# Patient Record
Sex: Male | Born: 1945 | Race: White | Hispanic: No | Marital: Married | Smoking: Former smoker
Health system: Southern US, Community
[De-identification: ages and names within clinical notes are randomized; demographics above are authoritative.]

## PROBLEM LIST (undated history)

## (undated) DIAGNOSIS — I10 Essential (primary) hypertension: Secondary | ICD-10-CM

## (undated) DIAGNOSIS — C801 Malignant (primary) neoplasm, unspecified: Secondary | ICD-10-CM

## (undated) DIAGNOSIS — Z720 Tobacco use: Secondary | ICD-10-CM

## (undated) DIAGNOSIS — E669 Obesity, unspecified: Secondary | ICD-10-CM

## (undated) DIAGNOSIS — N200 Calculus of kidney: Secondary | ICD-10-CM

## (undated) DIAGNOSIS — G459 Transient cerebral ischemic attack, unspecified: Secondary | ICD-10-CM

## (undated) DIAGNOSIS — E119 Type 2 diabetes mellitus without complications: Secondary | ICD-10-CM

## (undated) DIAGNOSIS — I472 Ventricular tachycardia: Secondary | ICD-10-CM

## (undated) DIAGNOSIS — M545 Low back pain: Secondary | ICD-10-CM

## (undated) DIAGNOSIS — D494 Neoplasm of unspecified behavior of bladder: Secondary | ICD-10-CM

## (undated) DIAGNOSIS — R001 Bradycardia, unspecified: Secondary | ICD-10-CM

## (undated) DIAGNOSIS — K219 Gastro-esophageal reflux disease without esophagitis: Secondary | ICD-10-CM

## (undated) DIAGNOSIS — G8929 Other chronic pain: Secondary | ICD-10-CM

## (undated) DIAGNOSIS — E785 Hyperlipidemia, unspecified: Secondary | ICD-10-CM

## (undated) DIAGNOSIS — I779 Disorder of arteries and arterioles, unspecified: Secondary | ICD-10-CM

## (undated) DIAGNOSIS — G9340 Encephalopathy, unspecified: Secondary | ICD-10-CM

## (undated) DIAGNOSIS — I69398 Other sequelae of cerebral infarction: Secondary | ICD-10-CM

## (undated) DIAGNOSIS — I2510 Atherosclerotic heart disease of native coronary artery without angina pectoris: Secondary | ICD-10-CM

## (undated) DIAGNOSIS — Z8719 Personal history of other diseases of the digestive system: Secondary | ICD-10-CM

## (undated) DIAGNOSIS — Z9289 Personal history of other medical treatment: Secondary | ICD-10-CM

## (undated) DIAGNOSIS — G40909 Epilepsy, unspecified, not intractable, without status epilepticus: Secondary | ICD-10-CM

## (undated) DIAGNOSIS — I639 Cerebral infarction, unspecified: Secondary | ICD-10-CM

## (undated) DIAGNOSIS — I739 Peripheral vascular disease, unspecified: Secondary | ICD-10-CM

## (undated) DIAGNOSIS — I2119 ST elevation (STEMI) myocardial infarction involving other coronary artery of inferior wall: Secondary | ICD-10-CM

## (undated) DIAGNOSIS — R000 Tachycardia, unspecified: Secondary | ICD-10-CM

## (undated) HISTORY — DX: Personal history of other medical treatment: Z92.89

## (undated) HISTORY — DX: Ventricular tachycardia: I47.2

## (undated) HISTORY — DX: Essential (primary) hypertension: I10

## (undated) HISTORY — DX: Tachycardia, unspecified: R00.0

## (undated) HISTORY — PX: CORONARY ANGIOPLASTY WITH STENT PLACEMENT: SHX49

## (undated) HISTORY — DX: Disorder of arteries and arterioles, unspecified: I77.9

## (undated) HISTORY — PX: TRANSURETHRAL RESECTION OF BLADDER TUMOR WITH GYRUS (TURBT-GYRUS): SHX6458

## (undated) HISTORY — DX: Tobacco use: Z72.0

## (undated) HISTORY — PX: CARDIAC CATHETERIZATION: SHX172

## (undated) HISTORY — DX: Neoplasm of unspecified behavior of bladder: D49.4

## (undated) HISTORY — DX: Epilepsy, unspecified, not intractable, without status epilepticus: G40.909

## (undated) HISTORY — DX: Obesity, unspecified: E66.9

## (undated) HISTORY — DX: Peripheral vascular disease, unspecified: I73.9

## (undated) HISTORY — DX: Other sequelae of cerebral infarction: I69.398

## (undated) HISTORY — DX: Type 2 diabetes mellitus without complications: E11.9

## (undated) HISTORY — DX: Hyperlipidemia, unspecified: E78.5

## (undated) HISTORY — DX: Gastro-esophageal reflux disease without esophagitis: K21.9

---

## 1992-01-13 DIAGNOSIS — I2119 ST elevation (STEMI) myocardial infarction involving other coronary artery of inferior wall: Secondary | ICD-10-CM

## 1992-01-13 HISTORY — DX: ST elevation (STEMI) myocardial infarction involving other coronary artery of inferior wall: I21.19

## 2000-02-02 ENCOUNTER — Encounter: Payer: Self-pay | Admitting: Cardiology

## 2000-02-02 ENCOUNTER — Inpatient Hospital Stay (HOSPITAL_COMMUNITY): Admission: AD | Admit: 2000-02-02 | Discharge: 2000-02-08 | Payer: Self-pay | Admitting: Cardiology

## 2000-11-11 ENCOUNTER — Ambulatory Visit (HOSPITAL_COMMUNITY): Admission: RE | Admit: 2000-11-11 | Discharge: 2000-11-11 | Payer: Self-pay | Admitting: *Deleted

## 2000-12-07 ENCOUNTER — Ambulatory Visit (HOSPITAL_COMMUNITY): Admission: RE | Admit: 2000-12-07 | Discharge: 2000-12-08 | Payer: Self-pay | Admitting: Cardiology

## 2003-01-18 ENCOUNTER — Inpatient Hospital Stay (HOSPITAL_COMMUNITY): Admission: EM | Admit: 2003-01-18 | Discharge: 2003-01-20 | Payer: Self-pay | Admitting: Emergency Medicine

## 2004-06-10 ENCOUNTER — Ambulatory Visit: Payer: Self-pay | Admitting: Cardiology

## 2005-03-11 ENCOUNTER — Inpatient Hospital Stay (HOSPITAL_COMMUNITY): Admission: EM | Admit: 2005-03-11 | Discharge: 2005-03-14 | Payer: Self-pay | Admitting: Emergency Medicine

## 2005-03-11 ENCOUNTER — Ambulatory Visit: Payer: Self-pay | Admitting: Cardiovascular Disease

## 2005-03-19 ENCOUNTER — Ambulatory Visit: Payer: Self-pay | Admitting: Cardiology

## 2005-05-28 ENCOUNTER — Ambulatory Visit: Payer: Self-pay | Admitting: Cardiology

## 2005-12-24 ENCOUNTER — Ambulatory Visit: Payer: Self-pay | Admitting: Cardiology

## 2006-04-13 ENCOUNTER — Encounter: Payer: Self-pay | Admitting: Internal Medicine

## 2006-04-13 ENCOUNTER — Ambulatory Visit: Payer: Self-pay

## 2006-04-26 ENCOUNTER — Ambulatory Visit: Payer: Self-pay | Admitting: Cardiology

## 2006-12-02 ENCOUNTER — Ambulatory Visit: Payer: Self-pay | Admitting: Cardiology

## 2007-07-07 ENCOUNTER — Ambulatory Visit: Payer: Self-pay | Admitting: Cardiology

## 2008-03-16 ENCOUNTER — Ambulatory Visit: Payer: Self-pay | Admitting: Cardiology

## 2008-03-16 ENCOUNTER — Encounter: Payer: Self-pay | Admitting: Cardiology

## 2008-03-16 DIAGNOSIS — Z794 Long term (current) use of insulin: Secondary | ICD-10-CM

## 2008-03-16 DIAGNOSIS — I2510 Atherosclerotic heart disease of native coronary artery without angina pectoris: Secondary | ICD-10-CM

## 2008-03-16 DIAGNOSIS — I219 Acute myocardial infarction, unspecified: Secondary | ICD-10-CM | POA: Insufficient documentation

## 2008-03-16 DIAGNOSIS — E785 Hyperlipidemia, unspecified: Secondary | ICD-10-CM

## 2008-03-16 DIAGNOSIS — E669 Obesity, unspecified: Secondary | ICD-10-CM

## 2008-03-16 DIAGNOSIS — D303 Benign neoplasm of bladder: Secondary | ICD-10-CM | POA: Insufficient documentation

## 2008-03-16 DIAGNOSIS — K219 Gastro-esophageal reflux disease without esophagitis: Secondary | ICD-10-CM

## 2008-03-16 DIAGNOSIS — I479 Paroxysmal tachycardia, unspecified: Secondary | ICD-10-CM

## 2008-03-16 DIAGNOSIS — E119 Type 2 diabetes mellitus without complications: Secondary | ICD-10-CM

## 2008-03-16 DIAGNOSIS — I10 Essential (primary) hypertension: Secondary | ICD-10-CM

## 2008-11-01 ENCOUNTER — Ambulatory Visit: Payer: Self-pay | Admitting: Cardiology

## 2009-02-06 ENCOUNTER — Telehealth: Payer: Self-pay | Admitting: Cardiology

## 2009-05-16 ENCOUNTER — Telehealth: Payer: Self-pay | Admitting: Cardiology

## 2009-09-12 ENCOUNTER — Telehealth: Payer: Self-pay | Admitting: Cardiology

## 2009-10-09 ENCOUNTER — Telehealth (INDEPENDENT_AMBULATORY_CARE_PROVIDER_SITE_OTHER): Payer: Self-pay | Admitting: *Deleted

## 2009-10-24 ENCOUNTER — Ambulatory Visit: Payer: Self-pay | Admitting: Cardiology

## 2009-10-24 DIAGNOSIS — G939 Disorder of brain, unspecified: Secondary | ICD-10-CM

## 2010-02-11 NOTE — Progress Notes (Signed)
Summary: Pt evaluated by Dr Adella Hare  Phone Note Call from Patient   Caller: Patient Reason for Call: Talk to Nurse Summary of Call: pt wants to talk to nurse re an order that he needs-pls call [phone] Initial call taken by: Glynda Jaeger,  September 12, 2009 3:52 PM  Follow-up for Phone Call        I spoke with the pt's wife and the pt did finally get evaluated for the symptoms he has been having for months.  The pt saw Dr Adella Hare this morning and an MRI was obtained that showed a small stroke.  The pt's wife called because Dr Adella Hare gave them an order to have labs drawn and she wanted to have them drawn in our office.  I made her aware that our office can only draw labs that are ordered by [facility] physicians.  She will call the PCP about having labs drawn.  I also scheduled the pt to see Dr Riley Kill 10/24/09.    Follow-up by: Julieta Gutting, RN, BSN,  September 12, 2009 4:43 PM

## 2010-02-11 NOTE — Progress Notes (Signed)
  Walk in Patient Form Recieved " Pt dropped off Questionaire from [facility]' sent to [facility] [place]  October 09, 2009 10:56 AM     Appended Document:  Questionaire completed,Rhonda Mailed copy to Pt I faxed copy to [place] in [facility].

## 2010-02-11 NOTE — Progress Notes (Signed)
Summary: Episode  Phone Note Call from Patient Call back at Home Phone [phone] Call back at [phone]   Caller: Spouse Reason for Call: Talk to Nurse Summary of Call: request call back from Lauren, aware she is not in office till 1/27 Initial call taken by: Migdalia Dk,  February 06, 2009 11:47 AM  Follow-up for Phone Call        I spoke with the pt's wife and the pt had an episode on Monday night that concerned her.  They went out to eat and the pt complained that his right hand was numb.  She said the pt got confused and started using words incorrectly.  She asked the pt questions but he could not answer the questions.  The pt was driving home during part of this episode and she made the pt pull over.  She did recommend that the pt go to the ER for evaluation but he refused.  The episode lasted 15 minutes.  Since that episode the pt has been doing fine.  I asked Mrs Savidge to contact the pt's Primary MD Dr Lorin Picket for an appt.  She agreed with plan.  I also told her to take the pt to the ER if he had any further symptoms.   Follow-up by: Julieta Gutting, RN, BSN,  February 07, 2009 10:43 AM

## 2010-02-11 NOTE — Progress Notes (Signed)
Summary: Episode  Phone Note Call from Patient Call back at Home Phone [phone]   Caller: Spouse Reason for Call: Talk to Nurse Summary of Call: pt had another episode where he couldn't speak Initial call taken by: Migdalia Dk,  May 16, 2009 4:58 PM  Follow-up for Phone Call        I spoke with the pt's wife and she said the pt had another episode last Friday at work when he could not speak.  She said the pt is becoming more forgetful and having frequent headaches.  I asked her if the pt followed up with his PCP after our conversation in January and she said no.  I once again advised her that the pt needs to be evaluated ASAP for these symptoms.  I told her to call Dr Roby Lofts office tomorrow about the pt's symptoms.  I also told her to go to an Urgent Care or ER if the pt did not want to see his PCP. She will attempt to have the pt go to the Urgent Care for evaluation.  She said the pt keeps avoiding having an evaluation because his symptoms go away.   I once again advised her of the importance of evaluation of these symptoms.  Follow-up by: Julieta Gutting, RN, BSN,  May 16, 2009 5:37 PM

## 2010-02-11 NOTE — Assessment & Plan Note (Signed)
Summary: ROV   Visit Type:  Follow-up Primary Provider:  Lorin Picket  CC:  Patient had Stroke on 02-04-09.  History of Present Illness: Came out of a store, and could not get words out correctly.  Then he had two short episodes at work.  He would not go to the hospital, and then he saw Dr. Lorin Picket.  He went to see neurologist.  Carotid dopplers were ok.  Echo on heart, apparently did not show clots.  He was taken of of baby asa and was placed on PLavix.  He has not had any more episodes since then.  Has had one episode of tightness, and a fair amount of stress. We discussed medications today, because the [place] can give him Aggrenox.  Now on insulin.  A1c is 8.4%.    Current Medications (verified): 1)  Isosorbide Mononitrate Cr 30 Mg Xr24h-Tab (Isosorbide Mononitrate) .... Take 1 Tablet By Mouth Once A Day 2)  Vytorin 10-40 Mg Tabs (Ezetimibe-Simvastatin) .... Take 1 Tablet By Mouth Once A Day At Bedtime 3)  Metformin Hcl 850 Mg Tabs (Metformin Hcl) .... Take 1 Tablet By Mouth Three Times A Day 4)  Protonix 40 Mg Tbec (Pantoprazole Sodium) .... Take 1 Tablet By Mouth Once A Day 5)  Glimepiride 4 Mg Tabs (Glimepiride) .... Take 1 1/2 Tab P.o. Once Daily 6)  Carvedilol 12.5 Mg Tabs (Carvedilol) .... Take One Tablet By Mouth Twice A Day 7)  Triamcinolone Acetonide 0.1 % Crea (Triamcinolone Acetonide) .... Apply Up Twice Daily As Needed 8)  Plavix 75 Mg Tabs (Clopidogrel Bisulfate) .... Take One Tablet By Mouth Daily 9)  Levemir Flexpen 100 Unit/ml Soln (Insulin Detemir) .... 26 Units Daily  Allergies: 1)  ! * Microbid  Vital Signs:  Patient profile:   65 year old male Height:      66 inches Weight:      193.25 pounds BMI:     31.30 Pulse rate:   57 / minute Pulse rhythm:   regular Resp:     18 per minute BP sitting:   120 / 70  (left arm) Cuff size:   large  Vitals Entered By: Vikki Ports (October 24, 2009 11:20 AM)  Physical Exam  General:  Well developed, well nourished, in no acute  distress. Head:  normocephalic and atraumatic Eyes:  PERRLA/EOM intact; conjunctiva and lids normal. Neck:  no carotid bruits. Lungs:  Clear bilaterally to auscultation and percussion. Heart:  PMI non displaced.  Normal S1 and S2.  Pos S4.   Abdomen:  Bowel sounds positive; abdomen soft and non-tender without masses, organomegaly, or hernias noted. No hepatosplenomegaly. Extremities:  No clubbing or cyanosis. Neurologic:  Alert and oriented x 3.   EKG  Procedure date:  10/24/2009  Findings:      NSR.  Inferior MI, old.  T inversion in AVL, I but not changed from last tracing.   Impression & Recommendations:  Problem # 1:  CAD, NATIVE VESSEL (ICD-414.01)  Seems stable. Has non DES, due to bladder cancer.  Symptoms controlled.  The following medications were removed from the medication list:    Aspirin 81 Mg Tbec (Aspirin) .[name]... Take one tablet by mouth daily His updated medication list for this problem includes:    Isosorbide Mononitrate Cr 30 Mg Xr24h-tab (Isosorbide mononitrate) .[name]... Take 1 tablet by mouth once a day    Carvedilol 12.5 Mg Tabs (Carvedilol) .[name]... Take one tablet by mouth twice a day    Plavix 75 Mg Tabs (Clopidogrel bisulfate) .Marland KitchenMarland KitchenMarland KitchenMarland Kitchen  Take one tablet by mouth daily    Nitrostat 0.4 Mg Subl (Nitroglycerin) .[name]... 1 tablet under tongue at onset of chest pain; you may repeat every 5 minutes for up to 3 doses.  Orders: EKG w/ Interpretation (93000)  Problem # 2:  TIA (ICD-435.9) Discussed switch to Aggrenox, and Dr. Adella Hare told him that would be ok.  Problem # 3:  HYPERLIPIDEMIA-MIXED (ICD-272.4) remains on combo treatment.  Follow up with Dr. Lorin Picket. His updated medication list for this problem includes:    Vytorin 10-40 Mg Tabs (Ezetimibe-simvastatin) .[name]... Take 1 tablet by mouth once a day at bedtime  Patient Instructions: 1)  Your physician recommends that you continue on your current medications as directed. Please refer to the Current Medication list  given to you today. 2)  Your physician wants you to follow-up in:  6 MONTHS.  You will receive a reminder letter in the mail two months in advance. If you don't receive a letter, please call our office to schedule the follow-up appointment. Prescriptions: NITROSTAT 0.4 MG SUBL (NITROGLYCERIN) 1 tablet under tongue at onset of chest pain; you may repeat every 5 minutes for up to 3 doses.  #25 x 2   Entered by:   Julieta Gutting, RN, BSN   Authorized by:   Ronaldo Miyamoto, MD, [facility]   Signed by:   Julieta Gutting, RN, BSN on 10/24/2009   Method used:   Electronically to        [facility]  [address] [id number] (retail)       [address]       [place], [place]  [address]       Ph: [phone] or [phone]       Fax: [phone]   RxID:   [id number]

## 2010-05-27 NOTE — Letter (Signed)
December 02, 2006    Vickii Penna, MD  [address]  [place], [place]  [address]   RE:  Carl Stephenson, Carl Stephenson  MRN:  [id number]  /  DOB:  December 01, 1945   Dear Nadine Counts:   I had the pleasure of seeing [name] in the office today in a  follow-up visit.  Cardiac-wise he seems to be getting along extremely  well.  He has not been having any chest pain or progressive shortness of  breath.  I do know that he has recently seen you, and his wife showed me  his laboratory studies today and are scheduled to follow up with you in  the near future.  This demonstrated a borderline low hemoglobin with an  MCV of 78, and I did discuss with him the fact that he has never had a  colonoscopy in the past.   Today on physical the blood pressure is 130/66 and the pulse is 64.  The lung fields are clear.  The cardiac rhythm is regular.  I do not appreciate a significant  murmur.   EKG reveals sinus rhythm with evidence of an inferior infarct of  indeterminate age.  There is delay in R-wave progression.   This gentleman continues to do well from a cardiac standpoint.  As you  know, he has noninsulin-dependent diabetes mellitus.  He is at risk for  future cardiac events.  Optimization of his lipid status would be ideal.  We will see him back in follow-up in approximately 6 months, but I would  be happy to see him sooner if further problems arise.  Thanks for  allowing me to share in his care.    Sincerely,      Arturo Morton. Riley Kill, MD, [facility]  Electronically Signed    TDS/MedQ  DD: 12/02/2006  DT: 12/03/2006  Job #: [id number]

## 2010-05-27 NOTE — Letter (Signed)
July 07, 2007    Lucila Maine, MD  [address]  [place], [place] [address]   RE:  SLOANE, JUNKIN  MRN:  [id number]  /  DOB:  1945/03/27   Dear Nadine Counts,   I had the pleasure of seeing Yoshimi Sarr in the office today in  followup.  Clinically, he is doing well.  He has not been having any  chest pain or shortness of breath.  He feels quite good.  He is  scheduled to see his urologist for a possible cystoscopy in the next few  weeks.  Overall, he has done well from a medication standpoint, although  I do understand that his hemoglobin A1c has been somewhat higher.   Today on examination, the blood pressure is 126/78, the pulse is 67.  The lung fields are clear.  Cardiac, rhythm is regular.   Overall, I am quite pleased with how he has done from a clinical  standpoint.  I would make no new recommendations.  His electrocardiogram  today demonstrates a age-undetermined inferior infarction with delayed R-  wave progression compatible with age-undetermined anterior infarction,  but his EKG is unchanged from previous tracings.   I doubt that he has had a prior anterior infarction and clinically this  is certainly not the case.   Thanks for allowing me the share in his care and we will see him back in  6 months time.    Sincerely,      Arturo Morton. Riley Kill, MD, [facility]  Electronically Signed    TDS/MedQ  DD: 07/07/2007  DT: 07/08/2007  Job #: [id number]

## 2010-05-30 NOTE — Letter (Signed)
May 06, 2006     RE:  ARSAL, TAPPAN  MRN:  [id number]  /  DOB:  Feb 09, 1945   To Whom It May Concern:   Mr. Gabay is a patient under my care.  I have followed him for a  number of years since he had a myocardial infarction.  He subsequently  has developed a clinical diagnosis of diabetes mellitus.  It is  important to recognize that even at the time of his original  presentation, he had hypertriglyceridemia.  From what we currently know,  it is likely that his underlying cardiac condition is clearly associated  with a developing diabetic condition.  As you know, manifestations of  hyperglycemia are often a late finding with insulin resistance being a  very early finding as manifest in the underlying lipid profile.  Therefore, it is likely, as with many of our coronary artery patients  who subsequently develop the diagnosis of diabetes mellitus, that  underlying prediabetes and diabetes were present and longstanding if  they are reclassified as a case of insulin resistance.   The two are therefore are, in my opinion, intimately related, and it is  not unusual for many young males who present with acute cardiac disease  and have extensive coronary artery disease to have a prediabetic  condition that truly represents longstanding insulin resistance.  I  therefore believe the two are related as it relates to his veteran's  application.    Sincerely,      Arturo Morton. Riley Kill, MD, [facility]  Electronically Signed    TDS/MedQ  DD: 05/06/2006  DT: 05/06/2006  Job #: [id number]

## 2010-05-30 NOTE — Cardiovascular Report (Signed)
. [facility]  Patient:    Carl Stephenson, Carl Stephenson Visit Number: [id number] MRN: [id number]          Service Type: CAT Location: [id number] Attending Physician:  Ronaldo Miyamoto Dictated by:   Arturo Morton Riley Kill, M.D. [facility] Proc. Date: 12/07/00 Admit Date:  12/07/2000   CC:         Lucila Maine, M.D.             CV Laboratory                        Cardiac Catheterization  INDICATIONS: The patient is well known to me. He has had a prior inferior infarction. He has had previous stenting of the proximal and distal right coronary artery and had irregularity in the mid right. On the most recent catheterization, this looked slightly worse, but was in a complicated bend and we therefore elected to recommend medical therapy. We did elect to do a repeat Cardiolite which demonstrated more prominent ischemia from the previous study. The patient has had an additional episode of chest discomfort recently for which he was relieved with nitroglycerin. After discussing all the possible risks and benefits we elected to proceed on with percutaneous intervention. Diagnostic catheterization had been done three weeks earlier.  PROCEDURE PERFORMED: Percutaneous stenting of the mid right coronary.  DESCRIPTION OF PROCEDURE: The patient was brought to the catheterization lab and prepped and draped in the usual fashion. Through an anterior puncture the right femoral artery was easily entered and a 7 French sheath placed. Heparin was given according to protocol and the ACT was in excess of 200 seconds. We used a JR4 guiding catheter with side holes and crossed with a 0.014 luge wire. The vessel was then directly stented using a 28 mm length by 3.0 mm width Express II stent. This was taken up to about 11-12 atmospheres and had very nice apposition throughout the mid vessel. There was still moderate irregularity in the bend where the worse portion of the lesion was.  This entire area was post-dilated using a 3.5 Quantum Maverick balloon which was taken up to about 15 atmospheres. Despite continued very mild luminal irregularity there was good apposition of the stent and coverage throughout. All catheters were subsequently removed, the femoral sheath sewn into place and the patient taken to the holding area in satisfactory clinical condition.  HEMODYNAMIC DATA: Central aortic pressure 130/74 with a mean of 98.  ANGIOGRAPHIC DATA: The right coronary artery demonstrates very mild luminal irregularity proximally. Just beyond this, there is a previously stented area which remains widely patent. In the mid vessel was about 70-80% eccentric narrowing that has progressed from the previous study and has an invagination and fold in the inferior portion of the lesion. This is in the first steep bend in the mid right coronary. Just beyond this is an area of moderate plaquing. Distally, there is mild luminal irregularity at the previous stent site and a previously noted lesion in the posterolateral segment. Following stenting of the mid vessel this is reduced to about 10% residual luminal narrowing with a widely patent stent.  CONCLUSIONS: Successful percutaneous stenting of the mid right coronary artery.  DISPOSITION: The patient will continue to be treated medically. Primary care followup will be with Dr. Lorin Picket and cardiac followup with Dr. Riley Kill. Dictated by:   Arturo Morton Riley Kill, M.D. [facility] Attending Physician:  Ronaldo Miyamoto DD:  12/07/00 TD:  12/07/00 Job:  [id number]

## 2010-05-30 NOTE — Letter (Signed)
December 24, 2005    Buford Dresser  [address]  [place],  [place]  [address]   RE:  CHRISTOFER, SHEN  MRN:  [id number]  /  DOB:  1945/08/26   To Whom it May Concern:   Mr. Wilms has been under my care since 1994.  At that time he  presented with an inferior wall myocardial infarction, and was treated  with primary percutaneous coronary angioplasty.  He underwent repeat  intervention on 2002, and had electrophysiologic study at that time as  well for ventricular arrhythmias.  In 2005, he underwent percutaneous  stenting of a ramus intermedius vessel.  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  He  remains under my care, and on lipid lowering agents, and he is also on a  diabetic regimen administered by his primary care physician.   Mr. Kreuzer has underlying diabetes mellitus which has contributed  significantly to his progressive coronary artery disease.   Thank you for your attention into this matter.    Sincerely,      Arturo Morton. Riley Kill, MD, [facility]  Electronically Signed    TDS/MedQ  DD: 12/24/2005  DT: 12/24/2005  Job #: [id number]

## 2010-05-30 NOTE — Discharge Summary (Signed)
Carl Stephenson, Carl Stephenson             ACCOUNT NO.:  [id number]   MEDICAL RECORD NO.:  [id number]          PATIENT TYPE:  INP   LOCATION:  6525                         FACILITY:  [facility]   PHYSICIAN:  Rollene Rotunda, M.D.   DATE OF BIRTH:  [birth year]   DATE OF ADMISSION:  03/11/2005  DATE OF DISCHARGE:  03/14/2005                                 DISCHARGE SUMMARY   PRINCIPAL DIAGNOSIS:  Unstable angina/coronary artery disease.   SECONDARY DIAGNOSIS:  1.  Ischemic cardiomyopathy with an ejection fraction of 41%.  2.  History of wide complex tachycardia.   OTHER DIAGNOSES:  1.  Pre-syncope.  2.  Hypertension.  3.  Hyperlipidemia.  4.  Type 2 diabetes mellitus.  5.  Gastroesophageal reflux disease.  6.  Obesity.  7.  History of recurrent bladder tumors, status post surgery approximately 1      month ago.   ALLERGIES:  MACROBID.   PROCEDURES:  1.  Left heart cardiac catheterization.  2.  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   HISTORY OF PRESENT ILLNESS:  A 65 year old white male with prior history of  coronary artery disease, status post inferior myocardial infarction in 1994  with stenting of the RCA on 2 occasions, most recently stenting of the ramus  in 2005. Carl Stephenson has been experiencing intermittent chest pain over the  past couple of months and on March 11, 2005, had a more severe episode  relieved with nitroglycerin and subsequently associated with pre-syncope,  prompting him to present to the emergency room. In the emergency department,  he received 2 additional nitroglycerin with complete relief of discomfort.  His per-syncope resolved. His ECG showed sinus rhythm with inferior Q's,  poor R-wave progression, and lateral T wave inversion. This was unchanged  from previous. Decision was made to admit him for evaluation.   HOSPITAL COURSE:  Carl Stephenson ruled out for myocardial infarction by cardiac  markers x3. 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 He, otherwise, had non-obstructive disease in the LAD and RCA.  His EF was 41% with infra-basal hypokinesis and akinesis. He then underwent  successful PCI and stenting of the left circumflex with a 2.75 x 28 mm multi-  link Vision Baer metal stent. He tolerated this procedure well and post  procedure, his cardiac markers remained negative and ECG stable. He has been  ambulating in the hallways without recurrent discomfort or limitations and  is being discharged home this afternoon in satisfactory condition.   LABORATORY DATA:  On discharge, hemoglobin 13.3 and hematocrit 38.5. WBC  6.7. Platelets 176,000. MCV 77.4. Sodium 137, potassium 3.9, chloride 106,  CO2 25, BUN 6, creatinine 0.9, glucose 197. Total bilirubin 0.8, alkaline  phosphatase 74, AST 19, ALT 13, albumin 4.0. Cardiac markers negative x3  with post-procedure CK of 27, MB of 1.0, troponin I of 0.03, total  cholesterol 115, triglycerides 513, HDL 29, LDL not calculated. Calcium 8.2.  Hemoglobin A1C 8.8.  TSH 1.228.   DISPOSITION:  The patient is being discharged home today in good condition.   FOLLOW UP:  1.  He has a followup appointment with Dr. Rosalyn Charters nurse practitioner or      physician assistant on March 19, 2005 at 3:00 p.m.  2.  He is asked to followup with Dr. Lorin Picket in the next 3 to 4 weeks.   DISCHARGE MEDICATIONS:  1.  Aspirin 325 mg daily.  2.  Plavix 75 mg daily.  3.  Coreg 12.5 mg b.i.d.  4.  Starlix 120 mg t.i.d.  5.  Protonix 40 mg daily.  6.  Vytorin 10/40 mg q.h.s.  7.  Isosorbide mononitrate 30 mg daily.  8.  Glucophage 1,000 mg t.i.d., to be resumed March 15, 2005.  9.  Nitroglycerin 0.4 mg sublingual p.r.n. chest pain.   OUTSTANDING LABORATORY STUDIES:  None.  .   DURATION OF DISCHARGE ENCOUNTER:  40 minutes including physician time.      Ok Anis, NP    ______________________________  Rollene Rotunda, M.D.    CRB/MEDQ  D:  03/14/2005  T:  03/15/2005  Job:  [id number]   cc:   Arturo Morton. Riley Kill, M.D. [facility]  [address]  [address]  [place]  [place] [address]   Lucila Maine, M.D.

## 2010-05-30 NOTE — Cardiovascular Report (Signed)
[place]. [facility]  Patient:    Carl Stephenson, Carl Stephenson                    MRN: [id number] Proc. Date: 02/03/00 Adm. Date:  [id number] Attending:  Ronaldo Miyamoto CC:         Lucila Maine, M.D., [place]             Cath Lab                        Cardiac Catheterization  INDICATIONS:  [name] is a 65 year old well-known to me.  He has had prior percutaneous intervention of the right coronary artery after an acute inferior infarction in November of 1994.  At that point, the 90% stenosis was reduced to less than 20% residual luminal narrowing.  There was a good angiographic response and this was in 1994.  In the interim, he has done reasonably well, but yesterday had slightly more ischemia on a Cardiolite study, and also had exercise-induced ventricular arrhythmia.  As a result, he was referred back for cardiac catheterization.  PROCEDURE: 1. Left heart catheterization. 2. Selective coronary arteriography. 3. Selective left ventriculography.  DESCRIPTION OF PROCEDURE:  The procedure is performed from the right femoral artery using 6 French catheters.  He tolerated the procedure without complications.  We elected not to proceed on with percutaneous intervention largely due to the contrast load.  HEMODYNAMIC DATA:  Central aortic pressure was 115/68, LV pressure 117/12. There was 2 mm gradient on pullback across the aortic valve.  ANGIOGRAPHIC DATA:  The left main coronary artery demonstrates very minimal irregularity.  The LAD courses to the apex.  There is one diagonal and one septal perforator. The LAD just beyond the diagona in the mid to distal vessel has about a 50% stenosis just beyond the bifurcation.  The circumflex provides a small ramus vessel that is free of critical disease. There is a large marginal branch that has about 20% narrowing prior to its bifurcation distally.  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  There was minimal irregularity of this vessel, but no significant focal narrowing.  The right coronary artery demonstrates progression of disease with an 80% proximal stenosis near the junction of the proximal and midvessel.  The midvessel with a 60 to 70% area of narrowing is unchanged.  There is an 80 to 90% area of narrowing distally prior to the origin of the posterior descending branch.  This is in the previous site of angioplasty in 1994 and this now demonstrates 80 to 90% narrowing.  Ventriculography in the RAO projection reveals inferobasal hypokinesis, ejection fraction is 46%.  There is minimal mitral regurgitation.  IMPRESSION: 1. Preserved overall LV function with an inferobasal wall motion abnormality    as on previous studies. 2. Progression of disease in the right coronary artery as described in the    above text.  DISPOSITION:  We will recommend percutaneous intervention of the right coronary artery with two and possibly three stents.  I will review the films with my colleagues.  Because of his diabetic status, this will be delayed for volume loading and recovery from the contrast load.  In addition, he will be pretreated with Plavix.  EP evaluation will also be obtained. DD:  02/03/00 TD:  02/03/00 Job: [id number]

## 2010-05-30 NOTE — Discharge Summary (Signed)
[place]. [facility]  Patient:    Carl Stephenson, Carl Stephenson Visit Number: [id number] MRN: [id number]          Service Type: CAT Location: 3700 [id number] Attending Physician:  Ronaldo Miyamoto Dictated by:   Pennelope Bracken, N.P. Admit Date:  12/07/2000 Discharge Date: 12/08/2000   CC:         Dr. Lucila Maine   Discharge Summary  DATE OF BIRTH:  July 24, 1945  REASON FOR ADMISSION:  Left chest tightness and abnormal Cardiolite study.  DISCHARGE DIAGNOSES: 1. Coronary artery disease, progressive, in patients right coronary artery. 2. History of diaphragmatic myocardial infarction in 1994, treated with    angioplasty of the right coronary artery.  Subsequent stent placement to    proximal and distal right coronary artery performed February 2002. 3. Cardiac catheterization performed on October 31, showing ejection fraction    50%, 60% stenosis of the left anterior descending, 70% septal perforator    with abnormal Cardiolite study on November 11, showing inferolateral scar    and peri-infarct ischemia. 4. Diabetes mellitus type 2. 5. Hyperlipidemia. 6. History of tobacco abuse. 7. 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  HISTORY OF PRESENT ILLNESS:  This delightful, 65 year old gentleman is well-known to Dr. Riley Kill.  He presented recently with history of progressive left-sided chest pain with exertion.  He denied any associated symptoms, but the chest pain was severe enough to finally require nitroglycerin which provided relief.  Given his history as noted above, he was noted for reevaluation.  HOSPITAL COURSE:  The patient was brought to the catheterization lab where angiography revealed a 70-80% stenotic lesion in the mid RCA with moderate luminal irregularities.  Stent was placed in this area reducing the stenosis to less than 10% and poststent dilation was performed with a 3.5 Guardian-Maveric balloon.  This reduced the stenosis in the  tortuous area also to less than 10%.  The patient was returned to the floor in good condition and recovered without incident.  He was followed with serial labs and telemetry. He continued to do well.  DISCHARGE PHYSICAL EXAMINATION:  VITAL SIGNS:  Blood pressure 115/70, pulse 60, O2 saturations 98% on room air.  GENERAL:  The patient was without chest pain tightness, dyspnea or palpitations.  He was alert and oriented.  LUNGS:  Clear to auscultation bilaterally.  HEART:  Regular rate and rhythm with 1/6 systolic ejection murmur heard.  S1, S2 clear.  ABDOMEN:  Benign.  EXTREMITIES:  Without clubbing, cyanosis, or edema.  The patients right femoral arterial stick site had minimal ecchymosis and no bruit.  DISCHARGE LABORATORY DATA AND X-RAY FINDINGS:  Hemoglobin 12.9, hematocrit 36.1, white blood cells 5.7, platelets 172.  Sodium 139, potassium 3.9, chloride 105, CO2 25, BUN 11, creatinine 0.8, glucose 164.  The patients PT is 12.2 and INR 0.9, PTT 32.  CK is 26.  CK-MB 1.2.  A 12-lead EKG at discharge revealed normal sinus rhythm to sinus bradycardia with rate 60.  DISPOSITION:  The patient is discharged to home on continued medical treatment.  DISCHARGE MEDICATIONS: 1. Aspirin 325 mg one q.d. 2. Plavix 75 mg one q.d. 3. Gemfibrozil 600 mg one 30 minutes before breakfast and 30 minutes before    dinner. 4. Imdur 30 mg one q.d. 5. Pepcid 20 mg b.i.d. 6. Vioxx 25 mg one q.d. 7. Glucophage 500 mg t.i.d. with food.  The patient will restart this on    Friday. 8. Nitroglycerin sublingual spray 0.4 mg  one every five minutes as directed    for chest pain not to exceed three doses.  ACTIVITY:  The patient will avoid strenuous activity and tub baths for two days.  DIET:  As advised by diabetic coordinator.  The patient has a history of compliance and acknowledges his need to pay attention to his diet.  WOUND CARE:  The patient is to call the office if groin wound becomes hard  or painful.  FOLLOWUP:  The patient will follow up with Dr. Riley Kill on December 5, as scheduled. Dictated by:   Pennelope Bracken, N.P. Attending Physician:  Ronaldo Miyamoto DD:  12/08/00 TD:  12/08/00 Job: [id number]

## 2010-05-30 NOTE — Cardiovascular Report (Signed)
[place]. [facility]  Patient:    Carl Stephenson, Carl Stephenson                    MRN: [id number] Proc. Date: 02/06/00 Adm. Date:  [id number] Attending:  Ronaldo Stephenson CC:         CV Laboratory  Carl Stephenson, M.D.   Cardiac Catheterization  INDICATIONS:  Carl Stephenson is a 65 year old white male well known to me.  He had a previous myocardial infarction in 1994 and underwent percutaneous intervention.  He subsequently has done relatively well but has diabetes.  He recently underwent an exercise tolerance test which demonstrated inferior scar with peri-infarction ischemia and exercised-induced wide complex tachycardia.  Dr. Graciela Stephenson was unsure whether this represented aberration or indeed a wide complex rhythm.  He underwent repeat cardiac catheterization which demonstrated progression of disease distally at the previous site of angioplasty.  There was also a posterolateral lesion, a mid vessel lesion, and a fairly high-grade lesion at the proximal junction of the proximal and mid vessel.  After a thorough discussion with Dr. Graciela Stephenson, it was our feeling that the patient should have percutaneous intervention followed by exercise testing on beta blockade.  The current study was done after explanation to the patient and his family and the risks and benefits.  PROCEDURE: 1. Percutaneous stenting of the proximal right coronary artery. 2. Percutaneous stenting of the distal right coronary artery.  CARDIOLOGIST:  Carl Stephenson. Carl Stephenson, M.D. [facility]  DESCRIPTION OF THE PROCEDURE:  The patient was brought to the catheterization lab and prepped and draped in the usual fashion.  Through an anterior puncture, the left femoral artery was entered.  A 7-French sheath was placed. Heparin was given according to protocol, and an ACT was checked.  Integrilin was then administered in a double bolus fashion.  A JR4 guiding catheter, 7-French with side holes, was utilized to intubate the  right coronary artery. A high-torque floppy 0.014 wire was then manipulated down the right coronary artery and into the distal vessel.  We then directly stented the distal lesion using a 2.5 mm x 15 mm NIR stent.  This was taken up to about 10 atmospheres with good deployment, and then the balloon was pulled back very slightly and taken up to 14 atmospheres.  There was marked improvement in the appearance of the artery with nice transition into the disease area and good expansion of the stent.  Attention was then turned to the more proximal lesion.  Near the junction of the proximal and mid vessel, the lesion was directly stented using a 3 mm x 15 mm length of stent.  This was taken up to about 15 to 16 atmospheres.  There was good deployment of the stent, and then it was post dilated using a 3.75 mm x 9 Quantum Range balloon.  With this, there was some suggestion of hangup of contrast just proximal to the stent.  We were concerned about this area.  I reviewed the entire film with Dr. Juanda Chance.  We also discussed dilatation of the mid lesion.  I elected not to dilate the mid lesion, but I did place a second stent just slightly proximal to the previously placed proximal stent.  A 3.6 mm AVE stent was placed using a buddy wire system to get this stent telescoped into the previously placed stent.  This was taken up to about 16 to 17 atmospheres with good deployment, and then the balloon was advanced in the transition area  between the two stents, dilated with high pressure as well. At the completion of the procedure, there was very slight contrast hangup in the vessel. The patient was without chest pain or significant ST depression, and runoff was adequate; and, therefore, he was taken to the holding area in satisfactory condition.  HEMODYNAMIC DATA: The central aortic pressure was 114/70.  ANGIOGRAPHIC DATA:  Near the junction of the proximal and mid vessel, there is an area of stenosis of  about 80%.  In the mid vessel, there is a stenosis of about 50% just beyond the right ventricular branch.  However, the residual lumen appears to be in excess of 2.2 mm.  Distally, there is another 80% stomatosis just prior to the origin of the posterior descending branch. Following stenting, the distal lesion is reduced to 0 to 10% luminal reduction with a good angiographic appearance.  The more proximal lesion was reduced from 80% also to about 0 to 10%.  There was a posterolateral branch which had about 50 to 70% narrowing in a bend and a second 70% stenosis in a second bend of that artery.  We elected not to dilate this particular area.  CONCLUSIONS: 1. Successful stenting of the proximal right coronary artery as described    above. 2. Successful stenting of the distal right coronary artery as described    above.  DISPOSITION:  The patient will be kept for 48 hours.  He will be given Integrilin.  He is on aspirin and Plavix.  We will do repeat exercise testing to reassess his wide complex rhythm associated with exercise, and this will be done on beta blockade. DD:  02/06/00 TD:  02/06/00 Job: [id number]

## 2010-05-30 NOTE — Discharge Summary (Signed)
[place]. [facility]  Patient:    EWALD, BEG                    MRN: [id number] Adm. Date:  [id number] Disc. Date: 02/08/00 Attending:  Ronaldo Miyamoto Dictator:   Dian Queen, P.A.C. [facility] CC:         Robert B. Lorin Picket, M.D., Rosalita Levan, [place]   Referring Physician Discharge [place]  HISTORY OF PRESENT ILLNESS:  Mr. Vallee is a 65 year old, white, married male with prior percutaneous intervention of his RCA after an acute DMI in November of 1994.  Subsequent to that, he did quite well, but earlier this month he had slightly more ischemia on a Cardiolite study, as well as exercise-induced ventricular arrhythmia and it was felt that cardiac catheterization was indicated.  This was done on February 03, 2000, by Arturo Morton. Riley Kill, M.D.  He was noted to have an 80% proximal, a 70% mid, and an 80-90% distal RCA, as well as a 50% LAD after the DX2 and a 20% circumflex.  His LV function was preserved at 46% with some inferior hypokinesis.  Based on this anatomy, it was felt that intervention was indicated.  Because of his ventricular arrhythmias, Nathen May, M.D., F.A.C.C., was consulted by Arturo Morton. Riley Kill, M.D.  Dr. Riley Kill felt that the "VT" was actually aberration and recommendation beta blockers and PCI of the RCA to treat the ischemia.  HOSPITAL COURSE:  The patient was begun on Plavix and brought back from a leave of absence of February 06, 2000.  Dr. Riley Kill then proceed to sent the distal lesion from 80% to 0% and the proximal lesion from 80% to 0%.  A second stent was placed at proximal lesion for a possible edge tear.  The mid 50% lesion and the 70-75% posterolateral lesion were not dilated.  He had no chest pain or changes.  He was kept one day and discharged home the following day.  He tolerated the procedure well.  LABORATORY DATA:  Electrolytes and renal function were normal. Hemoglobin 12.2, hematocrit 34.6, white count 5900.  CKs were  low with negative MBs.  The troponin was 0.05.  FINAL DIAGNOSES: 1. Coronary artery disease with remote diaphragmatic myocardial infarction    treated with percutaneous coronary intervention in 1994.  Recent ischemia    noted on Cardiolite study after which he was catheterized and found to have    multiple lesions as outlined above.  His proximal and distal right coronary    artery lesions were stented this admission without complications. 2. Diabetes. 3. Wide complex tachycardia.  Reviewed with Nathen May, M.D.,    F.A.C.C., who felt the arrhythmia to be an aberration and to treat with beta    blockade and percutaneous coronary intervention of the right coronary    artery as above.  Will then restress him with beta blockers on board.  DISPOSITION:  The patient was discharged home.  DISCHARGE MEDICATIONS: 1. Nitroglycerin p.r.n. 2. Aspirin one daily. 3. Plavix 75 mg q.d. for one month. 4. Lopid 600 mg b.i.d. 5. Metoprolol 50 mg b.i.d. 6. Imdur 30 mg q.d. 7. Zantac as prior to admission. 8. Detrol LA q.d. 9. Glucophage 1000 mg t.i.d.  FOLLOW-UP:  He will follow up in the office in a couple of weeks.  Will confer Arturo Morton. Riley Kill, M.D., regarding time of the treadmill.  May do it in two weeks at the first office visit. DD:  02/08/00 TD:  02/08/00  Job: [id number]

## 2010-05-30 NOTE — Cardiovascular Report (Signed)
NAMEGARDNER, SERVANTES             ACCOUNT NO.:  [id number]   MEDICAL RECORD NO.:  [id number]          PATIENT TYPE:  INP   LOCATION:  6525                         FACILITY:  [facility]   PHYSICIAN:  Arturo Morton. Riley Kill, M.D. [facility] OF BIRTH:  07-24-1945   DATE OF PROCEDURE:  03/13/2005  DATE OF DISCHARGE:                              CARDIAC CATHETERIZATION   INDICATIONS:  Mr. Henriques is a delightful 65 year old gentleman well known  to me.  He has had recurrent bladder cancer.  He also has diabetes.  He has  had recurrent coronary artery disease and has had stenting of both the  intermediate coronary artery as well as the right coronary artery.  Today,  he had an episode of near presyncope and prolonged chest pain.  He was  admitted, his enzymes were unremarkable.  The current study was done to  assess coronary anatomy.   PROCEDURE:  1.  Left heart catheterization.  2.  Selective coronary territory.  3.  Selective left ventriculography.  4.  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   DESCRIPTION OF PROCEDURE:  The patient was brought to the catheterization  laboratory, prepped and draped in usual fashion.  He had an intertriginous  rash, we selected to enter above this area in the right femoral area but it  was still under the inguinal ligament.  Through an anterior puncture, the  right femoral artery was easily entered. A 6 French sheath was then placed.  Following this, views of left and right coronary arteries were obtained.  We  then used a right bypass catheter to engage the anomalous circumflex.  Ventriculography was performed in the RAO projection.  Ejection fraction was  41%.  Importantly, the patient had evidence of what appeared to be a  progressed and possibly ruptured plaque in the proximal portion of the  anomalous circumflex.  The previously placed stents in the right and  intermediate vessels were widely patent.  With this, we elected to  recommend  percutaneous intervention.  We specifically chose a non drug-eluting stent.  Despite his diabetes, he has not had in-stent restenosis in the previously  placed stents, and with this and with a history of bladder cancer with  recurrent bladder tumors, it was felt that an attempt at percutaneous  intervention using a non DES was appropriate.  This was discussed with the  patient.  We used Bivalirudin.  Oral clopidogrel was given but then a 300 mg  dose because of the recurrent bladder bleeding.  With this, we used a right  bypass catheter with side holes.  This engaged the vessel.  A Luge wire was  used and crossed the lesion. We initially could not get a stent across the  lesion for direct stenting and we had to predilate using a 2.5 x 20 Maverick  balloon.  We then chose a 2.75 x 28 Vision stent and this was taken up to 13  atmospheres.  Post dilatation was then done using a 3 mm Quantum Maverick  balloon.  The Quantum Maverick was taken up to about 8-10 atmospheres  throughout the course of  the stent making sure to stay within the edges.  Final angiographic result was excellent.  There were no complications.  The  femoral sheath was sewn into place and he was taken to the holding area in  satisfactory clinical condition.   HEMODYNAMIC DATA:  1.  Central aortic pressure 154/84.  2.  Left ventricular pressure 151/19.  3.  No gradient pullback across aortic valve.   ANGIOGRAPHIC DATA:  1.  Ventriculography was done in the RAO projection.  There is severe      inferobasal hypo to near akinesis with an ejection fraction of 41%.  2.  The left main is free of critical disease.  3.  The left anterior descending artery has a fairly typical diabetic      appearance and is small in caliber.  There is probably 40% narrowing      overlapping the origin of septal.  The septal itself has slow distal      flow.  There is a tiny proximal diagonal which is without critical      disease.   4.  The circumflex intermediate vessel that is a previously stented, the      stent is widely patent without evidence of significant recurrence.      There is about 30-40% narrowing just distal to the stent before the      bifurcation of the intermediate.  5.  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  It provides a pair of      circumflex marginal branches. In the proximal area, there are tandem 70%      lesions.  Following stenting, this is reduced to 0% with an excellent      angiographic appearance.  6.  The right coronary artery has been previously stented.  There is diffuse      luminal irregularity throughout the vessel.  Proximally, where the stent      is placed, there is less than 20% narrowing.  The mid vessel also has      less than 20%.  Near the acute margin, there is an area of segmental      plaque of about 40-50%.  There is also 40% narrowing across the origin      of the PDA.  There is tandem areas of 40%, 40%, and 50% narrowing also      throughout the proximal portion of the posterolateral system.   CONCLUSION:  1.  Moderate reduction in left ventricular function with an inferobasal wall      motion abnormality and ejection fraction of 41%.  2.  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  3.  No evidence of restenosis in the previously placed stents in the      intermediate vessel and in the proximal and mid right coronaries.  4.  No evidence of significant progression of disease in what appears to be      a diabetic appearing left anterior descending vessel.   DISPOSITION:  The patient will need at least a month of Plavix.  Following  this, it can be discontinued and he can be maintained on low dose aspirin  which is worked previously in the past.      Maisie Fus D. Riley Kill, M.D. [facility]  Electronically Signed     TDS/MEDQ  D:  03/13/2005  T:  03/13/2005  Job:  [id number]  cc:   Lucila Maine, M.D.

## 2010-05-30 NOTE — Assessment & Plan Note (Signed)
[facility]                            CARDIOLOGY OFFICE NOTE   NAME:Olenick, GIFFORD BALLON                    MRN:          [id number]  DATE:12/24/2005                            DOB:          11-Feb-1945    Mr. Staiger is in for a follow-up visit.  He really is doing quite well.  He denies any chest pain or significant shortness of breath.  He has  done well from a urinary standpoint as well.  He has not had syncope or  presyncope.   PHYSICAL EXAMINATION:  VITAL SIGNS:  Today the blood pressure is 122/70,  pulse 65.  LUNGS:  Lung fields clear.  HEART:  Rhythm is regular.  I do not appreciate a significant murmur.   His electrocardiogram demonstrates normal sinus rhythm.  There is an age-  indeterminate inferior wall infarction.   IMPRESSION:  1. Coronary artery disease, status post multiple percutaneous coronary      interventions.  2. Non-insulin dependent diabetes mellitus.  3. Hypercholesterolemia.  4. History of bladder cancer currently under treatment.  5. Moderate left ventricular dysfunction.   PLAN:  1. Return to clinic in 3-4 months.  2. Recheck echocardiogram at that time.  Last ejection fraction 41% in      the catheterization lab nearly 1 year ago.  3. Continue followup with Dr. Lorin Picket.     Arturo Morton. Riley Kill, MD, [facility]  Electronically Signed    TDS/MedQ  DD: 12/24/2005  DT: 12/24/2005  Job #: [id number]   cc:   Lucila Maine, M.D.

## 2010-05-30 NOTE — Cardiovascular Report (Signed)
NAMEADIN, LAKER                       ACCOUNT NO.:  [id number]   MEDICAL RECORD NO.:  [id number]                   PATIENT TYPE:  INP   LOCATION:  6526                                 FACILITY:  [facility]   PHYSICIAN:  Arturo Morton. Riley Kill, M.D.             DATE OF BIRTH:  Sep 11, 1945   DATE OF PROCEDURE:  01/19/2003  DATE OF DISCHARGE:                              CARDIAC CATHETERIZATION   INDICATIONS:  Mr. Decoste is well known to me.  He has had prior  percutaneous intervention of the right coronary artery in several locations.  He has done well from a clinical standpoint.  His clinical course has been  complicated by bladder cancer with subsequent resection and then a 14-unit  bleed.  He recently has noticed a tiny amount of bleeding and he is  scheduled to undergo repeat evaluation in another month or so.  The patient  has remained on both aspirin and Plavix successfully.  He has recently  developed some recurrent chest discomfort that is worrisome for angina.  The  patient has also had an episode of presyncope and has a history of exercise-  induced ventricular arrhythmia, but has been evaluated by Dr. Berton Mount in  the past and recommendations have been for beta blockade.  Current study was  done to assess coronary anatomy.   PROCEDURES:  1. Left heart catheterization.  2. Selective coronary arteriography.  3. Selective left ventriculography.  4. Percutaneous transluminal coronary angioplasty and stenting of the ramus     intermedius.   DESCRIPTION OF PROCEDURE:  The patient was brought to the catheterization  lab and prepped and draped in the usual fashion.  Through an anterior  puncture, the femoral artery was entered.  A 6 French sheath was placed.  Views of the right, circumflex and left coronary arteries were then obtained  in multiple angiographic projections.  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  Ventriculography was performed in the RAO projection.  He tolerated  this well.  The findings demonstrated continued patency of the previously  placed stents in the right coronary artery, but a new lesion of 90% in the  mid portion of the circumflex or ramus vessel.  Because of this,  percutaneous coronary intervention was recommended.  I discussed this in  detail with Mr. Warren and he and I agree that the best option at this  point would be to place a non drug-eluting stent largely related to the fact  that he is scheduled for a repeat cystoscopy for his bladder cancer and he  has had substantial bleeding in the past.  Moreover, he has not had  significant restenosis despite his underlying diabetes mellitus.  We were in  agreement that a non DES stent would be placed.  With this in mind, the  patient was given bivalirudin with the ACT prolonged in excess  of 300  seconds.  A JL-4 guiding catheter was utilized.  We placed a Hi-Torque  Floppy wire across the lesion.  Predilation was done with a 9 mm length 2.25  mm balloon.  The Maverick balloon was then removed and replaced by an 18-mm  length 2.5 Pixel stent.  This was taken to 14 atmospheres.  This was  subsequently post dilated with a 2.75 PowerSail balloon.  Final angiographic  views were excellent.  Multiple doses of intracoronary nitroglycerin were  administered.  All catheters were subsequently removed and the femoral  sheath sewn into place.  He was taken to the holding area in satisfactory  clinical condition.   HEMODYNAMIC DATA:  1. Central aortic pressure was 140/85 with a mean of 108.  2. Left ventricular pressure was 160/19.  3. There was no gradient on pullback across the aortic valve.   ANGIOGRAPHIC DATA:  1. Ventriculography was performed in the RAO projection.  There was     ventricular ectopy during the ventricular injection.  Therefore, ejection     fraction could not be calculated.  It did appear  to be in excess of 40%.     There was hypokinesis of the inferobasal segment consistent with his     known previous situation.  2. The left main coronary was free of critical disease.  3. The LAD coursed to the apex in the proximal vessel crossing the septal     perforator is a previously noted lesion of about 40-50%.  This does not     appear to be materially changed from the previous study.  Distal to the     take off of the diagonal vessel is about a 50% area of focal stenosis     which also is not dramatically changed.  Importantly, the vessel has a     typical diabetic appearance.  4. There are two basic ramus intermedius vessels.  The superior branch is     without critical narrowing although has mild luminal irregularity.  The     second branch has a new segmental 90% stenosis prior to its distal     bifurcation.  Following stenting, this was reduced to 0% with an     excellent angiographic appearance.  There is very mild plaquing proximal     to the stent site.  5. The circumflex that supplies the AV circumflex and also a distal marginal     arises from the right coronary cusp.  There is some mild irregularity     proximally of about 20% and again the distal vessel has a somewhat     diabetic appearance being smaller in caliber.  6. The right coronary artery is a large dominant vessel.  In the proximal     vessel is a previously stent with no more than 20% renarrowing.  In the     mid vessel at the bend point, this also has a stent with less than 30-40%     narrowing with just very mild scar reaction.  Distally, there is a 30-40%     eccentric area of plaquing in the distal bend followed by about 30%     narrowing prior to a distally placed stent proximal the PDA.  The PDA     itself is uninvolved.  There is marked tortuosity in the continuation     branch of the right coronary with about 60 and 50% narrowing in the    branch leading into the moderately large  posterior lateral  branch.   CONCLUSION:  1. Preserved overall left ventricular function with an inferobasal wall     motion abnormality as on previous studies.  2. New progressive high grade lesion of the circumflex ramus branch with     successful percutaneous stenting with a non-drug-eluting stent as     described above in the above text.  3. 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  4. No evidence of high grade restenosis in the previously placed right     coronary artery stents with multiple areas of plaquing as noted in the     above text.   DISPOSITION:  Continued medical therapy will be warranted.  Because of the  non-DES stent, his risk of restenosis is higher which he, his wife, and his  daughter all understand.  However, he is safe from the standpoint the Plavix  could be discontinued in approximately 4 weeks if necessary for further  evaluation of treatment of his bladder cancer or if recurrent bleeding  occurred.                                               Arturo Morton. Riley Kill, M.D.    TDS/MEDQ  D:  01/19/2003  T:  01/19/2003  Job:  [id number]   cc:   Lucila Maine in [place] L[place], M.D.

## 2010-05-30 NOTE — Discharge Summary (Signed)
Carl Stephenson, Carl Stephenson                       ACCOUNT NO.:  [id number]   MEDICAL RECORD NO.:  [id number]                   PATIENT TYPE:  INP   LOCATION:  6532                                 FACILITY:  [facility]   PHYSICIAN:  Carole Binning, M.D. [facility]         DATE OF BIRTH:  08/23/1945   DATE OF ADMISSION:  01/18/2003  DATE OF DISCHARGE:  01/20/2003                                 DISCHARGE SUMMARY   PROCEDURE:  1. Cardiac catheterization.  2. Coronary arteriogram.  3. Left ventriculogram.  4. PTCA and stent to one vessel.   HOSPITAL COURSE:  Carl Stephenson is a 65 year old male with known coronary  artery disease.  He had chest pain described as tightness and pressure that  was not associated with exertion as he also had some resting symptoms.  He  has had five to six episodes over the last week and it was associated with  shortness of breath but no nausea, vomiting, and diaphoresis.  Each episode  lasted less than five minutes.  There were no clear aggravating or  alleviating factors.  He is not sure if this is his angina.  He also reports  an episode of presyncope that started when his vision became blurred and he  felt like he was going to pass out but did not feel like he completely loss  consciousness.  He had no palpitations such as he has had in the past when  he has been on the treadmill and had exercise induced ventricular  tachycardia.   Carl Stephenson was admitted for further evaluation and treatment.   His enzymes were negative for myocardial infarction but it was felt with his  history of coronary artery disease that catheterization was indicated.   The cardiac catheterization showed a 90% stenosis in the ramus intermedius  that was treated with a pixel stent reducing the stenosis to 0 with TIMI III  flow.  There was no other critical disease, although he had multiple 20-60%  lesions in his LAD, circumflex, and RCA.  His left ventriculogram showed  inferior hypokinesis  and he had ectopy so an absolute number could not be  generate but it seemed greater than 40%.  He tolerated the procedure well.   Because of his near syncope, Carl Stephenson is not to drive until he is  reevaluated by the MD as an outpatient.  On January 20, 2003, his laboratory  values were within normal limits except for a glucose of 220.  His Amaryl  and Glucophage have been held by the staff.  He is to restart the Amaryl  today and restart the Glucophage on January 22, 2003.  His catheterization  site was without ecchymosis, hematoma, or bruit.  He had sinus rhythm with  rare PVCs on the monitor and occasional bradycardia while he was asleep but  no medication changes were made.   Carl Stephenson was considered stable for discharge on January 20, 2003 and is  to  follow up as an outpatient next week in the office.   CONDITION ON DISCHARGE:  Improved.   DISCHARGE DIAGNOSES:  1. Unstable angina pain status post pixel stent to the ramus intermedius     this admission.  2. Episode of near syncope that started at rest.  No driving until cleared     by doctor.  3. Noninsulin-dependent diabetes mellitus.  4. Hyperlipidemia.  5. Exercised induced ventricular tachycardia.  6. History of bladder cancer with resection x 2.  7. Hiatal hernia.  8. Status post myocardial infarction in 1994 with streptokinase and then     percutaneous transluminal coronary angioplasty to his right coronary     artery.  9. Status post catheterization in 2002 with percutaneous transluminal     coronary angioplasty and stent x 2 to the right coronary artery.  10.      Repeat catheterization in November 2002 with percutaneous     transluminal coronary angioplasty to the right coronary artery.  11.      Allergy to Macrobid.   DISCHARGE INSTRUCTIONS:  1. His activity level is to include no driving until cleared by doctor.  2. He can return to work on Monday as long as he is not driving.  3. He is to stick to a low fat,  diabetic diet.  4. He is to call the office with problems with the catheterization site.  5. He is to follow up with Dr. Arturo Morton. Stuckey next week and the office     will call.   DISCHARGE MEDICATIONS:  1. Aspirin 325 mg q.d.  2. Zantac 150 mg b.i.d.  3. Plavix 75 mg q.d.  4. Tri-Chlor 160 mg q.d.  5. Amaryl 4 mg 1/2 tablet q.d.  6. Lopressor 50 mg b.i.d.  7. Glucophage 500 mg 2 tablets t.i.d., restart Monday January 22, 2003 and     it is okay to hold Imdur.  8. Nitroglycerin p.r.n.      Theodore Demark, P.A. [facility]                  Carole Binning, M.D. [facility]    RB/MEDQ  D:  01/20/2003  T:  01/20/2003  Job:  [id number]   cc:   Arturo Morton. Riley Kill, M.D.   Lucila Maine, M.D.

## 2010-05-30 NOTE — H&P (Signed)
[place]  Patient:    Carl Stephenson, Carl Stephenson Visit Number: [id number] MRN: [id number]          Service Type: CAT Location: [facility] 2899 10 Attending Physician:  Ronaldo Miyamoto Dictated by:   Lavella Hammock, P.A. Admit Date:  12/07/2000                           History and Physical  DATE OF BIRTH:  [birth year]  CHIEF COMPLAINT:  Left chest tightness and abnormal Cardiolite.  HISTORY OF PRESENT ILLNESS:  Mr. Bledsoe is a 65 year old male patient of Dr. Louretta Shorten who has a cardiac history that includes an MI in 1994 with streptokinase and angioplasty of the RCA.  In February 2002 he had a cardiac catheterization and received PTCA and stents x 2 to the proximal and distal RCA.  His most recent cardiac catheterization was on October 31 and it showed a 60% LAD with a 70% septal perforator and 50% and 60% RCA lesions.  Medical therapy was felt the best option at that time, but he had a Cardiolite on November 22, 2000 that showed an inferolateral scar with peri-infarct ischemia which looked to be increased from the January 2002 Cardiolite study.  He is therefore here today for cardiac catheterization with possible percutaneous intervention.  The patient occasionally gets left-sided chest pain with exertion.  This is not as severe and does not have the associated symptoms of his MI pain, but it is his usual anginal pain.  It has not been severe enough in the recent past for him to take any nitroglycerin.  He has otherwise been doing well.  PAST MEDICAL HISTORY: 1. Diaphragmatic MI in November 1994, treated with angioplasty to the RCA and    streptokinase. 2. PTCA and stents to the proximal and distal RCA in February 2002. 3. Cardiac catheterization November 11, 2000 showing 60% LAD, 70% septal    perforator, 50% and 60% RCA lesions.  EF was 50% with inferobasal    hypokinesis, as seen on prior studies. 4. Non-insulin-dependent diabetes  mellitus. 5. Hyperlipidemia. 6. History of tobacco use (quit 1994). 7. History of hiatal hernia. 8. Bladder cancer, status post resection x 2. 9. 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  SURGICAL HISTORY:  Cardiac catheterization x 3, bladder cancer resection x 2.  FAMILY HISTORY:  Positive for coronary artery disease.  SOCIAL HISTORY:  He is married.  He has two grown children.  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  He smoked heavily but not since 1994.  ALLERGIES:  MACROBID.  MEDICATIONS: 1. Lopid 600 mg p.o. b.i.d. 2. Lopressor 50 mg p.o. b.i.d. 3. Imdur 60 mg one-half tablet q.d. 4. Coated aspirin 325 mg q.d. 5. Zantac 150 mg b.i.d. 6. Mycelex 20 mg p.o. q.d. 7. Celebrex 200 mg b.i.d. 8. Plavix 75 mg q.d. 9. Glucophage 500 mg p.o. t.i.d.  REVIEW OF SYSTEMS:  The patient denies any recent fever or chills.  He denies hematemesis or hemoptysis.  He has no history of thyroid disease.  He denies melena or hematuria.  He has occasional reflux symptoms, on medication.  PHYSICAL EXAMINATION:  VITAL SIGNS:  Temperature 97.0, blood pressure 112/61, pulse 67 and regular, respirations 18 and not labored.  GENERAL:  Patient is well-developed, slightly obese white male in no acute distress.  His skin looks flushed in the face.  HEENT:  His head  is normocephalic and atraumatic.  His extraocular movements are intact.  His pupils are equal, round and reactive to light.  NECK:  There is no JVD.  No carotid bruits are appreciated.  There is no thyromegaly.  CHEST:  Clear to auscultation bilaterally.  CARDIOVASCULAR:  Heart is regular in rate and rhythm.  There is a possible S4 with a 1/6 systolic ejection murmur in the left upper sternal border.  ABDOMEN:  Soft and nontender.  He has active bowel sounds.  There is no hepatosplenomegaly appreciated.  EXTREMITIES:  There is no lower extremity edema.  Distal pulses are 2-3+  all four extremities.  There are no femoral bruits appreciated.  NEUROLOGIC:  The patient is alert and oriented with no focal deficits noted.  LABORATORY DATA:  ECG:  Done November 17, 2000 showing sinus rhythm with Q waves in II, III, and aVF, and minor T wave changes in V5 and V6.  Laboratory values:  Pending.  ASSESSMENT AND PLAN: 1. Coronary artery disease, status post percutaneous transluminal coronary    angioplasty and stent to the right coronary artery in January 2002 after    percutaneous transluminal coronary angioplasty for inferior infarction    in 1994.  With the current abnormal Cardiolite, the patient will be    admitted for cardiac catheterization and possible percutaneous    intervention.  He will be continued on his current medications. 2. Non-insulin-dependent diabetes mellitus.  The patients Glucophage will be    held.  We will order a nutrition consult for better compliance with the    diabetic diet (the patient admits he is not very compliant). 3. The patient is otherwise stable; see past medical history. Dictated by:   Lavella Hammock, P.A. Attending Physician:  Ronaldo Miyamoto DD:  12/07/00 TD:  12/07/00 Job: [id number]

## 2010-05-30 NOTE — Assessment & Plan Note (Signed)
[facility]                            CARDIOLOGY OFFICE NOTE   Cyree, Chuong SHLOME BALDREE                    MRN:          [id number]  DATE:04/26/2006                            DOB:          1945-11-28    Mr. Landini is in for a followup visit.  In general, he is doing quite  well.  He has had several followups with Dr. __________ now on his  bladder tumor, and it has been apparently stable.  He has had some neck  discomfort.  The neck discomfort has been perhaps slightly worse with  movement.  He had an echocardiogram done here in early April which  demonstrated normal left ventricular size and function.  The aortic root  was normal in size.  There was no obvious evidence of dissection. The  ejection fraction was 55% which is better than it has been by contrast.  He has not been having exertional chest pain. His blood pressures have  been elevated but they are not sure whether his machine is working  appropriately.  He has not had significant hypertension in the past.  He  also feels perhaps a little bit more fatigued on Coreg.   His medications include:  1. Coreg 12.5 mg p.o. b.i.d.  2. Isosorbide.  3. Mononitrate 30 mg daily.  4. Vytorin 10/40 daily.  5. Aspirin 325 mg daily.  6. Metformin 500 mg two tablets t.i.d.  7. Protonix 40 mg daily.  8. Citalopram 10 mg daily.  9. Ketoconazole 2%.  10.Fexofenadine 180 mg daily.  11.Glimepiride 4 mg daily.  12.Detrol LA 4 mg daily.   PHYSICAL EXAMINATION:  The blood pressure is 140/78, the pulse is 61.  The lung fields are clear.  The cardiac rhythm is regular.  There is not a significant murmur noted.  There is no extremity edema.   The electrocardiogram demonstrates upper limits of normal size with  ejection fraction of 55%.   The EKG demonstrates evidence of an age indeterminate inferior  infarction.  Of note the echocardiogram does demonstrate inferior basil  hypokinesis.   Most recent  chest x-ray done in early March is a portable film, revealed  cardiomegaly with mild peribronchial thickening in a few scattered  nodular passages that were normal.   The patient is stable at the present time.  He may be a bit fatigued on  Coreg and I have asked them to speak with Dr. Lorin Picket, his primary care  physician, going forward about options if he thinks the Coreg is causing  specific problems.  We also discussed recent data regarding differences  between the __________  and glimepiride with regard to coronary  progression.  At some point, it may be reasonable to consider switching  him to pioglitazone as opposed to glimepiride.  Although I told the  patient that they would need to be watched with regard volume issues  with this particular therapy.   The patient will see me back in followup in about 6 months.     Arturo Morton. Riley Kill, MD, [facility]  Electronically Signed    TDS/MedQ  DD: 04/26/2006  DT: 04/26/2006  Job #: [id number]

## 2010-05-30 NOTE — Cardiovascular Report (Signed)
[place]  Patient:    Carl Stephenson, Carl Stephenson Visit Number: [id number] MRN: [id number]          Service Type: CAT Location: [facility] 2859 [id number] Attending Physician:  Ronaldo Miyamoto Dictated by:   Arturo Morton Riley Kill, M.D. [facility] Proc. Date: 11/11/00 Admit Date:  11/11/2000 Discharge Date: 11/11/2000   CC:         Cardiac Catheterization Lab  Lucila Maine, M.D., [place], [place]   Cardiac Catheterization  INDICATIONS:  [name] is a 65 year old well known to me.  In 1994, he had an inferior infarction treated with primary percutaneous intervention.  He then developed some recurrent angina and had high-grade lesions in the proximal and distal right coronary which were successfully stented in January of this year.  He has developed a couple of additional episodes of chest pain. At that time, he was known to have about a 50% mid lesion which we elected not to dilate and a 50-60% mid distal LAD lesion which we also elected not to dilate.  Because he is diabetic and at high risk for restenosis, we elected to recommend repeat catheterization after two or three episodes with some intermediate-type chest discomfort.  The current study was done to assess coronary anatomy.  PROCEDURES: 1. Left heart catheterization. 2. Selective coronary arteriography. 3. Selective left ventriculography.  DESCRIPTION OF PROCEDURE:  The procedure was performed from the right femoral artery using 6-French catheters.  He tolerated the procedure without complication.  HEMODYNAMIC DATA: 1. Central aorta 129/68, mean 96. 2. Left ventricle 129/8, EDP 13.  ANGIOGRAPHIC DATA: 1. The left main coronary artery is free of significant disease.  There are    two intermedius branches which occupy the distribution of the circumflex.    In addition, there is an anomalous circumflex takeoff from the right    coronary ostium.  These three vessels all appear to be free of critical  disease supplying the circumflex distribution. 2. The left anterior descending artery courses to the apex.  After a septal    and diagonal branch, there is about 60% narrowing.  We gave intracoronary    nitroglycerin to better highlight this area.  It is not substantially    changed from the previous study.  There is a septal perforating vessel    that has about a 70% mid lesion and none of this appears to be    dramatically changed from the previous study. 3. The right coronary artery demonstrates a stent in the proximal vessel    without any evidence of restenosis.  In the mid vessel, there is 50% and    60% eccentric narrowing.  The eccentric narrowing demonstrates a slight    change from the previous study, but nonetheless, the lumen still appears    to be in excess of 2 mm.  Distally, there is also no restenosis prior to    the PDA.  In the large posterolateral branch, there is about 60% narrowing    in the bend but this does not appear to be changed from the previous study.  Ventriculography in the RAO projection reveals hypokinesis of the inferobasal segment.  Overall ejection fraction is preserved at 50% or so.  The inferobasal segment is severely hypokinetic as on previous studies.  CONCLUSIONS: 1. Mild reduction in left ventricular function with an inferior wall motion    abnormality. 2. Continued patency at the stent sites from January of 2002 with mild    progression of the mid  lesion from the previous study. 3. 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 4. Moderate stenosis of uncertain hemodynamic significance in the mid left    anterior descending artery.  DISPOSITION:  My inclination at the present time is to treat him medically. The mid right coronary stenosis is on a steep bend.  I plan to add Plavix back to his regimen.  We will continue to treat him with this.  We plan to do an outpatient Cardiolite to try to distinguish between the RCA and  the LAD although the RCA would be difficult because of the presence of a prior inferior scar.  Importantly, if we were to treat this, it would be optimal given his diabetes and the long bend in the mid vessel to treat with a drug-coated stent which hopefully will be available in the next six months.  I will explain all of this to the patient.  I have reviewed the films with the patients wife and daughter in the catheterization suite.     diagonal branch. Dictated by:   Arturo Morton Riley Kill, M.D. [facility] Attending Physician:  Ronaldo Miyamoto DD:  11/11/00 TD:  11/12/00 Job: [id number]

## 2010-08-08 ENCOUNTER — Encounter: Payer: Self-pay | Admitting: Cardiology

## 2010-08-14 ENCOUNTER — Encounter: Payer: Self-pay | Admitting: Cardiology

## 2010-08-14 ENCOUNTER — Telehealth: Payer: Self-pay | Admitting: Cardiology

## 2010-08-14 ENCOUNTER — Ambulatory Visit (INDEPENDENT_AMBULATORY_CARE_PROVIDER_SITE_OTHER): Payer: Federal, State, Local not specified - PPO | Admitting: Cardiology

## 2010-08-14 VITALS — BP 114/70 | HR 54 | Resp 18 | Ht 66.0 in | Wt 192.8 lb

## 2010-08-14 DIAGNOSIS — E785 Hyperlipidemia, unspecified: Secondary | ICD-10-CM

## 2010-08-14 DIAGNOSIS — I639 Cerebral infarction, unspecified: Secondary | ICD-10-CM

## 2010-08-14 DIAGNOSIS — I6350 Cerebral infarction due to unspecified occlusion or stenosis of unspecified cerebral artery: Secondary | ICD-10-CM

## 2010-08-14 DIAGNOSIS — I2510 Atherosclerotic heart disease of native coronary artery without angina pectoris: Secondary | ICD-10-CM

## 2010-08-14 DIAGNOSIS — I6330 Cerebral infarction due to thrombosis of unspecified cerebral artery: Secondary | ICD-10-CM | POA: Insufficient documentation

## 2010-08-14 NOTE — Assessment & Plan Note (Signed)
Followed by Dr. Lorin Picket.  Will defer to him.

## 2010-08-14 NOTE — Progress Notes (Signed)
HPI:  He is in for follow up.  He was admitted to [facility] for left sided weakness.  Dopplers, echo, complete work up apparently done, and he was discharged on ASA and Plavix.  He was scheduled to see the Urologist, and have follow up, with one antitplatelet agent.  No exertional symptoms.  Weakness has improved, but the left side is still somewhat weak.  He is getting home therapy.  His main weakness has been left sided, leg greater than arm.  He has gotten in home rehab.   He is some better.    Current Outpatient Prescriptions  Medication Sig Dispense Refill  . aspirin 325 MG tablet Take 325 mg by mouth daily.        [name] atorvastatin (LIPITOR) 20 MG tablet Take 20 mg by mouth daily.        . carvedilol (COREG) 25 MG tablet Take 12.5 mg by mouth 2 (two) times daily with a meal.        . clonazePAM (KLONOPIN) 1 MG tablet Take 1 mg by mouth 2 (two) times daily as needed.        . clopidogrel (PLAVIX) 75 MG tablet Take 75 mg by mouth daily.        [name] ezetimibe (ZETIA) 10 MG tablet Take 10 mg by mouth daily.        [name] gemfibrozil (LOPID) 600 MG tablet Take 600 mg by mouth.        [name] glimepiride (AMARYL) 4 MG tablet Take 1 1/2 tablets daily      . insulin detemir (LEVEMIR FLEXPEN) 100 UNIT/ML injection Inject 26 Units into the skin at bedtime.        . isosorbide mononitrate (IMDUR) 30 MG 24 hr tablet Take 30 mg by mouth daily.        . metFORMIN (GLUCOPHAGE) 850 MG tablet Take 850 mg by mouth 3 (three) times daily.        . nitroGLYCERIN (NITROSTAT) 0.4 MG SL tablet Place 0.4 mg under the tongue every 5 (five) minutes as needed. May repeat for up to 3 doses.       . pantoprazole (PROTONIX) 40 MG tablet Take 40 mg by mouth daily.        . paroxetine mesylate (PEXEVA) 40 MG tablet Take 20 mg by mouth every morning.        . triamcinolone (KENALOG) 0.1 % cream Apply topically 2 (two) times daily as needed.          Allergies  Allergen Reactions  . Macrobid     Past Medical History  Diagnosis  Date  . Hypertension   . Hyperlipidemia   . Diabetes mellitus type II   . GERD (gastroesophageal reflux disease)   . Obesity   . Bladder tumor     Recurrent  . MI (myocardial infarction) 1994    Inferior, PCI and Streptokinase  . Tobacco user     Quit 1994  . Wide-complex tachycardia     Past Surgical History  Procedure Date  . Cystectomy     For bladder cancer x2    Family History  Problem Relation Age of Onset  . Coronary artery disease Other     History   Social History  . Marital Status: Married    Spouse Name: N/A    Number of Children: N/A  . Years of Education: N/A   Occupational History  . [employer] in [place] [employer]   Social History Main  Topics  . Smoking status: Former Smoker    Quit date: 01/13/1992  . Smokeless tobacco: Not on file  . Alcohol Use: Not on file  . Drug Use: Not on file  . Sexually Active: Not on file   Other Topics Concern  . Not on file   Social History Narrative   Married 2 grown children    ROS: Please see the HPI.  All other systems reviewed and negative.  PHYSICAL EXAM:  BP 114/70  Pulse 54  Resp 18  Ht 5\' 6"  (1.676 m)  Wt 192 lb 12.8 oz (87.454 kg)  BMI 31.12 kg/m2  General: Well developed, well nourished, in no acute distress. Head:  Normocephalic and atraumatic. Neck: no JVD Lungs: Clear to auscultation and percussion. Heart: Normal S1 and S2.  No murmur, rubs or gallops.  Abdomen:  Normal bowel sounds; soft; non tender; no organomegaly Pulses: Pulses normal in all 4 extremities. Extremities: No clubbing or cyanosis. No edema. Neurologic: Alert and oriented x 3.  Weakness LUE slight, more prominent in LLE.    EKG:  SB.   Inferior MI, old.   ASMI MI, old.  Lateral T inversion.  No change from 201.  ECHO:  April 13, 2006   SUMMARY   -  Left ventricular size was at the upper limits of normal. Overall         left ventricular systolic function was normal. Left         ventricular  ejection fraction was estimated to be 55 %. There         was hypokinesis and thinning of the basal-mid inferior wall.   -  There was mild mitral annular calcification.    ---------------------------------------------------------------   Prepared and Electronically Authenticated by   Nicholes Mango MD   Confirmed 13-Apr-2006 17:15:39  ASSESSMENT AND PLAN:

## 2010-08-14 NOTE — Patient Instructions (Signed)
Your physician recommends that you schedule a follow-up appointment in: 4 months with Dr. Riley Kill

## 2010-08-14 NOTE — Telephone Encounter (Addendum)
Release faxed to [facility]  @ [phone]  08/14/10/KM  Records received from [facility] gave to [facility] 08/18/10/km

## 2010-08-14 NOTE — Assessment & Plan Note (Signed)
Will try to get records.  Sounds like he had the full work up, and nothing was really changed.  Will get details from [place].

## 2010-08-14 NOTE — Assessment & Plan Note (Signed)
No specific cardiac complaints.  Continue medical therapy.

## 2010-09-10 ENCOUNTER — Encounter: Payer: Federal, State, Local not specified - PPO | Admitting: Cardiology

## 2010-10-02 ENCOUNTER — Encounter: Payer: Federal, State, Local not specified - PPO | Admitting: Cardiology

## 2010-12-16 ENCOUNTER — Encounter: Payer: Self-pay | Admitting: Cardiology

## 2010-12-16 ENCOUNTER — Ambulatory Visit (INDEPENDENT_AMBULATORY_CARE_PROVIDER_SITE_OTHER): Payer: Federal, State, Local not specified - PPO | Admitting: Cardiology

## 2010-12-16 VITALS — BP 118/68 | HR 51 | Ht 67.0 in | Wt 194.4 lb

## 2010-12-16 DIAGNOSIS — I6350 Cerebral infarction due to unspecified occlusion or stenosis of unspecified cerebral artery: Secondary | ICD-10-CM

## 2010-12-16 DIAGNOSIS — I2510 Atherosclerotic heart disease of native coronary artery without angina pectoris: Secondary | ICD-10-CM

## 2010-12-16 DIAGNOSIS — I639 Cerebral infarction, unspecified: Secondary | ICD-10-CM

## 2010-12-16 DIAGNOSIS — E785 Hyperlipidemia, unspecified: Secondary | ICD-10-CM

## 2010-12-16 NOTE — Progress Notes (Signed)
HPI:  [name] is back in follow up.  He is holding his own.  He denies any chest pain. There have been no new stroke symptoms, nor change in status whatsoever.    Current Outpatient Prescriptions  Medication Sig Dispense Refill  . aspirin 325 MG tablet Take 325 mg by mouth daily.       [name] atorvastatin (LIPITOR) 20 MG tablet Take 20 mg by mouth daily.        . carvedilol (COREG) 25 MG tablet Take 12.5 mg by mouth 2 (two) times daily with a meal.        . clonazePAM (KLONOPIN) 1 MG tablet Take 1 mg by mouth 2 (two) times daily as needed.        . clopidogrel (PLAVIX) 75 MG tablet Take 75 mg by mouth daily.        [name] ezetimibe (ZETIA) 10 MG tablet Take 10 mg by mouth daily.        [name] gemfibrozil (LOPID) 600 MG tablet Take 600 mg by mouth.        [name] glimepiride (AMARYL) 4 MG tablet Take 1 1/2 tablets daily      . insulin lispro (HUMALOG) 100 UNIT/ML injection Inject into the skin 3 (three) times daily before meals.        . isosorbide mononitrate (IMDUR) 30 MG 24 hr tablet Take 30 mg by mouth daily.        . metFORMIN (GLUCOPHAGE) 850 MG tablet Take 850 mg by mouth 3 (three) times daily.        . nitroGLYCERIN (NITROSTAT) 0.4 MG SL tablet Place 0.4 mg under the tongue every 5 (five) minutes as needed. May repeat for up to 3 doses.       . pantoprazole (PROTONIX) 40 MG tablet Take 40 mg by mouth daily.        . paroxetine mesylate (PEXEVA) 40 MG tablet Take 20 mg by mouth every morning.          Allergies  Allergen Reactions  . Macrobid     Past Medical History  Diagnosis Date  . Hypertension   . Hyperlipidemia   . Diabetes mellitus type II   . GERD (gastroesophageal reflux disease)   . Obesity   . Bladder tumor     Recurrent  . MI (myocardial infarction) 1994    Inferior, PCI and Streptokinase  . Tobacco user     Quit 1994  . Wide-complex tachycardia     Past Surgical History  Procedure Date  . Cystectomy     For bladder cancer x2    Family History  Problem Relation  Age of Onset  . Coronary artery disease Other     History   Social History  . Marital Status: Married    Spouse Name: N/A    Number of Children: N/A  . Years of Education: N/A   Occupational History  . [employer] Par[employer]ic in [place] [employer]   Social History Main Topics  . Smoking status: Former Smoker    Quit date: 01/13/1992  . Smokeless tobacco: Not on file  . Alcohol Use: Not on file  . Drug Use: Not on file  . Sexually Active: Not on file   Other Topics Concern  . Not on file   Social History Narrative   Married 2 grown children    ROS: Please see the HPI.  All other systems reviewed and negative.  PHYSICAL EXAM:  BP 118/68  Pulse  51  Ht 5\' 7"  (1.702 m)  Wt 88.179 kg (194 lb 6.4 oz)  BMI 30.45 kg/m2  General: Well developed, well nourished, in no acute distress. Head:  Normocephalic and atraumatic. Neck: no JVD Lungs: Clear to auscultation and percussion. Heart: Normal S1 and S2.  No murmur, rubs or gallops.  Abdomen:  Normal bowel sounds; soft; non tender; no organomegaly Pulses: Pulses normal in all 4 extremities. Extremities: No clubbing or cyanosis. No edema. Neurologic: Alert and oriented x 3.  EKG:  SB.  PVCs.  Inferior MI, age indeterminate.  T wave inversion, lateral leads.  ASSESSMENT AND PLAN:

## 2010-12-16 NOTE — Assessment & Plan Note (Signed)
No change in status.  Records reviewed.  TS

## 2010-12-16 NOTE — Patient Instructions (Signed)
Your physician wants you to follow-up in: 4 MONTHS.  You will receive a reminder letter in the mail two months in advance. If you don't receive a letter, please call our office to schedule the follow-up appointment.  Your physician recommends that you continue on your current medications as directed. Please refer to the Current Medication list given to you today.  

## 2010-12-16 NOTE — Assessment & Plan Note (Signed)
Managed by Dr. Lorin Picket.

## 2010-12-16 NOTE — Progress Notes (Signed)
Patient ID: Carl Stephenson, male   DOB: [birth year], 65 y.o.   MRN: [id number]

## 2010-12-16 NOTE — Assessment & Plan Note (Signed)
No new cardiac symptoms.

## 2011-01-20 DIAGNOSIS — J069 Acute upper respiratory infection, unspecified: Secondary | ICD-10-CM | POA: Diagnosis not present

## 2011-01-20 DIAGNOSIS — J209 Acute bronchitis, unspecified: Secondary | ICD-10-CM | POA: Diagnosis not present

## 2011-01-20 DIAGNOSIS — R0609 Other forms of dyspnea: Secondary | ICD-10-CM | POA: Diagnosis not present

## 2011-01-20 DIAGNOSIS — R0989 Other specified symptoms and signs involving the circulatory and respiratory systems: Secondary | ICD-10-CM | POA: Diagnosis not present

## 2011-01-23 DIAGNOSIS — J180 Bronchopneumonia, unspecified organism: Secondary | ICD-10-CM | POA: Diagnosis not present

## 2011-01-28 DIAGNOSIS — I739 Peripheral vascular disease, unspecified: Secondary | ICD-10-CM | POA: Diagnosis not present

## 2011-06-26 DIAGNOSIS — R4701 Aphasia: Secondary | ICD-10-CM | POA: Insufficient documentation

## 2011-06-26 DIAGNOSIS — G603 Idiopathic progressive neuropathy: Secondary | ICD-10-CM | POA: Insufficient documentation

## 2011-06-29 DIAGNOSIS — N390 Urinary tract infection, site not specified: Secondary | ICD-10-CM | POA: Diagnosis not present

## 2011-06-29 DIAGNOSIS — R351 Nocturia: Secondary | ICD-10-CM | POA: Diagnosis not present

## 2011-06-29 DIAGNOSIS — N400 Enlarged prostate without lower urinary tract symptoms: Secondary | ICD-10-CM | POA: Diagnosis not present

## 2011-06-29 DIAGNOSIS — C679 Malignant neoplasm of bladder, unspecified: Secondary | ICD-10-CM | POA: Diagnosis not present

## 2011-07-10 DIAGNOSIS — D303 Benign neoplasm of bladder: Secondary | ICD-10-CM | POA: Diagnosis not present

## 2011-07-10 DIAGNOSIS — C679 Malignant neoplasm of bladder, unspecified: Secondary | ICD-10-CM | POA: Diagnosis not present

## 2011-07-10 DIAGNOSIS — Z87898 Personal history of other specified conditions: Secondary | ICD-10-CM | POA: Diagnosis not present

## 2011-08-07 DIAGNOSIS — M79609 Pain in unspecified limb: Secondary | ICD-10-CM | POA: Diagnosis not present

## 2011-09-25 ENCOUNTER — Encounter: Payer: Self-pay | Admitting: Cardiology

## 2011-09-25 ENCOUNTER — Ambulatory Visit (INDEPENDENT_AMBULATORY_CARE_PROVIDER_SITE_OTHER): Payer: Medicare Other | Admitting: Cardiology

## 2011-09-25 VITALS — BP 143/73 | HR 57 | Ht 67.0 in | Wt 190.0 lb

## 2011-09-25 DIAGNOSIS — Z8639 Personal history of other endocrine, nutritional and metabolic disease: Secondary | ICD-10-CM

## 2011-09-25 DIAGNOSIS — I2510 Atherosclerotic heart disease of native coronary artery without angina pectoris: Secondary | ICD-10-CM | POA: Diagnosis not present

## 2011-09-25 DIAGNOSIS — E785 Hyperlipidemia, unspecified: Secondary | ICD-10-CM | POA: Diagnosis not present

## 2011-09-25 DIAGNOSIS — Z862 Personal history of diseases of the blood and blood-forming organs and certain disorders involving the immune mechanism: Secondary | ICD-10-CM | POA: Diagnosis not present

## 2011-09-25 LAB — PROTIME-INR: Prothrombin Time: 13.3 seconds (ref 11.6–15.2)

## 2011-09-25 LAB — CBC WITH DIFFERENTIAL/PLATELET
Basophils Relative: 1 % (ref 0–1)
Eosinophils Absolute: 0.3 10*3/uL (ref 0.0–0.7)
MCH: 27.2 pg (ref 26.0–34.0)
MCHC: 35.9 g/dL (ref 30.0–36.0)
Monocytes Relative: 8 % (ref 3–12)
Neutrophils Relative %: 63 % (ref 43–77)
Platelets: 180 10*3/uL (ref 150–400)

## 2011-09-25 NOTE — Patient Instructions (Addendum)

## 2011-09-26 LAB — BASIC METABOLIC PANEL
BUN: 11 mg/dL (ref 6–23)
CO2: 19 mEq/L (ref 19–32)
Chloride: 101 mEq/L (ref 96–112)
Creat: 1 mg/dL (ref 0.50–1.35)

## 2011-09-27 NOTE — Assessment & Plan Note (Signed)
Patient is in for follow up.  His symptoms sound similar to what he has had in the past, and he has multiple risk factors for progression of disease.  I have recommended repeat cardiac catheterization for further evaluation. The patient has had this, as well as multiple prior interventions, and is agreeable to proceed. He understands the risks, benefits, and alternatives all too well. We will obtain laboratory studies make arrangements for next week.

## 2011-09-27 NOTE — Assessment & Plan Note (Signed)
Has remained on a multidrug strategy for a long time.

## 2011-09-27 NOTE — Assessment & Plan Note (Signed)
Managed by his primary provider.

## 2011-09-27 NOTE — Progress Notes (Signed)
HPI:   Discharge is in for followup. In general, he is tired more quickly. He has noticed some pain in the upper chest, and this is not dissimilar from what he has had in the past. It is similar in quality, and he is noticed it more over the past month or two.  Both he and his wife expressed some concern based on his prior history.  Current Outpatient Prescriptions  Medication Sig Dispense Refill  . aspirin 81 MG tablet Take 81 mg by mouth daily.      [name] atorvastatin (LIPITOR) 20 MG tablet Take 20 mg by mouth daily.        . carvedilol (COREG) 25 MG tablet Take 12.5 mg by mouth 2 (two) times daily with a meal.        . clonazePAM (KLONOPIN) 1 MG tablet Take 1 mg by mouth 2 (two) times daily as needed.        . clopidogrel (PLAVIX) 75 MG tablet Take 75 mg by mouth daily.        [name] ezetimibe (ZETIA) 10 MG tablet Take 10 mg by mouth daily.        . fexofenadine (ALLEGRA) 180 MG tablet Take 180 mg by mouth daily.      [name] gemfibrozil (LOPID) 600 MG tablet Take 600 mg by mouth 2 (two) times daily.       [name] glimepiride (AMARYL) 4 MG tablet Take 1 1/2 tablets daily      . insulin lispro (HUMALOG) 100 UNIT/ML injection Inject into the skin 3 (three) times daily before meals.        . isosorbide mononitrate (IMDUR) 30 MG 24 hr tablet Take 30 mg by mouth daily.        . metFORMIN (GLUCOPHAGE) 850 MG tablet Take 850 mg by mouth 3 (three) times daily.        . nitroGLYCERIN (NITROSTAT) 0.4 MG SL tablet Place 0.4 mg under the tongue every 5 (five) minutes as needed. May repeat for up to 3 doses.       [name] oxazepam (SERAX) 10 MG capsule Take 10 mg by mouth at bedtime as needed.      . pantoprazole (PROTONIX) 40 MG tablet Take 40 mg by mouth daily.        . paroxetine mesylate (PEXEVA) 40 MG tablet Take 20 mg by mouth every morning.        . zolpidem (AMBIEN) 10 MG tablet Take 10 mg by mouth at bedtime as needed.        Allergies  Allergen Reactions  . Nitrofurantoin Monohyd Macro     Past Medical History    Diagnosis Date  . Hypertension   . Hyperlipidemia   . Diabetes mellitus type II   . GERD (gastroesophageal reflux disease)   . Obesity   . Bladder tumor     Recurrent  . MI (myocardial infarction) 1994    Inferior, PCI and Streptokinase  . Tobacco user     Quit 1994  . Wide-complex tachycardia     Past Surgical History  Procedure Date  . Cystectomy     For bladder cancer x2    Family History  Problem Relation Age of Onset  . Coronary artery disease Other     History   Social History  . Marital Status: Married    Spouse Name: N/A    Number of Children: N/A  . Years of Education: N/A   Occupational History  . [employer] Par[employer]ic  in [place] [employer]   Social History Main Topics  . Smoking status: Former Smoker    Quit date: 01/13/1992  . Smokeless tobacco: Not on file  . Alcohol Use: Not on file  . Drug Use: Not on file  . Sexually Active: Not on file   Other Topics Concern  . Not on file   Social History Narrative   Married 2 grown children    ROS: Please see the HPI.  All other systems reviewed and negative.  PHYSICAL EXAM:  BP 143/73  Pulse 57  Ht 5\' 7"  (1.702 m)  Wt 190 lb (86.183 kg)  BMI 29.76 kg/m2  General: Well developed, well nourished, in no acute distress. Head:  Normocephalic and atraumatic. Neck: no JVD Lungs: Clear to auscultation and percussion. Heart: Normal S1 and S2.  No murmur, rubs or gallops.  Abdomen:  Normal bowel sounds; soft; non tender; no organomegaly Pulses: Pulses normal in all 4 extremities. Extremities: No clubbing or cyanosis. No edema. Neurologic: Alert and oriented x 3.  EKG:  SB.  Inferior MI, old.  T wave inversion in I, AVL, and V5-6.  This is unchanged from prior tracings.   Cath 2007   ANGIOGRAPHIC DATA:  1. Ventriculography was done in the RAO projection. There is severe  inferobasal hypo to near akinesis with an ejection fraction of 41%.  2. The left main is free of critical disease.   3. The left anterior descending artery has a fairly typical diabetic  appearance and is small in caliber. There is probably 40% narrowing  overlapping the origin of septal. The septal itself has slow distal  flow. There is a tiny proximal diagonal which is without critical  disease.  4. The circumflex intermediate vessel that is a previously stented, the  stent is widely patent without evidence of significant recurrence.  There is about 30-40% narrowing just distal to the stent before the  bifurcation of the intermediate.  5. 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 It provides a pair of  circumflex marginal branches. In the proximal area, there are tandem 70%  lesions. Following stenting, this is reduced to 0% with an excellent  angiographic appearance.  6. The right coronary artery has been previously stented. There is diffuse  luminal irregularity throughout the vessel. Proximally, where the stent  is placed, there is less than 20% narrowing. The mid vessel also has  less than 20%. Near the acute margin, there is an area of segmental  plaque of about 40-50%. There is also 40% narrowing across the origin  of the PDA. There is tandem areas of 40%, 40%, and 50% narrowing also  throughout the proximal portion of the posterolateral system.  CONCLUSION:  1. Moderate reduction in left ventricular function with an inferobasal wall  motion abnormality and ejection fraction of 41%.  2. 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  3. No evidence of restenosis in the previously placed stents in the  intermediate vessel and in the proximal and mid right coronaries.  4. No evidence of significant progression of disease in what appears to be  a diabetic appearing left anterior descending vessel.  DISPOSITION: The patient will need at least a month of Plavix. Following  this, it can be discontinued and he can be maintained on low  dose aspirin  which is worked previously in the past.  Maisie Fus D. Riley Kill, M.D. [facility]  Electronically Signed  TDS/MEDQ D:  03/13/2005 T: 03/13/2005 Job: [id number]    ASSESSMENT AND PLAN:

## 2011-09-28 ENCOUNTER — Encounter (HOSPITAL_COMMUNITY): Payer: Self-pay | Admitting: Pharmacy Technician

## 2011-10-02 ENCOUNTER — Encounter (HOSPITAL_COMMUNITY): Payer: Self-pay | Admitting: Cardiology

## 2011-10-02 ENCOUNTER — Ambulatory Visit (HOSPITAL_COMMUNITY)
Admission: RE | Admit: 2011-10-02 | Discharge: 2011-10-02 | Disposition: A | Discharge status: Home / Self Care | Payer: Medicare Other | Source: Ambulatory Visit | Attending: Cardiology | Admitting: Cardiology

## 2011-10-02 ENCOUNTER — Encounter (HOSPITAL_COMMUNITY)
Admission: RE | Disposition: A | Discharge status: Home / Self Care | Payer: Self-pay | Source: Ambulatory Visit | Attending: Cardiology

## 2011-10-02 DIAGNOSIS — E669 Obesity, unspecified: Secondary | ICD-10-CM | POA: Diagnosis not present

## 2011-10-02 DIAGNOSIS — Z794 Long term (current) use of insulin: Secondary | ICD-10-CM | POA: Diagnosis not present

## 2011-10-02 DIAGNOSIS — I2510 Atherosclerotic heart disease of native coronary artery without angina pectoris: Secondary | ICD-10-CM

## 2011-10-02 DIAGNOSIS — K219 Gastro-esophageal reflux disease without esophagitis: Secondary | ICD-10-CM | POA: Diagnosis not present

## 2011-10-02 DIAGNOSIS — E119 Type 2 diabetes mellitus without complications: Secondary | ICD-10-CM | POA: Diagnosis not present

## 2011-10-02 DIAGNOSIS — Z79899 Other long term (current) drug therapy: Secondary | ICD-10-CM | POA: Diagnosis not present

## 2011-10-02 DIAGNOSIS — E785 Hyperlipidemia, unspecified: Secondary | ICD-10-CM | POA: Insufficient documentation

## 2011-10-02 DIAGNOSIS — I209 Angina pectoris, unspecified: Secondary | ICD-10-CM | POA: Diagnosis not present

## 2011-10-02 DIAGNOSIS — I10 Essential (primary) hypertension: Secondary | ICD-10-CM | POA: Insufficient documentation

## 2011-10-02 HISTORY — PX: LEFT HEART CATHETERIZATION WITH CORONARY ANGIOGRAM: SHX5451

## 2011-10-02 LAB — GLUCOSE, CAPILLARY: Glucose-Capillary: 141 mg/dL — ABNORMAL HIGH (ref 70–99)

## 2011-10-02 SURGERY — LEFT HEART CATHETERIZATION WITH CORONARY ANGIOGRAM
Anesthesia: LOCAL

## 2011-10-02 MED ORDER — ASPIRIN 81 MG PO CHEW
CHEWABLE_TABLET | ORAL | Status: AC
  Filled 2011-10-02: qty 4

## 2011-10-02 MED ORDER — SODIUM CHLORIDE 0.9 % IJ SOLN: 3.0000 mL | Freq: Two times a day (BID) | INTRAMUSCULAR | Status: DC

## 2011-10-02 MED ORDER — LIDOCAINE HCL (PF) 1 % IJ SOLN
INTRAMUSCULAR | Status: AC
  Filled 2011-10-02: qty 30

## 2011-10-02 MED ORDER — ASPIRIN 81 MG PO CHEW
324.0000 mg | CHEWABLE_TABLET | ORAL | Status: AC
  Administered 2011-10-02: 324 mg via ORAL

## 2011-10-02 MED ORDER — SODIUM CHLORIDE 0.9 % IJ SOLN: 3.0000 mL | INTRAMUSCULAR | Status: DC | PRN

## 2011-10-02 MED ORDER — NITROGLYCERIN 0.2 MG/ML ON CALL CATH LAB
INTRAVENOUS | Status: AC
  Filled 2011-10-02: qty 1

## 2011-10-02 MED ORDER — SODIUM CHLORIDE 0.9 % IV SOLN: 250.0000 mL | INTRAVENOUS | Status: DC | PRN

## 2011-10-02 MED ORDER — DIAZEPAM 5 MG PO TABS
ORAL_TABLET | ORAL | Status: AC
  Filled 2011-10-02: qty 1

## 2011-10-02 MED ORDER — SODIUM CHLORIDE 0.9 % IV SOLN
INTRAVENOUS | Status: DC
  Administered 2011-10-02: 1000 mL via INTRAVENOUS

## 2011-10-02 MED ORDER — FENTANYL CITRATE 0.05 MG/ML IJ SOLN
INTRAMUSCULAR | Status: AC
  Filled 2011-10-02: qty 2

## 2011-10-02 MED ORDER — HEPARIN (PORCINE) IN NACL 2-0.9 UNIT/ML-% IJ SOLN
INTRAMUSCULAR | Status: AC
  Filled 2011-10-02: qty 1000

## 2011-10-02 MED ORDER — ACETAMINOPHEN 325 MG PO TABS: 650.0000 mg | ORAL_TABLET | ORAL | Status: DC | PRN

## 2011-10-02 MED ORDER — ONDANSETRON HCL 4 MG/2ML IJ SOLN: 4.0000 mg | Freq: Four times a day (QID) | INTRAMUSCULAR | Status: DC | PRN

## 2011-10-02 MED ORDER — MIDAZOLAM HCL 2 MG/2ML IJ SOLN
INTRAMUSCULAR | Status: AC
  Filled 2011-10-02: qty 2

## 2011-10-02 MED ORDER — SODIUM CHLORIDE 0.9 % IV SOLN: INTRAVENOUS | Status: DC

## 2011-10-02 MED ORDER — DIAZEPAM 5 MG PO TABS
5.0000 mg | ORAL_TABLET | ORAL | Status: AC
  Administered 2011-10-02: 5 mg via ORAL

## 2011-10-02 NOTE — Interval H&P Note (Signed)
History and Physical Interval Note:  10/02/2011 7:44 AM  Carl Stephenson  has presented today for surgery, with the diagnosis of Chest pain  The various methods of treatment have been discussed with the patient and family. After consideration of risks, benefits and other options for treatment, the patient has consented to  Procedure(s) (LRB) with comments: LEFT HEART CATHETERIZATION WITH CORONARY ANGIOGRAM (N/A) as a surgical intervention .  The patient's history has been reviewed, patient examined, no change in status, stable for surgery.  I have reviewed the patient's chart and labs.  Questions were answered to the patient's satisfaction.     [name]  Of note, patient has a low MCV and has not been taking clopidogrel for over a year.  Will put into context of treatment plans.

## 2011-10-02 NOTE — Progress Notes (Signed)
Groin stable.  Instructions given.  Continue medical therapy and follow up with me.

## 2011-10-02 NOTE — H&P (Signed)
Carl Pons, MD  09/27/2011  2:34 PM  Signed    HPI:   Discharge is in for followup. In general, he is tired more quickly. He has noticed some pain in the upper chest, and this is not dissimilar from what he has had in the past. It is similar in quality, and he is noticed it more over the past month or two.  Both he and his wife expressed some concern based on his prior history.    Current Outpatient Prescriptions   Medication  Sig  Dispense  Refill   .  aspirin 81 MG tablet  Take 81 mg by mouth daily.         [name]  atorvastatin (LIPITOR) 20 MG tablet  Take 20 mg by mouth daily.           .  carvedilol (COREG) 25 MG tablet  Take 12.5 mg by mouth 2 (two) times daily with a meal.           .  clonazePAM (KLONOPIN) 1 MG tablet  Take 1 mg by mouth 2 (two) times daily as needed.           .  clopidogrel (PLAVIX) 75 MG tablet  Take 75 mg by mouth daily.           [name]  ezetimibe (ZETIA) 10 MG tablet  Take 10 mg by mouth daily.           .  fexofenadine (ALLEGRA) 180 MG tablet  Take 180 mg by mouth daily.         [name]  gemfibrozil (LOPID) 600 MG tablet  Take 600 mg by mouth 2 (two) times daily.          [name]  glimepiride (AMARYL) 4 MG tablet  Take 1 1/2 tablets daily         .  insulin lispro (HUMALOG) 100 UNIT/ML injection  Inject into the skin 3 (three) times daily before meals.           .  isosorbide mononitrate (IMDUR) 30 MG 24 hr tablet  Take 30 mg by mouth daily.           .  metFORMIN (GLUCOPHAGE) 850 MG tablet  Take 850 mg by mouth 3 (three) times daily.           .  nitroGLYCERIN (NITROSTAT) 0.4 MG SL tablet  Place 0.4 mg under the tongue every 5 (five) minutes as needed. May repeat for up to 3 doses.          [name]  oxazepam (SERAX) 10 MG capsule  Take 10 mg by mouth at bedtime as needed.         .  pantoprazole (PROTONIX) 40 MG tablet  Take 40 mg by mouth daily.           .  paroxetine mesylate (PEXEVA) 40 MG tablet  Take 20 mg by mouth every morning.           .  zolpidem (AMBIEN) 10 MG tablet   Take 10 mg by mouth at bedtime as needed.             Allergies   Allergen  Reactions   .  Nitrofurantoin Monohyd Macro         Past Medical History   Diagnosis  Date   .  Hypertension     .  Hyperlipidemia     .  Diabetes mellitus type II     .  GERD (gastroesophageal reflux disease)     .  Obesity     .  Bladder tumor         Recurrent   .  MI (myocardial infarction)  1994       Inferior, PCI and Streptokinase   .  Tobacco user         Quit 1994   .  Wide-complex tachycardia         Past Surgical History   Procedure  Date   .  Cystectomy         For bladder cancer x2       Family History   Problem  Relation  Age of Onset   .  Coronary artery disease  Other         History       Social History   .  Marital Status:  Married       Spouse Name:  N/A       Number of Children:  N/A   .  Years of Education:  N/A       Occupational History   .  [employer] in   [place] [employer]       Social History Main Topics   .  Smoking status:  Former Smoker       Quit date:  01/13/1992   .  Smokeless tobacco:  Not on file   .  Alcohol Use:  Not on file   .  Drug Use:  Not on file   .  Sexually Active:  Not on file       Other Topics  Concern   .  Not on file       Social History Narrative     Married 2 grown children      ROS: Please see the HPI.  All other systems reviewed and negative.   PHYSICAL EXAM:   BP 143/73  Pulse 57  Ht 5\' 7"  (1.702 m)  Wt 190 lb (86.183 kg)  BMI 29.76 kg/m2   General: Well developed, well nourished, in no acute distress. Head:  Normocephalic and atraumatic. Neck: no JVD Lungs: Clear to auscultation and percussion. Heart: Normal S1 and S2.  No murmur, rubs or gallops.   Abdomen:  Normal bowel sounds; soft; non tender; no organomegaly Pulses: Pulses normal in all 4 extremities. Extremities: No clubbing or cyanosis. No edema. Neurologic: Alert and oriented x 3.   EKG:  SB.  Inferior MI, old.  T wave  inversion in I, AVL, and V5-6.  This is unchanged from prior tracings.    Cath 2007    ANGIOGRAPHIC DATA:   1. Ventriculography was done in the RAO projection. There is severe   inferobasal hypo to near akinesis with an ejection fraction of 41%.   2. The left main is free of critical disease.   3. The left anterior descending artery has a fairly typical diabetic   appearance and is small in caliber. There is probably 40% narrowing   overlapping the origin of septal. The septal itself has slow distal   flow. There is a tiny proximal diagonal which is without critical   disease.   4. The circumflex intermediate vessel that is a previously stented, the   stent is widely patent without evidence of significant recurrence.   There is about 30-40% narrowing just distal to the stent before the   bifurcation of the intermediate.   5. 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 It provides a pair of   circumflex marginal branches. In the proximal area, there are tandem 70%   lesions. Following stenting, this is reduced to 0% with an excellent   angiographic appearance.   6. The right coronary artery has been previously stented. There is diffuse   luminal irregularity throughout the vessel. Proximally, where the stent   is placed, there is less than 20% narrowing. The mid vessel also has   less than 20%. Near the acute margin, there is an area of segmental   plaque of about 40-50%. There is also 40% narrowing across the origin   of the PDA. There is tandem areas of 40%, 40%, and 50% narrowing also   throughout the proximal portion of the posterolateral system.   CONCLUSION:   1. Moderate reduction in left ventricular function with an inferobasal wall   motion abnormality and ejection fraction of 41%.   2. 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   3. No evidence of restenosis in the previously placed stents in the     intermediate vessel and in the proximal and mid right coronaries.   4. No evidence of significant progression of disease in what appears to be   a diabetic appearing left anterior descending vessel.   DISPOSITION: The patient will need at least a month of Plavix. Following   this, it can be discontinued and he can be maintained on low dose aspirin   which is worked previously in the past.   Maisie Fus D. Riley Kill, M.D. [facility]   Electronically Signed   TDS/MEDQ D: 03/13/2005 T: 03/13/2005 Job: [id number]      ASSESSMENT AND PLAN:     CAD, NATIVE VESSEL - Carl Pons, MD  09/27/2011  2:25 PM  Signed Patient is in for follow up.  His symptoms sound similar to what he has had in the past, and he has multiple risk factors for progression of disease.  I have recommended repeat cardiac catheterization for further evaluation. The patient has had this, as well as multiple prior interventions, and is agreeable to proceed. He understands the risks, benefits, and alternatives all too well. We will obtain laboratory studies make arrangements for next week.  DIABETES MELLITUS, TYPE II, HX OF - Carl Pons, MD  09/27/2011  2:27 PM  Signed Managed by his primary provider.     HYPERLIPIDEMIA-MIXED Carl Pons, MD  09/27/2011  2:28 PM  Signed Has remained on a multidrug strategy for a long time.        Electronic signature on 09/25/2011

## 2011-10-02 NOTE — Interval H&P Note (Signed)
History and Physical Interval Note:  10/02/2011 7:43 AM  Carl Stephenson  has presented today for surgery, with the diagnosis of Chest pain  The various methods of treatment have been discussed with the patient and family. After consideration of risks, benefits and other options for treatment, the patient has consented to  Procedure(s) (LRB) with comments: LEFT HEART CATHETERIZATION WITH CORONARY ANGIOGRAM (N/A) as a surgical intervention .  The patient's history has been reviewed, patient examined, no change in status, stable for surgery.  I have reviewed the patient's chart and labs.  Questions were answered to the patient's satisfaction.     [name]

## 2011-10-02 NOTE — Progress Notes (Signed)
Pt walked in hallway without difficulty, right groin site remains stable.

## 2011-10-02 NOTE — CV Procedure (Signed)
   Cardiac Catheterization Procedure Note  Name: Carl Stephenson MRN: [id number] DOB: 17-Jan-1945  Procedure: Left Heart Cath, Selective Coronary Angiography, LV angiography  Indication: prior stenting with angina, class II-III   Procedural details: The right groin was prepped, draped, and anesthetized with 1% lidocaine. Using modified Seldinger technique, a 5 French sheath was introduced into the right femoral artery. Standard Judkins catheters were used for coronary angiography and left ventriculography. Catheter exchanges were performed over a guidewire. There were no immediate procedural complications. The patient was transferred to the post catheterization recovery area for further monitoring.  Procedural Findings: Hemodynamics:  AO 155/77 (105) LV 159/19 No gradient   Coronary angiography: Coronary dominance: right  Left mainstem: Calcified without significant focal narrowing.   Left anterior descending (LAD): Courses to the apex.  It is smaller in caliber and has moderate calcification.  After the septal there is 40-50% narrowing, and then 50-60% at the takeoff of the diagonal branch which is small.  The distal LAD does not wrap the apex.    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  Both have been stented, the ramus in 2005 and the anomalous branch in 2007.  The ramus bifurcates to a small branch and large branch.  The small branch is without narrowing.  The large branch has a stent with about 40% narrowing in the distal aspect of the stent.  It does not appear significantly compromised.  It divides distally to two smaller branches without significant narrowing.  The anomalous stent is also patent with 30-40% distal narrowing in the stent.  It provides two marginal branches that have minor irregularity  Right coronary artery (RCA): It has been extensively stented with three  prior stents.  The proximal stent is widely patent.  The mid stent is also widely patent.  Prior to the third stent, there is 30-40% smooth narrowing in the native vessel and distally in the second stent at the acute margin.  After this, there is another stent that has about 50% in stent narrowing.  The vessel then provides a PDA with mild luminal irregularity.  The AV branch has about 60% narrowing just after the PDA, and the AV branch has a 70-75 % area of narrowing at a bend leading in to the larger PLA artery.  This is similar to slightly progressed from the prior study.    Left ventriculography: Left ventricular systolic function is mildly reduced, LVEF is estimated at 50%, there is no significant mitral regurgitation.  There is inferior hypokinesis..  Final Conclusions:   1.  Continue patency of the ramus and anomalous CFX stents 2.  Continue patency of the proximal and mid RCA stents. 3.  Mild in stent renarrowing of the distal RCA stent with moderate disease involving the AV branch after the PDA as noted. 4.  Preserved overall LV function.    Recommendations:  1.  Continued medical therapy 2.  Iron studies with low MCV\ 3.  Remain off of clopidogrel as he has not taken for a year.  4.  Early office followup with poss PCI for progressive symptoms.    [name] 10/02/2011, 8:55 AM

## 2011-10-08 DIAGNOSIS — N400 Enlarged prostate without lower urinary tract symptoms: Secondary | ICD-10-CM | POA: Diagnosis not present

## 2011-10-08 DIAGNOSIS — N390 Urinary tract infection, site not specified: Secondary | ICD-10-CM | POA: Diagnosis not present

## 2011-10-08 DIAGNOSIS — R3129 Other microscopic hematuria: Secondary | ICD-10-CM | POA: Diagnosis not present

## 2011-10-08 DIAGNOSIS — C679 Malignant neoplasm of bladder, unspecified: Secondary | ICD-10-CM | POA: Diagnosis not present

## 2011-10-12 DIAGNOSIS — Z23 Encounter for immunization: Secondary | ICD-10-CM | POA: Diagnosis not present

## 2011-10-13 ENCOUNTER — Ambulatory Visit: Payer: Medicare Other | Admitting: Cardiology

## 2011-10-21 ENCOUNTER — Encounter: Payer: Self-pay | Admitting: Cardiology

## 2011-10-21 ENCOUNTER — Ambulatory Visit (INDEPENDENT_AMBULATORY_CARE_PROVIDER_SITE_OTHER): Payer: Medicare Other | Admitting: Cardiology

## 2011-10-21 VITALS — BP 133/64 | HR 60 | Resp 18 | Ht 66.0 in | Wt 187.0 lb

## 2011-10-21 DIAGNOSIS — I2510 Atherosclerotic heart disease of native coronary artery without angina pectoris: Secondary | ICD-10-CM

## 2011-10-21 DIAGNOSIS — R718 Other abnormality of red blood cells: Secondary | ICD-10-CM

## 2011-10-21 DIAGNOSIS — I10 Essential (primary) hypertension: Secondary | ICD-10-CM | POA: Diagnosis not present

## 2011-10-21 DIAGNOSIS — E785 Hyperlipidemia, unspecified: Secondary | ICD-10-CM | POA: Diagnosis not present

## 2011-10-31 NOTE — Progress Notes (Signed)
HPI:  The patient returns to review the results of his cardiac catheterization. We reviewed these in great detail. Overall, he is stable. We reviewed the potential causes of symptoms, I am quite quite pleased by the anatomic result.   Current Outpatient Prescriptions  Medication Sig Dispense Refill  . aspirin 81 MG tablet Take 81 mg by mouth daily.      [name] atorvastatin (LIPITOR) 20 MG tablet Take 20 mg by mouth daily.        . carvedilol (COREG) 25 MG tablet Take 12.5 mg by mouth 2 (two) times daily with a meal.        . ezetimibe (ZETIA) 10 MG tablet Take 10 mg by mouth daily.        . fexofenadine (ALLEGRA) 180 MG tablet Take 180 mg by mouth daily.      [name] gemfibrozil (LOPID) 600 MG tablet Take 600 mg by mouth 2 (two) times daily.       [name] glimepiride (AMARYL) 4 MG tablet Take 6 mg by mouth daily before breakfast.      . insulin lispro (HUMALOG) 100 UNIT/ML injection Inject 45 Units into the skin at bedtime.       . isosorbide mononitrate (IMDUR) 30 MG 24 hr tablet Take 30 mg by mouth daily.        . metFORMIN (GLUCOPHAGE) 850 MG tablet Take 850 mg by mouth 3 (three) times daily.        . nitroGLYCERIN (NITROSTAT) 0.4 MG SL tablet Place 0.4 mg under the tongue every 5 (five) minutes as needed. May repeat for up to 3 doses.       . pantoprazole (PROTONIX) 40 MG tablet Take 40 mg by mouth daily.        . paroxetine mesylate (PEXEVA) 40 MG tablet Take 20 mg by mouth every morning.        . VESICARE 5 MG tablet       . zolpidem (AMBIEN) 10 MG tablet Take 10 mg by mouth at bedtime as needed. For sleep        Allergies  Allergen Reactions  . Nitrofurantoin Monohyd Macro Hives and Rash    Past Medical History  Diagnosis Date  . Hypertension   . Hyperlipidemia   . Diabetes mellitus type II   . GERD (gastroesophageal reflux disease)   . Obesity   . Bladder tumor     Recurrent  . MI (myocardial infarction) 1994    Inferior, PCI and Streptokinase  . Tobacco user     Quit 1994  .  Wide-complex tachycardia     Past Surgical History  Procedure Date  . Cystectomy     For bladder cancer x2    Family History  Problem Relation Age of Onset  . Coronary artery disease Other     History   Social History  . Marital Status: Married    Spouse Name: N/A    Number of Children: N/A  . Years of Education: N/A   Occupational History  . [employer] Par[employer]ic in [place] [employer]   Social History Main Topics  . Smoking status: Former Smoker    Quit date: 01/13/1992  . Smokeless tobacco: Not on file  . Alcohol Use: Not on file  . Drug Use: Not on file  . Sexually Active: Not on file   Other Topics Concern  . Not on file   Social History Narrative   Married 2 grown children    ROS: Please  see the HPI.  All other systems reviewed and negative.  PHYSICAL EXAM:  BP 133/64  Pulse 60  Resp 18  Ht 5\' 6"  (1.676 m)  Wt 187 lb (84.823 kg)  BMI 30.18 kg/m2  SpO2 96%  General: Well developed, well nourished, in no acute distress. Head:  Normocephalic and atraumatic. Neck: no JVD Lungs: Clear to auscultation and percussion. Heart: Normal S1 and S2. Minimal SEM.  No diastolic murmur.  Groin site looks good.  Pulses: Pulses normal in all 4 extremities. Extremities: No clubbing or cyanosis. No edema. Neurologic: Alert and oriented x 3.  EKG:  NSR.  Anterior and inferior MI, age indeterminate.  No acute changes.  CATH STUDY (10/02/11)  Left mainstem: Calcified without significant focal narrowing.  Left anterior descending (LAD): Courses to the apex. It is smaller in caliber and has moderate calcification. After the septal there is 40-50% narrowing, and then 50-60% at the takeoff of the diagonal branch which is small. The distal LAD does not wrap the apex.  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 Both have been stented, the ramus in 2005 and the  anomalous branch in 2007. The ramus bifurcates to a small branch and large branch. The small branch is without narrowing. The large branch has a stent with about 40% narrowing in the distal aspect of the stent. It does not appear significantly compromised. It divides distally to two smaller branches without significant narrowing. The anomalous stent is also patent with 30-40% distal narrowing in the stent. It provides two marginal branches that have minor irregularity  Right coronary artery (RCA): It has been extensively stented with three prior stents. The proximal stent is widely patent. The mid stent is also widely patent. Prior to the third stent, there is 30-40% smooth narrowing in the native vessel and distally in the second stent at the acute margin. After this, there is another stent that has about 50% in stent narrowing. The vessel then provides a PDA with mild luminal irregularity. The AV branch has about 60% narrowing just after the PDA, and the AV branch has a 70-75 % area of narrowing at a bend leading in to the larger PLA artery. This is similar to slightly progressed from the prior study.  Left ventriculography: Left ventricular systolic function is mildly reduced, LVEF is estimated at 50%, there is no significant mitral regurgitation. There is inferior hypokinesis..  Final Conclusions:  1. Continue patency of the ramus and anomalous CFX stents  2. Continue patency of the proximal and mid RCA stents.  3. Mild in stent renarrowing of the distal RCA stent with moderate disease involving the AV branch after the PDA as noted.  4. Preserved overall LV function.  Recommendations:  1. Continued medical therapy  2. Iron studies with low MCV\  3. Remain off of clopidogrel as he has not taken for a year.  4. Early office followup with poss PCI for progressive symptoms.       ASSESSMENT AND PLAN:

## 2011-11-03 ENCOUNTER — Encounter: Payer: Self-pay | Admitting: Cardiology

## 2011-11-03 DIAGNOSIS — E119 Type 2 diabetes mellitus without complications: Secondary | ICD-10-CM | POA: Diagnosis not present

## 2011-11-03 DIAGNOSIS — E782 Mixed hyperlipidemia: Secondary | ICD-10-CM | POA: Diagnosis not present

## 2011-11-07 DIAGNOSIS — R718 Other abnormality of red blood cells: Secondary | ICD-10-CM | POA: Insufficient documentation

## 2011-11-07 NOTE — Assessment & Plan Note (Signed)
Controlled.  

## 2011-11-07 NOTE — Assessment & Plan Note (Signed)
Managed by primary provider.

## 2011-11-07 NOTE — Assessment & Plan Note (Signed)
Hgb normal.  MCV is low.  Will need to check with primary provider about past studies.  It was borderline low one year ago. Will forward to his primary

## 2011-11-07 NOTE — Assessment & Plan Note (Signed)
He is stable at the present time.

## 2011-11-09 DIAGNOSIS — E119 Type 2 diabetes mellitus without complications: Secondary | ICD-10-CM | POA: Diagnosis not present

## 2011-11-30 NOTE — Progress Notes (Signed)
I spoke with Toniann Fail in Dr Roby Lofts office and made her aware of Dr Rosalyn Charters comments.  She looked at the pt's chart and said that Dr Lorin Picket did get the information that was sent to him by Dr Riley Kill.  I will still fax this pt's office visit and labs.

## 2011-11-30 NOTE — Progress Notes (Signed)
Carl Stephenson Can you call his primary MD nurse, and have them get iron studies and stool cards on him if appropriate, and let the patient know that we are checking mainly because of the small cell size. Thanks. TS  I have left 2 messages on the nurse line at Dr Roby Lofts office about this patient. I spoke with the pt and made him aware of Dr Rosalyn Charters recommendations.  The pt said he is scheduled to see Dr Lorin Picket on 12/07/11.  I will fax this office note and the 09/25/11 lab results to Dr Roby Lofts office for review.

## 2011-12-07 ENCOUNTER — Encounter: Payer: Self-pay | Admitting: Cardiology

## 2011-12-07 DIAGNOSIS — D539 Nutritional anemia, unspecified: Secondary | ICD-10-CM | POA: Diagnosis not present

## 2011-12-07 DIAGNOSIS — E119 Type 2 diabetes mellitus without complications: Secondary | ICD-10-CM | POA: Diagnosis not present

## 2011-12-07 DIAGNOSIS — R718 Other abnormality of red blood cells: Secondary | ICD-10-CM | POA: Diagnosis not present

## 2012-01-21 DIAGNOSIS — R3129 Other microscopic hematuria: Secondary | ICD-10-CM | POA: Diagnosis not present

## 2012-01-21 DIAGNOSIS — N390 Urinary tract infection, site not specified: Secondary | ICD-10-CM | POA: Diagnosis not present

## 2012-01-21 DIAGNOSIS — N400 Enlarged prostate without lower urinary tract symptoms: Secondary | ICD-10-CM | POA: Diagnosis not present

## 2012-01-21 DIAGNOSIS — C679 Malignant neoplasm of bladder, unspecified: Secondary | ICD-10-CM | POA: Diagnosis not present

## 2012-02-04 DIAGNOSIS — E119 Type 2 diabetes mellitus without complications: Secondary | ICD-10-CM | POA: Diagnosis not present

## 2012-03-15 ENCOUNTER — Encounter: Payer: Self-pay | Admitting: Cardiology

## 2012-03-15 ENCOUNTER — Ambulatory Visit (INDEPENDENT_AMBULATORY_CARE_PROVIDER_SITE_OTHER): Payer: Medicare Other | Admitting: Cardiology

## 2012-03-15 VITALS — BP 130/64 | HR 52 | Ht 66.0 in | Wt 189.0 lb

## 2012-03-15 DIAGNOSIS — E785 Hyperlipidemia, unspecified: Secondary | ICD-10-CM | POA: Diagnosis not present

## 2012-03-15 DIAGNOSIS — R718 Other abnormality of red blood cells: Secondary | ICD-10-CM | POA: Diagnosis not present

## 2012-03-15 DIAGNOSIS — I10 Essential (primary) hypertension: Secondary | ICD-10-CM

## 2012-03-15 DIAGNOSIS — I2510 Atherosclerotic heart disease of native coronary artery without angina pectoris: Secondary | ICD-10-CM | POA: Diagnosis not present

## 2012-03-15 LAB — CBC WITH DIFFERENTIAL/PLATELET
Eosinophils Relative: 4 % (ref 0.0–5.0)
Lymphocytes Relative: 25.6 % (ref 12.0–46.0)
Monocytes Relative: 7.3 % (ref 3.0–12.0)
Neutrophils Relative %: 62.5 % (ref 43.0–77.0)
Platelets: 200 10*3/uL (ref 150.0–400.0)
WBC: 10.4 10*3/uL (ref 4.5–10.5)

## 2012-03-15 NOTE — Assessment & Plan Note (Signed)
He has remained stable so a continued medical therapy would be recommended.

## 2012-03-15 NOTE — Progress Notes (Signed)
HPI:  Patient is in for followup. Than being tired, he has got along reasonably well. He denies any chest pain. We had him back after his last catheterization discussed the findings. He is noted to have microcytic indices, and a followup iron and Dr. Roby Lofts office revealed a slightly low iron level.  He has not been taking any oral iron replacement.  He also has not had stools.      Current Outpatient Prescriptions  Medication Sig Dispense Refill  . aspirin 81 MG tablet Take 81 mg by mouth daily.      [name] atorvastatin (LIPITOR) 20 MG tablet Take 20 mg by mouth daily.        . carvedilol (COREG) 25 MG tablet Take 12.5 mg by mouth 2 (two) times daily with a meal.        . ezetimibe (ZETIA) 10 MG tablet Take 10 mg by mouth daily.        . fexofenadine (ALLEGRA) 180 MG tablet Take 180 mg by mouth daily.      [name] gemfibrozil (LOPID) 600 MG tablet Take 600 mg by mouth 2 (two) times daily.       [name] glimepiride (AMARYL) 4 MG tablet Take 6 mg by mouth daily before breakfast.      . insulin lispro (HUMALOG) 100 UNIT/ML injection Inject 45 Units into the skin at bedtime.       . isosorbide mononitrate (IMDUR) 30 MG 24 hr tablet Take 30 mg by mouth daily.        . metFORMIN (GLUCOPHAGE) 850 MG tablet Take 850 mg by mouth 3 (three) times daily.        . nitroGLYCERIN (NITROSTAT) 0.4 MG SL tablet Place 0.4 mg under the tongue every 5 (five) minutes as needed. May repeat for up to 3 doses.       . pantoprazole (PROTONIX) 40 MG tablet Take 40 mg by mouth daily.        . paroxetine mesylate (PEXEVA) 40 MG tablet Take 20 mg by mouth every morning.        . VESICARE 5 MG tablet       . zolpidem (AMBIEN) 10 MG tablet Take 10 mg by mouth at bedtime as needed. For sleep       No current facility-administered medications for this visit.    Allergies  Allergen Reactions  . Nitrofurantoin Monohyd Macro Hives and Rash    Past Medical History  Diagnosis Date  . Hypertension   . Hyperlipidemia   . Diabetes  mellitus type II   . GERD (gastroesophageal reflux disease)   . Obesity   . Bladder tumor     Recurrent  . MI (myocardial infarction) 1994    Inferior, PCI and Streptokinase  . Tobacco user     Quit 1994  . Wide-complex tachycardia     Past Surgical History  Procedure Laterality Date  . Cystectomy      For bladder cancer x2    Family History  Problem Relation Age of Onset  . Coronary artery disease Other     History   Social History  . Marital Status: Married    Spouse Name: N/A    Number of Children: N/A  . Years of Education: N/A   Occupational History  . [employer] Par[employer]ic in [place] [employer]   Social History Main Topics  . Smoking status: Former Smoker    Quit date: 01/13/1992  . Smokeless tobacco: Not on file  .  Alcohol Use: Not on file  . Drug Use: Not on file  . Sexually Active: Not on file   Other Topics Concern  . Not on file   Social History Narrative   Married    2 grown children    ROS: Please see the HPI.  All other systems reviewed and negative.  PHYSICAL EXAM:  BP 130/64  Pulse 52  Ht 5\' 6"  (1.676 m)  Wt 189 lb (85.73 kg)  BMI 30.52 kg/m2  SpO2 96%  General: Well developed, well nourished, in no acute distress. Head:  Normocephalic and atraumatic. Neck: no JVD Lungs: Clear to auscultation and percussion. Heart: Normal S1 and S2.  No murmur, rubs or gallops.  Abdomen:  Normal bowel sounds; soft; non tender; no organomegaly Pulses: Pulses normal in all 4 extremities. Extremities: No clubbing or cyanosis. No edema. Neurologic: Alert and oriented x 3.  EKG:  SB.  Lateral T inversion.  No change from prior tracing.    ASSESSMENT AND PLAN:

## 2012-03-15 NOTE — Patient Instructions (Addendum)
Your physician wants you to follow-up in: 6 months with Dr Excell Seltzer.   You will receive a reminder letter in the mail two months in advance. If you don't receive a letter, please call our office to schedule the follow-up appointment.  Your physician recommends that you return for lab work in: today (cbc)

## 2012-03-15 NOTE — Assessment & Plan Note (Signed)
This is well-controlled.

## 2012-03-15 NOTE — Assessment & Plan Note (Signed)
Iron was low, but he has not been on replacement treatment.  Will recheck level today and forward results to Dr. Lorin Picket.

## 2012-03-15 NOTE — Assessment & Plan Note (Signed)
Followed in the office of his primary physician.

## 2012-04-20 DIAGNOSIS — N400 Enlarged prostate without lower urinary tract symptoms: Secondary | ICD-10-CM | POA: Diagnosis not present

## 2012-04-20 DIAGNOSIS — R351 Nocturia: Secondary | ICD-10-CM | POA: Diagnosis not present

## 2012-04-20 DIAGNOSIS — N390 Urinary tract infection, site not specified: Secondary | ICD-10-CM | POA: Diagnosis not present

## 2012-04-20 DIAGNOSIS — C679 Malignant neoplasm of bladder, unspecified: Secondary | ICD-10-CM | POA: Diagnosis not present

## 2012-07-26 DIAGNOSIS — C679 Malignant neoplasm of bladder, unspecified: Secondary | ICD-10-CM | POA: Diagnosis not present

## 2012-07-26 DIAGNOSIS — D303 Benign neoplasm of bladder: Secondary | ICD-10-CM | POA: Diagnosis not present

## 2012-07-26 DIAGNOSIS — R3129 Other microscopic hematuria: Secondary | ICD-10-CM | POA: Diagnosis not present

## 2012-07-26 DIAGNOSIS — R351 Nocturia: Secondary | ICD-10-CM | POA: Diagnosis not present

## 2012-07-26 DIAGNOSIS — N390 Urinary tract infection, site not specified: Secondary | ICD-10-CM | POA: Diagnosis not present

## 2012-07-27 DIAGNOSIS — R319 Hematuria, unspecified: Secondary | ICD-10-CM | POA: Diagnosis not present

## 2012-10-04 DIAGNOSIS — L719 Rosacea, unspecified: Secondary | ICD-10-CM | POA: Diagnosis not present

## 2012-10-04 DIAGNOSIS — L219 Seborrheic dermatitis, unspecified: Secondary | ICD-10-CM | POA: Diagnosis not present

## 2012-10-10 DIAGNOSIS — Z23 Encounter for immunization: Secondary | ICD-10-CM | POA: Diagnosis not present

## 2012-10-27 DIAGNOSIS — R3129 Other microscopic hematuria: Secondary | ICD-10-CM | POA: Diagnosis not present

## 2012-10-27 DIAGNOSIS — C679 Malignant neoplasm of bladder, unspecified: Secondary | ICD-10-CM | POA: Diagnosis not present

## 2012-10-27 DIAGNOSIS — N390 Urinary tract infection, site not specified: Secondary | ICD-10-CM | POA: Diagnosis not present

## 2012-10-27 DIAGNOSIS — R319 Hematuria, unspecified: Secondary | ICD-10-CM | POA: Diagnosis not present

## 2012-10-27 DIAGNOSIS — N400 Enlarged prostate without lower urinary tract symptoms: Secondary | ICD-10-CM | POA: Diagnosis not present

## 2012-10-31 DIAGNOSIS — Z87891 Personal history of nicotine dependence: Secondary | ICD-10-CM | POA: Diagnosis not present

## 2012-10-31 DIAGNOSIS — Z79899 Other long term (current) drug therapy: Secondary | ICD-10-CM | POA: Diagnosis not present

## 2012-10-31 DIAGNOSIS — F411 Generalized anxiety disorder: Secondary | ICD-10-CM | POA: Diagnosis present

## 2012-10-31 DIAGNOSIS — K7689 Other specified diseases of liver: Secondary | ICD-10-CM | POA: Diagnosis not present

## 2012-10-31 DIAGNOSIS — I2510 Atherosclerotic heart disease of native coronary artery without angina pectoris: Secondary | ICD-10-CM | POA: Diagnosis present

## 2012-10-31 DIAGNOSIS — E119 Type 2 diabetes mellitus without complications: Secondary | ICD-10-CM | POA: Diagnosis not present

## 2012-10-31 DIAGNOSIS — E785 Hyperlipidemia, unspecified: Secondary | ICD-10-CM | POA: Diagnosis present

## 2012-10-31 DIAGNOSIS — I672 Cerebral atherosclerosis: Secondary | ICD-10-CM | POA: Diagnosis not present

## 2012-10-31 DIAGNOSIS — J9819 Other pulmonary collapse: Secondary | ICD-10-CM | POA: Diagnosis not present

## 2012-10-31 DIAGNOSIS — Z8551 Personal history of malignant neoplasm of bladder: Secondary | ICD-10-CM | POA: Diagnosis not present

## 2012-10-31 DIAGNOSIS — R911 Solitary pulmonary nodule: Secondary | ICD-10-CM | POA: Diagnosis not present

## 2012-10-31 DIAGNOSIS — R52 Pain, unspecified: Secondary | ICD-10-CM | POA: Diagnosis not present

## 2012-10-31 DIAGNOSIS — I252 Old myocardial infarction: Secondary | ICD-10-CM | POA: Diagnosis not present

## 2012-10-31 DIAGNOSIS — F329 Major depressive disorder, single episode, unspecified: Secondary | ICD-10-CM | POA: Diagnosis present

## 2012-10-31 DIAGNOSIS — I259 Chronic ischemic heart disease, unspecified: Secondary | ICD-10-CM | POA: Diagnosis not present

## 2012-10-31 DIAGNOSIS — Z794 Long term (current) use of insulin: Secondary | ICD-10-CM | POA: Diagnosis not present

## 2012-10-31 DIAGNOSIS — I10 Essential (primary) hypertension: Secondary | ICD-10-CM | POA: Diagnosis not present

## 2012-10-31 DIAGNOSIS — Z85828 Personal history of other malignant neoplasm of skin: Secondary | ICD-10-CM | POA: Diagnosis not present

## 2012-10-31 DIAGNOSIS — A419 Sepsis, unspecified organism: Secondary | ICD-10-CM | POA: Diagnosis not present

## 2012-10-31 DIAGNOSIS — R5381 Other malaise: Secondary | ICD-10-CM | POA: Diagnosis not present

## 2012-10-31 DIAGNOSIS — Z8673 Personal history of transient ischemic attack (TIA), and cerebral infarction without residual deficits: Secondary | ICD-10-CM | POA: Diagnosis not present

## 2012-10-31 DIAGNOSIS — R404 Transient alteration of awareness: Secondary | ICD-10-CM | POA: Diagnosis not present

## 2012-10-31 DIAGNOSIS — Z7982 Long term (current) use of aspirin: Secondary | ICD-10-CM | POA: Diagnosis not present

## 2012-10-31 DIAGNOSIS — N390 Urinary tract infection, site not specified: Secondary | ICD-10-CM | POA: Diagnosis not present

## 2012-10-31 DIAGNOSIS — E860 Dehydration: Secondary | ICD-10-CM | POA: Diagnosis not present

## 2012-11-09 DIAGNOSIS — Z09 Encounter for follow-up examination after completed treatment for conditions other than malignant neoplasm: Secondary | ICD-10-CM | POA: Diagnosis not present

## 2012-11-09 DIAGNOSIS — Z0000 Encounter for general adult medical examination without abnormal findings: Secondary | ICD-10-CM | POA: Diagnosis not present

## 2012-11-09 DIAGNOSIS — N3000 Acute cystitis without hematuria: Secondary | ICD-10-CM | POA: Diagnosis not present

## 2012-11-10 DIAGNOSIS — E782 Mixed hyperlipidemia: Secondary | ICD-10-CM | POA: Diagnosis not present

## 2012-11-10 DIAGNOSIS — Z006 Encounter for examination for normal comparison and control in clinical research program: Secondary | ICD-10-CM | POA: Diagnosis not present

## 2012-11-10 DIAGNOSIS — E119 Type 2 diabetes mellitus without complications: Secondary | ICD-10-CM | POA: Diagnosis not present

## 2012-11-10 DIAGNOSIS — I739 Peripheral vascular disease, unspecified: Secondary | ICD-10-CM | POA: Diagnosis not present

## 2013-01-27 DIAGNOSIS — E119 Type 2 diabetes mellitus without complications: Secondary | ICD-10-CM | POA: Diagnosis not present

## 2013-02-02 DIAGNOSIS — E119 Type 2 diabetes mellitus without complications: Secondary | ICD-10-CM | POA: Diagnosis not present

## 2013-02-02 DIAGNOSIS — N529 Male erectile dysfunction, unspecified: Secondary | ICD-10-CM | POA: Diagnosis not present

## 2013-02-23 DIAGNOSIS — C679 Malignant neoplasm of bladder, unspecified: Secondary | ICD-10-CM | POA: Diagnosis not present

## 2013-02-23 DIAGNOSIS — N390 Urinary tract infection, site not specified: Secondary | ICD-10-CM | POA: Diagnosis not present

## 2013-02-23 DIAGNOSIS — Z8551 Personal history of malignant neoplasm of bladder: Secondary | ICD-10-CM | POA: Diagnosis not present

## 2013-02-23 DIAGNOSIS — N3020 Other chronic cystitis without hematuria: Secondary | ICD-10-CM | POA: Diagnosis not present

## 2013-02-23 DIAGNOSIS — R3129 Other microscopic hematuria: Secondary | ICD-10-CM | POA: Diagnosis not present

## 2013-02-23 DIAGNOSIS — N400 Enlarged prostate without lower urinary tract symptoms: Secondary | ICD-10-CM | POA: Diagnosis not present

## 2013-04-06 DIAGNOSIS — R51 Headache: Secondary | ICD-10-CM | POA: Diagnosis not present

## 2013-04-06 DIAGNOSIS — R5383 Other fatigue: Secondary | ICD-10-CM | POA: Diagnosis not present

## 2013-04-06 DIAGNOSIS — R6889 Other general symptoms and signs: Secondary | ICD-10-CM | POA: Diagnosis not present

## 2013-04-06 DIAGNOSIS — E119 Type 2 diabetes mellitus without complications: Secondary | ICD-10-CM | POA: Diagnosis not present

## 2013-04-06 DIAGNOSIS — Z79899 Other long term (current) drug therapy: Secondary | ICD-10-CM | POA: Diagnosis not present

## 2013-04-06 DIAGNOSIS — Z794 Long term (current) use of insulin: Secondary | ICD-10-CM | POA: Diagnosis not present

## 2013-04-06 DIAGNOSIS — E7800 Pure hypercholesterolemia, unspecified: Secondary | ICD-10-CM | POA: Diagnosis not present

## 2013-04-06 DIAGNOSIS — R4182 Altered mental status, unspecified: Secondary | ICD-10-CM | POA: Diagnosis not present

## 2013-04-06 DIAGNOSIS — I2510 Atherosclerotic heart disease of native coronary artery without angina pectoris: Secondary | ICD-10-CM | POA: Diagnosis not present

## 2013-04-06 DIAGNOSIS — I10 Essential (primary) hypertension: Secondary | ICD-10-CM | POA: Diagnosis not present

## 2013-04-06 DIAGNOSIS — R5381 Other malaise: Secondary | ICD-10-CM | POA: Diagnosis not present

## 2013-04-12 DIAGNOSIS — G589 Mononeuropathy, unspecified: Secondary | ICD-10-CM | POA: Diagnosis not present

## 2013-04-17 DIAGNOSIS — M25559 Pain in unspecified hip: Secondary | ICD-10-CM | POA: Diagnosis not present

## 2013-04-24 DIAGNOSIS — M25559 Pain in unspecified hip: Secondary | ICD-10-CM | POA: Diagnosis not present

## 2013-04-27 DIAGNOSIS — E119 Type 2 diabetes mellitus without complications: Secondary | ICD-10-CM | POA: Diagnosis not present

## 2013-05-08 DIAGNOSIS — IMO0002 Reserved for concepts with insufficient information to code with codable children: Secondary | ICD-10-CM | POA: Diagnosis not present

## 2013-05-10 DIAGNOSIS — M545 Low back pain, unspecified: Secondary | ICD-10-CM | POA: Diagnosis not present

## 2013-05-10 DIAGNOSIS — M5126 Other intervertebral disc displacement, lumbar region: Secondary | ICD-10-CM | POA: Diagnosis not present

## 2013-05-12 DIAGNOSIS — R599 Enlarged lymph nodes, unspecified: Secondary | ICD-10-CM | POA: Diagnosis not present

## 2013-05-12 DIAGNOSIS — M5126 Other intervertebral disc displacement, lumbar region: Secondary | ICD-10-CM | POA: Diagnosis not present

## 2013-05-17 DIAGNOSIS — K461 Unspecified abdominal hernia with gangrene: Secondary | ICD-10-CM | POA: Diagnosis not present

## 2013-05-17 DIAGNOSIS — R599 Enlarged lymph nodes, unspecified: Secondary | ICD-10-CM | POA: Diagnosis not present

## 2013-05-24 DIAGNOSIS — IMO0001 Reserved for inherently not codable concepts without codable children: Secondary | ICD-10-CM | POA: Diagnosis not present

## 2013-05-25 DIAGNOSIS — I519 Heart disease, unspecified: Secondary | ICD-10-CM | POA: Diagnosis not present

## 2013-05-25 DIAGNOSIS — C679 Malignant neoplasm of bladder, unspecified: Secondary | ICD-10-CM | POA: Diagnosis not present

## 2013-05-25 DIAGNOSIS — N390 Urinary tract infection, site not specified: Secondary | ICD-10-CM | POA: Diagnosis not present

## 2013-05-25 DIAGNOSIS — E119 Type 2 diabetes mellitus without complications: Secondary | ICD-10-CM | POA: Diagnosis not present

## 2013-05-25 DIAGNOSIS — N400 Enlarged prostate without lower urinary tract symptoms: Secondary | ICD-10-CM | POA: Diagnosis not present

## 2013-05-25 DIAGNOSIS — R351 Nocturia: Secondary | ICD-10-CM | POA: Diagnosis not present

## 2013-07-13 DIAGNOSIS — G603 Idiopathic progressive neuropathy: Secondary | ICD-10-CM | POA: Diagnosis not present

## 2013-07-13 DIAGNOSIS — R4701 Aphasia: Secondary | ICD-10-CM | POA: Diagnosis not present

## 2013-07-13 DIAGNOSIS — IMO0002 Reserved for concepts with insufficient information to code with codable children: Secondary | ICD-10-CM | POA: Diagnosis not present

## 2013-07-13 DIAGNOSIS — I6330 Cerebral infarction due to thrombosis of unspecified cerebral artery: Secondary | ICD-10-CM | POA: Diagnosis not present

## 2013-07-18 DIAGNOSIS — M545 Low back pain, unspecified: Secondary | ICD-10-CM | POA: Diagnosis not present

## 2013-07-25 DIAGNOSIS — F29 Unspecified psychosis not due to a substance or known physiological condition: Secondary | ICD-10-CM | POA: Diagnosis not present

## 2013-07-25 DIAGNOSIS — G459 Transient cerebral ischemic attack, unspecified: Secondary | ICD-10-CM | POA: Diagnosis not present

## 2013-07-25 DIAGNOSIS — M545 Low back pain, unspecified: Secondary | ICD-10-CM | POA: Diagnosis not present

## 2013-07-25 DIAGNOSIS — E119 Type 2 diabetes mellitus without complications: Secondary | ICD-10-CM | POA: Diagnosis not present

## 2013-07-25 DIAGNOSIS — J42 Unspecified chronic bronchitis: Secondary | ICD-10-CM | POA: Diagnosis not present

## 2013-07-25 DIAGNOSIS — R4789 Other speech disturbances: Secondary | ICD-10-CM | POA: Diagnosis not present

## 2013-07-25 DIAGNOSIS — I6350 Cerebral infarction due to unspecified occlusion or stenosis of unspecified cerebral artery: Secondary | ICD-10-CM | POA: Diagnosis not present

## 2013-07-27 DIAGNOSIS — M545 Low back pain, unspecified: Secondary | ICD-10-CM | POA: Diagnosis not present

## 2013-08-01 DIAGNOSIS — M545 Low back pain, unspecified: Secondary | ICD-10-CM | POA: Diagnosis not present

## 2013-08-04 DIAGNOSIS — M545 Low back pain, unspecified: Secondary | ICD-10-CM | POA: Diagnosis not present

## 2013-08-09 DIAGNOSIS — M545 Low back pain, unspecified: Secondary | ICD-10-CM | POA: Diagnosis not present

## 2013-08-11 DIAGNOSIS — M545 Low back pain, unspecified: Secondary | ICD-10-CM | POA: Diagnosis not present

## 2013-08-15 DIAGNOSIS — M545 Low back pain, unspecified: Secondary | ICD-10-CM | POA: Diagnosis not present

## 2013-08-22 DIAGNOSIS — G603 Idiopathic progressive neuropathy: Secondary | ICD-10-CM | POA: Diagnosis not present

## 2013-08-22 DIAGNOSIS — M545 Low back pain, unspecified: Secondary | ICD-10-CM | POA: Diagnosis not present

## 2013-08-22 DIAGNOSIS — I6330 Cerebral infarction due to thrombosis of unspecified cerebral artery: Secondary | ICD-10-CM | POA: Diagnosis not present

## 2013-08-22 DIAGNOSIS — R4701 Aphasia: Secondary | ICD-10-CM | POA: Diagnosis not present

## 2013-08-24 DIAGNOSIS — IMO0001 Reserved for inherently not codable concepts without codable children: Secondary | ICD-10-CM | POA: Diagnosis not present

## 2013-08-25 DIAGNOSIS — M545 Low back pain, unspecified: Secondary | ICD-10-CM | POA: Diagnosis not present

## 2013-08-30 DIAGNOSIS — M545 Low back pain, unspecified: Secondary | ICD-10-CM | POA: Diagnosis not present

## 2013-08-31 DIAGNOSIS — IMO0001 Reserved for inherently not codable concepts without codable children: Secondary | ICD-10-CM | POA: Diagnosis not present

## 2013-08-31 DIAGNOSIS — M545 Low back pain, unspecified: Secondary | ICD-10-CM | POA: Diagnosis not present

## 2013-10-19 DIAGNOSIS — Z23 Encounter for immunization: Secondary | ICD-10-CM | POA: Diagnosis not present

## 2013-11-21 DIAGNOSIS — I6350 Cerebral infarction due to unspecified occlusion or stenosis of unspecified cerebral artery: Secondary | ICD-10-CM | POA: Diagnosis not present

## 2013-11-21 DIAGNOSIS — G603 Idiopathic progressive neuropathy: Secondary | ICD-10-CM | POA: Diagnosis not present

## 2013-11-21 DIAGNOSIS — R4701 Aphasia: Secondary | ICD-10-CM | POA: Diagnosis not present

## 2013-11-21 DIAGNOSIS — G939 Disorder of brain, unspecified: Secondary | ICD-10-CM | POA: Diagnosis not present

## 2013-12-12 DIAGNOSIS — C679 Malignant neoplasm of bladder, unspecified: Secondary | ICD-10-CM | POA: Diagnosis not present

## 2013-12-12 DIAGNOSIS — N3091 Cystitis, unspecified with hematuria: Secondary | ICD-10-CM | POA: Diagnosis not present

## 2013-12-12 DIAGNOSIS — R351 Nocturia: Secondary | ICD-10-CM | POA: Diagnosis not present

## 2013-12-12 DIAGNOSIS — R312 Other microscopic hematuria: Secondary | ICD-10-CM | POA: Diagnosis not present

## 2013-12-12 DIAGNOSIS — N400 Enlarged prostate without lower urinary tract symptoms: Secondary | ICD-10-CM | POA: Diagnosis not present

## 2013-12-13 NOTE — Progress Notes (Signed)
Background: The patient is followed for CAD. He has undergone multiple PCI procedures, with stenting of the RCA, anomalous circumflex, and ramus intermedius. PCI procedures were performed in 2005-2007. The patient's most recent cardiac cath in 2013 showed mild-moderate CAD and medical therapy was recommended. His LVEF is mildly depressed at 50% with inferior hypokinesis.   HPI:  68 year-old male presenting for follow-up evaluation. He has been previously followed by Dr Lia Foyer, last seen in 2014, and this is his first appointment with me.   The patient notes progressive symptoms of chest and left arm tightness with physical exertion. 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 He was restarted on plavix by his neurologist because of recurrent TIA's. The patient states his chest pressure resolves with rest. He has not taken nitroglycerin. He also complains of worsening generalized fatigue. He has mild DOE but no significant change. Chest pressure is worse over the past few months. He is now having symptoms with walking to his mailbox which is less than 1 city block walk.  Studies:  Cardiac Cath 10/02/2011: Procedural Findings: Hemodynamics:  AO 155/77 (105) LV 159/19 No gradient  Coronary angiography: Coronary dominance: right  Left mainstem: Calcified without significant focal narrowing.   Left anterior descending (LAD): Courses to the apex. It is smaller in caliber and has moderate calcification. After the septal there is 40-50% narrowing, and then 50-60% at the takeoff of the diagonal branch which is small. The distal LAD does not wrap the apex.   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 Both have been stented, the ramus in 2005 and the anomalous branch in 2007. The ramus bifurcates to a small branch and large  branch. The small branch is without narrowing. The large branch has a stent with about 40% narrowing in the distal aspect of the stent. It does not appear significantly compromised. It divides distally to two smaller branches without significant narrowing. The anomalous stent is also patent with 30-40% distal narrowing in the stent. It provides two marginal branches that have minor irregularity  Right coronary artery (RCA): It has been extensively stented with three prior stents. The proximal stent is widely patent. The mid stent is also widely patent. Prior to the third stent, there is 30-40% smooth narrowing in the native vessel and distally in the second stent at the acute margin. After this, there is another stent that has about 50% in stent narrowing. The vessel then provides a PDA with mild luminal irregularity. The AV branch has about 60% narrowing just after the PDA, and the AV branch has a 70-75 % area of narrowing at a bend leading in to the larger PLA artery. This is similar to slightly progressed from the prior study.   Left ventriculography: Left ventricular systolic function is mildly reduced, LVEF is estimated at 50%, there is no significant mitral regurgitation. There is inferior hypokinesis..  Final Conclusions:  1. Continue patency of the ramus and anomalous CFX stents 2. Continue patency of the proximal and mid RCA stents. 3. Mild in stent renarrowing of the distal RCA stent with moderate disease involving the AV branch after the PDA as noted. 4. Preserved overall LV function.   Recommendations:  1. Continued medical therapy 2. Iron studies with low MCV\ 3. Remain off of clopidogrel as he has not taken for a year.  4. Early  office followup with poss PCI for progressive symptoms.    Outpatient Encounter Prescriptions as of 12/14/2013  Medication Sig  . aspirin 81 MG tablet Take 81 mg by mouth daily.  [name] atorvastatin (LIPITOR) 20 MG tablet Take 20 mg  by mouth daily.    . carvedilol (COREG) 25 MG tablet Take 12.5 mg by mouth 2 (two) times daily with a meal.    . ciprofloxacin (CIPRO) 500 MG tablet   . clopidogrel (PLAVIX) 75 MG tablet   . fexofenadine (ALLEGRA) 180 MG tablet Take 180 mg by mouth daily.  [name] gemfibrozil (LOPID) 600 MG tablet Take 600 mg by mouth 2 (two) times daily.   . insulin lispro (HUMALOG) 100 UNIT/ML injection Inject 45 Units into the skin at bedtime.   . isosorbide mononitrate (IMDUR) 30 MG 24 hr tablet Take 30 mg by mouth daily.    . metFORMIN (GLUCOPHAGE) 850 MG tablet Take 850 mg by mouth 3 (three) times daily.    . nitroGLYCERIN (NITROSTAT) 0.4 MG SL tablet Place 0.4 mg under the tongue every 5 (five) minutes as needed. May repeat for up to 3 doses.   [name] NOVOLOG FLEXPEN 100 UNIT/ML FlexPen   . oxazepam (SERAX) 10 MG capsule   . pantoprazole (PROTONIX) 40 MG tablet Take 40 mg by mouth daily.    . paroxetine mesylate (PEXEVA) 40 MG tablet Take 20 mg by mouth every morning.    . VESICARE 5 MG tablet   . [DISCONTINUED] ezetimibe (ZETIA) 10 MG tablet Take 10 mg by mouth daily.    . [DISCONTINUED] glimepiride (AMARYL) 4 MG tablet Take 6 mg by mouth daily before breakfast.  . [DISCONTINUED] PARoxetine (PAXIL) 30 MG tablet   . [DISCONTINUED] PARoxetine (PAXIL) 40 MG tablet   . [DISCONTINUED] zolpidem (AMBIEN) 10 MG tablet Take 10 mg by mouth at bedtime as needed. For sleep    Allergies  Allergen Reactions  . Nitrofurantoin Monohyd Macro Hives and Rash    Past Medical History  Diagnosis Date  . Hypertension   . Hyperlipidemia   . Diabetes mellitus type II   . GERD (gastroesophageal reflux disease)   . Obesity   . Bladder tumor     Recurrent  . MI (myocardial infarction) 1994    Inferior, PCI and Streptokinase  . Tobacco user     Quit 1994  . Wide-complex tachycardia     family history includes Coronary artery disease in his other.   ROS: Positive for epistaxis, fatigue, left-sides weakness, ambulates  with a cane, otherwise negative except as per HPI  BP 142/68 mmHg  Pulse 51  Ht 5\' 6"  (1.676 m)  Wt 186 lb 1.9 oz (84.423 kg)  BMI 30.05 kg/m2  PHYSICAL EXAM: Pt is alert and oriented, NAD HEENT: normal Neck: JVP - normal, carotids 2+= without bruits Lungs: CTA bilaterally CV: RRR without murmur or gallop Abd: soft, NT, Positive BS, no hepatomegaly Ext: no C/C/E, distal pulses intact and equal Skin: warm/dry no rash  EKG:  Sinus bradycardia 51 bpm, possible inferior infarct age undetermined, ST and T-wave abnormality consider anterolateral ischemia.  ASSESSMENT AND PLAN: 1. CAD, native vessel: now with progressive symptoms of angina. Pt has progressive, CCS Class 3 angina on maximal medical therapy. He has known extensive CAD. I have recommended cardiac cath and possible PCI for further evaluation and treatment.   I have reviewed the risks, indications, and alternatives to cardiac catheterization and possible PCI were reviewed with the patient. Risks include but are not limited  to bleeding, infection, vascular injury, stroke, myocardial infection, arrhythmia, kidney injury, radiation-related injury in the case of prolonged fluoroscopy use, emergency cardiac surgery, and death. The patient understands the risks of serious complication is low (<0%).   2. Essential HTN: continue current medical therapy  3. Hyperlipidemia: continues on atorvastatin - followed by PCP Lipid Panel  No results found for: CHOL, TRIG, HDL, CHOLHDL, VLDL, LDLCALC, LDLDIRECT   Sherren Mocha, MD 12/14/2013 10:02 AM

## 2013-12-14 ENCOUNTER — Encounter: Payer: Self-pay | Admitting: Cardiovascular Disease

## 2013-12-14 ENCOUNTER — Ambulatory Visit (INDEPENDENT_AMBULATORY_CARE_PROVIDER_SITE_OTHER): Payer: Medicare Other | Admitting: Cardiovascular Disease

## 2013-12-14 VITALS — BP 142/68 | HR 51 | Ht 66.0 in | Wt 186.1 lb

## 2013-12-14 DIAGNOSIS — I10 Essential (primary) hypertension: Secondary | ICD-10-CM

## 2013-12-14 DIAGNOSIS — E785 Hyperlipidemia, unspecified: Secondary | ICD-10-CM | POA: Diagnosis not present

## 2013-12-14 DIAGNOSIS — I25118 Atherosclerotic heart disease of native coronary artery with other forms of angina pectoris: Secondary | ICD-10-CM | POA: Diagnosis not present

## 2013-12-14 LAB — BASIC METABOLIC PANEL
BUN: 19 mg/dL (ref 6–23)
CALCIUM: 8.5 mg/dL (ref 8.4–10.5)
CO2: 22 mEq/L (ref 19–32)
CREATININE: 1.1 mg/dL (ref 0.4–1.5)
Chloride: 103 mEq/L (ref 96–112)
GFR: 72.73 mL/min (ref >60.00)
Glucose, Bld: 187 mg/dL — ABNORMAL HIGH (ref 70–99)
Potassium: 3.5 mEq/L (ref 3.5–5.1)
Sodium: 135 mEq/L (ref 135–145)

## 2013-12-14 LAB — CBC
HEMATOCRIT: 39.1 % (ref 39.0–52.0)
Hemoglobin: 13.3 g/dL (ref 13.0–17.0)
MCHC: 33.9 g/dL (ref 30.0–36.0)
MCV: 80.3 fl (ref 78.0–100.0)
Platelets: 171 10*3/uL (ref 150.0–400.0)
RBC: 4.87 Mil/uL (ref 4.22–5.81)
RDW: 14.2 % (ref 11.5–15.5)
WBC: 8.8 10*3/uL (ref 4.0–10.5)

## 2013-12-14 LAB — PROTIME-INR
INR: 1 ratio (ref 0.8–1.0)
PROTHROMBIN TIME: 10.8 s (ref 9.6–13.1)

## 2013-12-14 MED ORDER — NITROGLYCERIN 0.4 MG SL SUBL: 0.4000 mg | SUBLINGUAL_TABLET | SUBLINGUAL | Status: DC | PRN

## 2013-12-14 NOTE — Patient Instructions (Signed)
Your physician has requested that you have a cardiac catheterization. Cardiac catheterization is used to diagnose and/or treat various heart conditions. Doctors may recommend this procedure for a number of different reasons. The most common reason is to evaluate chest pain. Chest pain can be a symptom of coronary artery disease (CAD), and cardiac catheterization can show whether plaque is narrowing or blocking your heart's arteries. This procedure is also used to evaluate the valves, as well as measure the blood flow and oxygen levels in different parts of your heart. For further information please visit [email or web address]. Please follow instruction sheet, as given.  Your physician recommends that you have lab work today: BMP, CBC and PT/INR  Your physician recommends that you continue on your current medications as directed. Please refer to the Current Medication list given to you today.   

## 2013-12-21 ENCOUNTER — Encounter (HOSPITAL_COMMUNITY): Payer: Self-pay | Admitting: Cardiology

## 2013-12-26 DIAGNOSIS — E1165 Type 2 diabetes mellitus with hyperglycemia: Secondary | ICD-10-CM | POA: Diagnosis not present

## 2013-12-27 ENCOUNTER — Encounter (HOSPITAL_COMMUNITY)
Admission: RE | Disposition: A | Discharge status: Home / Self Care | Payer: Self-pay | Source: Ambulatory Visit | Attending: Cardiovascular Disease

## 2013-12-27 ENCOUNTER — Ambulatory Visit (HOSPITAL_BASED_OUTPATIENT_CLINIC_OR_DEPARTMENT_OTHER)
Admission: RE | Admit: 2013-12-27 | Discharge: 2013-12-28 | Disposition: A | Discharge status: Home / Self Care | Payer: Medicare Other | Source: Ambulatory Visit | Attending: Cardiovascular Disease | Admitting: Cardiovascular Disease

## 2013-12-27 ENCOUNTER — Encounter (HOSPITAL_COMMUNITY): Payer: Self-pay | Admitting: Cardiovascular Disease

## 2013-12-27 DIAGNOSIS — R7989 Other specified abnormal findings of blood chemistry: Secondary | ICD-10-CM | POA: Diagnosis not present

## 2013-12-27 DIAGNOSIS — J42 Unspecified chronic bronchitis: Secondary | ICD-10-CM | POA: Diagnosis not present

## 2013-12-27 DIAGNOSIS — E119 Type 2 diabetes mellitus without complications: Secondary | ICD-10-CM | POA: Insufficient documentation

## 2013-12-27 DIAGNOSIS — Z7982 Long term (current) use of aspirin: Secondary | ICD-10-CM | POA: Insufficient documentation

## 2013-12-27 DIAGNOSIS — E669 Obesity, unspecified: Secondary | ICD-10-CM | POA: Insufficient documentation

## 2013-12-27 DIAGNOSIS — Z8249 Family history of ischemic heart disease and other diseases of the circulatory system: Secondary | ICD-10-CM

## 2013-12-27 DIAGNOSIS — I69354 Hemiplegia and hemiparesis following cerebral infarction affecting left non-dominant side: Secondary | ICD-10-CM

## 2013-12-27 DIAGNOSIS — R748 Abnormal levels of other serum enzymes: Secondary | ICD-10-CM | POA: Diagnosis present

## 2013-12-27 DIAGNOSIS — Y838 Other surgical procedures as the cause of abnormal reaction of the patient, or of later complication, without mention of misadventure at the time of the procedure: Secondary | ICD-10-CM | POA: Insufficient documentation

## 2013-12-27 DIAGNOSIS — Z955 Presence of coronary angioplasty implant and graft: Secondary | ICD-10-CM

## 2013-12-27 DIAGNOSIS — I472 Ventricular tachycardia: Secondary | ICD-10-CM

## 2013-12-27 DIAGNOSIS — T82858A Stenosis of vascular prosthetic devices, implants and grafts, initial encounter: Secondary | ICD-10-CM | POA: Diagnosis present

## 2013-12-27 DIAGNOSIS — I10 Essential (primary) hypertension: Secondary | ICD-10-CM | POA: Diagnosis present

## 2013-12-27 DIAGNOSIS — Z7902 Long term (current) use of antithrombotics/antiplatelets: Secondary | ICD-10-CM | POA: Insufficient documentation

## 2013-12-27 DIAGNOSIS — G9340 Encephalopathy, unspecified: Secondary | ICD-10-CM | POA: Diagnosis present

## 2013-12-27 DIAGNOSIS — M545 Low back pain: Secondary | ICD-10-CM | POA: Diagnosis present

## 2013-12-27 DIAGNOSIS — K219 Gastro-esophageal reflux disease without esophagitis: Secondary | ICD-10-CM | POA: Diagnosis present

## 2013-12-27 DIAGNOSIS — I517 Cardiomegaly: Secondary | ICD-10-CM | POA: Diagnosis not present

## 2013-12-27 DIAGNOSIS — G939 Disorder of brain, unspecified: Secondary | ICD-10-CM | POA: Diagnosis present

## 2013-12-27 DIAGNOSIS — I25119 Atherosclerotic heart disease of native coronary artery with unspecified angina pectoris: Secondary | ICD-10-CM

## 2013-12-27 DIAGNOSIS — I252 Old myocardial infarction: Secondary | ICD-10-CM | POA: Insufficient documentation

## 2013-12-27 DIAGNOSIS — Z7901 Long term (current) use of anticoagulants: Secondary | ICD-10-CM | POA: Diagnosis not present

## 2013-12-27 DIAGNOSIS — Z87891 Personal history of nicotine dependence: Secondary | ICD-10-CM

## 2013-12-27 DIAGNOSIS — R4182 Altered mental status, unspecified: Secondary | ICD-10-CM | POA: Diagnosis not present

## 2013-12-27 DIAGNOSIS — D494 Neoplasm of unspecified behavior of bladder: Secondary | ICD-10-CM | POA: Diagnosis present

## 2013-12-27 DIAGNOSIS — I6789 Other cerebrovascular disease: Secondary | ICD-10-CM | POA: Diagnosis not present

## 2013-12-27 DIAGNOSIS — Z8673 Personal history of transient ischemic attack (TIA), and cerebral infarction without residual deficits: Secondary | ICD-10-CM

## 2013-12-27 DIAGNOSIS — I5040 Unspecified combined systolic (congestive) and diastolic (congestive) heart failure: Secondary | ICD-10-CM | POA: Diagnosis present

## 2013-12-27 DIAGNOSIS — Z794 Long term (current) use of insulin: Secondary | ICD-10-CM | POA: Insufficient documentation

## 2013-12-27 DIAGNOSIS — I25110 Atherosclerotic heart disease of native coronary artery with unstable angina pectoris: Secondary | ICD-10-CM | POA: Diagnosis not present

## 2013-12-27 DIAGNOSIS — E780 Pure hypercholesterolemia: Secondary | ICD-10-CM | POA: Diagnosis not present

## 2013-12-27 DIAGNOSIS — Z79899 Other long term (current) drug therapy: Secondary | ICD-10-CM | POA: Insufficient documentation

## 2013-12-27 DIAGNOSIS — E785 Hyperlipidemia, unspecified: Secondary | ICD-10-CM | POA: Diagnosis present

## 2013-12-27 DIAGNOSIS — E1149 Type 2 diabetes mellitus with other diabetic neurological complication: Secondary | ICD-10-CM | POA: Diagnosis not present

## 2013-12-27 DIAGNOSIS — R51 Headache: Secondary | ICD-10-CM | POA: Diagnosis not present

## 2013-12-27 DIAGNOSIS — R410 Disorientation, unspecified: Secondary | ICD-10-CM | POA: Diagnosis not present

## 2013-12-27 DIAGNOSIS — E118 Type 2 diabetes mellitus with unspecified complications: Secondary | ICD-10-CM | POA: Diagnosis not present

## 2013-12-27 DIAGNOSIS — G8929 Other chronic pain: Secondary | ICD-10-CM | POA: Diagnosis present

## 2013-12-27 DIAGNOSIS — I2510 Atherosclerotic heart disease of native coronary artery without angina pectoris: Secondary | ICD-10-CM | POA: Diagnosis not present

## 2013-12-27 DIAGNOSIS — Z6830 Body mass index (BMI) 30.0-30.9, adult: Secondary | ICD-10-CM | POA: Insufficient documentation

## 2013-12-27 DIAGNOSIS — I6330 Cerebral infarction due to thrombosis of unspecified cerebral artery: Secondary | ICD-10-CM | POA: Diagnosis present

## 2013-12-27 DIAGNOSIS — I6782 Cerebral ischemia: Secondary | ICD-10-CM | POA: Diagnosis present

## 2013-12-27 DIAGNOSIS — I639 Cerebral infarction, unspecified: Secondary | ICD-10-CM | POA: Diagnosis present

## 2013-12-27 DIAGNOSIS — R4781 Slurred speech: Secondary | ICD-10-CM | POA: Diagnosis not present

## 2013-12-27 DIAGNOSIS — F4489 Other dissociative and conversion disorders: Secondary | ICD-10-CM | POA: Diagnosis not present

## 2013-12-27 DIAGNOSIS — I209 Angina pectoris, unspecified: Secondary | ICD-10-CM | POA: Diagnosis present

## 2013-12-27 DIAGNOSIS — I6523 Occlusion and stenosis of bilateral carotid arteries: Secondary | ICD-10-CM | POA: Diagnosis not present

## 2013-12-27 HISTORY — DX: Calculus of kidney: N20.0

## 2013-12-27 HISTORY — DX: Atherosclerotic heart disease of native coronary artery without angina pectoris: I25.10

## 2013-12-27 HISTORY — DX: Malignant (primary) neoplasm, unspecified: C80.1

## 2013-12-27 HISTORY — DX: Cerebral infarction, unspecified: I63.9

## 2013-12-27 HISTORY — PX: LEFT HEART CATHETERIZATION WITH CORONARY ANGIOGRAM: SHX5451

## 2013-12-27 HISTORY — DX: Low back pain: M54.5

## 2013-12-27 HISTORY — DX: Transient cerebral ischemic attack, unspecified: G45.9

## 2013-12-27 HISTORY — DX: Other chronic pain: G89.29

## 2013-12-27 HISTORY — DX: ST elevation (STEMI) myocardial infarction involving other coronary artery of inferior wall: I21.19

## 2013-12-27 HISTORY — DX: Personal history of other diseases of the digestive system: Z87.19

## 2013-12-27 HISTORY — PX: CORONARY ANGIOPLASTY WITH STENT PLACEMENT: SHX49

## 2013-12-27 LAB — POCT ACTIVATED CLOTTING TIME
ACTIVATED CLOTTING TIME: 214 s
Activated Clotting Time: 269 seconds

## 2013-12-27 LAB — GLUCOSE, CAPILLARY
Glucose-Capillary: 171 mg/dL — ABNORMAL HIGH (ref 70–99)
Glucose-Capillary: 167 mg/dL — ABNORMAL HIGH (ref 70–99)

## 2013-12-27 SURGERY — LEFT HEART CATHETERIZATION WITH CORONARY ANGIOGRAM
Anesthesia: LOCAL

## 2013-12-27 MED ORDER — SODIUM CHLORIDE 0.9 % IJ SOLN: 3.0000 mL | Freq: Two times a day (BID) | INTRAMUSCULAR | Status: DC

## 2013-12-27 MED ORDER — ASPIRIN 81 MG PO CHEW
81.0000 mg | CHEWABLE_TABLET | Freq: Every day | ORAL | Status: DC
  Filled 2013-12-27: qty 1

## 2013-12-27 MED ORDER — SODIUM CHLORIDE 0.9 % IV SOLN: 250.0000 mL | INTRAVENOUS | Status: DC | PRN

## 2013-12-27 MED ORDER — INSULIN ASPART 100 UNIT/ML ~~LOC~~ SOLN: 0.0000 [IU] | Freq: Three times a day (TID) | SUBCUTANEOUS | Status: DC

## 2013-12-27 MED ORDER — PAROXETINE MESYLATE 40 MG PO TABS
20.0000 mg | ORAL_TABLET | ORAL | Status: DC
  Filled 2013-12-27 (×3): qty 1

## 2013-12-27 MED ORDER — LORATADINE 10 MG PO TABS
10.0000 mg | ORAL_TABLET | Freq: Every day | ORAL | Status: DC
Start: 2013-12-27 — End: 2013-12-28
  Administered 2013-12-28: 11:00:00 10 mg via ORAL
  Filled 2013-12-27 (×2): qty 1

## 2013-12-27 MED ORDER — HEPARIN (PORCINE) IN NACL 2-0.9 UNIT/ML-% IJ SOLN
INTRAMUSCULAR | Status: AC
  Filled 2013-12-27: qty 1500

## 2013-12-27 MED ORDER — LIDOCAINE HCL (PF) 1 % IJ SOLN
INTRAMUSCULAR | Status: AC
  Filled 2013-12-27: qty 30

## 2013-12-27 MED ORDER — HYDRALAZINE HCL 20 MG/ML IJ SOLN
10.0000 mg | Freq: Once | INTRAMUSCULAR | Status: DC | PRN
  Filled 2013-12-27: qty 1

## 2013-12-27 MED ORDER — NITROGLYCERIN 1 MG/10 ML FOR IR/CATH LAB
INTRA_ARTERIAL | Status: AC
  Filled 2013-12-27: qty 10

## 2013-12-27 MED ORDER — ACETAMINOPHEN 325 MG PO TABS
650.0000 mg | ORAL_TABLET | ORAL | Status: DC | PRN
  Administered 2013-12-27 – 2013-12-28 (×2): 650 mg via ORAL
  Filled 2013-12-27 (×2): qty 2

## 2013-12-27 MED ORDER — PANTOPRAZOLE SODIUM 40 MG PO TBEC
40.0000 mg | DELAYED_RELEASE_TABLET | Freq: Every day | ORAL | Status: DC
  Administered 2013-12-28: 11:00:00 40 mg via ORAL
  Filled 2013-12-27: qty 1

## 2013-12-27 MED ORDER — CARVEDILOL 12.5 MG PO TABS
12.5000 mg | ORAL_TABLET | Freq: Two times a day (BID) | ORAL | Status: DC
  Administered 2013-12-27 – 2013-12-28 (×2): 12.5 mg via ORAL
  Filled 2013-12-27 (×4): qty 1

## 2013-12-27 MED ORDER — LORAZEPAM 1 MG PO TABS
1.0000 mg | ORAL_TABLET | Freq: Four times a day (QID) | ORAL | Status: DC | PRN
  Administered 2013-12-28: 1 mg via ORAL
  Filled 2013-12-27: qty 1

## 2013-12-27 MED ORDER — SODIUM CHLORIDE 0.9 % IV SOLN: 1.0000 mL/kg/h | INTRAVENOUS | Status: AC

## 2013-12-27 MED ORDER — SODIUM CHLORIDE 0.9 % IV SOLN
INTRAVENOUS | Status: DC
Start: 2013-12-27 — End: 2013-12-27
  Administered 2013-12-27: 08:00:00 via INTRAVENOUS

## 2013-12-27 MED ORDER — ASPIRIN 81 MG PO CHEW
81.0000 mg | CHEWABLE_TABLET | ORAL | Status: AC
  Administered 2013-12-27: 81 mg via ORAL

## 2013-12-27 MED ORDER — ATORVASTATIN CALCIUM 20 MG PO TABS
20.0000 mg | ORAL_TABLET | Freq: Every day | ORAL | Status: DC
  Administered 2013-12-27: 20 mg via ORAL
  Filled 2013-12-27 (×2): qty 1

## 2013-12-27 MED ORDER — ISOSORBIDE MONONITRATE ER 30 MG PO TB24
30.0000 mg | ORAL_TABLET | Freq: Every day | ORAL | Status: DC
  Administered 2013-12-28: 30 mg via ORAL
  Filled 2013-12-27 (×2): qty 1

## 2013-12-27 MED ORDER — HEPARIN SODIUM (PORCINE) 1000 UNIT/ML IJ SOLN
INTRAMUSCULAR | Status: AC
  Filled 2013-12-27: qty 1

## 2013-12-27 MED ORDER — HYDRALAZINE HCL 20 MG/ML IJ SOLN
10.0000 mg | Freq: Four times a day (QID) | INTRAMUSCULAR | Status: DC | PRN
  Administered 2013-12-27: 17:00:00 10 mg via INTRAVENOUS

## 2013-12-27 MED ORDER — ASPIRIN 81 MG PO CHEW
CHEWABLE_TABLET | ORAL | Status: AC
  Filled 2013-12-27: qty 1

## 2013-12-27 MED ORDER — ASPIRIN 81 MG PO TABS: 81.0000 mg | ORAL_TABLET | Freq: Every day | ORAL | Status: DC

## 2013-12-27 MED ORDER — SODIUM CHLORIDE 0.9 % IJ SOLN: 3.0000 mL | INTRAMUSCULAR | Status: DC | PRN

## 2013-12-27 MED ORDER — ONDANSETRON HCL 4 MG/2ML IJ SOLN: 4.0000 mg | Freq: Four times a day (QID) | INTRAMUSCULAR | Status: DC | PRN

## 2013-12-27 MED ORDER — CLOPIDOGREL BISULFATE 75 MG PO TABS
75.0000 mg | ORAL_TABLET | Freq: Every day | ORAL | Status: DC
  Administered 2013-12-28: 75 mg via ORAL
  Filled 2013-12-27: qty 1

## 2013-12-27 MED ORDER — LABETALOL HCL 5 MG/ML IV SOLN: 10.0000 mg | INTRAVENOUS | Status: DC | PRN

## 2013-12-27 MED ORDER — DARIFENACIN HYDROBROMIDE ER 7.5 MG PO TB24
7.5000 mg | ORAL_TABLET | Freq: Every day | ORAL | Status: DC
  Administered 2013-12-28: 7.5 mg via ORAL
  Filled 2013-12-27 (×2): qty 1

## 2013-12-27 MED ORDER — MIDAZOLAM HCL 2 MG/2ML IJ SOLN
INTRAMUSCULAR | Status: AC
  Filled 2013-12-27: qty 2

## 2013-12-27 MED ORDER — VERAPAMIL HCL 2.5 MG/ML IV SOLN
INTRAVENOUS | Status: AC
Start: 2013-12-27 — End: 2013-12-27
  Filled 2013-12-27: qty 2

## 2013-12-27 MED ORDER — GEMFIBROZIL 600 MG PO TABS
600.0000 mg | ORAL_TABLET | Freq: Two times a day (BID) | ORAL | Status: DC
  Administered 2013-12-27 – 2013-12-28 (×2): 600 mg via ORAL
  Filled 2013-12-27 (×4): qty 1

## 2013-12-27 MED ORDER — INSULIN DETEMIR 100 UNIT/ML ~~LOC~~ SOLN
65.0000 [IU] | Freq: Every day | SUBCUTANEOUS | Status: DC
  Administered 2013-12-27: 22:00:00 65 [IU] via SUBCUTANEOUS
  Filled 2013-12-27 (×2): qty 0.65

## 2013-12-27 MED ORDER — OXYCODONE-ACETAMINOPHEN 5-325 MG PO TABS: 1.0000 | ORAL_TABLET | ORAL | Status: DC | PRN

## 2013-12-27 MED ORDER — FENTANYL CITRATE 0.05 MG/ML IJ SOLN
INTRAMUSCULAR | Status: AC
  Filled 2013-12-27: qty 2

## 2013-12-27 MED ORDER — SODIUM CHLORIDE 0.9 % IJ SOLN
3.0000 mL | INTRAMUSCULAR | Status: DC | PRN
  Administered 2013-12-27: 3 mL via INTRAVENOUS
  Filled 2013-12-27: qty 3

## 2013-12-27 NOTE — CV Procedure (Signed)
Cardiac Catheterization Procedure Note  Name: Carl Stephenson MRN: [id number] DOB: 10/24/1945  Procedure: Left Heart Cath, Selective Coronary Angiography, LV angiography, PTCA and stenting of the proximal LAD  Indication: 68 year old gentleman with extensive CAD. He's had long-standing diabetes. He has undergone previous stenting of the right coronary artery, anomalous left circumflex, and diagonal branches. The patient presented recently with crescendo anginal symptoms and he was scheduled for outpatient cardiac catheterization. He has been maintained on long-term clopidogrel.  Procedural Details:  The right wrist was prepped, draped, and anesthetized with 1% lidocaine. Using the modified Seldinger technique, a 5/6 French Slender sheath was introduced into the right radial artery. 3 mg of verapamil was administered through the sheath, weight-based unfractionated heparin was administered intravenously. Standard Judkins catheters were used for selective coronary angiography and left ventriculography. Catheter exchanges were performed over an exchange length guidewire.  PROCEDURAL FINDINGS Hemodynamics: AO 136/61 LV 135/20   Coronary angiography: Coronary dominance: right  Left mainstem: Patent vessel with minor irregularity. Divides into the LAD and intermediate branch.  Left anterior descending (LAD): The LAD is severely diseased. There is 90% proximal stenosis at the site of a very large first septal perforating branch. There is segmental 80% stenosis just beyond this lesion. Further down in the LAD there is diffuse 50% stenosis extending through the mid vessel. The LAD reaches the left ventricular apex.  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 The vessel is stented in the stented segment is patent with mild in-stent restenosis. There is diffuse stenosis of the obtuse marginal branch with 50-60% narrowing throughout a small portion of this vessel. The  branches arising from the left coronary artery including intermediate branch which has a widely patent stent with no significant in-stent restenosis. The intermediate branch divides and 20 vessel.  Right coronary artery (RCA): The RCA is dominant. The vessel is stented with only mild in-stent restenosis. There is diffuse distal vessel disease present. The PDA and PLA branches are patent with 50% stenosis in the posterior AV segment branch and 50% stenosis in the PDA branch.  Left ventriculography: Left ventricular systolic function is moderately depressed. The LVEF is estimated between 35 and 40%. There is hypokinesis of the anterolateral wall. The inferior wall and the basal and mid portions is severely hypokinetic.  PCI Note:  Following the diagnostic procedure, the decision was made to proceed with PCI of the proximal LAD.  Weight-based heparin was given for anticoagulation. Once a therapeutic ACT was achieved, a 6 [place] XB LAD 3.5 cm guide catheter was inserted.  A cougar coronary guidewire was used to cross the lesion.  The lesion was predilated with a 2.5 x 20 mm balloon.  The lesion was then stented with a 3.0 x 28 mm Xience DES.  The stent was postdilated with a 3.25 mm noncompliant balloon to 14 atm distally and 18 atm proximally.  Following PCI, there was 0% residual stenosis and TIMI-3 flow. The large septal perforating branch remained patent with TIMI-3 flow. Following PCI, there was diffuse residual stenosis in the mid LAD that I felt will be best managed with medical therapy as it extends throughout the distal vessel to the apex. The patient tolerated the procedure well. There were no immediate procedural complications. A TR band was used for radial hemostasis. The patient was transferred to the post catheterization recovery area for further monitoring.  PCI Data: Vessel - LAD/Segment - prox Percent Stenosis (pre)  90 TIMI-flow 3 Stent 3.0x28 mm Xience  DES Percent Stenosis (post)  0 TIMI-flow (post) 3  Contrast: 125 cc Omnipaque  Radiation dose/Fluoro time: 8.4 minutes  Estimated Blood Loss: minimal  Final Conclusions:   1. Three-vessel coronary artery disease with severe stenosis of the proximal LAD, continued stent patency in the right coronary artery, anomalous circumflex, and ramus intermedius. 2. Moderate segmental LV systolic dysfunction   Recommendations:  Continued aggressive medical therapy. Lifelong dual antiplatelet therapy with aspirin and Plavix as tolerated. Would anticipate discharge home tomorrow.  Sherren Mocha MD, [facility] 12/27/2013, 10:11 AM

## 2013-12-27 NOTE — Progress Notes (Addendum)
Patient has used urinal x 1 for 200 urine output, attempted to use urinal a second time and was unable spilled in bed x1  and urinated into toilet x1. Unable to measure urine output. Dr. Burt Knack aware and orders given. 1850 Bladder scan results were over 250 ml and patient straight cathed at [facility] for 300 mls of clear yellow urine. Tolerated procedure well and verbalized relief after cathed. Blood pressure slightly better. Will give coreg and monitor blood pressure for prn meds as needed. Report to Maxwell Marion RN. Patient resting comfortably at this time.

## 2013-12-27 NOTE — Interval H&P Note (Signed)
History and Physical Interval Note:  12/27/2013 8:58 AM  Carl Stephenson  has presented today for surgery, with the diagnosis of cad, cp  The various methods of treatment have been discussed with the patient and family. After consideration of risks, benefits and other options for treatment, the patient has consented to  Procedure(s): LEFT HEART CATHETERIZATION WITH CORONARY ANGIOGRAM (N/A) as a surgical intervention .  The patient's history has been reviewed, patient examined, no change in status, stable for surgery.  I have reviewed the patient's chart and labs.  Questions were answered to the patient's satisfaction.    Cath Lab Visit (complete for each Cath Lab visit)  Clinical Evaluation Leading to the Procedure:   ACS: No.  Non-ACS:    Anginal Classification: CCS III  Anti-ischemic medical therapy: Maximal Therapy (2 or more classes of medications)  Non-Invasive Test Results: No non-invasive testing performed  Prior CABG: No previous CABG       Sherren Mocha

## 2013-12-27 NOTE — Progress Notes (Addendum)
Pt's BP consistently running 170s-180s ever since this morning post-cath. He took all home antihypertensives to include Coreg and Imdur. Will rx hydralazine now. Pt reports BPs run 120s at home. Will need to observe trend in hospital and consider addition of another oral antihypertensive prior to dc if running higher. Sinus brady precludes BB titration. Gayle Martinez PA-C

## 2013-12-27 NOTE — Progress Notes (Signed)
TR BAND REMOVAL  LOCATION:    right radial  DEFLATED PER PROTOCOL:    Yes.    TIME BAND OFF / DRESSING APPLIED:    1930   SITE UPON ARRIVAL:    Level 0  SITE AFTER BAND REMOVAL:    Level 0   CIRCULATION SENSATION AND MOVEMENT:    Within Normal Limits   Yes.    COMMENTS:   Rechecked site at 2000 with no change noted. Dressing dry and intact, no bleeding, hematoma  And CSMs wnls with +2 pulses radial and ulnar.

## 2013-12-27 NOTE — H&P (View-Only) (Signed)
Background: The patient is followed for CAD. He has undergone multiple PCI procedures, with stenting of the RCA, anomalous circumflex, and ramus intermedius. PCI procedures were performed in 2005-2007. The patient's most recent cardiac cath in 2013 showed mild-moderate CAD and medical therapy was recommended. His LVEF is mildly depressed at 50% with inferior hypokinesis.   HPI:  68 year-old male presenting for follow-up evaluation. He has been previously followed by Dr Lia Foyer, last seen in 2014, and this is his first appointment with me.   The patient notes progressive symptoms of chest and left arm tightness with physical exertion. 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 He was restarted on plavix by his neurologist because of recurrent TIA's. The patient states his chest pressure resolves with rest. He has not taken nitroglycerin. He also complains of worsening generalized fatigue. He has mild DOE but no significant change. Chest pressure is worse over the past few months. He is now having symptoms with walking to his mailbox which is less than 1 city block walk.  Studies:  Cardiac Cath 10/02/2011: Procedural Findings: Hemodynamics:  AO 155/77 (105) LV 159/19 No gradient  Coronary angiography: Coronary dominance: right  Left mainstem: Calcified without significant focal narrowing.   Left anterior descending (LAD): Courses to the apex. It is smaller in caliber and has moderate calcification. After the septal there is 40-50% narrowing, and then 50-60% at the takeoff of the diagonal branch which is small. The distal LAD does not wrap the apex.   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 Both have been stented, the ramus in 2005 and the anomalous branch in 2007. The ramus bifurcates to a small branch and large  branch. The small branch is without narrowing. The large branch has a stent with about 40% narrowing in the distal aspect of the stent. It does not appear significantly compromised. It divides distally to two smaller branches without significant narrowing. The anomalous stent is also patent with 30-40% distal narrowing in the stent. It provides two marginal branches that have minor irregularity  Right coronary artery (RCA): It has been extensively stented with three prior stents. The proximal stent is widely patent. The mid stent is also widely patent. Prior to the third stent, there is 30-40% smooth narrowing in the native vessel and distally in the second stent at the acute margin. After this, there is another stent that has about 50% in stent narrowing. The vessel then provides a PDA with mild luminal irregularity. The AV branch has about 60% narrowing just after the PDA, and the AV branch has a 70-75 % area of narrowing at a bend leading in to the larger PLA artery. This is similar to slightly progressed from the prior study.   Left ventriculography: Left ventricular systolic function is mildly reduced, LVEF is estimated at 50%, there is no significant mitral regurgitation. There is inferior hypokinesis..  Final Conclusions:  1. Continue patency of the ramus and anomalous CFX stents 2. Continue patency of the proximal and mid RCA stents. 3. Mild in stent renarrowing of the distal RCA stent with moderate disease involving the AV branch after the PDA as noted. 4. Preserved overall LV function.   Recommendations:  1. Continued medical therapy 2. Iron studies with low MCV\ 3. Remain off of clopidogrel as he has not taken for a year.  4. Early  office followup with poss PCI for progressive symptoms.    Outpatient Encounter Prescriptions as of 12/14/2013  Medication Sig  . aspirin 81 MG tablet Take 81 mg by mouth daily.  [name] atorvastatin (LIPITOR) 20 MG tablet Take 20 mg  by mouth daily.    . carvedilol (COREG) 25 MG tablet Take 12.5 mg by mouth 2 (two) times daily with a meal.    . ciprofloxacin (CIPRO) 500 MG tablet   . clopidogrel (PLAVIX) 75 MG tablet   . fexofenadine (ALLEGRA) 180 MG tablet Take 180 mg by mouth daily.  [name] gemfibrozil (LOPID) 600 MG tablet Take 600 mg by mouth 2 (two) times daily.   . insulin lispro (HUMALOG) 100 UNIT/ML injection Inject 45 Units into the skin at bedtime.   . isosorbide mononitrate (IMDUR) 30 MG 24 hr tablet Take 30 mg by mouth daily.    . metFORMIN (GLUCOPHAGE) 850 MG tablet Take 850 mg by mouth 3 (three) times daily.    . nitroGLYCERIN (NITROSTAT) 0.4 MG SL tablet Place 0.4 mg under the tongue every 5 (five) minutes as needed. May repeat for up to 3 doses.   [name] NOVOLOG FLEXPEN 100 UNIT/ML FlexPen   . oxazepam (SERAX) 10 MG capsule   . pantoprazole (PROTONIX) 40 MG tablet Take 40 mg by mouth daily.    . paroxetine mesylate (PEXEVA) 40 MG tablet Take 20 mg by mouth every morning.    . VESICARE 5 MG tablet   . [DISCONTINUED] ezetimibe (ZETIA) 10 MG tablet Take 10 mg by mouth daily.    . [DISCONTINUED] glimepiride (AMARYL) 4 MG tablet Take 6 mg by mouth daily before breakfast.  . [DISCONTINUED] PARoxetine (PAXIL) 30 MG tablet   . [DISCONTINUED] PARoxetine (PAXIL) 40 MG tablet   . [DISCONTINUED] zolpidem (AMBIEN) 10 MG tablet Take 10 mg by mouth at bedtime as needed. For sleep    Allergies  Allergen Reactions  . Nitrofurantoin Monohyd Macro Hives and Rash    Past Medical History  Diagnosis Date  . Hypertension   . Hyperlipidemia   . Diabetes mellitus type II   . GERD (gastroesophageal reflux disease)   . Obesity   . Bladder tumor     Recurrent  . MI (myocardial infarction) 1994    Inferior, PCI and Streptokinase  . Tobacco user     Quit 1994  . Wide-complex tachycardia     family history includes Coronary artery disease in his other.   ROS: Positive for epistaxis, fatigue, left-sides weakness, ambulates  with a cane, otherwise negative except as per HPI  BP 142/68 mmHg  Pulse 51  Ht 5\' 6"  (1.676 m)  Wt 186 lb 1.9 oz (84.423 kg)  BMI 30.05 kg/m2  PHYSICAL EXAM: Pt is alert and oriented, NAD HEENT: normal Neck: JVP - normal, carotids 2+= without bruits Lungs: CTA bilaterally CV: RRR without murmur or gallop Abd: soft, NT, Positive BS, no hepatomegaly Ext: no C/C/E, distal pulses intact and equal Skin: warm/dry no rash  EKG:  Sinus bradycardia 51 bpm, possible inferior infarct age undetermined, ST and T-wave abnormality consider anterolateral ischemia.  ASSESSMENT AND PLAN: 1. CAD, native vessel: now with progressive symptoms of angina. Pt has progressive, CCS Class 3 angina on maximal medical therapy. He has known extensive CAD. I have recommended cardiac cath and possible PCI for further evaluation and treatment.   I have reviewed the risks, indications, and alternatives to cardiac catheterization and possible PCI were reviewed with the patient. Risks include but are not limited  to bleeding, infection, vascular injury, stroke, myocardial infection, arrhythmia, kidney injury, radiation-related injury in the case of prolonged fluoroscopy use, emergency cardiac surgery, and death. The patient understands the risks of serious complication is low (<1%).   2. Essential HTN: continue current medical therapy  3. Hyperlipidemia: continues on atorvastatin - followed by PCP Lipid Panel  No results found for: CHOL, TRIG, HDL, CHOLHDL, VLDL, LDLCALC, LDLDIRECT   Sherren Mocha, MD 12/14/2013 10:02 AM

## 2013-12-28 ENCOUNTER — Inpatient Hospital Stay (HOSPITAL_COMMUNITY)
Admission: AD | Admit: 2013-12-28 | Discharge: 2013-12-31 | DRG: 981 | Disposition: A | Discharge status: Home Health | Payer: Medicare Other | Source: Other Acute Inpatient Hospital | Attending: Internal Medicine | Admitting: Internal Medicine

## 2013-12-28 ENCOUNTER — Encounter (HOSPITAL_COMMUNITY): Payer: Self-pay | Admitting: Physician Assistant

## 2013-12-28 ENCOUNTER — Telehealth: Payer: Self-pay | Admitting: Cardiovascular Disease

## 2013-12-28 DIAGNOSIS — R410 Disorientation, unspecified: Secondary | ICD-10-CM

## 2013-12-28 DIAGNOSIS — R7989 Other specified abnormal findings of blood chemistry: Secondary | ICD-10-CM | POA: Diagnosis present

## 2013-12-28 DIAGNOSIS — R778 Other specified abnormalities of plasma proteins: Secondary | ICD-10-CM | POA: Diagnosis present

## 2013-12-28 DIAGNOSIS — I6523 Occlusion and stenosis of bilateral carotid arteries: Secondary | ICD-10-CM | POA: Diagnosis not present

## 2013-12-28 DIAGNOSIS — M545 Low back pain: Secondary | ICD-10-CM | POA: Diagnosis present

## 2013-12-28 DIAGNOSIS — Z955 Presence of coronary angioplasty implant and graft: Secondary | ICD-10-CM | POA: Diagnosis not present

## 2013-12-28 DIAGNOSIS — I5040 Unspecified combined systolic (congestive) and diastolic (congestive) heart failure: Secondary | ICD-10-CM | POA: Diagnosis not present

## 2013-12-28 DIAGNOSIS — E785 Hyperlipidemia, unspecified: Secondary | ICD-10-CM

## 2013-12-28 DIAGNOSIS — E119 Type 2 diabetes mellitus without complications: Secondary | ICD-10-CM

## 2013-12-28 DIAGNOSIS — I2510 Atherosclerotic heart disease of native coronary artery without angina pectoris: Secondary | ICD-10-CM | POA: Diagnosis present

## 2013-12-28 DIAGNOSIS — J42 Unspecified chronic bronchitis: Secondary | ICD-10-CM | POA: Diagnosis not present

## 2013-12-28 DIAGNOSIS — Z8673 Personal history of transient ischemic attack (TIA), and cerebral infarction without residual deficits: Secondary | ICD-10-CM | POA: Diagnosis not present

## 2013-12-28 DIAGNOSIS — Z794 Long term (current) use of insulin: Secondary | ICD-10-CM | POA: Diagnosis not present

## 2013-12-28 DIAGNOSIS — E780 Pure hypercholesterolemia: Secondary | ICD-10-CM | POA: Diagnosis not present

## 2013-12-28 DIAGNOSIS — I639 Cerebral infarction, unspecified: Principal | ICD-10-CM

## 2013-12-28 DIAGNOSIS — I25110 Atherosclerotic heart disease of native coronary artery with unstable angina pectoris: Secondary | ICD-10-CM

## 2013-12-28 DIAGNOSIS — I6789 Other cerebrovascular disease: Secondary | ICD-10-CM | POA: Diagnosis not present

## 2013-12-28 DIAGNOSIS — G8929 Other chronic pain: Secondary | ICD-10-CM | POA: Diagnosis present

## 2013-12-28 DIAGNOSIS — E118 Type 2 diabetes mellitus with unspecified complications: Secondary | ICD-10-CM

## 2013-12-28 DIAGNOSIS — I517 Cardiomegaly: Secondary | ICD-10-CM | POA: Diagnosis not present

## 2013-12-28 DIAGNOSIS — K219 Gastro-esophageal reflux disease without esophagitis: Secondary | ICD-10-CM | POA: Diagnosis not present

## 2013-12-28 DIAGNOSIS — T82858A Stenosis of vascular prosthetic devices, implants and grafts, initial encounter: Secondary | ICD-10-CM | POA: Diagnosis present

## 2013-12-28 DIAGNOSIS — Z7901 Long term (current) use of anticoagulants: Secondary | ICD-10-CM | POA: Diagnosis not present

## 2013-12-28 DIAGNOSIS — R4182 Altered mental status, unspecified: Secondary | ICD-10-CM | POA: Diagnosis not present

## 2013-12-28 DIAGNOSIS — Y838 Other surgical procedures as the cause of abnormal reaction of the patient, or of later complication, without mention of misadventure at the time of the procedure: Secondary | ICD-10-CM | POA: Diagnosis present

## 2013-12-28 DIAGNOSIS — I472 Ventricular tachycardia: Secondary | ICD-10-CM | POA: Diagnosis not present

## 2013-12-28 DIAGNOSIS — G9340 Encephalopathy, unspecified: Secondary | ICD-10-CM

## 2013-12-28 DIAGNOSIS — D494 Neoplasm of unspecified behavior of bladder: Secondary | ICD-10-CM | POA: Diagnosis present

## 2013-12-28 DIAGNOSIS — I252 Old myocardial infarction: Secondary | ICD-10-CM | POA: Diagnosis not present

## 2013-12-28 DIAGNOSIS — R748 Abnormal levels of other serum enzymes: Secondary | ICD-10-CM | POA: Diagnosis present

## 2013-12-28 DIAGNOSIS — R4781 Slurred speech: Secondary | ICD-10-CM | POA: Diagnosis not present

## 2013-12-28 DIAGNOSIS — Z87891 Personal history of nicotine dependence: Secondary | ICD-10-CM | POA: Diagnosis not present

## 2013-12-28 DIAGNOSIS — R51 Headache: Secondary | ICD-10-CM | POA: Diagnosis not present

## 2013-12-28 DIAGNOSIS — I25119 Atherosclerotic heart disease of native coronary artery with unspecified angina pectoris: Secondary | ICD-10-CM

## 2013-12-28 DIAGNOSIS — Z8249 Family history of ischemic heart disease and other diseases of the circulatory system: Secondary | ICD-10-CM | POA: Diagnosis not present

## 2013-12-28 DIAGNOSIS — E1149 Type 2 diabetes mellitus with other diabetic neurological complication: Secondary | ICD-10-CM | POA: Diagnosis not present

## 2013-12-28 DIAGNOSIS — I6330 Cerebral infarction due to thrombosis of unspecified cerebral artery: Secondary | ICD-10-CM | POA: Diagnosis present

## 2013-12-28 DIAGNOSIS — I10 Essential (primary) hypertension: Secondary | ICD-10-CM | POA: Diagnosis present

## 2013-12-28 DIAGNOSIS — Z7982 Long term (current) use of aspirin: Secondary | ICD-10-CM | POA: Diagnosis not present

## 2013-12-28 DIAGNOSIS — F4489 Other dissociative and conversion disorders: Secondary | ICD-10-CM | POA: Diagnosis not present

## 2013-12-28 HISTORY — DX: Encephalopathy, unspecified: G93.40

## 2013-12-28 LAB — BASIC METABOLIC PANEL
Anion gap: 14 (ref 5–15)
BUN: 15 mg/dL (ref 6–23)
CALCIUM: 8.8 mg/dL (ref 8.4–10.5)
CO2: 20 mEq/L (ref 19–32)
CREATININE: 0.81 mg/dL (ref 0.50–1.35)
Chloride: 100 mEq/L (ref 96–112)
GFR calc Af Amer: >90 mL/min (ref >90)
GFR calc non Af Amer: 89 mL/min — ABNORMAL LOW (ref >90)
GLUCOSE: 145 mg/dL — AB (ref 70–99)
Potassium: 3.9 mEq/L (ref 3.7–5.3)
Sodium: 134 mEq/L — ABNORMAL LOW (ref 137–147)

## 2013-12-28 LAB — CBC
HEMATOCRIT: 38.3 % — AB (ref 39.0–52.0)
Hemoglobin: 13 g/dL (ref 13.0–17.0)
MCH: 26.7 pg (ref 26.0–34.0)
MCHC: 33.9 g/dL (ref 30.0–36.0)
MCV: 78.6 fL (ref 78.0–100.0)
Platelets: 164 10*3/uL (ref 150–400)
RBC: 4.87 MIL/uL (ref 4.22–5.81)
RDW: 13.7 % (ref 11.5–15.5)
WBC: 10.4 10*3/uL (ref 4.0–10.5)

## 2013-12-28 LAB — GLUCOSE, CAPILLARY
GLUCOSE-CAPILLARY: 122 mg/dL — AB (ref 70–99)
Glucose-Capillary: 165 mg/dL — ABNORMAL HIGH (ref 70–99)
Glucose-Capillary: 120 mg/dL — ABNORMAL HIGH (ref 70–99)

## 2013-12-28 LAB — MAGNESIUM: Magnesium: 1.6 mg/dL (ref 1.5–2.5)

## 2013-12-28 MED ORDER — INSULIN DETEMIR 100 UNIT/ML ~~LOC~~ SOLN
65.0000 [IU] | Freq: Every day | SUBCUTANEOUS | Status: DC
  Filled 2013-12-28: qty 0.65

## 2013-12-28 MED ORDER — LISINOPRIL 10 MG PO TABS: 10.0000 mg | ORAL_TABLET | Freq: Every day | ORAL | Status: DC

## 2013-12-28 MED ORDER — LISINOPRIL 10 MG PO TABS
10.0000 mg | ORAL_TABLET | Freq: Every day | ORAL | Status: DC
  Administered 2013-12-29 – 2013-12-30 (×2): 10 mg via ORAL
  Filled 2013-12-28 (×3): qty 1

## 2013-12-28 MED ORDER — NITROGLYCERIN 0.4 MG SL SUBL: 0.4000 mg | SUBLINGUAL_TABLET | SUBLINGUAL | Status: DC | PRN

## 2013-12-28 MED ORDER — ROSUVASTATIN CALCIUM 10 MG PO TABS: 10.0000 mg | ORAL_TABLET | Freq: Every day | ORAL | Status: DC

## 2013-12-28 MED ORDER — GEMFIBROZIL 600 MG PO TABS
600.0000 mg | ORAL_TABLET | Freq: Two times a day (BID) | ORAL | Status: DC
  Administered 2013-12-29 (×2): 600 mg via ORAL
  Filled 2013-12-28 (×3): qty 1

## 2013-12-28 MED ORDER — PANTOPRAZOLE SODIUM 40 MG PO TBEC
40.0000 mg | DELAYED_RELEASE_TABLET | Freq: Every day | ORAL | Status: DC
  Administered 2013-12-29 – 2013-12-31 (×3): 40 mg via ORAL
  Filled 2013-12-28 (×3): qty 1

## 2013-12-28 MED ORDER — CLOPIDOGREL BISULFATE 75 MG PO TABS: 75.0000 mg | ORAL_TABLET | Freq: Every day | ORAL | Status: DC

## 2013-12-28 MED ORDER — STROKE: EARLY STAGES OF RECOVERY BOOK
Freq: Once | Status: AC
  Administered 2013-12-29: 01:00:00
  Filled 2013-12-28: qty 1

## 2013-12-28 MED ORDER — INSULIN ASPART 100 UNIT/ML ~~LOC~~ SOLN
0.0000 [IU] | Freq: Three times a day (TID) | SUBCUTANEOUS | Status: DC
  Administered 2013-12-29: 3 [IU] via SUBCUTANEOUS
  Administered 2013-12-29: 5 [IU] via SUBCUTANEOUS
  Administered 2013-12-30 (×2): 3 [IU] via SUBCUTANEOUS
  Administered 2013-12-30: 5 [IU] via SUBCUTANEOUS
  Administered 2013-12-31: 3 [IU] via SUBCUTANEOUS
  Administered 2013-12-31: 5 [IU] via SUBCUTANEOUS

## 2013-12-28 MED ORDER — ROSUVASTATIN CALCIUM 10 MG PO TABS
10.0000 mg | ORAL_TABLET | Freq: Every day | ORAL | Status: DC
  Administered 2013-12-29 – 2013-12-30 (×2): 10 mg via ORAL
  Filled 2013-12-28 (×3): qty 1

## 2013-12-28 MED ORDER — INSULIN ASPART 100 UNIT/ML ~~LOC~~ SOLN
0.0000 [IU] | Freq: Every day | SUBCUTANEOUS | Status: DC
  Administered 2013-12-30: 2 [IU] via SUBCUTANEOUS

## 2013-12-28 MED ORDER — LISINOPRIL 10 MG PO TABS
10.0000 mg | ORAL_TABLET | Freq: Every day | ORAL | Status: DC
Start: 2013-12-28 — End: 2013-12-28
  Administered 2013-12-28: 11:00:00 10 mg via ORAL
  Filled 2013-12-28: qty 1

## 2013-12-28 MED ORDER — ASPIRIN 325 MG PO TABS
325.0000 mg | ORAL_TABLET | Freq: Every day | ORAL | Status: DC
Start: 2013-12-29 — End: 2013-12-31
  Administered 2013-12-29 – 2013-12-31 (×3): 325 mg via ORAL
  Filled 2013-12-28 (×3): qty 1

## 2013-12-28 MED ORDER — ROSUVASTATIN CALCIUM 10 MG PO TABS
10.0000 mg | ORAL_TABLET | Freq: Every day | ORAL | Status: DC
  Filled 2013-12-28: qty 1

## 2013-12-28 MED ORDER — CARVEDILOL 12.5 MG PO TABS
12.5000 mg | ORAL_TABLET | Freq: Two times a day (BID) | ORAL | Status: DC
  Administered 2013-12-29 – 2013-12-30 (×2): 12.5 mg via ORAL
  Filled 2013-12-28 (×6): qty 1

## 2013-12-28 MED ORDER — ENOXAPARIN SODIUM 40 MG/0.4ML ~~LOC~~ SOLN
40.0000 mg | SUBCUTANEOUS | Status: DC
  Administered 2013-12-29 – 2013-12-31 (×3): 40 mg via SUBCUTANEOUS
  Filled 2013-12-28 (×3): qty 0.4

## 2013-12-28 MED ORDER — ISOSORBIDE MONONITRATE ER 30 MG PO TB24
30.0000 mg | ORAL_TABLET | Freq: Every day | ORAL | Status: DC
  Administered 2013-12-29 – 2013-12-31 (×3): 30 mg via ORAL
  Filled 2013-12-28 (×3): qty 1

## 2013-12-28 MED ORDER — MAGNESIUM SULFATE 2 GM/50ML IV SOLN
2.0000 g | Freq: Once | INTRAVENOUS | Status: AC
  Administered 2013-12-28: 2 g via INTRAVENOUS
  Filled 2013-12-28: qty 50

## 2013-12-28 MED ORDER — CLOPIDOGREL BISULFATE 75 MG PO TABS
75.0000 mg | ORAL_TABLET | Freq: Every day | ORAL | Status: DC
  Administered 2013-12-29 – 2013-12-31 (×3): 75 mg via ORAL
  Filled 2013-12-28 (×3): qty 1

## 2013-12-28 NOTE — Progress Notes (Signed)
Patient Name: Carl Stephenson Date of Encounter: 12/28/2013  Dr. Burt Knack   Principal Problem:   Ischemic chest pain Active Problems:   Hyperlipidemia   Obesity   HYPERTENSION, BENIGN   CAD, NATIVE VESSEL   Transient cerebral ischemia   GERD   Diabetes mellitus   CVA (cerebral infarction)    SUBJECTIVE  Denies any CP or SOB. Feeling well  CURRENT MEDS . aspirin  81 mg Oral Daily  . atorvastatin  20 mg Oral QPC supper  . carvedilol  12.5 mg Oral BID WC  . clopidogrel  75 mg Oral Daily  . darifenacin  7.5 mg Oral Daily  . gemfibrozil  600 mg Oral BID  . insulin aspart  0-15 Units Subcutaneous TID WC  . insulin detemir  65 Units Subcutaneous QHS  . isosorbide mononitrate  30 mg Oral Daily  . loratadine  10 mg Oral Daily  . magnesium sulfate 1 - 4 g bolus IVPB  2 g Intravenous Once  . pantoprazole  40 mg Oral Daily  . paroxetine mesylate  20 mg Oral BH-[id number]  . sodium chloride  3 mL Intravenous Q12H    OBJECTIVE  Filed Vitals:   12/28/13 0200 12/28/13 0300 12/28/13 0400 12/28/13 0500  BP: 157/65 118/102 162/65 163/70  Pulse:    68  Temp:    98.4 F (36.9 C)  TempSrc:    Oral  Resp:    20  Height:      Weight:      SpO2:    97%    Intake/Output Summary (Last 24 hours) at 12/28/13 0728 Last data filed at 12/27/13 1900  Gross per 24 hour  Intake  267.5 ml  Output    800 ml  Net -532.5 ml   Filed Weights   12/27/13 0720 12/28/13 0004  Weight: 190 lb (86.183 kg) 184 lb 15.5 oz (83.9 kg)    PHYSICAL EXAM  General: Pleasant, NAD. Face appear to be flushed, however denies any discomfort. Neuro: Alert and oriented X 3. Moves all extremities spontaneously. Psych: Normal affect. HEENT:  Normal  Neck: Supple without bruits or JVD. Lungs:  Resp regular and unlabored. mild rale in dependent L base, otherwise CTA Heart: RRR no s3, s4, or murmurs. R radial cath site stable.  Abdomen: Soft, non-tender, non-distended, BS + x 4.  Extremities: No clubbing,  cyanosis or edema. DP/PT/Radials 2+ and equal bilaterally.  Accessory Clinical Findings  CBC  Recent Labs  12/28/13 0142  WBC 10.4  HGB 13.0  HCT 38.3*  MCV 78.6  PLT 626   Basic Metabolic Panel  Recent Labs  12/28/13 0142  NA 134*  K 3.9  CL 100  CO2 20  GLUCOSE 145*  BUN 15  CREATININE 0.81  CALCIUM 8.8  MG 1.6    TELE NSR with HR 70s, frequent PVCs, 2 episode of 5 beats run of NSVT    ECG  NSR with poor anterior R wave progression  Echocardiogram 04/2006  - Left ventricular size was at the upper limits of normal. Overall    left ventricular systolic function was normal. Left    ventricular ejection fraction was estimated to be 55 %. There    was hypokinesis and thinning of the basal-mid inferior wall. - There was mild mitral annular calcification.    Radiology/Studies  No results found.  ASSESSMENT AND PLAN  1. Class III angina on maximal medical therapy  - Cath 12/27/2013 EF 35-40%, 80-90% prox LAD treated with balloon  angioplasty and 3.0x28 mm Xience DES, LCx with anomalous origin, 50-60% stenosis in OM, 50% stenosis in PDA and PLA  - conitnue ASA, Lipitor, Coreg, plavix and Imdur  - BP may allow addition of low dose lisnopril, also need to monitor for CHF, mild rale in L base, not on any diuretic at home.   - likely discharge today, need outpatient echo with decreased EF  2. CAD 3. HTN 4. HLD 5. NSVT: Mg 1.6, replete with MgSulfate  Signed, Almyra Deforest PA-C Pager: [phone]

## 2013-12-28 NOTE — Telephone Encounter (Signed)
Received a call from patient's daughter she stated Carl Stephenson was released from hospital today.She stated he was disoriented when discharged today but he is worse.Stated he has a headache and slurred speech.Advised to call EMS and take Carl Stephenson to [facility] ER.Trish called.

## 2013-12-28 NOTE — Progress Notes (Signed)
CARDIAC REHAB PHASE I   PRE:  Rate/Rhythm: 71 SR  BP:  Supine: 162/53  Sitting:   Standing:    SaO2:   MODE:  Ambulation: 300 ft   POST:  Rate/Rhythm: 72 SR PVCs  BP:  Supine: 160/64  Sitting:   Standing:    SaO2:  0900-0950 Pt walked 300 ft on RA with gait belt use and rolling walker with fairly steady gait. C/o legs a little weak but had not been walking much in last few days. No CP. Few PVCS noted. Education completed with pt, wife and daughter who voiced understanding. Pt able to answer teach back questions. Gave CHF booklet due to lower EF and encouraged daily weights and watching sodium in addition to carb counting. Discussed stent and importance of plavix and NTG use. Encouraged pt to walk as tolerated using walker or cane and family assisting until stronger. Discussed CRP 2 and permission given to refer to [facility] program. To bed after walk.   Graylon Good, RN BSN  12/28/2013 9:45 AM

## 2013-12-28 NOTE — Telephone Encounter (Signed)
New message      Pt had cath yesterday---came home today.  He is very disoriented.  Bp 147/84.  He is complaining of a headache

## 2013-12-28 NOTE — H&P (Addendum)
[facility] History and Physical  Patient: Carl Stephenson  KDT:[id number]  DOB: Dec 19, 1945  DOS: the patient was seen and examined on 12/28/2013 PCP: No PCP Per Patient  Chief Complaint: Confusion  HPI: [name] is a 68 y.o. 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 The patient presents with complaints of confusion. The history was obtained from patient's wife as the patient is unable to remember anything after his discharge. As per the patient's wife patient had coronary artery disease with multiple procedures and stenting to RCA circumflex as well as ramus intermedius. He presented with complaints of angina and was scheduled for cardiac cath on 12/27/2013. After the procedure the patient was mildly confused and it was thought secondary to medications. The patient remained confused on the second day as per the family and he was discharged home with close follow-up. After going home the patient was having difficulty coming out of the car, removing his seatbelt, feeding himself, buttoning his shirt. Patient went to kitchen to urinate. As per patient's wife before the procedure the patient was normal and did not have any confusion. And due to his mental status changes he was taken to [facility] ER. There was also report of slurred speech which is not patient's baseline. At the time of my evaluation the patient denied any complaint of chest tightness, cough, shortness of breath, headache, blurring of the vision, any focal deficit, sense of nausea, diarrhea or constipation or burning urination. Patient was getting all his medications regularly. Patient did have some reproducible chest pain on the left.  The patient is coming from home. And at his baseline independent for most of his ADL.  Review of Systems: as  mentioned in the history of present illness.  A Comprehensive review of the other systems is negative.  Past Medical History  Diagnosis Date  . Hypertension   . Hyperlipidemia   . GERD (gastroesophageal reflux disease)   . Obesity   . Bladder tumor     Recurrent  . Wide-complex tachycardia   . Ischemic chest pain 12/27/2013  . Cancer     "had TURBT; cancer free since ~ 11/2013/MD" (12/27/2013)  . TIA (transient ischemic attack) "several"  . Coronary artery disease     Cath 12/27/2013 EF 35-40%, 80-90% prox LAD treated with balloon angioplasty and 3.0x28 mm Xience DES, LCx with anomalous origin, 50-60% stenosis in OM, 50% stenosis in PDA and PLA  . Kidney stones     "passed them all" (12/27/2013)  . Inferior myocardial infarction 1994    Inferior, PCI and Streptokinase  . Tobacco user     Quit 1994  . Diabetes mellitus type II   . History of hiatal hernia   . Stroke ~ 2012    "left him weaker on LLE"  . Chronic lower back pain    Past Surgical History  Procedure Laterality Date  . Transurethral resection of bladder tumor with gyrus (turbt-gyrus)  "several times"  . Left heart catheterization with coronary angiogram N/A 10/02/2011    Procedure: LEFT HEART CATHETERIZATION WITH CORONARY ANGIOGRAM;  Surgeon: [name] Bow, MD;  Location: [facility] CATH LAB;  Service: Cardiovascular;  Laterality: N/A;  . Cardiac catheterization      "he's had a couple before today where they just looked around" (12/27/2013)  . Coronary angioplasty with stent placement      "  he's had 3-4 stents put in before today"   . Coronary angioplasty with stent placement  12/27/2013    "1"  . Left heart catheterization with coronary angiogram N/A 12/27/2013    Procedure: LEFT HEART CATHETERIZATION WITH CORONARY ANGIOGRAM;  Surgeon: Blane Ohara, MD;  Location: [facility] CATH LAB;  Service: Cardiovascular;  Laterality: N/A;   Social History:  reports that he quit smoking about 21 years ago. His smoking use included  Cigarettes. He has a 66 pack-year smoking history. He has never used smokeless tobacco. He reports that he does not drink alcohol or use illicit drugs.  Allergies  Allergen Reactions  . Nitrofurantoin Monohyd Macro Hives and Rash    Family History  Problem Relation Age of Onset  . Coronary artery disease Other     Prior to Admission medications   Medication Sig Start Date End Date Taking? Authorizing Provider  aspirin 81 MG tablet Take 81 mg by mouth daily.    Historical Provider, MD  carvedilol (COREG) 25 MG tablet Take 12.5 mg by mouth 2 (two) times daily with a meal.      Historical Provider, MD  clopidogrel (PLAVIX) 75 MG tablet Take 1 tablet (75 mg total) by mouth daily. 12/28/13   Almyra Deforest, PA  fexofenadine (ALLEGRA) 180 MG tablet Take 180 mg by mouth daily.    Historical Provider, MD  gemfibrozil (LOPID) 600 MG tablet Take 600 mg by mouth 2 (two) times daily.     Historical Provider, MD  insulin detemir (LEVEMIR) 100 UNIT/ML injection Inject 65 Units into the skin at bedtime.    Historical Provider, MD  isosorbide mononitrate (IMDUR) 30 MG 24 hr tablet Take 30 mg by mouth daily.      Historical Provider, MD  lisinopril (PRINIVIL,ZESTRIL) 10 MG tablet Take 1 tablet (10 mg total) by mouth daily. 12/28/13   Almyra Deforest, PA  metFORMIN (GLUCOPHAGE) 850 MG tablet Take 850 mg by mouth 3 (three) times daily.      Historical Provider, MD  nitroGLYCERIN (NITROSTAT) 0.4 MG SL tablet Place 1 tablet (0.4 mg total) under the tongue every 5 (five) minutes as needed. May repeat for up to 3 doses. 12/14/13   Blane Ohara, MD  NOVOLOG FLEXPEN 100 UNIT/ML FlexPen 10 Units daily before supper.  10/28/13   Historical Provider, MD  oxazepam (SERAX) 10 MG capsule Take 10 mg by mouth 3 (three) times daily as needed for anxiety.  09/14/13   Historical Provider, MD  pantoprazole (PROTONIX) 40 MG tablet Take 40 mg by mouth daily.      Historical Provider, MD  paroxetine mesylate (PEXEVA) 40 MG tablet Take 20 mg  by mouth every morning.      Historical Provider, MD  PRESCRIPTION MEDICATION Place 1 drop into both eyes 5 (five) times daily. 0.25%    Historical Provider, MD  PRESCRIPTION MEDICATION Place 1 drop into the left eye at bedtime. 1%    Historical Provider, MD  rosuvastatin (CRESTOR) 10 MG tablet Take 1 tablet (10 mg total) by mouth daily at 6 PM. 12/28/13   Almyra Deforest, PA  VESICARE 5 MG tablet Take 5 mg by mouth daily.  10/10/11   Historical Provider, MD    Physical Exam: Filed Vitals:   12/28/13 2233  Height: 5\' 7"  (1.702 m)  Weight: 81.239 kg (179 lb 1.6 oz)    General: Alert, Awake and Oriented to Time, Place and Person. Appear in mild distress Eyes: PERRL ENT: Oral Mucosa clear moist. Neck: no  JVD Cardiovascular: S1 and S2 Present, no Murmur, Peripheral Pulses Present Respiratory: Bilateral Air entry equal and Decreased, Clear to Auscultation, noCrackles, no wheezes Abdomen: Bowel Sound present, Soft and non tender Skin: no Rash Extremities: no Pedal edema, no calf tenderness Neurologic: Right eye ptosis and left facial droop chronic.  Labs on Admission In the other facility:  WBC 9.3  Hemoglobin 13.8  Platelet 173  INR 1.1  PTT 26.9  Sodium 134  Potassium 4.0  Chloride 98  CO2 24  BUN 18  Creatinine 1  Glucose 149  Total bilirubin 1.4  AST 26  ALT 15  Alkaline phosphatase 57 Myoglobin 66.8  Creatinine kinase is 63  Troponin 0.97 (normal less than 0.04) Albumin 3.4  Total protein 6.9  Urine analysis no significant abnormality  CT head chronic changes  Chest x-ray no acute abnormality   EKG: Independently reviewed. sinus bradycardia in the other facility.  Assessment/Plan Principal Problem:   Acute encephalopathy Active Problems:   Hyperlipidemia   CAD, NATIVE VESSEL   GERD   Diabetes mellitus   CVA (cerebral infarction)   Elevated troponin   1. Acute encephalopathy The patient is presenting with complaints of confusion. Patient does not appear to  have any significant focal deficit and his speech is clear but slow. CT of the head is not showing any acute abnormality and his symptoms have been ongoing for more than 24 hours. Initially his symptoms were thought to be secondary to medication that he received during cardiac catheterization. At present neurology has been consulted for further workup. We will follow the recommendation. Check urine drug screen, urine analysis, TSH, ammonia level, blood alcohol level. Serial neuro checks . Nothing by mouth until stroke speech evaluation.  2.Elevated troponin  Patient's troponin is elevated he does not have any complaints of chest pain  EKG in the other facility does not show any evidence evidence of acute ischemia  Currently I will repeat troponin follow serial troponin obtained EKG and will call cardiology for further input  Increasing the dose of aspirin from 81 mg 325 mg continuing with Plavix  Continue with Crestor   3.Diabetes mellitus  Placing the patient on sliding scale  Continue with home insulin   Advance goals of care discussion: Full code   Consults: Cardiology as well as neurology  DVT Prophylaxis: subcutaneous Heparin Nutrition: Nothing by mouth until stroke evaluation   Family Communication: Family  was present at bedside, opportunity was given to ask question and all questions were answered satisfactorily at the time of interview. Disposition: Admitted to inpatient in telemetry unit.  Author: Berle Mull, MD [facility] Pager: [phone] 12/28/2013, 11:00 PM    Addendum Discussed with cardiology fellow on call. Dr. Oval Linsey, cardiology Fellow recommends since the patient does not have any complaint of chest pain and EKG does not show any evidence of new changes increase in troponin is expected after a cardiac cath. They will consult on the patient.  If 7PM-7AM, please contact night-coverage [email or web address] Password [email or web address]

## 2013-12-28 NOTE — Progress Notes (Signed)
Pt had 5 beats of VTACH, pt denied chest pain, SOB, pt in no distress.  Cardiology informed, new orders received, will continue to monitor patient.

## 2013-12-28 NOTE — Discharge Instructions (Signed)
Angina Pectoris Angina pectoris is extreme discomfort in your chest, neck, or arm. Your doctor may call it just angina. It is caused by a lack of oxygen to your heart wall. It may feel like tightness or heavy pressure. It may feel like a crushing or squeezing pain. Some people say it feels like gas. It may go down your shoulders, back, and arms. Some people have symptoms other than pain. These include:  Tiredness.  Shortness of breath.  Cold sweats.  Feeling sick to your stomach (nausea). There are four types of angina:  Stable angina. This type often lasts the same amount of time each time it happens. Activity, stress, or excitement can bring it on. It often gets better after taking a medicine called nitroglycerin. This goes under your tongue.  Unstable angina. This type can happen when you are not active or even during sleep. It can suddenly get worse or happen more often. It may not get better after taking the special medicine. It can last up to 30 minutes.  Microvascular angina. This type is more common in women. It may be more severe or last longer than other types.  Prinzmetal angina. This type often happens when you are not active or in the early morning hours. HOME CARE   Only take medicines as told by your doctor.  Stay active or exercise more as told by your doctor.  Limit very hard activity as told by your doctor.  Limit heavy lifting as told by your doctor.  Keep a healthy weight.  Learn about and eat foods that are healthy for your heart.  Do not use any tobacco such as cigarettes, chewing tobacco, or e-cigarettes. GET HELP RIGHT AWAY IF:   You have chest, neck, deep shoulder, or arm pain or discomfort that lasts more than a few minutes.  You have chest, neck, deep shoulder, or arm pain or discomfort that goes away and comes back over and over again.  You have heavy sweating that seems to happen for no reason.  You have shortness of breath or trouble  breathing.  Your angina does not get better after a few minutes of rest.  Your angina does not get better after you take nitroglycerin medicine. These can all be symptoms of a heart attack. Get help right away. Call your local emergency service (911 in [place] Do not  drive yourself to the hospital. Do not  wait to for your symptoms to go away. MAKE SURE YOU:   Understand these instructions.  Will watch your condition.  Will get help right away if you are not doing well or get worse. Document Released: 06/17/2007 Document Revised: 01/03/2013 Document Reviewed: 05/02/2013 [facility] Patient Information 2015 [place], [place]. This information is not intended to replace advice given to you by your health care provider. Make sure you discuss any questions you have with your health care provider.  No driving for 24 hours. No lifting over 5 lbs for 1 week. No sexual activity for 1 week. Keep procedure site clean & dry. If you notice increased pain, swelling, bleeding or pus, call/return!  You may shower, but no soaking baths/hot tubs/pools for 1 week.   Please hold Metformin for 48 hours after cath, you can resume metformin on 12/30/2013

## 2013-12-28 NOTE — Discharge Summary (Signed)
Discharge Summary   Patient ID: Carl Stephenson,  MRN: [id number], DOB/AGE: [birth year] 6 y.o.  Admit date: 12/27/2013 Discharge date: 12/28/2013  Primary Care Provider: No primary care provider on file. Primary Cardiologist: Dr Burt Knack  Discharge Diagnoses Principal Problem:   Ischemic chest pain Active Problems:   Hyperlipidemia   Obesity   HYPERTENSION, BENIGN   CAD, NATIVE VESSEL   Transient cerebral ischemia   GERD   Diabetes mellitus   CVA (cerebral infarction)   Allergies Allergies  Allergen Reactions  . Nitrofurantoin Monohyd Macro Hives and Rash    Procedures  Cardiac catheterization 12/27/2013 PROCEDURAL FINDINGS Hemodynamics: AO 136/61 LV 135/20  Coronary angiography: Coronary dominance: right  PCI Data: Vessel - LAD/Segment - prox Percent Stenosis (pre) 90 TIMI-flow 3 Stent 3.0x28 mm Xience DES Percent Stenosis (post) 0 TIMI-flow (post) 3  Contrast: 125 cc Omnipaque  Radiation dose/Fluoro time: 8.4 minutes  Estimated Blood Loss: minimal  Final Conclusions:  1. Three-vessel coronary artery disease with severe stenosis of the proximal LAD, continued stent patency in the right coronary artery, anomalous circumflex, and ramus intermedius. 2. Moderate segmental LV systolic dysfunction   Recommendations:  Continued aggressive medical therapy. Lifelong dual antiplatelet therapy with aspirin and Plavix as tolerated. Would anticipate discharge home tomorrow.    Hospital Course  The patient is a 68 year old male with history of CAD status post multiple PCI. His most recent cath in 2013 showed mild/moderate CAD and medical therapy was recommended. Patient was recently seen in the clinic by Dr. Burt Knack on 12/13/2013, at which time he was complaining of chest tightness and left arm pain with physical exertion. He was restarted on Plavix by his neurologist because of recurrent TIA symptoms. The chest discomfort has been going on for  several month. After discussing with the patient regarding possible treatment options, it was decided to undergo cardiac catheterization. He underwent a scheduled cardiac catheterization on 12/27/2013 which noted a 90% proximal LAD stenosis, moderate disease in mid LAD, obtuse marginal, posterior AV segment branch with RCA. His EF was estimated at 35-40% which is down from 50% on cardiac catheterization in 2013. The proximal LAD lesion was treated with a 3.0 x 28 mm Xience DES.  He was seen in the morning of 12/28/2013, at which time he was doing well without significant chest discomfort or shortness of breath. His blood pressure has been mildly elevated, given the worsening ejection fraction, we have added a low-dose lisinopril on top of carvedilol to help with his LV dysfunction. He is deemed stable for discharge from cardiology perspective. He did have some nonsustained VT during this admission, and magnesium was 1.6 in the morning of 12/17, he was repleted with 2 g of mag sulfate. I will schedule follow-up with cardiology clinic in 2-4 weeks. Patient will need to carefully monitor for any sign of heart failure symptoms, as he has no diuretic at home. He also will need echocardiogram as well given newly decreased ejection fraction.  Of note, per pharmacy, the combination of Lipitor and gemfibrozil should be avoided due to increased risk of rhabdomyolysis, however rosuvastatin 10 mg is okay with gemfibrozil. I will change his to Lipitor to rosuvastatin 10 mg. Patient has been instructed to hold metformin for at least 48 hours after cardiac catheterization, and resume on metformin on 12/30/2013.   Discharge Vitals Blood pressure 134/85, pulse 60, temperature 98.5 F (36.9 C), temperature source Oral, resp. rate 20, height 5\' 7"  (1.702 m), weight 184 lb 15.5 oz (83.9 kg), SpO2 97 %.  Filed Weights   12/27/13 0720 12/28/13 0004  Weight: 190 lb (86.183 kg) 184 lb 15.5 oz (83.9 kg)     Labs  CBC  Recent Labs  12/28/13 0142  WBC 10.4  HGB 13.0  HCT 38.3*  MCV 78.6  PLT 160   Basic Metabolic Panel  Recent Labs  12/28/13 0142  NA 134*  K 3.9  CL 100  CO2 20  GLUCOSE 145*  BUN 15  CREATININE 0.81  CALCIUM 8.8  MG 1.6    Disposition  Pt is being discharged home today in good condition.  Follow-up Plans & Appointments      Follow-up Information    Follow up with Ermalinda Barrios, PA-C On 01/16/2014.   Specialty:  Cardiology   Why:  8:15am   Contact information:   [address] [place] [address] [phone]       Discharge Medications    Medication List    STOP taking these medications        atorvastatin 20 MG tablet  Commonly known as:  LIPITOR      TAKE these medications        aspirin 81 MG tablet  Take 81 mg by mouth daily.     carvedilol 25 MG tablet  Commonly known as:  COREG  Take 12.5 mg by mouth 2 (two) times daily with a meal.     clopidogrel 75 MG tablet  Commonly known as:  PLAVIX  Take 1 tablet (75 mg total) by mouth daily.     fexofenadine 180 MG tablet  Commonly known as:  ALLEGRA  Take 180 mg by mouth daily.     insulin detemir 100 UNIT/ML injection  Commonly known as:  LEVEMIR  Inject 65 Units into the skin at bedtime.     isosorbide mononitrate 30 MG 24 hr tablet  Commonly known as:  IMDUR  Take 30 mg by mouth daily.     lisinopril 10 MG tablet  Commonly known as:  PRINIVIL,ZESTRIL  Take 1 tablet (10 mg total) by mouth daily.     LOPID 600 MG tablet  Generic drug:  gemfibrozil  Take 600 mg by mouth 2 (two) times daily.     metFORMIN 850 MG tablet  Commonly known as:  GLUCOPHAGE  Take 850 mg by mouth 3 (three) times daily.     nitroGLYCERIN 0.4 MG SL tablet  Commonly known as:  NITROSTAT  Place 1 tablet (0.4 mg total) under the tongue every 5 (five) minutes as needed. May repeat for up to 3 doses.     NOVOLOG FLEXPEN 100 UNIT/ML FlexPen  Generic drug:  insulin  aspart  10 Units daily before supper.     oxazepam 10 MG capsule  Commonly known as:  SERAX  Take 10 mg by mouth 3 (three) times daily as needed for anxiety.     pantoprazole 40 MG tablet  Commonly known as:  PROTONIX  Take 40 mg by mouth daily.     paroxetine mesylate 40 MG tablet  Commonly known as:  PEXEVA  Take 20 mg by mouth every morning.     PRESCRIPTION MEDICATION  Place 1 drop into both eyes 5 (five) times daily. 0.25%     PRESCRIPTION MEDICATION  Place 1 drop into the left eye at bedtime. 1%     rosuvastatin 10 MG tablet  Commonly known as:  CRESTOR  Take 1 tablet (10 mg total) by mouth daily at 6 PM.  VESICARE 5 MG tablet  Generic drug:  solifenacin  Take 5 mg by mouth daily.         Duration of Discharge Encounter   Greater than 30 minutes including physician time.  Hilbert Corrigan PA-C Pager: [phone] 12/28/2013, 10:25 AM

## 2013-12-29 ENCOUNTER — Inpatient Hospital Stay (HOSPITAL_COMMUNITY): Payer: Medicare Other

## 2013-12-29 ENCOUNTER — Encounter (HOSPITAL_COMMUNITY): Payer: Self-pay | Admitting: General Practice

## 2013-12-29 DIAGNOSIS — I63429 Cerebral infarction due to embolism of unspecified anterior cerebral artery: Secondary | ICD-10-CM

## 2013-12-29 DIAGNOSIS — I517 Cardiomegaly: Secondary | ICD-10-CM

## 2013-12-29 DIAGNOSIS — I639 Cerebral infarction, unspecified: Secondary | ICD-10-CM | POA: Diagnosis present

## 2013-12-29 LAB — GLUCOSE, CAPILLARY
GLUCOSE-CAPILLARY: 166 mg/dL — AB (ref 70–99)
GLUCOSE-CAPILLARY: 227 mg/dL — AB (ref 70–99)
Glucose-Capillary: 114 mg/dL — ABNORMAL HIGH (ref 70–99)
Glucose-Capillary: 170 mg/dL — ABNORMAL HIGH (ref 70–99)

## 2013-12-29 LAB — HEMOGLOBIN A1C
HEMOGLOBIN A1C: 7.3 % — AB (ref <5.7)
Mean Plasma Glucose: 163 mg/dL — ABNORMAL HIGH (ref <117)

## 2013-12-29 LAB — TSH: TSH: 1.07 u[IU]/mL (ref 0.350–4.500)

## 2013-12-29 LAB — LIPID PANEL
Cholesterol: 137 mg/dL (ref 0–200)
HDL: 37 mg/dL — ABNORMAL LOW (ref >39)
LDL Cholesterol: 68 mg/dL (ref 0–99)
Total CHOL/HDL Ratio: 3.7 RATIO
Triglycerides: 161 mg/dL — ABNORMAL HIGH (ref <150)
VLDL: 32 mg/dL (ref 0–40)

## 2013-12-29 LAB — URINALYSIS, ROUTINE W REFLEX MICROSCOPIC
Bilirubin Urine: NEGATIVE
GLUCOSE, UA: NEGATIVE mg/dL
Ketones, ur: NEGATIVE mg/dL
LEUKOCYTES UA: NEGATIVE
NITRITE: NEGATIVE
PH: 6.5 (ref 5.0–8.0)
Protein, ur: NEGATIVE mg/dL
SPECIFIC GRAVITY, URINE: 1.006 (ref 1.005–1.030)
Urobilinogen, UA: 0.2 mg/dL (ref 0.0–1.0)

## 2013-12-29 LAB — COMPREHENSIVE METABOLIC PANEL
ALBUMIN: 3.6 g/dL (ref 3.5–5.2)
ALT: 7 U/L (ref 0–53)
AST: 15 U/L (ref 0–37)
Alkaline Phosphatase: 56 U/L (ref 39–117)
Anion gap: 14 (ref 5–15)
BILIRUBIN TOTAL: 1 mg/dL (ref 0.3–1.2)
BUN: 15 mg/dL (ref 6–23)
CO2: 23 meq/L (ref 19–32)
Calcium: 9.1 mg/dL (ref 8.4–10.5)
Chloride: 99 mEq/L (ref 96–112)
Creatinine, Ser: 0.98 mg/dL (ref 0.50–1.35)
GFR calc Af Amer: >90 mL/min (ref >90)
GFR, EST NON AFRICAN AMERICAN: 82 mL/min — AB (ref >90)
Glucose, Bld: 131 mg/dL — ABNORMAL HIGH (ref 70–99)
POTASSIUM: 3.9 meq/L (ref 3.7–5.3)
SODIUM: 136 meq/L — AB (ref 137–147)
Total Protein: 6.3 g/dL (ref 6.0–8.3)

## 2013-12-29 LAB — RAPID URINE DRUG SCREEN, HOSP PERFORMED
AMPHETAMINES: NOT DETECTED
BENZODIAZEPINES: NOT DETECTED
Barbiturates: NOT DETECTED
COCAINE: NOT DETECTED
Opiates: NOT DETECTED
Tetrahydrocannabinol: NOT DETECTED

## 2013-12-29 LAB — ETHANOL

## 2013-12-29 LAB — CBC
HCT: 36.6 % — ABNORMAL LOW (ref 39.0–52.0)
Hemoglobin: 12.7 g/dL — ABNORMAL LOW (ref 13.0–17.0)
MCH: 27.4 pg (ref 26.0–34.0)
MCHC: 34.7 g/dL (ref 30.0–36.0)
MCV: 78.9 fL (ref 78.0–100.0)
PLATELETS: 144 10*3/uL — AB (ref 150–400)
RBC: 4.64 MIL/uL (ref 4.22–5.81)
RDW: 13.9 % (ref 11.5–15.5)
WBC: 7.1 10*3/uL (ref 4.0–10.5)

## 2013-12-29 LAB — AMMONIA: Ammonia: 10 umol/L — ABNORMAL LOW (ref 11–60)

## 2013-12-29 LAB — URINE MICROSCOPIC-ADD ON

## 2013-12-29 LAB — PROTIME-INR
INR: 1.1 (ref 0.00–1.49)
Prothrombin Time: 14.3 seconds (ref 11.6–15.2)

## 2013-12-29 LAB — APTT: aPTT: 36 seconds (ref 24–37)

## 2013-12-29 LAB — LACTIC ACID, PLASMA: Lactic Acid, Venous: 0.8 mmol/L (ref 0.5–2.2)

## 2013-12-29 LAB — TROPONIN I: Troponin I: 0.82 ng/mL (ref <0.30)

## 2013-12-29 MED ORDER — FENOFIBRATE 160 MG PO TABS
160.0000 mg | ORAL_TABLET | Freq: Every day | ORAL | Status: DC
  Administered 2013-12-30 – 2013-12-31 (×2): 160 mg via ORAL
  Filled 2013-12-29 (×2): qty 1

## 2013-12-29 MED ORDER — IOHEXOL 350 MG/ML SOLN
50.0000 mL | Freq: Once | INTRAVENOUS | Status: AC | PRN
  Administered 2013-12-29: 50 mL via INTRAVENOUS

## 2013-12-29 MED ORDER — INSULIN DETEMIR 100 UNIT/ML ~~LOC~~ SOLN
10.0000 [IU] | Freq: Every day | SUBCUTANEOUS | Status: DC
Start: 2013-12-29 — End: 2013-12-30
  Administered 2013-12-29: 10 [IU] via SUBCUTANEOUS
  Filled 2013-12-29 (×2): qty 0.1

## 2013-12-29 MED ORDER — GEMFIBROZIL 600 MG PO TABS
600.0000 mg | ORAL_TABLET | Freq: Two times a day (BID) | ORAL | Status: AC
  Administered 2013-12-29: 600 mg via ORAL
  Filled 2013-12-29: qty 1

## 2013-12-29 NOTE — Progress Notes (Signed)
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  He had a cardiac cath on 12/16 during which he had a DES placed to the proximal LAD.  Per the records he has been confused since his procedure with elevated bp and he was monitored overnight and discharged on 12/17.  At [facility] he was found to have an elevated troponin (0.97).  MR brain shows small left frontal infarct and small right inferior cerebellar infarct.  Assessment/Plan:  Acute small bilateral CVAs with a history of previous stroke. Neurology consulted.   On aspirin 325 and plavix daily (aspirin increased from 81 mg) On Lopid and Crestor.   TSH WNL, U/A neg, Tox screen neg. 2D echo pending. Carotid dopplers pending. SLP / PT / OT evals ordered.    CAD with elevated troponin after Cath (12/16) Continue medical therapy.  Most recent T1 = 0.82 Cardiology consulted with the patient and family.   Elevated troponin not unexpected after procedure. Outpatient follow up will be scheduled by cards.   Dyslipidemia Triglycerides are 161.  On Lopid and crestor.  After discussion with pharmacy will switch to Tricor and Crestor on 12/19 as Tricor has fewer drug interactions than Lopid.   DM CBGs virtually normal and he has not received any insulin in house yet.  Normally on 65 units of levemir at home plus metformin.  Will continue on SSI and Levemir (10 units rather than 65).  A1C 7.3.    DVT Prophylaxis:  lovenox  Code Status: full Family Communication: Wife called. Disposition Plan: inpatient.   Consultants: Cardiology Neurology  Procedures:  2 D echo completed.  Results pending.  Antibiotics: Anti-infectives    None       HPI/Subjective: No complaints.  Not a good historian.  Objective: Filed Vitals:   12/29/13 0200 12/29/13 0603 12/29/13 0807 12/29/13 1206  BP: 130/53 131/60 143/65 119/57  Pulse: 54 50 51 53  Temp: 98.3 F (36.8 C) 98 F (36.7 C) 97.5 F (36.4 C) 98.2 F (36.8 C)  TempSrc: Oral Oral Oral Oral  Resp: 16 16 18 18   Height:      Weight:      SpO2: 96% 97% 97% 97%    Intake/Output Summary (Last 24 hours) at 12/29/13 1257 Last data filed at 12/29/13 1233  Gross per 24 hour  Intake    270 ml  Output   1250 ml  Net   -980 ml   Filed Weights   12/28/13 2233 12/29/13 0112  Weight: 81.239 kg (179 lb 1.6 oz) 81.239 kg (179 lb 1.6 oz)    Exam: General: Well developed, well nourished, NAD, appears stated age  39:  Has difficulty keeping left eyelid open, Anicteic Sclera, MMM. No pharyngeal erythema or exudates  Neck: Supple, no JVD, no masses  Cardiovascular: RRR, S1 S2 auscultated, no rubs, murmurs or gallops.   Respiratory: Clear to auscultation bilaterally with equal chest rise  Abdomen: Soft, nontender, nondistended, + bowel sounds  Extremities: warm dry without cyanosis clubbing or edema. Neuro: Awake, Alert, Smile asymetric,  Left sided weakness. (I don't think these are acute findings) Skin: Without rashes exudates or nodules.          Data Reviewed:  Basic Metabolic Panel:  Recent Labs Lab 12/28/13 0142 12/28/13 2346  NA 134* 136*  K 3.9 3.9  CL 100 99  CO2 20 23  GLUCOSE 145* 131*  BUN 15 15  CREATININE 0.81 0.98  CALCIUM 8.8 9.1  MG 1.6  --    Liver Function Tests:  Recent Labs Lab 12/28/13 2346  AST 15  ALT 7  ALKPHOS 56  BILITOT 1.0  PROT 6.3  ALBUMIN 3.6    Recent Labs Lab 12/28/13 2345  AMMONIA 10*   CBC:  Recent Labs Lab 12/28/13 0142 12/28/13 2346  WBC 10.4 7.1  HGB 13.0 12.7*  HCT 38.3* 36.6*  MCV 78.6 78.9  PLT 164 144*   Cardiac Enzymes:  Recent Labs Lab 12/29/13 0325  TROPONINI 0.82*   CBG:  Recent  Labs Lab 12/27/13 2159 12/28/13 0701 12/28/13 2338 12/29/13 0742 12/29/13 1206  GLUCAP 165* 120* 122* 114* 166*      Studies: Mr Brain Wo Contrast  12/29/2013   CLINICAL DATA:  Cardiac catheterization December 27, 2013, mild confusion after procedure. Uncoordinated.  EXAM: MRI HEAD WITHOUT CONTRAST  TECHNIQUE: Multiplanar, multiecho pulse sequences of the brain and surrounding structures were obtained without intravenous contrast.  COMPARISON:  CT of the head December 28, 2013 and MRI of the brain August 22, 2010  FINDINGS: Subcentimeter focus of reduced diffusion within RIGHT inferior cerebellum, and no corresponding ADC abnormality. Subcentimeter focus of reduced diffusion within LEFT frontal lobe, axial 26/90.  Ventricles zone sulci are normal for patient's age. Patchy to confluent supratentorial white matter FLAIR T2 hyperintensities. Patchy pontine T2 hyperintensities, progressed from prior imaging. Remote bilateral basal ganglia and LEFT thalamus lacunar infarct, though the LEFT thalamus infarct is new from prior imaging. No midline shift or mass effect. No susceptibility artifact to suggest hemorrhage.  No abnormal extra-axial fluid collections. Dolicoectatic appearing intracranial vessels can be seen with chronic hypertension.  Paranasal sinuses and mastoid air cells appear well-aerated. Ocular globes and orbital contents are unremarkable. No sellar expansion. No cerebellar tonsillar ectopia. No suspicious calvarial bone marrow signal.  IMPRESSION: Sub cm acute LEFT frontal infarct, subacute appearing tiny RIGHT inferior cerebellar infarct.  Moderate to severe white matter changes suggest chronic small vessel ischemic disease. Remote bilateral basal ganglia and LEFT thalamus lacunar infarcts though, LEFT thalamus infarct is new from prior MRI of the brain.   Electronically Signed   By: Elon Alas   On: 12/29/2013 04:52    Scheduled Meds: . aspirin  325 mg Oral Daily  . carvedilol   12.5 mg Oral BID WC  . clopidogrel  75 mg Oral Daily  . enoxaparin (LOVENOX) injection  40 mg Subcutaneous Q24H  . gemfibrozil  600 mg Oral BID  . insulin aspart  0-15 Units Subcutaneous TID WC  . insulin aspart  0-5 Units Subcutaneous QHS  . insulin detemir  10 Units Subcutaneous QHS  . isosorbide mononitrate  30 mg Oral Daily  . lisinopril  10 mg Oral Daily  . pantoprazole  40 mg Oral Daily  . rosuvastatin  10 mg Oral q1800   Continuous Infusions:   Principal Problem:   Acute CVA (cerebrovascular accident) Active Problems:   Hyperlipidemia   CAD, NATIVE VESSEL   GERD   Diabetes mellitus   CVA (cerebral infarction)   Acute encephalopathy   Elevated troponin    Karen Kitchens  [facility] Pager [phone]. If 7PM-7AM, please contact night-coverage at [email or web address], password [facility] 12/29/2013, 12:57 PM  LOS: 1 day  Agree with above assessment and plan. Await ECHO, doppler results. Follow up neurology recommendation.  Idus Rathke, Md.

## 2013-12-29 NOTE — Consult Note (Signed)
   Patient well known to me. 68 year old diabetic gentleman with coronary artery disease and multiple past TIAs and strokes. He underwent elective PCI December 16 after presenting with worsening anginal symptoms. He was found to have severe proximal LAD stenosis and was treated with a drug-eluting stent. His procedure was initially uncomplicated. However, he did have mild confusion in the evening of the procedure. He was discharged home the following day and after returning home he complained of headache and was more confused. He developed dysarthria. He was initially taken to [facility] and then transferred back to [facility] for further care. MRI showed a small acute left frontal infarct and subacute tiny right inferior cerebellar infarct. The patient's troponin was minimally elevated at 0.82.  The patient has greatly improved this morning. Per his wife, he is back to his baseline. On exam he is having no problems with word finding. It is notable that he is very quiet even at his baseline. His mildly elevated troponin is not unexpected after PCI. The patient has no acute EKG changes and no chest pain or shortness of breath. He does not require any further inpatient cardiac evaluation. I will arrange a follow-up appointment with him in the office. I have talked with his daughter over the phone and have seen the patient and his wife this morning. All of their questions were answered. Please feel free to give [place] a call if any cardiac issues arise.  Sherren Mocha 12/29/2013 10:36 AM

## 2013-12-29 NOTE — Evaluation (Signed)
Physical Therapy Evaluation Patient Details Name: Carl Stephenson MRN: [id number] DOB: Nov 17, 1945 Today's Date: 12/29/2013   History of Present Illness  Carl Stephenson is a 68 y.o. 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  Clinical Impression  Pt admitted with above diagnosis. Pt currently with functional limitations due to the deficits listed below (see PT Problem List). At the time of PT eval pt was able to perform transfers and ambulation without an AD. Pt was impulsive at times, trying to initiate transfers before therapist was ready and had set up room. Impulsiveness and decreased awareness of environment increases risk for falls. Pt states that he feels the L knee is weak and unstable - however minimal strength difference noted with MMT R vs L. Will focus strengthening on the left side to improve pt comfort with mobility. Pt will benefit from skilled PT to increase their independence and safety with mobility to allow discharge to the venue listed below.      Follow Up Recommendations Home health PT;Supervision for mobility/OOB    Equipment Recommendations  None recommended by PT    Recommendations for Other Services       Precautions / Restrictions Precautions Precautions: Fall Restrictions Weight Bearing Restrictions: No      Mobility  Bed Mobility Overal bed mobility: Needs Assistance Bed Mobility: Supine to Sit     Supine to sit: Supervision     General bed mobility comments: Pt was able to transition to EOB with use of bed rails and increased time. Supervision for safety as pt was somewhat impulsive. Initiating transfer even though therapist cueing pt to wait until the bed was flat and tray was out of the way.  Transfers Overall transfer level: Needs assistance Equipment used:  None Transfers: Sit to/from Stand Sit to Stand: Supervision         General transfer comment: Supervision for safety as pt again slightly impulsive with initiating transfers. No physical assist required.  Ambulation/Gait Ambulation/Gait assistance: Supervision Ambulation Distance (Feet): 175 Feet Assistive device: None Gait Pattern/deviations: Step-through pattern;Decreased stride length;Trunk flexed;Trendelenburg Gait velocity: Decreased Gait velocity interpretation: Below normal speed for age/gender General Gait Details: Pt was able to ambulate with no AD and supervision for safety. Pt reports fatigue at end of gait training but was not SOB.  Stairs            Wheelchair Mobility    Modified Rankin (Stroke Patients Only) Modified Rankin (Stroke Patients Only) Pre-Morbid Rankin Score: No symptoms Modified Rankin: Moderate disability     Balance Overall balance assessment: No apparent balance deficits (not formally assessed)                                           Pertinent Vitals/Pain Pain Assessment: No/denies pain    Home Living Family/patient expects to be discharged to:: Private residence Living Arrangements: Spouse/significant other Available Help at Discharge: Family;Available 24 hours/day Type of Home: House Home Access: Stairs to enter   [employer] of Steps: 1 Home Layout: One level Home Equipment: Cane - single point;Walker - 2 wheels;Bedside commode;Grab bars - tub/shower      Prior Function Level of Independence: Independent               Hand  Dominance   Dominant Hand: Right    Extremity/Trunk Assessment   Upper Extremity Assessment: Defer to OT evaluation           Lower Extremity Assessment: Generalized weakness      Cervical / Trunk Assessment: Kyphotic  Communication   Communication: No difficulties  Cognition Arousal/Alertness: Awake/alert Behavior During Therapy: WFL for tasks  assessed/performed Overall Cognitive Status: Within Functional Limits for tasks assessed                      General Comments      Exercises        Assessment/Plan    PT Assessment Patient needs continued PT services  PT Diagnosis Difficulty walking;Generalized weakness   PT Problem List Decreased strength;Decreased range of motion;Decreased activity tolerance;Decreased balance;Decreased mobility;Decreased knowledge of use of DME;Decreased safety awareness;Decreased knowledge of precautions  PT Treatment Interventions DME instruction;Gait training;Stair training;Functional mobility training;Therapeutic activities;Therapeutic exercise;Neuromuscular re-education;Patient/family education   PT Goals (Current goals can be found in the Care Plan section) Acute Rehab PT Goals Patient Stated Goal: to go home PT Goal Formulation: With patient/family Time For Goal Achievement: 01/05/14 Potential to Achieve Goals: Good    Frequency Min 4X/week   Barriers to discharge        Co-evaluation               End of Session Equipment Utilized During Treatment: Gait belt Activity Tolerance: Patient tolerated treatment well Patient left: in chair;with call bell/phone within reach;with family/visitor present;with chair alarm set Nurse Communication: Mobility status         Time: 2951-8841 PT Time Calculation (min) (ACUTE ONLY): 21 min   Charges:   PT Evaluation $Initial PT Evaluation Tier I: 1 Procedure PT Treatments $Gait Training: 8-22 mins   PT G Codes:          Rolinda Roan 12/29/2013, 4:43 PM   Rolinda Roan, PT, DPT Acute Rehabilitation Services Pager: [phone]

## 2013-12-29 NOTE — Evaluation (Signed)
Occupational Therapy Evaluation Patient Details Name: Carl Stephenson MRN: [id number] DOB: 1945/08/24 Today's Date: 12/29/2013    History of Present Illness Carl Stephenson is a 68 y.o. 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   Clinical Impression   Patient admitted with above. Patient independent PTA. Patient currently functioning independently. Patient is currently at baseline and does not need any follow-up OT at this time. Therapist educated patient and wife on toilet transfers, shower stall transfers, safety with functional mobility, and importance of continuing left knee HEP for strengthening. Signing off on patient at this time, please follow-up with OT if patient's status changes.     Follow Up Recommendations  No OT follow up    Equipment Recommendations  None recommended by OT (patient has a 3-in-1 for use over toilet seat and in shower prn)    Recommendations for Other Services  none at this time      Precautions / Restrictions Precautions Precautions: Fall Restrictions Weight Bearing Restrictions: No      Mobility Bed Mobility Overal bed mobility: Modified Independent  Transfers Overall transfer level: Independent    Balance Overall balance assessment: No apparent balance deficits (not formally assessed)     ADL Overall ADL's : Independent;At baseline     Vision  No apparent deficits.    Perception Perception Perception Tested?: No   Praxis Praxis Praxis tested?: Within functional limits    Pertinent Vitals/Pain Pain Assessment: No/denies pain     Hand Dominance Right   Extremity/Trunk Assessment Upper Extremity Assessment Upper Extremity Assessment: Generalized weakness   Lower Extremity Assessment Lower Extremity Assessment: Defer to PT evaluation   Cervical /  Trunk Assessment Cervical / Trunk Assessment: Kyphotic   Communication Communication Communication: No difficulties   Cognition Arousal/Alertness: Awake/alert Behavior During Therapy: WFL for tasks assessed/performed Overall Cognitive Status: Within Functional Limits for tasks assessed (wife states his state of confusion has gotten better since this am)  This therapist didn't notice any altered mental status or decreased cognition. Patient oriented X4.              Home Living Family/patient expects to be discharged to:: Private residence Living Arrangements: Spouse/significant other Available Help at Discharge: Family;Available 24 hours/day Type of Home: House Home Access: Level entry     Home Layout: One level     Bathroom Shower/Tub: Walk-in Hydrologist: Standard Bathroom Accessibility: Yes How Accessible: Accessible via walker Home Equipment: Bedside commode;Grab bars - tub/shower    Prior Functioning/Environment Level of Independence: Independent     OT Diagnosis: Generalized weakness   OT Problem List:  (n/a)   OT Treatment/Interventions:      OT Goals(Current goals can be found in the care plan section) Acute Rehab OT Goals Patient Stated Goal: to go home OT Goal Formulation: With patient  OT Frequency:   n/a   Barriers to D/C:  none known at this time.           End of Session Equipment Utilized During Treatment: Gait belt Nurse Communication: Mobility status  Activity Tolerance: Patient tolerated treatment well Patient left: in bed;with family/visitor present   Time: 1315-1340 OT Time Calculation (min): 25 min Charges:  OT General Charges $OT Visit: 1 Procedure OT Evaluation $Initial OT Evaluation Tier I: 1 Procedure OT Treatments $Self Care/Home Management : 8-22 mins Carl Stephenson 12/29/2013, 1:47  PM

## 2013-12-29 NOTE — Progress Notes (Signed)
Echo Lab  2D Echocardiogram completed.  Wilton Thrall L Genette Huertas, RDCS 12/29/2013 11:30 AM

## 2013-12-29 NOTE — Evaluation (Signed)
Clinical/Bedside Swallow Evaluation Patient Details  Name: Carl Stephenson MRN: [id number] Date of Birth: [birth year]  Today's Date: 12/29/2013 Time: 4098-1191 SLP Time Calculation (min) (ACUTE ONLY): 42 min  Past Medical History:  Past Medical History  Diagnosis Date  . Hypertension   . Hyperlipidemia   . GERD (gastroesophageal reflux disease)   . Obesity   . Bladder tumor     Recurrent  . Wide-complex tachycardia   . Ischemic chest pain 12/27/2013  . Cancer     "had TURBT; cancer free since ~ 11/2013/MD" (12/27/2013)  . TIA (transient ischemic attack) "several"  . Coronary artery disease     Cath 12/27/2013 EF 35-40%, 80-90% prox LAD treated with balloon angioplasty and 3.0x28 mm Xience DES, LCx with anomalous origin, 50-60% stenosis in OM, 50% stenosis in PDA and PLA  . Kidney stones     "passed them all" (12/27/2013)  . Inferior myocardial infarction 1994    Inferior, PCI and Streptokinase  . Tobacco user     Quit 1994  . Diabetes mellitus type II   . History of hiatal hernia   . Stroke ~ 2012    "left him weaker on LLE"  . Chronic lower back pain   . Acute encephalopathy admitted 12/28/2013   Past Surgical History:  Past Surgical History  Procedure Laterality Date  . Transurethral resection of bladder tumor with gyrus (turbt-gyrus)  "several times"  . Left heart catheterization with coronary angiogram N/A 10/02/2011    Procedure: LEFT HEART CATHETERIZATION WITH CORONARY ANGIOGRAM;  Surgeon: [name] Bow, MD;  Location: [facility] CATH LAB;  Service: Cardiovascular;  Laterality: N/A;  . Left heart catheterization with coronary angiogram N/A 12/27/2013    Procedure: LEFT HEART CATHETERIZATION WITH CORONARY ANGIOGRAM;  Surgeon: Blane Ohara, MD;  Location: [facility] CATH LAB;  Service: Cardiovascular;  Laterality: N/A;  . Cardiac catheterization      "he's had a couple before today where they just looked around" (12/27/2013)  . Coronary angioplasty with stent placement     "he's had 3-4 stents put in before today"   . 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  He had a cardiac cath on 12/16 during which he had a DES placed to the proximal LAD.  Per the records he has been confused since his procedure with elevated bp and he was monitored overnight and discharged on 12/17.  At [facility] he was found to have an elevated troponin (0.97).  MR brain shows small left frontal infarct and small right inferior cerebellar infarct.   Assessment / Plan / Recommendation Clinical Impression  Patient appears to swallow without overt s/s of aspiration.  Pt and wife report all symptoms are resolving quickly.  Pt is very soft spoken and his wife tends to speak for him, but no aphasia or dysarthria observed, and pt f/c's well.  Occassional throat clearing noted after sips of water, but pt denies difficulty.    Aspiration Risk  Mild    Diet Recommendation Dysphagia 3 (Mechanical Soft);Thin liquid   Liquid Administration via: Cup Medication Administration: Whole meds with puree Supervision: Patient able to self feed;Full supervision/cueing for compensatory strategies Compensations: Slow rate;Small sips/bites Postural Changes and/or Swallow Maneuvers: Seated upright 90  degrees;Upright 30-60 min after meal    Other  Recommendations Oral Care Recommendations: Oral care BID Other Recommendations: Clarify dietary restrictions   Follow Up Recommendations  None;24 hour supervision/assistance    Frequency and Duration min 1 x/week  1 week   Pertinent Vitals/Pain n/a      Swallow Study Prior Functional Status  Type of Home: House Available Help at Discharge: Family;Available 24 hours/day    General HPI: 68 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  He had a cardiac cath on 12/16 during which he had a DES placed to the proximal LAD.  Per the records he has been confused since his procedure with elevated bp and he was monitored overnight and discharged on 12/17.  At [facility] he was found to have an elevated troponin (0.97).  MR brain shows small left frontal infarct and small right inferior cerebellar infarct. Type of Study: Bedside swallow evaluation Previous Swallow Assessment: none Diet Prior to this Study: Regular;Thin liquids Temperature Spikes Noted: No Respiratory Status: Room air History of Recent Intubation: No Behavior/Cognition: Alert;Cooperative;Pleasant mood Oral Cavity - Dentition: Adequate natural dentition Self-Feeding Abilities: Able to feed self Patient Positioning: Upright in bed Baseline Vocal Quality: Clear;Low vocal intensity Volitional Cough: Strong Volitional Swallow: Able to elicit    Oral/Motor/Sensory Function Overall Oral Motor/Sensory Function: Impaired at baseline Labial ROM: Reduced left Labial Symmetry: Abnormal symmetry left Labial Strength: Reduced Lingual ROM: Reduced left Lingual Symmetry: Abnormal symmetry left Lingual Strength: Within Functional Limits Facial ROM: Reduced left Facial Symmetry: Left droop Facial Strength: Reduced Mandible: Within Functional Limits   Ice Chips Ice chips: Within functional limits Presentation: Spoon   Thin Liquid Thin Liquid: Within functional limits Presentation: Cup;Straw    Nectar Thick Nectar Thick Liquid: Not tested   Honey Thick Honey Thick Liquid: Not tested   Puree Puree: Within functional limits Presentation: Self Fed;Spoon   Solid   GO    Solid: Within functional limits Presentation: Self Carl Stephenson,  Carl Stephenson 12/29/2013,3:53 PM

## 2013-12-29 NOTE — Progress Notes (Signed)
VASCULAR LAB PRELIMINARY  PRELIMINARY  PRELIMINARY  PRELIMINARY  Carotid duplex completed.    Preliminary report:  Bilateral - 1% to 39% ICA stenosis. Right Vertebral artery flow is antegrade however has a "thumping" Doppler signal consistent with a more distal occlusion. Left Vertebral artery flow is antegrade.  Tarin Johndrow, RVS 12/29/2013, 3:24 PM

## 2013-12-29 NOTE — Progress Notes (Signed)
RN called with troponin of .82.   Pt's troponin was higher at .41 at [facility] earlier before transfer. Pt has had NO chest pain since arrival and the EKG on admission showed no concerning changes. Given descending value and no chest pain, will just continue to monitor. Pt had a cardiac cath by cardio on 12/27/13-see note.  Clance Boll, NP [facility]

## 2013-12-29 NOTE — Progress Notes (Signed)
CRITICAL VALUE ALERT  Critical value received: Troponin 0.82  Date of notification: 12/29/13  Time of notification:  3818  Critical value read back: Yes   Nurse who received alert: [place] Harlem Bula, RN  MD notified (1st page): Baltazar Najjar, NP  Time of first page: [phone]  MD notified (2nd page):  Time of second page:  Responding MD: Baltazar Najjar, NP   Time MD responded: [email or web address]  NP okay with troponin levels decreasing (.97 at [facility] ED) and no complaints of chest pain - will continue to monitor. No new orders received.

## 2013-12-29 NOTE — Progress Notes (Signed)
Pt arrive to unit in no s/s of distress. Pt A&Ox3 - confused to situation at times. Pt oriented to room and unit. VS stable. Tele applied. Whiteboard updated. Callbell within reach. Will continue to monitor. Report received from nurse Claiborne Billings at [facility] prior to pt's arrival.

## 2013-12-29 NOTE — Progress Notes (Signed)
Dr. Asencion Gowda) notified of SB with occ. Junctional Beats. No new order at this time.

## 2013-12-29 NOTE — Consult Note (Addendum)
Referring Physician: [facility] hospital ED    Chief Complaint: confusion, dysarthria, left arm weakness.  HPI:                                                                                                                                         Carl Stephenson is an 68 y.o. 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 Patient said that he doesn't know why he is back to the hospital, and family is not available at this time. Thus, all clinical information is gathered from patient's chart: patient had cardiac cath 12/27/13 " and after the procedure the patient was mildly confused and it was thought secondary to medications. The patient remained confused on the second day as per the family and he was discharged home with close follow-up. After going home the patient was having difficulty coming out of the car, removing his seatbelt, feeding himself, buttoning his shirt. Patient went to kitchen to urinate. As per patient's wife before the procedure the patient was normal and did not have any confusion. Due to his mental status changes he was taken to [facility] ER. There was also report of slurred speech which is not patient's baseline". Currently, he is awake and following commands appropriately.  Serologies are unimpressive except for mildly elevated troponin. On aspirin and plavix. Date last known well: uncertain Time last known well: uncertain tPA Given: no, out of the window    Past Medical History  Diagnosis Date  . Hypertension   . Hyperlipidemia   . GERD (gastroesophageal reflux disease)   . Obesity   . Bladder tumor     Recurrent  . Wide-complex tachycardia   . Ischemic chest pain 12/27/2013  . Cancer     "had  TURBT; cancer free since ~ 11/2013/MD" (12/27/2013)  . TIA (transient ischemic attack) "several"  . Coronary artery disease     Cath 12/27/2013 EF 35-40%, 80-90% prox LAD treated with balloon angioplasty and 3.0x28 mm Xience DES, LCx with anomalous origin, 50-60% stenosis in OM, 50% stenosis in PDA and PLA  . Kidney stones     "passed them all" (12/27/2013)  . Inferior myocardial infarction 1994    Inferior, PCI and Streptokinase  . Tobacco user     Quit 1994  . Diabetes mellitus type II   . History of hiatal hernia   . Stroke ~ 2012    "left him weaker on LLE"  . Chronic lower back pain     Past Surgical History  Procedure Laterality Date  . Transurethral resection of bladder tumor with gyrus (turbt-gyrus)  "several times"  . Left heart catheterization with coronary angiogram N/A 10/02/2011    Procedure:  LEFT HEART CATHETERIZATION WITH CORONARY ANGIOGRAM;  Surgeon: [name] Bow, MD;  Location: [facility] CATH LAB;  Service: Cardiovascular;  Laterality: N/A;  . Cardiac catheterization      "he's had a couple before today where they just looked around" (12/27/2013)  . Coronary angioplasty with stent placement      "he's had 3-4 stents put in before today"   . Coronary angioplasty with stent placement  12/27/2013    "1"  . Left heart catheterization with coronary angiogram N/A 12/27/2013    Procedure: LEFT HEART CATHETERIZATION WITH CORONARY ANGIOGRAM;  Surgeon: Blane Ohara, MD;  Location: [facility] CATH LAB;  Service: Cardiovascular;  Laterality: N/A;    Family History  Problem Relation Age of Onset  . Coronary artery disease Other    Social History:  reports that he quit smoking about 21 years ago. His smoking use included Cigarettes. He has a 66 pack-year smoking history. He has never used smokeless tobacco. He reports that he does not drink alcohol or use illicit drugs.  Allergies:  Allergies  Allergen Reactions  . Nitrofurantoin Monohyd Macro Hives and Rash    Medications:                                                                                                                            I have reviewed the patient's current medications.  ROS:                                                                                                                                       History obtained from chart review  General ROS: negative for - chills, fatigue, fever, night sweats, weight gain or weight loss Psychological ROS: negative for - behavioral disorder, hallucinations, memory difficulties, mood swings or suicidal ideation Ophthalmic ROS: negative for - blurry vision, double vision, eye pain or loss of vision ENT ROS: negative for - epistaxis, nasal discharge, oral lesions, sore throat, tinnitus or vertigo Allergy and Immunology ROS: negative for - hives or itchy/watery eyes Hematological and Lymphatic ROS: negative for - bleeding problems, bruising or swollen lymph nodes Endocrine ROS: negative for - galactorrhea, hair pattern changes, polydipsia/polyuria or temperature intolerance Respiratory ROS: negative for - cough, hemoptysis, shortness of breath or wheezing Cardiovascular ROS: negative for - chest pain, dyspnea on exertion, edema or irregular heartbeat Gastrointestinal ROS: negative for - abdominal pain, diarrhea,  hematemesis, nausea/vomiting or stool incontinence Genito-Urinary ROS: negative for - dysuria, hematuria, incontinence or urinary frequency/urgency Musculoskeletal ROS: negative for - joint swelling or muscular weakness Neurological ROS: as noted in HPI Dermatological ROS: negative for rash and skin lesion changes  Physical exam: pleasant male in no apparent distress. Height 5\' 7"  (1.702 m), weight 81.239 kg (179 lb 1.6 oz). BP 133/58, P 72, R 17 Head: normocephalic. Neck: supple, no bruits, no JVD. Cardiac: no murmurs. Lungs: clear. Abdomen: soft, no tender, no mass. Extremities: no edema. Neurologic Examination:                                                                                                       General: Mental Status: Alert, oriented, thought content appropriate.  Speech fluent without evidence of aphasia.  Able to follow 3 step commands without difficulty. Cranial Nerves: II: Discs flat bilaterally; Visual fields grossly normal, pupils equal, round, reactive to light and accommodation III,IV, VI: ptosis not present, extra-ocular motions intact bilaterally V,VII: smile symmetric, facial light touch sensation normal bilaterally VIII: hearing normal bilaterally IX,X: gag reflex present XI: bilateral shoulder shrug XII: midline tongue extension without atrophy or fasciculations Motor: Right : Upper extremity   5/5    Left:     Upper extremity   5/5  Lower extremity   5/5     Lower extremity   5/5 Tone and bulk:normal tone throughout; no atrophy noted Sensory: Pinprick and light touch intact throughout, bilaterally Deep Tendon Reflexes:  1+ all over Plantars: Right: downgoing   Left: downgoing Cerebellar: normal finger-to-nose,  normal heel-to-shin test Gait:  No tested due to safety reasons.    Results for orders placed or performed during the hospital encounter of 12/28/13 (from the past 48 hour(s))  Glucose, capillary     Status: Abnormal   Collection Time: 12/28/13 11:38 PM  Result Value Ref Range   Glucose-Capillary 122 (H) 70 - 99 mg/dL   Comment 1 Documented in Chart    Comment 2 Notify RN   CBC     Status: Abnormal   Collection Time: 12/28/13 11:46 PM  Result Value Ref Range   WBC 7.1 4.0 - 10.5 K/uL   RBC 4.64 4.22 - 5.81 MIL/uL   Hemoglobin 12.7 (L) 13.0 - 17.0 g/dL   HCT 36.6 (L) 39.0 - 52.0 %   MCV 78.9 78.0 - 100.0 fL   MCH 27.4 26.0 - 34.0 pg   MCHC 34.7 30.0 - 36.0 g/dL   RDW 13.9 11.5 - 15.5 %   Platelets 144 (L) 150 - 400 K/uL   No results found.   Assessment: 68 y.o. male transferred to [facility] for further evaluation of confusion, dysarthria, left arm weakness after  cardiac cath 12/27/13. At this moment I can not appreciate focal findings on exam and his mental status seems to be appropriate. No evidence of infection or metabolic derangements. If medical causes of confusion are excluded, then a cardiac procedure related stroke involving the [place] circuit would be a consideration. Will suggest MRI brain and further stroke work up if  MRI positive for stroke.  Dorian Pod ,MD [facility] [phone]  12/29/2013, 12:30 AM

## 2013-12-29 NOTE — Progress Notes (Signed)
STROKE TEAM PROGRESS NOTE   HISTORY Carl Stephenson is an 68 y.o. 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 Patient said that he doesn't know why he is back to the hospital, and family is not available at this time. Thus, all clinical information is gathered from patient's chart: patient had cardiac cath 12/27/13 " and after the procedure the patient was mildly confused and it was thought secondary to medications. The patient remained confused on the second day as per the family and he was discharged home with close follow-up. After going home the patient was having difficulty coming out of the car, removing his seatbelt, feeding himself, buttoning his shirt. Patient went to kitchen to urinate. As per patient's wife before the procedure the patient was normal and did not have any confusion. Due to his mental status changes he was taken to [facility] ER. There was also report of slurred speech which is not patient's baseline". Currently, he is awake and following commands appropriately.  Serologies are unimpressive except for mildly elevated troponin. On aspirin and plavix. Date last known well: uncertain Time last known well: uncertain tPA Given: no, out of the window   SUBJECTIVE (INTERVAL HISTORY) Wife is with the patient today. The patient had some confusion following or an area catheterization and stent placement, the wife believes that the mental status has improved at or near baseline today.   OBJECTIVE Temp:  [97.5 F (36.4 C)-98.3 F (36.8 C)] 97.5 F (36.4 C) (12/18 0807) Pulse Rate:  [50-54] 51 (12/18 0807) Cardiac Rhythm:  [-] Sinus bradycardia (12/18 0821) Resp:  [16-18] 18 (12/18 0807) BP:  (130-143)/(53-65) 143/65 mmHg (12/18 0807) SpO2:  [96 %-97 %] 97 % (12/18 0807) Weight:  [179 lb (81.194 kg)-179 lb 1.6 oz (81.239 kg)] 179 lb (81.194 kg) (12/18 0408)   Recent Labs Lab 12/27/13 1731 12/27/13 2159 12/28/13 0701 12/28/13 2338 12/29/13 0742  GLUCAP 167* 165* 120* 122* 114*    Recent Labs Lab 12/28/13 0142 12/28/13 2346  NA 134* 136*  K 3.9 3.9  CL 100 99  CO2 20 23  GLUCOSE 145* 131*  BUN 15 15  CREATININE 0.81 0.98  CALCIUM 8.8 9.1  MG 1.6  --     Recent Labs Lab 12/28/13 2346  AST 15  ALT 7  ALKPHOS 56  BILITOT 1.0  PROT 6.3  ALBUMIN 3.6    Recent Labs Lab 12/28/13 0142 12/28/13 2346  WBC 10.4 7.1  HGB 13.0 12.7*  HCT 38.3* 36.6*  MCV 78.6 78.9  PLT 164 144*    Recent Labs Lab 12/29/13 0325  TROPONINI 0.82*    Recent Labs  12/28/13 2346  LABPROT 14.3  INR 1.10    Recent Labs  12/29/13 0040  COLORURINE YELLOW  LABSPEC 1.006  PHURINE 6.5  GLUCOSEU NEGATIVE  HGBUR MODERATE*  BILIRUBINUR NEGATIVE  KETONESUR NEGATIVE  PROTEINUR NEGATIVE  UROBILINOGEN 0.2  NITRITE NEGATIVE  LEUKOCYTESUR NEGATIVE       Component Value Date/Time   CHOL 137 12/29/2013 0325   TRIG 161* 12/29/2013 0325   HDL 37* 12/29/2013 0325   CHOLHDL 3.7 12/29/2013 0325   VLDL 32 12/29/2013 0325   LDLCALC 68 12/29/2013 0325   Lab Results  Component Value Date   HGBA1C 7.3* 12/28/2013  Component Value Date/Time   LABOPIA NONE DETECTED 12/29/2013 0040   COCAINSCRNUR NONE DETECTED 12/29/2013 0040   LABBENZ NONE DETECTED 12/29/2013 0040   AMPHETMU NONE DETECTED 12/29/2013 0040   THCU NONE DETECTED 12/29/2013 0040   LABBARB NONE DETECTED 12/29/2013 0040     Recent Labs Lab 12/28/13 2346  ETH <11    Mr Brain Wo Contrast  12/29/2013   CLINICAL DATA:  Cardiac catheterization December 27, 2013, mild confusion after procedure. Uncoordinated.  EXAM: MRI HEAD WITHOUT CONTRAST  TECHNIQUE: Multiplanar, multiecho pulse sequences of the brain  and surrounding structures were obtained without intravenous contrast.  COMPARISON:  CT of the head December 28, 2013 and MRI of the brain August 22, 2010  FINDINGS: Subcentimeter focus of reduced diffusion within RIGHT inferior cerebellum, and no corresponding ADC abnormality. Subcentimeter focus of reduced diffusion within LEFT frontal lobe, axial 26/90.  Ventricles zone sulci are normal for patient's age. Patchy to confluent supratentorial white matter FLAIR T2 hyperintensities. Patchy pontine T2 hyperintensities, progressed from prior imaging. Remote bilateral basal ganglia and LEFT thalamus lacunar infarct, though the LEFT thalamus infarct is new from prior imaging. No midline shift or mass effect. No susceptibility artifact to suggest hemorrhage.  No abnormal extra-axial fluid collections. Dolicoectatic appearing intracranial vessels can be seen with chronic hypertension.  Paranasal sinuses and mastoid air cells appear well-aerated. Ocular globes and orbital contents are unremarkable. No sellar expansion. No cerebellar tonsillar ectopia. No suspicious calvarial bone marrow signal.  IMPRESSION: Sub cm acute LEFT frontal infarct, subacute appearing tiny RIGHT inferior cerebellar infarct.  Moderate to severe white matter changes suggest chronic small vessel ischemic disease. Remote bilateral basal ganglia and LEFT thalamus lacunar infarcts though, LEFT thalamus infarct is new from prior MRI of the brain.   Electronically Signed   By: Elon Alas   On: 12/29/2013 04:52     Physical Exam  General: The patient is alert and cooperative at the time of the examination.  Skin: No significant peripheral edema is noted.   Neurologic Exam  Mental status: The patient is oriented x 3.  Cranial nerves: Facial symmetry is not present. Slight depression of the left nasolabial fold is noted. Speech is normal, no aphasia or dysarthria is noted. Extraocular movements are full. Visual fields are full.  Motor:  The patient has good strength in all 4 extremities.  Sensory examination: Soft touch sensation is symmetric on the face, arms, and legs.  Coordination: The patient has good finger-nose-finger and heel-to-shin bilaterally.  Gait and station: The gait was not tested.  Reflexes: Deep tendon reflexes are symmetric.    ASSESSMENT/PLAN [name] is a 68 y.o. male with history of confusion following catheterization presenting with punctate infarcts of left frontal, right parietal, right cerebellum. He did not receive IV t-PA due to delay in presentation.   Stroke:     Resultant  confusion MRI  IMPRESSION: Sub cm acute LEFT frontal infarct, subacute appearing tiny RIGHT inferior cerebellar infarct.  Moderate to severe white matter changes suggest chronic small vessel ischemic disease. Remote bilateral basal ganglia and LEFT thalamus lacunar infarcts though, LEFT thalamus infarct is new from prior MRI  of the brain.  MRA    Carotid Doppler  pending  2D Echo  pending  LDL 68  HgbA1c 7.3  Lovenox for VTE prophylaxis  Diet heart healthy/carb modified then liquids  aspirin 81 mg orally every day and clopidogrel 75 mg orally every day prior to admission, now on aspirin 325 mg orally  every day and clopidogrel 75 mg orally every day  Patient counseled to be compliant with his antithrombotic medications  Ongoing aggressive stroke risk factor management  Therapy recommendations:  Pending  Disposition:  Pending  Hypertension  Home meds:   Lisinopril, Coreg  Patient counseled to be compliant with his blood pressure medications  Hyperlipidemia  Home meds:  Crestor, Lopid resumed in hospital  LDL 68, goal < 70  Continue statin at discharge  Diabetes  HgbA1c 7.3 goal < 7.0  Controlled  Other Stroke Risk Factors  Advanced age  Former Cigarette smoker, advised to stop smoking   Body mass index is 28.04 kg/(m^2).   Hx stroke/TIA  Coronary artery  disease  Other Active Problems  Gastroesophageal reflux disease  Cancer  Other Pertinent History  Hospital day # 1  This patient presents with confusion following a coronary artery catheterization and stent placement. Patient appears to have punctate acute strokes in the right prior area, right cerebellum, left frontal area. These infarcts are quite small, doubtful that they are the etiology of the confusion. Infarcts as disease may be related to the catheterization process itself, particularly if atherosclerotic plaque is also found in the aorta.  Carotid Doppler study, 2-D echocardiogram pending. The patient likely does not need much in the way of therapy input.    To contact Stroke Continuity provider, please refer to [email or web address] After hours, contact General Neurology

## 2013-12-29 NOTE — Progress Notes (Signed)
Dr. Tyrell Antonio  Notified of sinus brady with occ juctional beats.

## 2013-12-30 DIAGNOSIS — E1149 Type 2 diabetes mellitus with other diabetic neurological complication: Secondary | ICD-10-CM

## 2013-12-30 LAB — GLUCOSE, CAPILLARY
GLUCOSE-CAPILLARY: 202 mg/dL — AB (ref 70–99)
GLUCOSE-CAPILLARY: 247 mg/dL — AB (ref 70–99)
Glucose-Capillary: 156 mg/dL — ABNORMAL HIGH (ref 70–99)
Glucose-Capillary: 189 mg/dL — ABNORMAL HIGH (ref 70–99)

## 2013-12-30 LAB — TROPONIN I: Troponin I: 0.57 ng/mL (ref <0.30)

## 2013-12-30 MED ORDER — CARVEDILOL 6.25 MG PO TABS
6.2500 mg | ORAL_TABLET | Freq: Two times a day (BID) | ORAL | Status: DC
  Administered 2013-12-30 – 2013-12-31 (×2): 6.25 mg via ORAL
  Filled 2013-12-30 (×4): qty 1

## 2013-12-30 MED ORDER — INSULIN DETEMIR 100 UNIT/ML ~~LOC~~ SOLN
15.0000 [IU] | Freq: Every day | SUBCUTANEOUS | Status: DC
Start: 2013-12-30 — End: 2013-12-31
  Administered 2013-12-30: 15 [IU] via SUBCUTANEOUS
  Filled 2013-12-30 (×2): qty 0.15

## 2013-12-30 NOTE — Progress Notes (Signed)
Speech Language Pathology Treatment: Dysphagia  Patient Details Name: Carl Stephenson MRN: [id number] DOB: January 10, 1946 Today's Date: 12/30/2013 Time: 9191-6606 SLP Time Calculation (min) (ACUTE ONLY): 11 min  Assessment / Plan / Recommendation Clinical Impression  Pt seen for f/u dysphagia treatment. Pt consumed regular textures and thin liquids with Mod I for use of safe swallow precautions. No overt signs of aspiration were observed during trials, with one delayed cough overheard after SLP had left the room - suspect may be related to h/o GERD. Pt appears to be tolerating current diet well - no further SLP treatment indicated at this time for swallow. Pt/family are in agreement.   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  He had a cardiac cath on 12/16 during which he had a DES placed to the proximal LAD.  Per the records he has been confused since his procedure with elevated bp and he was monitored overnight and discharged on 12/17.  At [facility] he was found to have an elevated troponin (0.97).  MR brain shows small left frontal infarct and small right inferior cerebellar infarct.   Pertinent Vitals Pain Assessment: No/denies pain  SLP Plan  All goals met    Recommendations Diet recommendations: Dysphagia 3 (mechanical soft);Thin liquid Liquids provided via: Cup;Straw Medication Administration: Whole meds with puree Supervision: Patient able to self feed;Intermittent supervision to cue for compensatory strategies Compensations: Slow rate;Small sips/bites Postural Changes and/or Swallow Maneuvers: Seated upright 90 degrees;Upright 30-60 min after meal              Oral Care Recommendations: Oral care BID Follow up Recommendations: None Plan: All goals met    GO      Germain Osgood, M.A. CCC-SLP [phone]  Germain Osgood 12/30/2013, 11:15 AM

## 2013-12-30 NOTE — Progress Notes (Signed)
  Dr. Dyann Kief notified of BP and HR. See new order.

## 2013-12-30 NOTE — Progress Notes (Signed)
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  He had a cardiac cath on 12/16 during which he had a DES placed to the proximal LAD.  Per the records he has been confused since his procedure with elevated bp and he was monitored overnight and discharged on 12/17.  At [facility] he was found to have an elevated troponin (0.97).  MR brain shows small left frontal infarct and small right inferior cerebellar infarct.  Assessment/Plan: Acute small bilateral CVAs with a history of previous stroke. Neurology consulted.   On aspirin 325 and plavix daily (aspirin increased from 81 mg) On tricor and Crestor.   TSH WNL, U/A neg, Tox screen neg.  Carotid duplex with mild ICD stenosis SLP / PT / OT evals ordered. Home in am if stable with [facility] services.    CAD with elevated troponin after Cath (12/16) Continue medical therapy.  Most recent T1 = 0.82 Cardiology consulted with the patient and family.  Elevated troponin not unexpected after procedure. Outpatient follow up will be scheduled by cards.  Dyslipidemia Triglycerides are 161.   -will continue crestor and tricor now  DM Normally on 65 units of levemir at home plus metformin.   Will continue on SSI and Levemir (15 units rather than 65 for now). CBG's 140-200   A1C 7.3. No eating a whole lot  transient V-tach -will monitor on telemetry -check Mg, Po4 and K -patient asymptomatic  DVT Prophylaxis:  lovenox  Code Status: full Family Communication: Wife at bedside. Disposition Plan: inpatient. If stable home with [facility] services on 12/20  Consultants: Cardiology Neurology  Procedures:  2 D echo completed.  Results  pending.  Antibiotics: Anti-infectives    None      HPI/Subjective: No complaints.  No fever. Patient with transient run of v-tach (12 beats) asymptomatic  Objective: Filed Vitals:   12/30/13 0553 12/30/13 1021 12/30/13 1529 12/30/13 1538  BP: 141/70 122/49 87/46 104/51  Pulse: 51 54 48 52  Temp: 98.1 F (36.7 C) 97.7 F (36.5 C) 97.3 F (36.3 C)   TempSrc: Oral Oral Oral   Resp: 18 18 18    Height:      Weight:      SpO2: 98% 96% 97%     Intake/Output Summary (Last 24 hours) at 12/30/13 1711 Last data filed at 12/30/13 1000  Gross per 24 hour  Intake    480 ml  Output    200 ml  Net    280 ml   Filed Weights   12/28/13 2233 12/29/13 0112  Weight: 81.239 kg (179 lb 1.6 oz) 81.239 kg (179 lb 1.6 oz)    Exam: General: Well developed, well nourished, NAD, appears stated age  Cardiovascular: RRR, S1 S2 auscultated, no rubs, murmurs or gallops.   Respiratory: Clear to auscultation bilaterally  Abdomen: Soft, nontender, nondistended, + bowel sounds  Extremities: warm dry without cyanosis clubbing or edema. Neuro: Awake, Alert, Smile asymetric,  Left sided weakness.   Data Reviewed: Basic Metabolic Panel:  Recent Labs Lab 12/28/13 0142 12/28/13 2346  NA 134* 136*  K 3.9 3.9  CL 100 99  CO2 20 23  GLUCOSE 145* 131*  BUN 15 15  CREATININE 0.81  0.98  CALCIUM 8.8 9.1  MG 1.6  --    Liver Function Tests:  Recent Labs Lab 12/28/13 2346  AST 15  ALT 7  ALKPHOS 56  BILITOT 1.0  PROT 6.3  ALBUMIN 3.6    Recent Labs Lab 12/28/13 2345  AMMONIA 10*   CBC:  Recent Labs Lab 12/28/13 0142 12/28/13 2346  WBC 10.4 7.1  HGB 13.0 12.7*  HCT 38.3* 36.6*  MCV 78.6 78.9  PLT 164 144*   Cardiac Enzymes:  Recent Labs Lab 12/29/13 0325 12/30/13 0400  TROPONINI 0.82* 0.57*   CBG:  Recent Labs Lab 12/29/13 1206 12/29/13 1632 12/29/13 2219 12/30/13 0822 12/30/13 1200  GLUCAP 166* 227* 170* 156* 202*   Studies: Ct Angio Head W/cm &/or Wo  Cm  12/29/2013   CLINICAL DATA:  Initial evaluation for stroke. Recent cardiac catheterization with stent placement. Subsequently developed confusion. Found have small ischemic infarcts on prior brain MRI. History of multiple TIAs.  EXAM: CT ANGIOGRAPHY HEAD AND NECK  TECHNIQUE: Multidetector CT imaging of the head and neck was performed using the standard protocol during bolus administration of intravenous contrast. Multiplanar CT image reconstructions and MIPs were obtained to evaluate the vascular anatomy. Carotid stenosis measurements (when applicable) are obtained utilizing NASCET criteria, using the distal internal carotid diameter as the denominator.  CONTRAST:  33mL OMNIPAQUE IOHEXOL 350 MG/ML SOLN  COMPARISON:  Prior MRI performed earlier on the same day.  FINDINGS: CT HEAD WITHOUT CONTRAST:  Precontrast imaging of the brain demonstrates global atrophy with moderate chronic microvascular ischemic disease involving the supratentorial white matter. Small vessel type changes present within the pons as well. Remote lacunar infarct present within the left pons. Remote basal ganglia infarcts again noted, better evaluated on prior MRI.  Previously identified tiny ischemic infarcts in the left frontal and right cerebellar regions not seen on this exam. No acute large vessel territory infarct. No intracranial hemorrhage. No extra-axial fluid collection. Ventricles normal size without evidence of hydrocephalus.  Scalp soft tissues within normal limits. Calvarium intact. No acute abnormality seen about the orbits.  Paranasal sinuses and mastoid air cells are clear.  CTA HEAD FINDINGS  ANTERIOR CIRCULATION:  The petrous segments of the internal carotid arteries are widely patent bilaterally. Prominent atheromatous plaque present within the cavernous carotid arteries but without resultant hemodynamically significant stenosis. Supra clinoid segments well opacified.  M1 segments well opacified bilaterally without  proximal branch occlusion or hemodynamically significant stenosis. MCA bifurcations within normal limits. Distal MCA branches well opacified and symmetric bilaterally. No M2 or in 3 branch occlusion appreciated.  A1 segments, anterior communicating artery, and anterior cerebral arteries within normal limits.  POSTERIOR CIRCULATION:  Vertebral arteries are codominant and well opacified to the level of the vertebrobasilar junction without significant stenosis. Minimal atherosclerotic plaque present within the left V4 segment. Posterior inferior cerebral arteries well opacified bilaterally. Basilar artery widely patent. No basilar tip stenosis or aneurysm. Posterior cerebral arteries well opacified bilaterally. Minimal multi focal atherosclerotic irregularity noted within the P2 segments bilaterally. Superior cerebral arteries appear to arise off the P1 segments. The superior cerebellar arteries and cells are well opacified. Anterior inferior cerebral arteries not well visualized on this exam.  No aneurysm or vascular malformation within the intracranial circulation.  CTA NECK FINDINGS:  The visualized aortic arch is of normal caliber with normal 3 vessel morphology. No high-grade stenosis seen at the origin of the great vessels. Visualized subclavian arteries widely patent. Scattered atheromatous plaque present within the proximal left  subclavian artery without significant stenosis.  Common carotid arteries mildly tortuous proximally but widely patent. Scattered calcified plaque present about the carotid bifurcations without hemodynamically significant stenosis. Plaque extends into the proximal aspects of the internal carotid arteries bilaterally. No significant stenosis. No other acute abnormality such as dissection or vascular occlusion. No penetrating atherosclerotic plaques appreciated.  External carotid arteries and their branches are within normal limits.  Both vertebral arteries arise from the subclavian  arteries. Vertebral arteries appear to be codominant. Vertebral arteries are well opacified to the level of the skullbase without significant stenosis, dissection, or occlusion.  Paraseptal emphysematous changes present within the visualized lungs. Thyroid gland within normal limits. No adenopathy. No soft tissue mass or loculated collection identified within the neck. Airway widely patent and midline. Paranasal sinuses are clear.  Moderate multilevel degenerative disc disease seen within the cervical spine, most severe at C5-6. No acute osseus abnormality. No worrisome lytic or blastic osseous lesions.  IMPRESSION: CTA HEAD IMPRESSION:  1. No proximal branch occlusion or hemodynamically significant stenosis identified within the intracranial circulation. 2. Prominent calcified atheromatous plaque within the cavernous segments of the internal carotid arteries bilaterally. 3. Atrophy with moderate to advanced chronic microvascular ischemic disease involving the supratentorial white matter and pons. 4. Small remote lacunar infarcts involving the bilateral basal ganglia and pons.  CTA NECK IMPRESSION:  1. No hemodynamically significant stenosis identified within the major arterial vasculature of the neck. 2. Mild to moderate calcified atheromatous disease about the carotid bifurcations and proximal internal carotid arteries bilaterally.   Electronically Signed   By: Jeannine Boga M.D.   On: 12/29/2013 22:47   Ct Angio Neck W/cm &/or Wo/cm  12/29/2013   CLINICAL DATA:  Initial evaluation for stroke. Recent cardiac catheterization with stent placement. Subsequently developed confusion. Found have small ischemic infarcts on prior brain MRI. History of multiple TIAs.  EXAM: CT ANGIOGRAPHY HEAD AND NECK  TECHNIQUE: Multidetector CT imaging of the head and neck was performed using the standard protocol during bolus administration of intravenous contrast. Multiplanar CT image reconstructions and MIPs were obtained  to evaluate the vascular anatomy. Carotid stenosis measurements (when applicable) are obtained utilizing NASCET criteria, using the distal internal carotid diameter as the denominator.  CONTRAST:  35mL OMNIPAQUE IOHEXOL 350 MG/ML SOLN  COMPARISON:  Prior MRI performed earlier on the same day.  FINDINGS: CT HEAD WITHOUT CONTRAST:  Precontrast imaging of the brain demonstrates global atrophy with moderate chronic microvascular ischemic disease involving the supratentorial white matter. Small vessel type changes present within the pons as well. Remote lacunar infarct present within the left pons. Remote basal ganglia infarcts again noted, better evaluated on prior MRI.  Previously identified tiny ischemic infarcts in the left frontal and right cerebellar regions not seen on this exam. No acute large vessel territory infarct. No intracranial hemorrhage. No extra-axial fluid collection. Ventricles normal size without evidence of hydrocephalus.  Scalp soft tissues within normal limits. Calvarium intact. No acute abnormality seen about the orbits.  Paranasal sinuses and mastoid air cells are clear.  CTA HEAD FINDINGS  ANTERIOR CIRCULATION:  The petrous segments of the internal carotid arteries are widely patent bilaterally. Prominent atheromatous plaque present within the cavernous carotid arteries but without resultant hemodynamically significant stenosis. Supra clinoid segments well opacified.  M1 segments well opacified bilaterally without proximal branch occlusion or hemodynamically significant stenosis. MCA bifurcations within normal limits. Distal MCA branches well opacified and symmetric bilaterally. No M2 or in 3 branch occlusion appreciated.  A1 segments, anterior  communicating artery, and anterior cerebral arteries within normal limits.  POSTERIOR CIRCULATION:  Vertebral arteries are codominant and well opacified to the level of the vertebrobasilar junction without significant stenosis. Minimal atherosclerotic  plaque present within the left V4 segment. Posterior inferior cerebral arteries well opacified bilaterally. Basilar artery widely patent. No basilar tip stenosis or aneurysm. Posterior cerebral arteries well opacified bilaterally. Minimal multi focal atherosclerotic irregularity noted within the P2 segments bilaterally. Superior cerebral arteries appear to arise off the P1 segments. The superior cerebellar arteries and cells are well opacified. Anterior inferior cerebral arteries not well visualized on this exam.  No aneurysm or vascular malformation within the intracranial circulation.  CTA NECK FINDINGS:  The visualized aortic arch is of normal caliber with normal 3 vessel morphology. No high-grade stenosis seen at the origin of the great vessels. Visualized subclavian arteries widely patent. Scattered atheromatous plaque present within the proximal left subclavian artery without significant stenosis.  Common carotid arteries mildly tortuous proximally but widely patent. Scattered calcified plaque present about the carotid bifurcations without hemodynamically significant stenosis. Plaque extends into the proximal aspects of the internal carotid arteries bilaterally. No significant stenosis. No other acute abnormality such as dissection or vascular occlusion. No penetrating atherosclerotic plaques appreciated.  External carotid arteries and their branches are within normal limits.  Both vertebral arteries arise from the subclavian arteries. Vertebral arteries appear to be codominant. Vertebral arteries are well opacified to the level of the skullbase without significant stenosis, dissection, or occlusion.  Paraseptal emphysematous changes present within the visualized lungs. Thyroid gland within normal limits. No adenopathy. No soft tissue mass or loculated collection identified within the neck. Airway widely patent and midline. Paranasal sinuses are clear.  Moderate multilevel degenerative disc disease seen within  the cervical spine, most severe at C5-6. No acute osseus abnormality. No worrisome lytic or blastic osseous lesions.  IMPRESSION: CTA HEAD IMPRESSION:  1. No proximal branch occlusion or hemodynamically significant stenosis identified within the intracranial circulation. 2. Prominent calcified atheromatous plaque within the cavernous segments of the internal carotid arteries bilaterally. 3. Atrophy with moderate to advanced chronic microvascular ischemic disease involving the supratentorial white matter and pons. 4. Small remote lacunar infarcts involving the bilateral basal ganglia and pons.  CTA NECK IMPRESSION:  1. No hemodynamically significant stenosis identified within the major arterial vasculature of the neck. 2. Mild to moderate calcified atheromatous disease about the carotid bifurcations and proximal internal carotid arteries bilaterally.   Electronically Signed   By: Jeannine Boga M.D.   On: 12/29/2013 22:47   Mr Brain Wo Contrast  12/29/2013   CLINICAL DATA:  Cardiac catheterization December 27, 2013, mild confusion after procedure. Uncoordinated.  EXAM: MRI HEAD WITHOUT CONTRAST  TECHNIQUE: Multiplanar, multiecho pulse sequences of the brain and surrounding structures were obtained without intravenous contrast.  COMPARISON:  CT of the head December 28, 2013 and MRI of the brain August 22, 2010  FINDINGS: Subcentimeter focus of reduced diffusion within RIGHT inferior cerebellum, and no corresponding ADC abnormality. Subcentimeter focus of reduced diffusion within LEFT frontal lobe, axial 26/90.  Ventricles zone sulci are normal for patient's age. Patchy to confluent supratentorial white matter FLAIR T2 hyperintensities. Patchy pontine T2 hyperintensities, progressed from prior imaging. Remote bilateral basal ganglia and LEFT thalamus lacunar infarct, though the LEFT thalamus infarct is new from prior imaging. No midline shift or mass effect. No susceptibility artifact to suggest hemorrhage.   No abnormal extra-axial fluid collections. Dolicoectatic appearing intracranial vessels can be seen with chronic hypertension.  Paranasal sinuses and mastoid air cells appear well-aerated. Ocular globes and orbital contents are unremarkable. No sellar expansion. No cerebellar tonsillar ectopia. No suspicious calvarial bone marrow signal.  IMPRESSION: Sub cm acute LEFT frontal infarct, subacute appearing tiny RIGHT inferior cerebellar infarct.  Moderate to severe white matter changes suggest chronic small vessel ischemic disease. Remote bilateral basal ganglia and LEFT thalamus lacunar infarcts though, LEFT thalamus infarct is new from prior MRI of the brain.   Electronically Signed   By: Elon Alas   On: 12/29/2013 04:52    Scheduled Meds: . aspirin  325 mg Oral Daily  . carvedilol  6.25 mg Oral BID WC  . clopidogrel  75 mg Oral Daily  . enoxaparin (LOVENOX) injection  40 mg Subcutaneous Q24H  . fenofibrate  160 mg Oral Daily  . insulin aspart  0-15 Units Subcutaneous TID WC  . insulin aspart  0-5 Units Subcutaneous QHS  . insulin detemir  10 Units Subcutaneous QHS  . isosorbide mononitrate  30 mg Oral Daily  . lisinopril  10 mg Oral Daily  . pantoprazole  40 mg Oral Daily  . rosuvastatin  10 mg Oral q1800   Continuous Infusions:   Principal Problem:   Acute CVA (cerebrovascular accident) Active Problems:   Hyperlipidemia   CAD, NATIVE VESSEL   GERD   Diabetes mellitus   CVA (cerebral infarction)   Acute encephalopathy   Elevated troponin    Barton Dubois MD Pager [phone]. If 7PM-7AM, please contact night-coverage at [email or web address], password [facility] 12/30/2013, 5:11 PM  LOS: 2 days

## 2013-12-30 NOTE — Progress Notes (Signed)
STROKE TEAM PROGRESS NOTE   HISTORY Carl Stephenson is an 68 y.o. 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 Patient said that he doesn't know why he is back to the hospital, and family is not available at this time. Thus, all clinical information is gathered from patient's chart: patient had cardiac cath 12/27/13 " and after the procedure the patient was mildly confused and it was thought secondary to medications. The patient remained confused on the second day as per the family and he was discharged home with close follow-up. After going home the patient was having difficulty coming out of the car, removing his seatbelt, feeding himself, buttoning his shirt. Patient went to kitchen to urinate. As per patient's wife before the procedure the patient was normal and did not have any confusion. Due to his mental status changes he was taken to [facility] ER. There was also report of slurred speech which is not patient's baseline". Currently, he is awake and following commands appropriately.  Serologies are unimpressive except for mildly elevated troponin. On aspirin and plavix.  Date last known well: uncertain Time last known well: uncertain tPA Given: no, out of the window   SUBJECTIVE (INTERVAL HISTORY) The patient's wife and daughter at the bedside. They feel he is back to baseline. The patient voices no complaints.  OBJECTIVE Temp:  [97.7 F (36.5 C)-98.3 F (36.8 C)] 97.7 F (36.5 C) (12/19 1021) Pulse Rate:  [48-54] 54 (12/19 1021) Cardiac Rhythm:  [-] Sinus bradycardia (12/18 1956) Resp:  [16-18] 18 (12/19 1021) BP: (90-141)/(44-70) 122/49 mmHg (12/19 1021) SpO2:  [96 %-98 %] 96 % (12/19  1021)   Recent Labs Lab 12/29/13 0742 12/29/13 1206 12/29/13 1632 12/29/13 2219 12/30/13 0822  GLUCAP 114* 166* 227* 170* 156*    Recent Labs Lab 12/28/13 0142 12/28/13 2346  NA 134* 136*  K 3.9 3.9  CL 100 99  CO2 20 23  GLUCOSE 145* 131*  BUN 15 15  CREATININE 0.81 0.98  CALCIUM 8.8 9.1  MG 1.6  --     Recent Labs Lab 12/28/13 2346  AST 15  ALT 7  ALKPHOS 56  BILITOT 1.0  PROT 6.3  ALBUMIN 3.6    Recent Labs Lab 12/28/13 0142 12/28/13 2346  WBC 10.4 7.1  HGB 13.0 12.7*  HCT 38.3* 36.6*  MCV 78.6 78.9  PLT 164 144*    Recent Labs Lab 12/29/13 0325 12/30/13 0400  TROPONINI 0.82* 0.57*    Recent Labs  12/28/13 2346  LABPROT 14.3  INR 1.10    Recent Labs  12/29/13 0040  COLORURINE YELLOW  LABSPEC 1.006  PHURINE 6.5  GLUCOSEU NEGATIVE  HGBUR MODERATE*  BILIRUBINUR NEGATIVE  KETONESUR NEGATIVE  PROTEINUR NEGATIVE  UROBILINOGEN 0.2  NITRITE NEGATIVE  LEUKOCYTESUR NEGATIVE       Component Value Date/Time   CHOL 137 12/29/2013 0325   TRIG 161* 12/29/2013 0325   HDL 37* 12/29/2013 0325   CHOLHDL 3.7 12/29/2013 0325   VLDL 32 12/29/2013 0325   LDLCALC 68 12/29/2013 0325   Lab Results  Component Value Date   HGBA1C 7.3* 12/28/2013      Component Value Date/Time   LABOPIA NONE DETECTED 12/29/2013 0040   COCAINSCRNUR NONE DETECTED 12/29/2013 0040   LABBENZ NONE DETECTED  12/29/2013 0040   AMPHETMU NONE DETECTED 12/29/2013 0040   THCU NONE DETECTED 12/29/2013 0040   LABBARB NONE DETECTED 12/29/2013 0040     Recent Labs Lab 12/28/13 2346  ETH <11    Ct Angio Head and Neck W/cm &/or Wo Cm 12/29/2013    1. No proximal branch occlusion or hemodynamically significant stenosis identified within the intracranial circulation. 2. Prominent calcified atheromatous plaque within the cavernous segments of the internal carotid arteries bilaterally. 3. Atrophy with moderate to advanced chronic microvascular ischemic disease involving  the supratentorial white matter and pons.  4. Small remote lacunar infarcts involving the bilateral basal ganglia and pons.    CTA NECK IMPRESSION:   1. No hemodynamically significant stenosis identified within the major arterial vasculature of the neck.  2. Mild to moderate calcified atheromatous disease about the carotid bifurcations and proximal internal carotid arteries bilaterally.      Mr Brain Wo Contrast 12/29/2013    Sub cm acute LEFT frontal infarct, subacute appearing tiny RIGHT inferior cerebellar infarct.   Moderate to severe white matter changes suggest chronic small vessel ischemic disease.  Remote bilateral basal ganglia and LEFT thalamus lacunar infarcts though, LEFT thalamus infarct is new from prior MRI of the brain.       Physical Exam  General: The patient is alert and cooperative at the time of the examination.  Skin: No significant peripheral edema is noted.   Neurologic Exam  Mental status: The patient is oriented x 3.  Cranial nerves: Facial symmetry is not present. Slight depression of the left nasolabial fold is noted. Speech is normal, no aphasia or dysarthria is noted. Extraocular movements are full. Visual fields are full.  Motor: The patient has good strength in all 4 extremities.  Sensory examination: Soft touch sensation is symmetric on the face, arms, and legs.  Coordination: The patient has good finger-nose-finger and heel-to-shin bilaterally.  Gait and station: The gait was not tested.  Reflexes: Deep tendon reflexes are symmetric.    ASSESSMENT/PLAN [name] is a 68 y.o. male with history of confusion following catheterization presenting with punctate infarcts of left frontal, right parietal, right cerebellum as well as several old infarcts.  He did not receive IV t-PA due to delay in presentation.   Stroke:     Resultant  confusion  MRI - as above. Multiple bilateral infarcts.  MRA - not ordered  CTA head and neck  - as above  Carotid Doppler - Bilateral - 1% to 39% ICA stenosis. Right Vertebral artery flow is antegrade however has a "thumping" Doppler signal consistent with a more distal occlusion. Left Vertebral artery flow is antegrade.  2D Echo - EF 45-50%. No cardiac source of emboli identified.  LDL 68  HgbA1c 7.3  Lovenox for VTE prophylaxis  DIET DYS 3 then liquids  aspirin 81 mg orally every day and clopidogrel 75 mg orally every day prior to admission, now on aspirin 325 mg orally every day and clopidogrel 75 mg orally every day  Patient counseled to be compliant with his antithrombotic medications  Ongoing aggressive stroke risk factor management  Therapy recommendations:  Home health physical therapy recommended  Disposition:  Pending  Hypertension  Home meds:   Lisinopril, Coreg  Patient counseled to be compliant with his blood pressure medications  Hyperlipidemia  Home meds:  Crestor, Lopid resumed in hospital  LDL 68, goal < 70  Continue statin at discharge  Diabetes  HgbA1c 7.3 goal < 7.0  Controlled  Other Stroke Risk Factors  Advanced age  Former Cigarette smoker, advised to stop smoking   Body mass index is 28.04 kg/(m^2).   Hx stroke/TIA  Coronary artery disease  Other Active Problems  Gastroesophageal reflux disease  Cancer  Should we consider TEE and loop secondary to multiple infarcts acute and remote or can this be attributed to small vessel disease and recent cardiac catheterization? - will discuss with Dr. [facility] day # [place] PA-C [facility] Pager [phone] 12/30/2013, 10:25 AM   Agree with TEE and possible loop recoreder as strokes in multiple vascular territories.  Leotis Pain       To contact Stroke Continuity provider, please refer to [email or web address] After hours, contact General Neurology

## 2013-12-31 LAB — BASIC METABOLIC PANEL
Anion gap: 15 (ref 5–15)
BUN: 28 mg/dL — ABNORMAL HIGH (ref 6–23)
CO2: 20 meq/L (ref 19–32)
Calcium: 9.5 mg/dL (ref 8.4–10.5)
Chloride: 101 mEq/L (ref 96–112)
Creatinine, Ser: 1.18 mg/dL (ref 0.50–1.35)
GFR calc Af Amer: 71 mL/min — ABNORMAL LOW (ref >90)
GFR calc non Af Amer: 61 mL/min — ABNORMAL LOW (ref >90)
GLUCOSE: 186 mg/dL — AB (ref 70–99)
Potassium: 4.4 mEq/L (ref 3.7–5.3)
SODIUM: 136 meq/L — AB (ref 137–147)

## 2013-12-31 LAB — GLUCOSE, CAPILLARY
Glucose-Capillary: 174 mg/dL — ABNORMAL HIGH (ref 70–99)
Glucose-Capillary: 218 mg/dL — ABNORMAL HIGH (ref 70–99)

## 2013-12-31 LAB — PHOSPHORUS: PHOSPHORUS: 4.3 mg/dL (ref 2.3–4.6)

## 2013-12-31 LAB — MAGNESIUM: MAGNESIUM: 2 mg/dL (ref 1.5–2.5)

## 2013-12-31 MED ORDER — CARVEDILOL 6.25 MG PO TABS: 6.2500 mg | ORAL_TABLET | Freq: Two times a day (BID) | ORAL | Status: DC

## 2013-12-31 MED ORDER — INSULIN DETEMIR 100 UNIT/ML ~~LOC~~ SOLN: 40.0000 [IU] | Freq: Every day | SUBCUTANEOUS | Status: DC

## 2013-12-31 MED ORDER — LISINOPRIL 10 MG PO TABS: 5.0000 mg | ORAL_TABLET | Freq: Every day | ORAL | Status: DC

## 2013-12-31 MED ORDER — FENOFIBRATE 160 MG PO TABS: 160.0000 mg | ORAL_TABLET | Freq: Every day | ORAL | Status: DC

## 2013-12-31 NOTE — Progress Notes (Signed)
STROKE TEAM PROGRESS NOTE   HISTORY Carl Stephenson is an 68 y.o. 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 Patient said that he doesn't know why he is back to the hospital, and family is not available at this time. Thus, all clinical information is gathered from patient's chart: patient had cardiac cath 12/27/13 " and after the procedure the patient was mildly confused and it was thought secondary to medications. The patient remained confused on the second day as per the family and he was discharged home with close follow-up. After going home the patient was having difficulty coming out of the car, removing his seatbelt, feeding himself, buttoning his shirt. Patient went to kitchen to urinate. As per patient's wife before the procedure the patient was normal and did not have any confusion. Due to his mental status changes he was taken to [facility] ER. There was also report of slurred speech which is not patient's baseline". Currently, he is awake and following commands appropriately.  Serologies are unimpressive except for mildly elevated troponin. On aspirin and plavix.  Date last known well: uncertain Time last known well: uncertain tPA Given: no, out of the window   SUBJECTIVE (INTERVAL HISTORY) The patient's wife and daughter at the bedside. They feel he is back to baseline. The patient voices no complaints.  OBJECTIVE Temp:  [97.3 F (36.3 C)-98.9 F (37.2 C)] 98.2 F (36.8 C) (12/20 0510) Pulse Rate:  [48-54] 51 (12/20 0510) Cardiac Rhythm:  [-] Sinus bradycardia (12/20 0705) Resp:  [18-22] 18 (12/20 0510) BP: (87-122)/(45-64) 119/53 mmHg (12/20 0917) SpO2:  [96 %-98 %] 97 % (12/20  0510)   Recent Labs Lab 12/30/13 0822 12/30/13 1200 12/30/13 1738 12/30/13 2201 12/31/13 0750  GLUCAP 156* 202* 189* 247* 174*    Recent Labs Lab 12/28/13 0142 12/28/13 2346 12/31/13 0524  NA 134* 136* 136*  K 3.9 3.9 4.4  CL 100 99 101  CO2 20 23 20   GLUCOSE 145* 131* 186*  BUN 15 15 28*  CREATININE 0.81 0.98 1.18  CALCIUM 8.8 9.1 9.5  MG 1.6  --  2.0  PHOS  --   --  4.3    Recent Labs Lab 12/28/13 2346  AST 15  ALT 7  ALKPHOS 56  BILITOT 1.0  PROT 6.3  ALBUMIN 3.6    Recent Labs Lab 12/28/13 0142 12/28/13 2346  WBC 10.4 7.1  HGB 13.0 12.7*  HCT 38.3* 36.6*  MCV 78.6 78.9  PLT 164 144*    Recent Labs Lab 12/29/13 0325 12/30/13 0400  TROPONINI 0.82* 0.57*    Recent Labs  12/28/13 2346  LABPROT 14.3  INR 1.10    Recent Labs  12/29/13 0040  COLORURINE YELLOW  LABSPEC 1.006  PHURINE 6.5  GLUCOSEU NEGATIVE  HGBUR MODERATE*  BILIRUBINUR NEGATIVE  KETONESUR NEGATIVE  PROTEINUR NEGATIVE  UROBILINOGEN 0.2  NITRITE NEGATIVE  LEUKOCYTESUR NEGATIVE       Component Value Date/Time   CHOL 137 12/29/2013 0325   TRIG 161* 12/29/2013 0325   HDL 37* 12/29/2013 0325   CHOLHDL 3.7 12/29/2013 0325   VLDL 32 12/29/2013 0325   LDLCALC 68 12/29/2013 0325   Lab Results  Component Value Date   HGBA1C 7.3* 12/28/2013      Component Value  Date/Time   LABOPIA NONE DETECTED 12/29/2013 0040   COCAINSCRNUR NONE DETECTED 12/29/2013 0040   LABBENZ NONE DETECTED 12/29/2013 0040   AMPHETMU NONE DETECTED 12/29/2013 0040   THCU NONE DETECTED 12/29/2013 0040   LABBARB NONE DETECTED 12/29/2013 0040     Recent Labs Lab 12/28/13 2346  ETH <11    Ct Angio Head and Neck W/cm &/or Wo Cm 12/29/2013    1. No proximal branch occlusion or hemodynamically significant stenosis identified within the intracranial circulation. 2. Prominent calcified atheromatous plaque within the cavernous segments of the internal carotid arteries bilaterally. 3. Atrophy  with moderate to advanced chronic microvascular ischemic disease involving the supratentorial white matter and pons.  4. Small remote lacunar infarcts involving the bilateral basal ganglia and pons.    CTA NECK IMPRESSION:   1. No hemodynamically significant stenosis identified within the major arterial vasculature of the neck.  2. Mild to moderate calcified atheromatous disease about the carotid bifurcations and proximal internal carotid arteries bilaterally.      Mr Brain Wo Contrast 12/29/2013    Sub cm acute LEFT frontal infarct, subacute appearing tiny RIGHT inferior cerebellar infarct.   Moderate to severe white matter changes suggest chronic small vessel ischemic disease.  Remote bilateral basal ganglia and LEFT thalamus lacunar infarcts though, LEFT thalamus infarct is new from prior MRI of the brain.       Physical Exam  General: The patient is alert and cooperative at the time of the examination.  Skin: No significant peripheral edema is noted.   Neurologic Exam  Mental status: The patient is oriented x 3.  Cranial nerves: Facial symmetry is not present. Slight depression of the left nasolabial fold is noted. Speech is normal, no aphasia or dysarthria is noted. Extraocular movements are full. Visual fields are full.  Motor: The patient has good strength in all 4 extremities.  Sensory examination: Soft touch sensation is symmetric on the face, arms, and legs.  Coordination: The patient has good finger-nose-finger and heel-to-shin bilaterally.  Gait and station: The gait was not tested.  Reflexes: Deep tendon reflexes are symmetric.    ASSESSMENT/PLAN [name] is a 68 y.o. male with history of confusion following catheterization presenting with punctate infarcts of left frontal, right parietal, right cerebellum as well as several old infarcts.  He did not receive IV t-PA due to delay in presentation.   Stroke:     Resultant  confusion  MRI - as  above. Multiple bilateral infarcts.  MRA - not ordered  CTA head and neck - as above  Carotid Doppler - Bilateral - 1% to 39% ICA stenosis. Right Vertebral artery flow is antegrade however has a "thumping" Doppler signal consistent with a more distal occlusion. Left Vertebral artery flow is antegrade.  2D Echo - EF 45-50%. No cardiac source of emboli identified.  LDL 68  HgbA1c 7.3  Lovenox for VTE prophylaxis  DIET DYS 3 then liquids  aspirin 81 mg orally every day and clopidogrel 75 mg orally every day prior to admission, now on aspirin 325 mg orally every day and clopidogrel 75 mg orally every day  Patient counseled to be compliant with his antithrombotic medications  Ongoing aggressive stroke risk factor management  Therapy recommendations:  Home health physical therapy recommended  Disposition:  Pending  Hypertension  Home meds:   Lisinopril, Coreg  Patient counseled to be compliant with his blood pressure medications  Hyperlipidemia  Home meds:  Crestor, Lopid resumed in hospital  LDL  68, goal < 70  Continue statin at discharge  Diabetes  HgbA1c 7.3 goal < 7.0  Controlled  Other Stroke Risk Factors  Advanced age  Former Cigarette smoker, advised to stop smoking   Body mass index is 28.04 kg/(m^2).   Hx stroke/TIA  Coronary artery disease  Other Active Problems  Gastroesophageal reflux disease  Cancer  Should we consider TEE and loop secondary to multiple infarcts acute and remote or can this be attributed to small vessel disease and recent cardiac catheterization? - will discuss with Dr. [facility] day # [place] PA-C [facility] Pager [phone] 12/31/2013, 10:16 AM   Agree with TEE and possible loop recoreder as strokes in multiple vascular territories.        To contact Stroke Continuity provider, please refer to [email or web address] After hours, contact General Neurology

## 2013-12-31 NOTE — Discharge Summary (Signed)
Physician Discharge Summary  Carl Stephenson IWL:[id number] DOB: 1945-10-29 DOA: 12/28/2013  PCP: No PCP Per Patient  Admit date: 12/28/2013 Discharge date: 12/31/2013  Time spent: >30 minutes  Recommendations for Outpatient Follow-up:  Close follow up to CBG's diabetes; adjust insulin therapy as needed Check BMET to follow electrolytes and renal function Reassess BP and adjust medications as needed  Discharge Diagnoses:  Principal Problem:   Acute CVA (cerebrovascular accident) Active Problems:   Hyperlipidemia   CAD, NATIVE VESSEL   GERD   Diabetes mellitus   CVA (cerebral infarction)   Acute encephalopathy   Elevated troponin   Discharge Condition: stable and improved. Will discharge on plavix and ASA. Follow up with PCP in 10 days and with cardiology as an outpatient. 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 He had a cardiac cath on 12/16 during which he had a DES placed to the proximal LAD. Per the records he has been confused since his procedure with elevated bp and he was monitored overnight and discharged on 12/17. At [facility] he was found to have an elevated troponin (0.97). MR brain shows small left frontal infarct and small right inferior cerebellar infarct.  Hospital Course:  Acute small bilateral CVAs with a history of stroke and recent cath (that most likely lead to acute stroke) Neurology consulted.  On aspirin 325 and plavix daily (aspirin increased from 81 mg) On tricor and Crestor.  TSH WNL, U/A neg, Tox  screen neg.  Carotid duplex with mild ICD stenosis SLP / PT / OT evals ordered. Recommendations are for [facility] services 2-D echo w/o thrombi  CAD with elevated troponin after Cath (12/16) Continue medical therapy. Most recent T1 = 0.5 Cardiology consulted with the patient and family. Elevated troponin not unexpected after cath. Outpatient follow up will be scheduled by cards.  Dyslipidemia -Triglycerides are 161.  -will continue crestor and tricor now -Advise to follow low fat diet  DM -Normally on 65 units of levemir at home plus metformin.  -A1C 7.3. 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 Wall thickness was increased in a pattern of mild LVH. Systolic function was mildly reduced. The estimated ejection fraction was in the range of 45% to 50%. There is mild hypokinesis of the basal-midinferior myocardium. Doppler parameters are consistent with abnormal left ventricular relaxation (grade 1 diastolic dysfunction).   Carotid duplex: Bilateral - 1% to 39% ICA stenosis. Right vertebral Doppler signal is antegrade and thumping consistent with a possible more distal occlusion. Left vertebral artery flow is antegrade.   See below for x-ray reports  Consultations:  Neurology  Cardiology   Discharge Exam: Filed Vitals:  12/31/13 0917  BP: 119/53  Pulse:   Temp:   Resp:    General: Well developed, well nourished, NAD, appears stated age  Cardiovascular: RRR, S1 S2 auscultated, no rubs, murmurs or gallops.  Respiratory: Clear to  auscultation bilaterally  Abdomen: Soft, nontender, nondistended, + bowel sounds  Extremities: warm dry without cyanosis clubbing or edema. Neuro: Awake, Alert, Smile is now symetric, improvement in Left sided weakness and is able to close his left eye.    Discharge Instructions You were cared for by a hospitalist during your hospital stay. If you have any questions about your discharge medications or the care you received while you were in the hospital after you are discharged, you can call the unit and asked to speak with the hospitalist on call if the hospitalist that took care of you is not available. Once you are discharged, your primary care physician will handle any further medical issues. Please note that NO REFILLS for any discharge medications will be authorized once you are discharged, as it is imperative that you return to your primary care physician (or establish a relationship with a primary care physician if you do not have one) for your aftercare needs so that they can reassess your need for medications and monitor your lab values.  Discharge Instructions    Diet - low sodium heart healthy    Complete by:  As directed      Discharge instructions    Complete by:  As directed   Take medications as prescribed Follow a low sodium, low carbohydrates and low fat diet Maintain yourself well hydrated Call Cardiology office for details on follow up appointment          Current Discharge Medication List    START taking these medications   Details  fenofibrate 160 MG tablet Take 1 tablet (160 mg total) by mouth daily. Qty: 30 tablet, Refills: 1      CONTINUE these medications which have CHANGED   Details  carvedilol (COREG) 6.25 MG tablet Take 1 tablet (6.25 mg total) by mouth 2 (two) times daily with a meal. Qty: 60 tablet, Refills: 1    insulin detemir (LEVEMIR) 100 UNIT/ML injection Inject 0.4 mLs (40 Units total) into the skin daily with supper. Qty: 10 mL, Refills: 11     lisinopril (PRINIVIL,ZESTRIL) 10 MG tablet Take 0.5 tablets (5 mg total) by mouth daily.      CONTINUE these medications which have NOT CHANGED   Details  aspirin EC 81 MG tablet Take 81 mg by mouth daily.    Carboxymethylcellulose Sodium 0.25 % SOLN Place 1 drop into both eyes 5 (five) times daily.    clopidogrel (PLAVIX) 75 MG tablet Take 1 tablet (75 mg total) by mouth daily. Qty: 30 tablet, Refills: 11    ezetimibe (ZETIA) 10 MG tablet Take 10 mg by mouth at bedtime.    fexofenadine (ALLEGRA) 180 MG tablet Take 180 mg by mouth daily.    insulin aspart (NOVOLOG FLEXPEN) 100 UNIT/ML FlexPen Inject 10 Units into the skin daily before supper.    isosorbide mononitrate (IMDUR) 30 MG 24 hr tablet Take 30 mg by mouth daily.      nitroGLYCERIN (NITROSTAT) 0.4 MG SL tablet Place 1 tablet (0.4 mg total) under the tongue every 5 (five) minutes as needed. May repeat for up to 3 doses. Qty: 25 tablet, Refills: 3   Associated Diagnoses: Coronary artery disease involving native coronary artery with other forms of angina pectoris; Essential hypertension, benign;  Hyperlipidemia    oxazepam (SERAX) 10 MG capsule Take 10 mg by mouth 3 (three) times daily as needed for anxiety.     pantoprazole (PROTONIX) 40 MG tablet Take 40 mg by mouth daily.      PARoxetine (PAXIL) 30 MG tablet Take 30 mg by mouth daily.     !! PRESCRIPTION MEDICATION Place 1 drop into both eyes 5 (five) times daily. Carboxymethylcellulose 0.25% solution    !! PRESCRIPTION MEDICATION Place 1 drop into the left eye at bedtime. Carboxymethylcellulose 1% opth gel    solifenacin (VESICARE) 5 MG tablet Take 5 mg by mouth daily.    triamcinolone (KENALOG) 0.1 % paste Use as directed 1 application in the mouth or throat 2 (two) times daily as needed (wound care (mouth)).    zolpidem (AMBIEN) 10 MG tablet Take 10 mg by mouth at bedtime.    metFORMIN (GLUCOPHAGE) 850 MG tablet Take 850 mg by mouth 3 (three) times daily.       rosuvastatin (CRESTOR) 10 MG tablet Take 1 tablet (10 mg total) by mouth daily at 6 PM. Qty: 30 tablet, Refills: 5     !! - Potential duplicate medications found. Please discuss with provider.    STOP taking these medications     gemfibrozil (LOPID) 600 MG tablet        Allergies  Allergen Reactions  . Nitrofurantoin Monohyd Macro Hives and Rash   Follow-up Information    Follow up with [facility].   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    Significant Diagnostic Studies: Ct Angio Head W/cm &/or Wo Cm  12/29/2013   CLINICAL DATA:  Initial evaluation for stroke. Recent cardiac catheterization with stent placement. Subsequently developed confusion. Found have small ischemic infarcts on prior brain MRI. History of multiple TIAs.  EXAM: CT ANGIOGRAPHY HEAD AND NECK  TECHNIQUE: Multidetector CT imaging of the head and neck was performed using the standard protocol during bolus administration of intravenous contrast. Multiplanar CT image reconstructions and MIPs were obtained to evaluate the vascular anatomy. Carotid stenosis measurements (when applicable) are obtained utilizing NASCET criteria, using the distal internal carotid diameter as the denominator.  CONTRAST:  51mL OMNIPAQUE IOHEXOL 350 MG/ML SOLN  COMPARISON:  Prior MRI performed earlier on the same day.  FINDINGS: CT HEAD WITHOUT CONTRAST:  Precontrast imaging of the brain demonstrates global atrophy with moderate chronic microvascular ischemic disease involving the supratentorial white matter. Small vessel type changes present within the pons as well. Remote lacunar infarct present within the left pons. Remote basal ganglia infarcts again  noted, better evaluated on prior MRI.  Previously identified tiny ischemic infarcts in the left frontal and right cerebellar regions not seen on this exam. No acute large vessel territory infarct. No intracranial hemorrhage. No extra-axial fluid collection. Ventricles normal size without evidence of hydrocephalus.  Scalp soft tissues within normal limits. Calvarium intact. No acute abnormality seen about the orbits.  Paranasal sinuses and mastoid air cells are clear.  CTA HEAD FINDINGS  ANTERIOR CIRCULATION:  The petrous segments of the internal carotid arteries are widely patent bilaterally. Prominent atheromatous plaque present within the cavernous carotid arteries but without resultant hemodynamically significant stenosis. Supra clinoid segments well opacified.  M1 segments well opacified  bilaterally without proximal branch occlusion or hemodynamically significant stenosis. MCA bifurcations within normal limits. Distal MCA branches well opacified and symmetric bilaterally. No M2 or in 3 branch occlusion appreciated.  A1 segments, anterior communicating artery, and anterior cerebral arteries within normal limits.  POSTERIOR CIRCULATION:  Vertebral arteries are codominant and well opacified to the level of the vertebrobasilar junction without significant stenosis. Minimal atherosclerotic plaque present within the left V4 segment. Posterior inferior cerebral arteries well opacified bilaterally. Basilar artery widely patent. No basilar tip stenosis or aneurysm. Posterior cerebral arteries well opacified bilaterally. Minimal multi focal atherosclerotic irregularity noted within the P2 segments bilaterally. Superior cerebral arteries appear to arise off the P1 segments. The superior cerebellar arteries and cells are well opacified. Anterior inferior cerebral arteries not well visualized on this exam.  No aneurysm or vascular malformation within the intracranial circulation.  CTA NECK FINDINGS:  The visualized aortic  arch is of normal caliber with normal 3 vessel morphology. No high-grade stenosis seen at the origin of the great vessels. Visualized subclavian arteries widely patent. Scattered atheromatous plaque present within the proximal left subclavian artery without significant stenosis.  Common carotid arteries mildly tortuous proximally but widely patent. Scattered calcified plaque present about the carotid bifurcations without hemodynamically significant stenosis. Plaque extends into the proximal aspects of the internal carotid arteries bilaterally. No significant stenosis. No other acute abnormality such as dissection or vascular occlusion. No penetrating atherosclerotic plaques appreciated.  External carotid arteries and their branches are within normal limits.  Both vertebral arteries arise from the subclavian arteries. Vertebral arteries appear to be codominant. Vertebral arteries are well opacified to the level of the skullbase without significant stenosis, dissection, or occlusion.  Paraseptal emphysematous changes present within the visualized lungs. Thyroid gland within normal limits. No adenopathy. No soft tissue mass or loculated collection identified within the neck. Airway widely patent and midline. Paranasal sinuses are clear.  Moderate multilevel degenerative disc disease seen within the cervical spine, most severe at C5-6. No acute osseus abnormality. No worrisome lytic or blastic osseous lesions.  IMPRESSION: CTA HEAD IMPRESSION:  1. No proximal branch occlusion or hemodynamically significant stenosis identified within the intracranial circulation. 2. Prominent calcified atheromatous plaque within the cavernous segments of the internal carotid arteries bilaterally. 3. Atrophy with moderate to advanced chronic microvascular ischemic disease involving the supratentorial white matter and pons. 4. Small remote lacunar infarcts involving the bilateral basal ganglia and pons.  CTA NECK IMPRESSION:  1. No  hemodynamically significant stenosis identified within the major arterial vasculature of the neck. 2. Mild to moderate calcified atheromatous disease about the carotid bifurcations and proximal internal carotid arteries bilaterally.   Electronically Signed   By: Jeannine Boga M.D.   On: 12/29/2013 22:47   Ct Angio Neck W/cm &/or Wo/cm  12/29/2013   CLINICAL DATA:  Initial evaluation for stroke. Recent cardiac catheterization with stent placement. Subsequently developed confusion. Found have small ischemic infarcts on prior brain MRI. History of multiple TIAs.  EXAM: CT ANGIOGRAPHY HEAD AND NECK  TECHNIQUE: Multidetector CT imaging of the head and neck was performed using the standard protocol during bolus administration of intravenous contrast. Multiplanar CT image reconstructions and MIPs were obtained to evaluate the vascular anatomy. Carotid stenosis measurements (when applicable) are obtained utilizing NASCET criteria, using the distal internal carotid diameter as the denominator.  CONTRAST:  87mL OMNIPAQUE IOHEXOL 350 MG/ML SOLN  COMPARISON:  Prior MRI performed earlier on the same day.  FINDINGS: CT HEAD WITHOUT CONTRAST:  Precontrast imaging of  the brain demonstrates global atrophy with moderate chronic microvascular ischemic disease involving the supratentorial white matter. Small vessel type changes present within the pons as well. Remote lacunar infarct present within the left pons. Remote basal ganglia infarcts again noted, better evaluated on prior MRI.  Previously identified tiny ischemic infarcts in the left frontal and right cerebellar regions not seen on this exam. No acute large vessel territory infarct. No intracranial hemorrhage. No extra-axial fluid collection. Ventricles normal size without evidence of hydrocephalus.  Scalp soft tissues within normal limits. Calvarium intact. No acute abnormality seen about the orbits.  Paranasal sinuses and mastoid air cells are clear.  CTA HEAD  FINDINGS  ANTERIOR CIRCULATION:  The petrous segments of the internal carotid arteries are widely patent bilaterally. Prominent atheromatous plaque present within the cavernous carotid arteries but without resultant hemodynamically significant stenosis. Supra clinoid segments well opacified.  M1 segments well opacified bilaterally without proximal branch occlusion or hemodynamically significant stenosis. MCA bifurcations within normal limits. Distal MCA branches well opacified and symmetric bilaterally. No M2 or in 3 branch occlusion appreciated.  A1 segments, anterior communicating artery, and anterior cerebral arteries within normal limits.  POSTERIOR CIRCULATION:  Vertebral arteries are codominant and well opacified to the level of the vertebrobasilar junction without significant stenosis. Minimal atherosclerotic plaque present within the left V4 segment. Posterior inferior cerebral arteries well opacified bilaterally. Basilar artery widely patent. No basilar tip stenosis or aneurysm. Posterior cerebral arteries well opacified bilaterally. Minimal multi focal atherosclerotic irregularity noted within the P2 segments bilaterally. Superior cerebral arteries appear to arise off the P1 segments. The superior cerebellar arteries and cells are well opacified. Anterior inferior cerebral arteries not well visualized on this exam.  No aneurysm or vascular malformation within the intracranial circulation.  CTA NECK FINDINGS:  The visualized aortic arch is of normal caliber with normal 3 vessel morphology. No high-grade stenosis seen at the origin of the great vessels. Visualized subclavian arteries widely patent. Scattered atheromatous plaque present within the proximal left subclavian artery without significant stenosis.  Common carotid arteries mildly tortuous proximally but widely patent. Scattered calcified plaque present about the carotid bifurcations without hemodynamically significant stenosis. Plaque extends into  the proximal aspects of the internal carotid arteries bilaterally. No significant stenosis. No other acute abnormality such as dissection or vascular occlusion. No penetrating atherosclerotic plaques appreciated.  External carotid arteries and their branches are within normal limits.  Both vertebral arteries arise from the subclavian arteries. Vertebral arteries appear to be codominant. Vertebral arteries are well opacified to the level of the skullbase without significant stenosis, dissection, or occlusion.  Paraseptal emphysematous changes present within the visualized lungs. Thyroid gland within normal limits. No adenopathy. No soft tissue mass or loculated collection identified within the neck. Airway widely patent and midline. Paranasal sinuses are clear.  Moderate multilevel degenerative disc disease seen within the cervical spine, most severe at C5-6. No acute osseus abnormality. No worrisome lytic or blastic osseous lesions.  IMPRESSION: CTA HEAD IMPRESSION:  1. No proximal branch occlusion or hemodynamically significant stenosis identified within the intracranial circulation. 2. Prominent calcified atheromatous plaque within the cavernous segments of the internal carotid arteries bilaterally. 3. Atrophy with moderate to advanced chronic microvascular ischemic disease involving the supratentorial white matter and pons. 4. Small remote lacunar infarcts involving the bilateral basal ganglia and pons.  CTA NECK IMPRESSION:  1. No hemodynamically significant stenosis identified within the major arterial vasculature of the neck. 2. Mild to moderate calcified atheromatous disease about the carotid bifurcations  and proximal internal carotid arteries bilaterally.   Electronically Signed   By: Jeannine Boga M.D.   On: 12/29/2013 22:47   Mr Brain Wo Contrast  12/29/2013   CLINICAL DATA:  Cardiac catheterization December 27, 2013, mild confusion after procedure. Uncoordinated.  EXAM: MRI HEAD WITHOUT  CONTRAST  TECHNIQUE: Multiplanar, multiecho pulse sequences of the brain and surrounding structures were obtained without intravenous contrast.  COMPARISON:  CT of the head December 28, 2013 and MRI of the brain August 22, 2010  FINDINGS: Subcentimeter focus of reduced diffusion within RIGHT inferior cerebellum, and no corresponding ADC abnormality. Subcentimeter focus of reduced diffusion within LEFT frontal lobe, axial 26/90.  Ventricles zone sulci are normal for patient's age. Patchy to confluent supratentorial white matter FLAIR T2 hyperintensities. Patchy pontine T2 hyperintensities, progressed from prior imaging. Remote bilateral basal ganglia and LEFT thalamus lacunar infarct, though the LEFT thalamus infarct is new from prior imaging. No midline shift or mass effect. No susceptibility artifact to suggest hemorrhage.  No abnormal extra-axial fluid collections. Dolicoectatic appearing intracranial vessels can be seen with chronic hypertension.  Paranasal sinuses and mastoid air cells appear well-aerated. Ocular globes and orbital contents are unremarkable. No sellar expansion. No cerebellar tonsillar ectopia. No suspicious calvarial bone marrow signal.  IMPRESSION: Sub cm acute LEFT frontal infarct, subacute appearing tiny RIGHT inferior cerebellar infarct.  Moderate to severe white matter changes suggest chronic small vessel ischemic disease. Remote bilateral basal ganglia and LEFT thalamus lacunar infarcts though, LEFT thalamus infarct is new from prior MRI of the brain.   Electronically Signed   By: Elon Alas   On: 12/29/2013 04:52   Labs: Basic Metabolic Panel:  Recent Labs Lab 12/28/13 0142 12/28/13 2346 12/31/13 0524  NA 134* 136* 136*  K 3.9 3.9 4.4  CL 100 99 101  CO2 20 23 20   GLUCOSE 145* 131* 186*  BUN 15 15 28*  CREATININE 0.81 0.98 1.18  CALCIUM 8.8 9.1 9.5  MG 1.6  --  2.0  PHOS  --   --  4.3   Liver Function Tests:  Recent Labs Lab 12/28/13 2346  AST 15  ALT 7   ALKPHOS 56  BILITOT 1.0  PROT 6.3  ALBUMIN 3.6    Recent Labs Lab 12/28/13 2345  AMMONIA 10*   CBC:  Recent Labs Lab 12/28/13 0142 12/28/13 2346  WBC 10.4 7.1  HGB 13.0 12.7*  HCT 38.3* 36.6*  MCV 78.6 78.9  PLT 164 144*   Cardiac Enzymes:  Recent Labs Lab 12/29/13 0325 12/30/13 0400  TROPONINI 0.82* 0.57*   CBG:  Recent Labs Lab 12/30/13 1200 12/30/13 1738 12/30/13 2201 12/31/13 0750 12/31/13 1144  GLUCAP 202* 189* 247* 174* 218*    Signed:  Barton Dubois  [facility] 12/31/2013, 1:18 PM

## 2013-12-31 NOTE — Care Management Note (Signed)
    Page 1 of 1   12/31/2013     4:55:26 PM CARE MANAGEMENT NOTE 12/31/2013  Patient:  AIVAN, FILLINGIM   Account Number:  [id number]  Date Initiated:  12/31/2013  Documentation initiated by:  [facility]  Subjective/Objective Assessment:   adm: Acute CVA (cerebrovascular accident)     Action/Plan:   discharge planning   Anticipated DC Date:  12/31/2013   Anticipated DC Plan:  [place]  CM consult      [facility] Choice  HOME HEALTH   Choice offered to / List presented to:  C-1 Patient        [place] arranged  HH-1 RN  [place].   Status of service:  Completed, signed off Medicare Important Message given?   (If response is "NO", the following Medicare IM given date fields will be blank) Date Medicare IM given:   Medicare IM given by:   Date Additional Medicare IM given:   Additional Medicare IM given by:    Discharge Disposition:  [facility]  Per UR Regulation:    If discussed at Long Length of Stay Meetings, dates discussed:    Comments:  12/31/13 15:00 Cm met with pt in room to offer choice of home health agency.  Pt chooses [facility] to render HHPT/OT/RN. Address and contact informaiton verified with pt. referral called to [facility] rep, Miranda.  No other CM needs were comunicated.  Mariane Masters, BSN, Cm [phone].

## 2013-12-31 NOTE — Progress Notes (Signed)
Nsg Discharge Note  Admit Date:  12/28/2013 Discharge date: 12/31/2013   Husayn Reim Henrichs to be D/C'd Home with home health per MD order.  AVS completed.  Copy for chart, and copy for patient signed, and dated. Patient/caregiver able to verbalize understanding.  Discharge Medication:   Medication List    STOP taking these medications        LOPID 600 MG tablet  Generic drug:  gemfibrozil      TAKE these medications        aspirin EC 81 MG tablet  Take 81 mg by mouth daily.     Carboxymethylcellulose Sodium 0.25 % Soln  Place 1 drop into both eyes 5 (five) times daily.     carvedilol 6.25 MG tablet  Commonly known as:  COREG  Take 1 tablet (6.25 mg total) by mouth 2 (two) times daily with a meal.     clopidogrel 75 MG tablet  Commonly known as:  PLAVIX  Take 1 tablet (75 mg total) by mouth daily.     ezetimibe 10 MG tablet  Commonly known as:  ZETIA  Take 10 mg by mouth at bedtime.     fenofibrate 160 MG tablet  Take 1 tablet (160 mg total) by mouth daily.     fexofenadine 180 MG tablet  Commonly known as:  ALLEGRA  Take 180 mg by mouth daily.     insulin detemir 100 UNIT/ML injection  Commonly known as:  LEVEMIR  Inject 0.4 mLs (40 Units total) into the skin daily with supper.     isosorbide mononitrate 30 MG 24 hr tablet  Commonly known as:  IMDUR  Take 30 mg by mouth daily.     lisinopril 10 MG tablet  Commonly known as:  PRINIVIL,ZESTRIL  Take 0.5 tablets (5 mg total) by mouth daily.     metFORMIN 850 MG tablet  Commonly known as:  GLUCOPHAGE  Take 850 mg by mouth 3 (three) times daily.     nitroGLYCERIN 0.4 MG SL tablet  Commonly known as:  NITROSTAT  Place 1 tablet (0.4 mg total) under the tongue every 5 (five) minutes as needed. May repeat for up to 3 doses.     NOVOLOG FLEXPEN 100 UNIT/ML FlexPen  Generic drug:  insulin aspart  Inject 10 Units into the skin daily before supper.     oxazepam 10 MG capsule  Commonly known as:  SERAX  Take  10 mg by mouth 3 (three) times daily as needed for anxiety.     pantoprazole 40 MG tablet  Commonly known as:  PROTONIX  Take 40 mg by mouth daily.     PARoxetine 30 MG tablet  Commonly known as:  PAXIL  Take 30 mg by mouth daily.     PRESCRIPTION MEDICATION  Place 1 drop into both eyes 5 (five) times daily. Carboxymethylcellulose 0.25% solution     PRESCRIPTION MEDICATION  Place 1 drop into the left eye at bedtime. Carboxymethylcellulose 1% opth gel     rosuvastatin 10 MG tablet  Commonly known as:  CRESTOR  Take 1 tablet (10 mg total) by mouth daily at 6 PM.     solifenacin 5 MG tablet  Commonly known as:  VESICARE  Take 5 mg by mouth daily.     triamcinolone 0.1 % paste  Commonly known as:  KENALOG  Use as directed 1 application in the mouth or throat 2 (two) times daily as needed (wound care (mouth)).     zolpidem 10 MG  tablet  Commonly known as:  AMBIEN  Take 10 mg by mouth at bedtime.        Discharge Assessment: Filed Vitals:   12/31/13 0917  BP: 119/53  Pulse:   Temp:   Resp:    Skin clean, dry and intact, may refer to [facility].  IV catheter discontinued intact. Site without signs and symptoms of complications - no redness or edema noted at insertion site, patient denies c/o pain - only slight tenderness at site.  Dressing with slight pressure applied.  D/c Instructions-Education: Discharge instructions given to patient/family with verbalized understanding. D/c education completed with patient/family including follow up instructions, medication list, d/c activities limitations if indicated, with other d/c instructions as indicated by MD - patient able to verbalize understanding, all questions fully answered. Patient instructed to return to ED, call 911, or call MD for any changes in condition.  Patient escorted via [place], and D/C home via private auto.  Dayle Points, RN 12/31/2013 2:31 PM

## 2014-01-08 DIAGNOSIS — K219 Gastro-esophageal reflux disease without esophagitis: Secondary | ICD-10-CM | POA: Diagnosis not present

## 2014-01-08 DIAGNOSIS — Z8673 Personal history of transient ischemic attack (TIA), and cerebral infarction without residual deficits: Secondary | ICD-10-CM | POA: Diagnosis not present

## 2014-01-08 DIAGNOSIS — E669 Obesity, unspecified: Secondary | ICD-10-CM | POA: Diagnosis not present

## 2014-01-08 DIAGNOSIS — I2510 Atherosclerotic heart disease of native coronary artery without angina pectoris: Secondary | ICD-10-CM | POA: Diagnosis not present

## 2014-01-08 DIAGNOSIS — E785 Hyperlipidemia, unspecified: Secondary | ICD-10-CM | POA: Diagnosis not present

## 2014-01-08 DIAGNOSIS — Z87891 Personal history of nicotine dependence: Secondary | ICD-10-CM | POA: Diagnosis not present

## 2014-01-08 DIAGNOSIS — E119 Type 2 diabetes mellitus without complications: Secondary | ICD-10-CM | POA: Diagnosis not present

## 2014-01-08 DIAGNOSIS — Z794 Long term (current) use of insulin: Secondary | ICD-10-CM | POA: Diagnosis not present

## 2014-01-10 DIAGNOSIS — E119 Type 2 diabetes mellitus without complications: Secondary | ICD-10-CM | POA: Diagnosis not present

## 2014-01-10 DIAGNOSIS — I2510 Atherosclerotic heart disease of native coronary artery without angina pectoris: Secondary | ICD-10-CM | POA: Diagnosis not present

## 2014-01-10 DIAGNOSIS — E785 Hyperlipidemia, unspecified: Secondary | ICD-10-CM | POA: Diagnosis not present

## 2014-01-10 DIAGNOSIS — Z8673 Personal history of transient ischemic attack (TIA), and cerebral infarction without residual deficits: Secondary | ICD-10-CM | POA: Diagnosis not present

## 2014-01-10 DIAGNOSIS — K219 Gastro-esophageal reflux disease without esophagitis: Secondary | ICD-10-CM | POA: Diagnosis not present

## 2014-01-10 DIAGNOSIS — E669 Obesity, unspecified: Secondary | ICD-10-CM | POA: Diagnosis not present

## 2014-01-11 DIAGNOSIS — E119 Type 2 diabetes mellitus without complications: Secondary | ICD-10-CM | POA: Diagnosis not present

## 2014-01-11 DIAGNOSIS — I2510 Atherosclerotic heart disease of native coronary artery without angina pectoris: Secondary | ICD-10-CM | POA: Diagnosis not present

## 2014-01-11 DIAGNOSIS — E669 Obesity, unspecified: Secondary | ICD-10-CM | POA: Diagnosis not present

## 2014-01-11 DIAGNOSIS — E785 Hyperlipidemia, unspecified: Secondary | ICD-10-CM | POA: Diagnosis not present

## 2014-01-11 DIAGNOSIS — Z8673 Personal history of transient ischemic attack (TIA), and cerebral infarction without residual deficits: Secondary | ICD-10-CM | POA: Diagnosis not present

## 2014-01-11 DIAGNOSIS — K219 Gastro-esophageal reflux disease without esophagitis: Secondary | ICD-10-CM | POA: Diagnosis not present

## 2014-01-16 ENCOUNTER — Encounter: Payer: Self-pay | Admitting: Physician Assistant

## 2014-01-16 ENCOUNTER — Ambulatory Visit (INDEPENDENT_AMBULATORY_CARE_PROVIDER_SITE_OTHER): Payer: Medicare Other | Admitting: Physician Assistant

## 2014-01-16 VITALS — BP 80/50 | HR 52 | Ht 67.0 in | Wt 185.1 lb

## 2014-01-16 DIAGNOSIS — I2510 Atherosclerotic heart disease of native coronary artery without angina pectoris: Secondary | ICD-10-CM

## 2014-01-16 DIAGNOSIS — I952 Hypotension due to drugs: Secondary | ICD-10-CM

## 2014-01-16 DIAGNOSIS — I959 Hypotension, unspecified: Secondary | ICD-10-CM | POA: Insufficient documentation

## 2014-01-16 DIAGNOSIS — I639 Cerebral infarction, unspecified: Secondary | ICD-10-CM

## 2014-01-16 DIAGNOSIS — I10 Essential (primary) hypertension: Secondary | ICD-10-CM

## 2014-01-16 MED ORDER — ASPIRIN EC 325 MG PO TBEC: 325.0000 mg | DELAYED_RELEASE_TABLET | Freq: Every day | ORAL | Status: DC

## 2014-01-16 NOTE — Assessment & Plan Note (Signed)
Patient is hypotensive in the office today. He is not eating or drinking enough. Will hold Imdur and lisinopril until he is feeling better and his blood pressure comes up. His wife keeps a close eye on his blood pressure. Follow-up with Dr. Burt Knack.

## 2014-01-16 NOTE — Assessment & Plan Note (Signed)
Patient has recovered from his recent CVA. He has no significant residual symptoms. Increase aspirin to 325 mg daily per neurology. Continue Plavix.

## 2014-01-16 NOTE — Assessment & Plan Note (Signed)
Patient underwent drug-eluting stent to the proximal LAD on 12/27/13 with residual three-vessel CAD but continued stent patency and RCA, normal circumflex, and ramus intermediate. Moderate LV systolic dysfunction. Cath complicated by bump in troponins and CVA. Patient has recovered. He has had no further chest pain. Patient is supposed to be on aspirin 325 mg daily according to neurology but patient did not realize this and has only been taken 81 mg. Will increase to 325 mg daily. He is hypotensive today. Hold his Imdur or an lisinopril until his blood pressure comes back up. His wife will keep close eye on this. Follow-up with Dr. Burt Knack in 4 weeks. Blood sugar taken in the office was 171.

## 2014-01-16 NOTE — Progress Notes (Signed)
Carl Stephenson is a 69 y.o male patient of Dr. Burt Knack who has history of CAD S/P multiple PCI's with stenting RCA, anomalous CFX, and ramus intermedius from 2000 05/13/2005.  He was recently admitted with unstable angina and underwent drug-eluting stent to the proximal LAD on 12/27/13. His procedure was initially uncomplicated however he did have mild confusion in the evening of the procedure and a small bump in his troponins which was not unexpected. He was readmitted the following day with worsening confusion and MRI showed small acute left frontal infarct and subacute tiny right inferior cerebellar infarct. His symptoms improved within 24 hours. Aspirin was increased from 81 mg to 325 mg and Plavix was continued. Carotid duplex showed mild ICA stenosis. 2-D echo was without thrombi. EF 45-50% with mild hypokinesis of the basal mid inferior myocardium. He had grade 1 diastolic dysfunction.  Patient comes in today not feeling well. He is not a morning person and struggled to get here. He has not taken any medications yet today or eaten. He is hypotensive and bradycardic. PT saw him once at home and said he does not need their services. He denies any chest pain, palpitations, dyspnea, dyspnea on exertion, dizziness, or presyncope.  Allergies  Allergen Reactions  . Nitrofurantoin Monohyd Macro Hives and Rash     Current Outpatient Prescriptions  Medication Sig Dispense Refill  . aspirin EC 81 MG tablet Take 81 mg by mouth daily.    . Carboxymethylcellulose Sodium 0.25 % SOLN Place 1 drop into both eyes 5 (five) times daily.    . carvedilol (COREG) 6.25 MG tablet Take 1 tablet (6.25 mg total) by mouth 2 (two) times daily with a meal. 60 tablet 1  . clopidogrel (PLAVIX) 75 MG tablet Take 1 tablet (75 mg total) by mouth daily. (Patient taking differently: Take 75 mg by mouth at bedtime. ) 30 tablet 11  . ezetimibe (ZETIA) 10 MG tablet Take 10 mg by mouth at bedtime.    . fenofibrate 160 MG  tablet Take 1 tablet (160 mg total) by mouth daily. 30 tablet 1  . fexofenadine (ALLEGRA) 180 MG tablet Take 180 mg by mouth daily.    . insulin aspart (NOVOLOG FLEXPEN) 100 UNIT/ML FlexPen Inject 10 Units into the skin daily before supper.    . insulin detemir (LEVEMIR) 100 UNIT/ML injection Inject 0.4 mLs (40 Units total) into the skin daily with supper. 10 mL 11  . isosorbide mononitrate (IMDUR) 30 MG 24 hr tablet Take 30 mg by mouth daily.      [name] lisinopril (PRINIVIL,ZESTRIL) 10 MG tablet Take 0.5 tablets (5 mg total) by mouth daily.    . metFORMIN (GLUCOPHAGE) 850 MG tablet Take 850 mg by mouth 3 (three) times daily.      . nitroGLYCERIN (NITROSTAT) 0.4 MG SL tablet Place 1 tablet (0.4 mg total) under the tongue every 5 (five) minutes as needed. May repeat for up to 3 doses. (Patient taking differently: Place 0.4 mg under the tongue every 5 (five) minutes as needed for chest pain. May repeat for up to 3 doses.) 25 tablet 3  . oxazepam (SERAX) 10 MG capsule Take 10 mg by mouth 3 (three) times daily as needed for anxiety.     . pantoprazole (PROTONIX) 40 MG tablet Take 40 mg by mouth daily.      [name] PARoxetine (PAXIL) 30 MG tablet Take 30 mg by mouth daily.     [name] PRESCRIPTION MEDICATION Place 1 drop into both  eyes 5 (five) times daily. Carboxymethylcellulose 0.25% solution    . PRESCRIPTION MEDICATION Place 1 drop into the left eye at bedtime. Carboxymethylcellulose 1% opth gel    . rosuvastatin (CRESTOR) 10 MG tablet Take 1 tablet (10 mg total) by mouth daily at 6 PM. (Patient not taking: Reported on 12/29/2013) 30 tablet 5  . solifenacin (VESICARE) 5 MG tablet Take 5 mg by mouth daily.    [name] triamcinolone (KENALOG) 0.1 % paste Use as directed 1 application in the mouth or throat 2 (two) times daily as needed (wound care (mouth)).    . zolpidem (AMBIEN) 10 MG tablet Take 10 mg by mouth at bedtime.     No current facility-administered medications for this visit.    Past Medical History    Diagnosis Date  . Hypertension   . Hyperlipidemia   . GERD (gastroesophageal reflux disease)   . Obesity   . Bladder tumor     Recurrent  . Wide-complex tachycardia   . Ischemic chest pain 12/27/2013  . Cancer     "had TURBT; cancer free since ~ 11/2013/MD" (12/27/2013)  . TIA (transient ischemic attack) "several"  . Coronary artery disease     Cath 12/27/2013 EF 35-40%, 80-90% prox LAD treated with balloon angioplasty and 3.0x28 mm Xience DES, LCx with anomalous origin, 50-60% stenosis in OM, 50% stenosis in PDA and PLA  . Kidney stones     "passed them all" (12/27/2013)  . Inferior myocardial infarction 1994    Inferior, PCI and Streptokinase  . Tobacco user     Quit 1994  . Diabetes mellitus type II   . History of hiatal hernia   . Stroke ~ 2012    "left him weaker on LLE"  . Chronic lower back pain   . Acute encephalopathy admitted 12/28/2013    Past Surgical History  Procedure Laterality Date  . Transurethral resection of bladder tumor with gyrus (turbt-gyrus)  "several times"  . Left heart catheterization with coronary angiogram N/A 10/02/2011    Procedure: LEFT HEART CATHETERIZATION WITH CORONARY ANGIOGRAM;  Surgeon: [name] Bow, MD;  Location: [facility] CATH LAB;  Service: Cardiovascular;  Laterality: N/A;  . Left heart catheterization with coronary angiogram N/A 12/27/2013    Procedure: LEFT HEART CATHETERIZATION WITH CORONARY ANGIOGRAM;  Surgeon: Blane Ohara, MD;  Location: [facility] CATH LAB;  Service: Cardiovascular;  Laterality: N/A;  . Cardiac catheterization      "he's had a couple before today where they just looked around" (12/27/2013)  . Coronary angioplasty with stent placement      "he's had 3-4 stents put in before today"   . Coronary angioplasty with stent placement  12/27/2013    "1"    Family History  Problem Relation Age of Onset  . Coronary artery disease Other     History   Social History  . Marital Status: Married    Spouse Name: N/A     Number of Children: N/A  . Years of Education: N/A   Occupational History  . 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 Smoking status: Former Smoker -- 3.00 packs/day for 22 years    Types: Cigarettes    Quit date: 08/12/1992  . Smokeless tobacco: Never Used  . Alcohol Use: No  . Drug Use: No  . Sexual Activity: Not on file   Other Topics Concern  . Not on file   Social History Narrative   Married  2 grown children    ROS: See history of present illness otherwise negative  BP 80/50 mmHg  Pulse 52  Ht 5\' 7"  (1.702 m)  Wt 185 lb 1.9 oz (83.97 kg)  BMI 28.99 kg/m2  PHYSICAL EXAM: Well-nournished, in no acute distress. Neck: No JVD, HJR, Bruit, or thyroid enlargement  Lungs: No tachypnea, clear without wheezing, rales, or rhonchi  Cardiovascular: RRR, PMI not displaced, Normal S1 and S2, no murmurs, gallops, bruit, thrill, or heave.  Abdomen: BS normal. Soft without organomegaly, masses, lesions or tenderness.  Extremities: Right arm at cath site without hematoma or hemorrhage good radial and brachial pulses otherwise lower extremities without cyanosis, clubbing or edema. Good distal pulses bilateral  SKin: Warm, no lesions or rashes   Musculoskeletal: No deformities  Neuro: no focal signs   Wt Readings from Last 3 Encounters:  12/29/13 179 lb 1.6 oz (81.239 kg)  12/29/13 179 lb (81.194 kg)  12/28/13 184 lb 15.5 oz (83.9 kg)    Lab Results  Component Value Date   WBC 7.1 12/28/2013   HGB 12.7* 12/28/2013   HCT 36.6* 12/28/2013   PLT 144* 12/28/2013   GLUCOSE 186* 12/31/2013   CHOL 137 12/29/2013   TRIG 161* 12/29/2013   HDL 37* 12/29/2013   LDLCALC 68 12/29/2013   ALT 7 12/28/2013   AST 15 12/28/2013   NA 136* 12/31/2013   K 4.4 12/31/2013   CL 101 12/31/2013   CREATININE 1.18 12/31/2013   BUN 28* 12/31/2013   CO2 20 12/31/2013   TSH 1.070 12/28/2013   INR 1.10 12/28/2013   HGBA1C 7.3* 12/28/2013     EKG: Sinus bradycardia with PVC inferior Q waves poor R wave progression anteriorly T wave inversion laterally, no acute change   2-D echo 12/29/13: - Left ventricle: The cavity size was normal. Wall thickness was   increased in a pattern of mild LVH. Systolic function was mildly   reduced. The estimated ejection fraction was in the range of 45%   to 50%. There is mild hypokinesis of the basal-midinferior   myocardium. Doppler parameters are consistent with abnormal left   ventricular relaxation (grade 1 diastolic dysfunction).     Carotid duplex 12/29/13: Bilateral - 1% to 39% ICA stenosis. Right vertebral Doppler signal is antegrade and thumping consistent with a possible more distal occlusion. Left vertebral artery flow is antegrade  PCI Data: Vessel - LAD/Segment - prox Percent Stenosis (pre)  90 TIMI-flow 3 Stent 3.0x28 mm Xience DES Percent Stenosis (post) 0 TIMI-flow (post) 3  Contrast: 125 cc Omnipaque  Radiation dose/Fluoro time: 8.4 minutes  Estimated Blood Loss: minimal  Final Conclusions:   1. Three-vessel coronary artery disease with severe stenosis of the proximal LAD, continued stent patency in the right coronary artery, anomalous circumflex, and ramus intermedius. 2. Moderate segmental LV systolic dysfunction   Recommendations:  Continued aggressive medical therapy. Lifelong dual antiplatelet therapy with aspirin and Plavix as tolerated. Would anticipate discharge home tomorrow.  Sherren Mocha MD, [facility] 12/27/2013, 10:11 AM

## 2014-01-16 NOTE — Patient Instructions (Signed)
Your physician has recommended you make the following change in your medication:  1.) STOP IMDUR 2.) INCREASE  ASPIRIN TO 325 MG DAILY--TAKE WITH FOOD  Your physician recommends that you schedule a follow-up appointment in: [place]. Please check your blood pressure at home once a day and keep a diary.  Hold lisinopril until blood pressure is > 120/

## 2014-01-17 DIAGNOSIS — Z0000 Encounter for general adult medical examination without abnormal findings: Secondary | ICD-10-CM | POA: Diagnosis not present

## 2014-01-17 DIAGNOSIS — E1165 Type 2 diabetes mellitus with hyperglycemia: Secondary | ICD-10-CM | POA: Diagnosis not present

## 2014-01-24 DIAGNOSIS — K219 Gastro-esophageal reflux disease without esophagitis: Secondary | ICD-10-CM | POA: Diagnosis not present

## 2014-01-24 DIAGNOSIS — I2510 Atherosclerotic heart disease of native coronary artery without angina pectoris: Secondary | ICD-10-CM | POA: Diagnosis not present

## 2014-01-24 DIAGNOSIS — E119 Type 2 diabetes mellitus without complications: Secondary | ICD-10-CM | POA: Diagnosis not present

## 2014-01-24 DIAGNOSIS — Z8673 Personal history of transient ischemic attack (TIA), and cerebral infarction without residual deficits: Secondary | ICD-10-CM | POA: Diagnosis not present

## 2014-01-31 DIAGNOSIS — I639 Cerebral infarction, unspecified: Secondary | ICD-10-CM

## 2014-02-15 ENCOUNTER — Encounter: Payer: Self-pay | Admitting: Cardiovascular Disease

## 2014-02-15 ENCOUNTER — Ambulatory Visit: Payer: Medicare Other | Admitting: Cardiovascular Disease

## 2014-02-15 ENCOUNTER — Ambulatory Visit (INDEPENDENT_AMBULATORY_CARE_PROVIDER_SITE_OTHER): Payer: Medicare Other | Admitting: Cardiovascular Disease

## 2014-02-15 VITALS — BP 115/60 | HR 55 | Ht 67.0 in | Wt 185.0 lb

## 2014-02-15 DIAGNOSIS — I639 Cerebral infarction, unspecified: Secondary | ICD-10-CM

## 2014-02-15 DIAGNOSIS — I2510 Atherosclerotic heart disease of native coronary artery without angina pectoris: Secondary | ICD-10-CM | POA: Diagnosis not present

## 2014-02-15 DIAGNOSIS — E785 Hyperlipidemia, unspecified: Secondary | ICD-10-CM

## 2014-02-15 NOTE — Progress Notes (Signed)
Cardiology Office Note   Date:  02/15/2014   ID:  Carl Stephenson, DOB 14-Jan-1945, MRN [id number]  PCP:  No PCP Per Patient  Cardiologist:  Sherren Mocha, MD    Chief Complaint  Patient presents with  . Follow-up     History of Present Illness: Carl Stephenson is a 69 y.o. male who presents for follow-up of CAD. He has undergone multiple PCI procedures, with stenting of the RCA, anomalous circumflex, and ramus intermedius. PCI procedures were performed in 2005-2007. The patient underwent cardiac catheterization in 2013 demonstrating moderate LAD stenosis and medical therapy was recommended. The patient did reasonably well until December 2015 when he presented with symptoms of unstable angina. Cardiac catheterization demonstrated severe stenosis of the proximal LAD and the patient was treated with PCI. A drug-eluting stent was utilized with a good procedural result. However, the patient developed headache and confusion following catheterization. He suffered a post procedural stroke. He has had multiple strokes over the last few years. He has become quite limited physically.  Overall he is doing relatively well at present. His anginal symptoms have resolved. He denies chest pain or pressure. He's had no shortness of breath, edema, orthopnea, or PND. He's not very active anymore.  Past Medical History  Diagnosis Date  . Hypertension   . Hyperlipidemia   . GERD (gastroesophageal reflux disease)   . Obesity   . Bladder tumor     Recurrent  . Wide-complex tachycardia   . Ischemic chest pain 12/27/2013  . Cancer     "had TURBT; cancer free since ~ 11/2013/MD" (12/27/2013)  . TIA (transient ischemic attack) "several"  . Coronary artery disease     Cath 12/27/2013 EF 35-40%, 80-90% prox LAD treated with balloon angioplasty and 3.0x28 mm Xience DES, LCx with anomalous origin, 50-60% stenosis in OM, 50% stenosis in PDA and PLA  . Kidney stones     "passed them all" (12/27/2013)  .  Inferior myocardial infarction 1994    Inferior, PCI and Streptokinase  . Tobacco user     Quit 1994  . Diabetes mellitus type II   . History of hiatal hernia   . Stroke ~ 2012    "left him weaker on LLE"  . Chronic lower back pain   . Acute encephalopathy admitted 12/28/2013    Past Surgical History  Procedure Laterality Date  . Transurethral resection of bladder tumor with gyrus (turbt-gyrus)  "several times"  . Left heart catheterization with coronary angiogram N/A 10/02/2011    Procedure: LEFT HEART CATHETERIZATION WITH CORONARY ANGIOGRAM;  Surgeon: [name] Bow, MD;  Location: [facility] CATH LAB;  Service: Cardiovascular;  Laterality: N/A;  . Left heart catheterization with coronary angiogram N/A 12/27/2013    Procedure: LEFT HEART CATHETERIZATION WITH CORONARY ANGIOGRAM;  Surgeon: Blane Ohara, MD;  Location: [facility] CATH LAB;  Service: Cardiovascular;  Laterality: N/A;  . Cardiac catheterization      "he's had a couple before today where they just looked around" (12/27/2013)  . Coronary angioplasty with stent placement      "he's had 3-4 stents put in before today"   . Coronary angioplasty with stent placement  12/27/2013    "1"    Current Outpatient Prescriptions  Medication Sig Dispense Refill  . aspirin EC 325 MG tablet Take 1 tablet (325 mg total) by mouth daily. 30 tablet 0  . Carboxymethylcellulose Sodium 0.25 % SOLN Place 1 drop into both eyes 5 (five) times daily.    . carvedilol (  COREG) 6.25 MG tablet Take 1 tablet (6.25 mg total) by mouth 2 (two) times daily with a meal. 60 tablet 1  . ezetimibe (ZETIA) 10 MG tablet Take 10 mg by mouth at bedtime.    . fexofenadine (ALLEGRA) 180 MG tablet Take 180 mg by mouth daily.    . insulin aspart (NOVOLOG FLEXPEN) 100 UNIT/ML FlexPen Inject 10 Units into the skin daily before supper.    . insulin detemir (LEVEMIR) 100 UNIT/ML injection Inject 0.4 mLs (40 Units total) into the skin daily with supper. 10 mL 11  . lisinopril  (PRINIVIL,ZESTRIL) 10 MG tablet Take 0.5 tablets (5 mg total) by mouth daily.    . metFORMIN (GLUCOPHAGE) 850 MG tablet Take 850 mg by mouth 3 (three) times daily with meals.    . nitroGLYCERIN (NITROSTAT) 0.4 MG SL tablet Place 1 tablet (0.4 mg total) under the tongue every 5 (five) minutes as needed. May repeat for up to 3 doses. (Patient taking differently: Place 0.4 mg under the tongue every 5 (five) minutes as needed for chest pain. May repeat for up to 3 doses.) 25 tablet 3  . oxazepam (SERAX) 10 MG capsule Take 10 mg by mouth 3 (three) times daily as needed for anxiety.     . pantoprazole (PROTONIX) 40 MG tablet Take 40 mg by mouth daily.      [name] PARoxetine (PAXIL) 30 MG tablet Take 30 mg by mouth daily.     [name] PRESCRIPTION MEDICATION Place 1 drop into both eyes 5 (five) times daily. Carboxymethylcellulose 0.25% solution    . PRESCRIPTION MEDICATION Place 1 drop into the left eye at bedtime. Carboxymethylcellulose 1% opth gel    . rosuvastatin (CRESTOR) 10 MG tablet Take 1 tablet (10 mg total) by mouth daily at 6 PM. 30 tablet 5  . solifenacin (VESICARE) 5 MG tablet Take 5 mg by mouth daily.    [name] triamcinolone (KENALOG) 0.1 % paste Use as directed 1 application in the mouth or throat 2 (two) times daily as needed (wound care (mouth)).    . fenofibrate 160 MG tablet Take 160 mg by mouth daily.   1   No current facility-administered medications for this visit.    Allergies:   Nitrofurantoin monohyd macro   Social History:  The patient  reports that he quit smoking about 21 years ago. His smoking use included Cigarettes. He has a 66 pack-year smoking history. He has never used smokeless tobacco. He reports that he does not drink alcohol or use illicit drugs.   Family History:  The patient's family history includes Coronary artery disease in his other.    ROS:  Please see the history of present illness.  Otherwise, review of systems is positive for visual disturbance, cough, easy bruising,  headaches.  All other systems are reviewed and negative.    PHYSICAL EXAM: VS:  BP 115/60 mmHg  Pulse 55  Ht 5\' 7"  (1.702 m)  Wt 185 lb (83.915 kg)  BMI 28.97 kg/m2 , BMI Body mass index is 28.97 kg/(m^2). GEN: Well nourished, well developed, in no acute distress HEENT: normal Neck: no JVD, carotid bruits, or masses Cardiac: RRR without murmur or gallop                No peripheral edema Respiratory:  clear to auscultation bilaterally, normal work of breathing GI: soft, nontender, nondistended, + BS MS: no deformity or atrophy Skin: warm and dry, no rash Neuro:  Strength and sensation are intact Psych: euthymic mood, full  affect  EKG:  EKG is not ordered today.  Recent Labs: 12/28/2013: ALT 7; Hemoglobin 12.7*; Platelets 144*; TSH 1.070 12/31/2013: BUN 28*; Creatinine 1.18; Magnesium 2.0; Potassium 4.4; Sodium 136*   Lipid Panel     Component Value Date/Time   CHOL 137 12/29/2013 0325   TRIG 161* 12/29/2013 0325   HDL 37* 12/29/2013 0325   CHOLHDL 3.7 12/29/2013 0325   VLDL 32 12/29/2013 0325   LDLCALC 68 12/29/2013 0325      Wt Readings from Last 3 Encounters:  02/15/14 185 lb (83.915 kg)  01/16/14 185 lb 1.9 oz (83.97 kg)  12/29/13 179 lb 1.6 oz (81.239 kg)    ASSESSMENT AND PLAN: 1.  CAD, native vessel, without symptoms of angina: The patient has had good symptomatic relief of his anginal symptoms after his most recent PCI. He should remain on long-term aspirin and Plavix, both to reduce ischemic cardiac and neurologic events. He otherwise will continue his current medical therapy.  2. Essential hypertension: Blood pressure well controlled. Antihypertensive program reviewed. This includes carvedilol and lisinopril.  3. Cerebrovascular disease: The patient is followed by neurology. He is on appropriate risk reduction program.  4. Hyperlipidemia. He takes a combination of Zetia and Crestor. Will repeat lipids and LFTs when he returns in 6 months.  5. Type 2  diabetes, insulin requiring. Reports most recent hemoglobin A1c 7.1.   Current medicines are reviewed with the patient today.  The patient does not have concerns regarding medicines.  The following changes have been made:  no change  Labs/ tests ordered today include:  Orders Placed This Encounter  Procedures  . Lipid panel  . Hepatic function panel    Disposition:   FU in 6 months with a return visit and same day labs.  Signed, Sherren Mocha, MD  02/15/2014 11:22 AM    [facility] [place], [place], [place]  [address] Phone: [phone]; Fax: [phone]

## 2014-02-15 NOTE — Patient Instructions (Addendum)
Your physician recommends that you return for a FASTING LIPID and LIVER profile in 6 MONTHS--nothing to eat or drink after midnight, lab opens at 7:30 AM  Your physician wants you to follow-up in: 6 MONTHS with Dr Burt Knack.  You will receive a reminder letter in the mail two months in advance. If you don't receive a letter, please call our office to schedule the follow-up appointment.  Your physician recommends that you continue on your current medications as directed. Please refer to the Current Medication list given to you today.

## 2014-04-11 DIAGNOSIS — E1165 Type 2 diabetes mellitus with hyperglycemia: Secondary | ICD-10-CM | POA: Diagnosis not present

## 2014-04-18 DIAGNOSIS — E1165 Type 2 diabetes mellitus with hyperglycemia: Secondary | ICD-10-CM | POA: Diagnosis not present

## 2014-04-18 DIAGNOSIS — Z9181 History of falling: Secondary | ICD-10-CM | POA: Diagnosis not present

## 2014-04-18 DIAGNOSIS — I639 Cerebral infarction, unspecified: Secondary | ICD-10-CM | POA: Diagnosis not present

## 2014-06-12 DIAGNOSIS — R312 Other microscopic hematuria: Secondary | ICD-10-CM | POA: Diagnosis not present

## 2014-06-12 DIAGNOSIS — N400 Enlarged prostate without lower urinary tract symptoms: Secondary | ICD-10-CM | POA: Diagnosis not present

## 2014-06-12 DIAGNOSIS — C679 Malignant neoplasm of bladder, unspecified: Secondary | ICD-10-CM | POA: Diagnosis not present

## 2014-06-12 DIAGNOSIS — R351 Nocturia: Secondary | ICD-10-CM | POA: Diagnosis not present

## 2014-06-12 DIAGNOSIS — N3091 Cystitis, unspecified with hematuria: Secondary | ICD-10-CM | POA: Diagnosis not present

## 2014-07-26 ENCOUNTER — Other Ambulatory Visit: Payer: Self-pay | Admitting: Physician Assistant

## 2014-07-26 NOTE — Telephone Encounter (Signed)
Please review for refill, [place] pt. 

## 2014-07-27 NOTE — Telephone Encounter (Signed)
The patient's wife called back. She verified that the patient is taking lisinopril 10 mg one tablet by mouth daily. They would like #90 day supply sent in.

## 2014-07-27 NOTE — Telephone Encounter (Signed)
I left a message for the patient to call and clarify his dose of lisinopril. Chart states lisinopril 10 mg 1/2 tablet by mouth daily. Refill request is for lisinopril 10 mg one tablet daily.

## 2014-09-28 DIAGNOSIS — E1165 Type 2 diabetes mellitus with hyperglycemia: Secondary | ICD-10-CM | POA: Diagnosis not present

## 2014-10-19 DIAGNOSIS — Z23 Encounter for immunization: Secondary | ICD-10-CM | POA: Diagnosis not present

## 2014-10-24 DIAGNOSIS — I2510 Atherosclerotic heart disease of native coronary artery without angina pectoris: Secondary | ICD-10-CM | POA: Diagnosis not present

## 2014-10-24 DIAGNOSIS — E1165 Type 2 diabetes mellitus with hyperglycemia: Secondary | ICD-10-CM | POA: Diagnosis not present

## 2014-10-24 DIAGNOSIS — I639 Cerebral infarction, unspecified: Secondary | ICD-10-CM | POA: Diagnosis not present

## 2015-05-02 DIAGNOSIS — E1165 Type 2 diabetes mellitus with hyperglycemia: Secondary | ICD-10-CM | POA: Diagnosis not present

## 2015-05-08 DIAGNOSIS — E1165 Type 2 diabetes mellitus with hyperglycemia: Secondary | ICD-10-CM | POA: Diagnosis not present

## 2015-05-08 DIAGNOSIS — Z9181 History of falling: Secondary | ICD-10-CM | POA: Diagnosis not present

## 2015-05-08 DIAGNOSIS — Z0000 Encounter for general adult medical examination without abnormal findings: Secondary | ICD-10-CM | POA: Diagnosis not present

## 2015-05-08 DIAGNOSIS — Z1389 Encounter for screening for other disorder: Secondary | ICD-10-CM | POA: Diagnosis not present

## 2015-05-08 DIAGNOSIS — E782 Mixed hyperlipidemia: Secondary | ICD-10-CM | POA: Diagnosis not present

## 2015-07-17 DIAGNOSIS — R2689 Other abnormalities of gait and mobility: Secondary | ICD-10-CM | POA: Diagnosis not present

## 2015-07-17 DIAGNOSIS — H538 Other visual disturbances: Secondary | ICD-10-CM | POA: Diagnosis not present

## 2015-07-17 DIAGNOSIS — R42 Dizziness and giddiness: Secondary | ICD-10-CM | POA: Diagnosis not present

## 2015-07-17 DIAGNOSIS — R531 Weakness: Secondary | ICD-10-CM | POA: Diagnosis not present

## 2015-07-17 DIAGNOSIS — E161 Other hypoglycemia: Secondary | ICD-10-CM | POA: Diagnosis not present

## 2015-10-18 DIAGNOSIS — Z23 Encounter for immunization: Secondary | ICD-10-CM | POA: Diagnosis not present

## 2015-12-27 ENCOUNTER — Encounter: Payer: Self-pay | Admitting: Sports Medicine

## 2015-12-27 ENCOUNTER — Ambulatory Visit (INDEPENDENT_AMBULATORY_CARE_PROVIDER_SITE_OTHER): Payer: Medicare Other | Admitting: Sports Medicine

## 2015-12-27 DIAGNOSIS — M2142 Flat foot [pes planus] (acquired), left foot: Secondary | ICD-10-CM

## 2015-12-27 DIAGNOSIS — M2041 Other hammer toe(s) (acquired), right foot: Secondary | ICD-10-CM

## 2015-12-27 DIAGNOSIS — M79676 Pain in unspecified toe(s): Secondary | ICD-10-CM | POA: Diagnosis not present

## 2015-12-27 DIAGNOSIS — B351 Tinea unguium: Secondary | ICD-10-CM | POA: Diagnosis not present

## 2015-12-27 DIAGNOSIS — M2042 Other hammer toe(s) (acquired), left foot: Secondary | ICD-10-CM | POA: Diagnosis not present

## 2015-12-27 DIAGNOSIS — E1142 Type 2 diabetes mellitus with diabetic polyneuropathy: Secondary | ICD-10-CM | POA: Diagnosis not present

## 2015-12-27 DIAGNOSIS — E119 Type 2 diabetes mellitus without complications: Secondary | ICD-10-CM | POA: Diagnosis not present

## 2015-12-27 DIAGNOSIS — M2141 Flat foot [pes planus] (acquired), right foot: Secondary | ICD-10-CM | POA: Diagnosis not present

## 2015-12-27 NOTE — Patient Instructions (Signed)

## 2015-12-27 NOTE — Progress Notes (Signed)
Subjective: Carl Stephenson is a 70 y.o. male patient with history of diabetes who presents to office today complaining of long, painful nails  while ambulating in shoes; unable to trim. Patient states that the glucose reading this morning was 110 mg/dl. Patient denies any new changes in medication or new problems. Patient denies any new cramping, numbness, burning or tingling in the legs. Admits to baseline numbness, tingling and burning and tingling from prior in both feet.  Patient Active Problem List   Diagnosis Date Noted  . Hypotension 01/16/2014  . Acute CVA (cerebrovascular accident) ([place]) 12/29/2013  . Acute encephalopathy 12/28/2013  . Elevated troponin 12/28/2013  . Ischemic chest pain ([place]) 12/27/2013  . Seborrheic dermatitis 10/04/2012  . Microcytosis 11/07/2011  . Aphasia 06/26/2011  . 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 Cerebral thrombosis with cerebral infarction ([place]) 08/14/2010  . Temporary cerebral vascular dysfunction 10/24/2009  . BENIGN NEOPLASM OF BLADDER 03/16/2008  . Hyperlipidemia 03/16/2008  . Obesity 03/16/2008  . HYPERTENSION, BENIGN 03/16/2008  . MYOCARDIAL INFARCTION 03/16/2008  . CAD, NATIVE VESSEL 03/16/2008  . UNSPECIFIED PAROXYSMAL TACHYCARDIA 03/16/2008  . GERD 03/16/2008  . Diabetes mellitus ([place]) 03/16/2008   Current Outpatient Prescriptions on File Prior to Visit  Medication Sig Dispense Refill  . aspirin EC 325 MG tablet Take 1 tablet (325 mg total) by mouth daily. 30 tablet 0  . Carboxymethylcellulose Sodium 0.25 % SOLN Place 1 drop into both eyes 5 (five) times daily.    . carvedilol (COREG) 6.25 MG tablet Take 1 tablet (6.25 mg total) by mouth 2 (two) times daily with a meal. 60 tablet 1  . ezetimibe (ZETIA) 10 MG tablet Take 10 mg by mouth at bedtime.    . fenofibrate 160 MG tablet Take 160 mg by mouth daily.   1  . fexofenadine (ALLEGRA) 180 MG tablet Take 180 mg by mouth daily.    . insulin aspart (NOVOLOG FLEXPEN) 100  UNIT/ML FlexPen Inject 10 Units into the skin daily before supper.    . insulin detemir (LEVEMIR) 100 UNIT/ML injection Inject 0.4 mLs (40 Units total) into the skin daily with supper. 10 mL 11  . lisinopril (PRINIVIL,ZESTRIL) 10 MG tablet TAKE 1 TABLET BY MOUTH DAILY 90 tablet 3  . metFORMIN (GLUCOPHAGE) 850 MG tablet Take 850 mg by mouth 3 (three) times daily with meals.    . nitroGLYCERIN (NITROSTAT) 0.4 MG SL tablet Place 1 tablet (0.4 mg total) under the tongue every 5 (five) minutes as needed. May repeat for up to 3 doses. (Patient taking differently: Place 0.4 mg under the tongue every 5 (five) minutes as needed for chest pain. May repeat for up to 3 doses.) 25 tablet 3  . oxazepam (SERAX) 10 MG capsule Take 10 mg by mouth 3 (three) times daily as needed for anxiety.     . pantoprazole (PROTONIX) 40 MG tablet Take 40 mg by mouth daily.      [name] PARoxetine (PAXIL) 30 MG tablet Take 30 mg by mouth daily.     [name] PRESCRIPTION MEDICATION Place 1 drop into both eyes 5 (five) times daily. Carboxymethylcellulose 0.25% solution    . PRESCRIPTION MEDICATION Place 1 drop into the left eye at bedtime. Carboxymethylcellulose 1% opth gel    . rosuvastatin (CRESTOR) 10 MG tablet Take 1 tablet (10 mg total) by mouth daily at 6 PM. 30 tablet 5  . solifenacin (VESICARE) 5 MG tablet Take 5 mg by mouth daily.    [name] triamcinolone (KENALOG) 0.1 % paste  Use as directed 1 application in the mouth or throat 2 (two) times daily as needed (wound care (mouth)).     No current facility-administered medications on file prior to visit.    Allergies  Allergen Reactions  . Nitrofurantoin Monohyd Macro Hives and Rash    No results found for this or any previous visit (from the past 2160 hour(s)).  Objective: General: Patient is awake, alert, and oriented x 3 and in no acute distress.  Integument: Skin is warm, dry and supple bilateral. Nails are tender, long, thickened and dystrophic with subungual debris, consistent  with onychomycosis, 1-5 bilateral. No signs of infection. No open lesions or preulcerative lesions present bilateral. Remaining integument unremarkable.  Vasculature:  Dorsalis Pedis pulse 2/4 bilateral. Posterior Tibial pulse  1/4 bilateral. Capillary fill time <3 sec 1-5 bilateral. Positive hair growth to the level of the digits.Temperature gradient within normal limits. Mild varicosities present bilateral. No edema present bilateral.   Neurology: The patient has diminished sensation measured with a 5.07/10g Semmes Weinstein Monofilament at all pedal sites bilateral . Vibratory sensation diminished bilateral with tuning fork. No Babinski sign present bilateral.   Musculoskeletal: Asymptomatic pes planus and mild hammertoe pedal deformities noted bilateral. Muscular strength 5/5 in all lower extremity muscular groups bilateral without pain on range of motion . No tenderness with calf compression bilateral.  Assessment and Plan: Problem List Items Addressed This Visit    None    Visit Diagnoses    Pain due to onychomycosis of toenail    -  Primary   Diabetic polyneuropathy associated with type 2 diabetes mellitus (HCC)       Pes planus of both feet       Hammer toes of both feet       Encounter for comprehensive diabetic foot examination, type 2 diabetes mellitus ([place])          -Examined patient. -Discussed and educated patient on diabetic foot care, especially with  regards to the vascular, neurological and musculoskeletal systems.  -Stressed the importance of good glycemic control and the detriment of not controlling glucose levels in relation to the foot. -Mechanically debrided all nails 1-5 bilateral using sterile nail nipper and filed with dremel without incident  -Safe step diabetic shoe order form was completed; office to contact primary care for approval / certification;  Office to arrange shoe fitting and dispensing. -Answered all patient questions -Patient to return  in 3 months  for at risk foot care -Patient advised to call the office if any problems or questions arise in the meantime.  Landis Martins, DPM

## 2016-01-15 ENCOUNTER — Emergency Department (HOSPITAL_COMMUNITY): Payer: Medicare Other

## 2016-01-15 ENCOUNTER — Inpatient Hospital Stay (HOSPITAL_COMMUNITY)
Admission: EM | Admit: 2016-01-15 | Discharge: 2016-01-21 | DRG: 040 | Disposition: A | Discharge status: Home Health | Payer: Medicare Other | Attending: Internal Medicine | Admitting: Internal Medicine

## 2016-01-15 ENCOUNTER — Encounter (HOSPITAL_COMMUNITY): Payer: Self-pay | Admitting: *Deleted

## 2016-01-15 ENCOUNTER — Inpatient Hospital Stay (HOSPITAL_COMMUNITY): Payer: Medicare Other

## 2016-01-15 ENCOUNTER — Ambulatory Visit (HOSPITAL_COMMUNITY): Admit: 2016-01-15 | Payer: Federal, State, Local not specified - PPO | Admitting: Cardiology

## 2016-01-15 ENCOUNTER — Encounter (HOSPITAL_COMMUNITY)
Admission: EM | Disposition: A | Discharge status: Home Health | Payer: Self-pay | Source: Home / Self Care | Attending: Internal Medicine

## 2016-01-15 DIAGNOSIS — E872 Acidosis, unspecified: Secondary | ICD-10-CM

## 2016-01-15 DIAGNOSIS — R001 Bradycardia, unspecified: Secondary | ICD-10-CM

## 2016-01-15 DIAGNOSIS — I517 Cardiomegaly: Secondary | ICD-10-CM | POA: Diagnosis not present

## 2016-01-15 DIAGNOSIS — N179 Acute kidney failure, unspecified: Secondary | ICD-10-CM | POA: Diagnosis present

## 2016-01-15 DIAGNOSIS — I447 Left bundle-branch block, unspecified: Secondary | ICD-10-CM | POA: Diagnosis not present

## 2016-01-15 DIAGNOSIS — I6501 Occlusion and stenosis of right vertebral artery: Secondary | ICD-10-CM | POA: Diagnosis present

## 2016-01-15 DIAGNOSIS — I5033 Acute on chronic diastolic (congestive) heart failure: Secondary | ICD-10-CM | POA: Diagnosis not present

## 2016-01-15 DIAGNOSIS — G9341 Metabolic encephalopathy: Secondary | ICD-10-CM | POA: Diagnosis present

## 2016-01-15 DIAGNOSIS — E1159 Type 2 diabetes mellitus with other circulatory complications: Secondary | ICD-10-CM | POA: Diagnosis present

## 2016-01-15 DIAGNOSIS — I6340 Cerebral infarction due to embolism of unspecified cerebral artery: Secondary | ICD-10-CM | POA: Diagnosis not present

## 2016-01-15 DIAGNOSIS — I6789 Other cerebrovascular disease: Secondary | ICD-10-CM | POA: Diagnosis present

## 2016-01-15 DIAGNOSIS — K219 Gastro-esophageal reflux disease without esophagitis: Secondary | ICD-10-CM | POA: Diagnosis present

## 2016-01-15 DIAGNOSIS — G9340 Encephalopathy, unspecified: Secondary | ICD-10-CM

## 2016-01-15 DIAGNOSIS — I10 Essential (primary) hypertension: Secondary | ICD-10-CM | POA: Diagnosis not present

## 2016-01-15 DIAGNOSIS — I252 Old myocardial infarction: Secondary | ICD-10-CM

## 2016-01-15 DIAGNOSIS — R4182 Altered mental status, unspecified: Secondary | ICD-10-CM

## 2016-01-15 DIAGNOSIS — I25709 Atherosclerosis of coronary artery bypass graft(s), unspecified, with unspecified angina pectoris: Secondary | ICD-10-CM

## 2016-01-15 DIAGNOSIS — Z7902 Long term (current) use of antithrombotics/antiplatelets: Secondary | ICD-10-CM

## 2016-01-15 DIAGNOSIS — Q211 Atrial septal defect: Secondary | ICD-10-CM

## 2016-01-15 DIAGNOSIS — I248 Other forms of acute ischemic heart disease: Secondary | ICD-10-CM | POA: Diagnosis present

## 2016-01-15 DIAGNOSIS — R9431 Abnormal electrocardiogram [ECG] [EKG]: Secondary | ICD-10-CM | POA: Diagnosis not present

## 2016-01-15 DIAGNOSIS — I69398 Other sequelae of cerebral infarction: Secondary | ICD-10-CM | POA: Diagnosis not present

## 2016-01-15 DIAGNOSIS — Z955 Presence of coronary angioplasty implant and graft: Secondary | ICD-10-CM

## 2016-01-15 DIAGNOSIS — Z6831 Body mass index (BMI) 31.0-31.9, adult: Secondary | ICD-10-CM | POA: Diagnosis not present

## 2016-01-15 DIAGNOSIS — G919 Hydrocephalus, unspecified: Secondary | ICD-10-CM | POA: Diagnosis not present

## 2016-01-15 DIAGNOSIS — R0600 Dyspnea, unspecified: Secondary | ICD-10-CM | POA: Diagnosis not present

## 2016-01-15 DIAGNOSIS — Z8673 Personal history of transient ischemic attack (TIA), and cerebral infarction without residual deficits: Secondary | ICD-10-CM | POA: Diagnosis not present

## 2016-01-15 DIAGNOSIS — E1151 Type 2 diabetes mellitus with diabetic peripheral angiopathy without gangrene: Secondary | ICD-10-CM | POA: Diagnosis present

## 2016-01-15 DIAGNOSIS — I2510 Atherosclerotic heart disease of native coronary artery without angina pectoris: Secondary | ICD-10-CM | POA: Diagnosis not present

## 2016-01-15 DIAGNOSIS — R569 Unspecified convulsions: Secondary | ICD-10-CM | POA: Diagnosis not present

## 2016-01-15 DIAGNOSIS — F329 Major depressive disorder, single episode, unspecified: Secondary | ICD-10-CM | POA: Diagnosis present

## 2016-01-15 DIAGNOSIS — Z87891 Personal history of nicotine dependence: Secondary | ICD-10-CM

## 2016-01-15 DIAGNOSIS — Z794 Long term (current) use of insulin: Secondary | ICD-10-CM

## 2016-01-15 DIAGNOSIS — Z8249 Family history of ischemic heart disease and other diseases of the circulatory system: Secondary | ICD-10-CM

## 2016-01-15 DIAGNOSIS — I69392 Facial weakness following cerebral infarction: Secondary | ICD-10-CM

## 2016-01-15 DIAGNOSIS — Z881 Allergy status to other antibiotic agents status: Secondary | ICD-10-CM

## 2016-01-15 DIAGNOSIS — I6523 Occlusion and stenosis of bilateral carotid arteries: Secondary | ICD-10-CM | POA: Diagnosis present

## 2016-01-15 DIAGNOSIS — I639 Cerebral infarction, unspecified: Secondary | ICD-10-CM | POA: Diagnosis not present

## 2016-01-15 DIAGNOSIS — E785 Hyperlipidemia, unspecified: Secondary | ICD-10-CM | POA: Diagnosis present

## 2016-01-15 DIAGNOSIS — I638 Other cerebral infarction: Secondary | ICD-10-CM | POA: Diagnosis not present

## 2016-01-15 DIAGNOSIS — Z79899 Other long term (current) drug therapy: Secondary | ICD-10-CM

## 2016-01-15 DIAGNOSIS — Z8551 Personal history of malignant neoplasm of bladder: Secondary | ICD-10-CM

## 2016-01-15 DIAGNOSIS — I110 Hypertensive heart disease with heart failure: Secondary | ICD-10-CM | POA: Diagnosis present

## 2016-01-15 DIAGNOSIS — E669 Obesity, unspecified: Secondary | ICD-10-CM | POA: Diagnosis present

## 2016-01-15 DIAGNOSIS — Z7982 Long term (current) use of aspirin: Secondary | ICD-10-CM

## 2016-01-15 DIAGNOSIS — R402431 Glasgow coma scale score 3-8, in the field [EMT or ambulance]: Secondary | ICD-10-CM | POA: Diagnosis not present

## 2016-01-15 HISTORY — PX: CARDIAC CATHETERIZATION: SHX172

## 2016-01-15 HISTORY — DX: Bradycardia, unspecified: R00.1

## 2016-01-15 LAB — URINALYSIS, ROUTINE W REFLEX MICROSCOPIC
BILIRUBIN URINE: NEGATIVE
Bacteria, UA: NONE SEEN
Glucose, UA: >=500 mg/dL — AB
Ketones, ur: NEGATIVE mg/dL
Leukocytes, UA: NEGATIVE
NITRITE: NEGATIVE
PH: 6 (ref 5.0–8.0)
Protein, ur: 100 mg/dL — AB
Specific Gravity, Urine: 1.045 — ABNORMAL HIGH (ref 1.005–1.030)
Squamous Epithelial / LPF: NONE SEEN

## 2016-01-15 LAB — RAPID URINE DRUG SCREEN, HOSP PERFORMED
Amphetamines: NOT DETECTED
BARBITURATES: NOT DETECTED
Benzodiazepines: NOT DETECTED
Cocaine: NOT DETECTED
Opiates: NOT DETECTED
Tetrahydrocannabinol: NOT DETECTED

## 2016-01-15 LAB — APTT: aPTT: 31 seconds (ref 24–36)

## 2016-01-15 LAB — COMPREHENSIVE METABOLIC PANEL
ALK PHOS: 61 U/L (ref 38–126)
ALT: 13 U/L — AB (ref 17–63)
AST: 20 U/L (ref 15–41)
Albumin: 3.3 g/dL — ABNORMAL LOW (ref 3.5–5.0)
Anion gap: 11 (ref 5–15)
BILIRUBIN TOTAL: 0.5 mg/dL (ref 0.3–1.2)
BUN: 22 mg/dL — AB (ref 6–20)
CO2: 20 mmol/L — ABNORMAL LOW (ref 22–32)
CREATININE: 1.39 mg/dL — AB (ref 0.61–1.24)
Calcium: 8.7 mg/dL — ABNORMAL LOW (ref 8.9–10.3)
Chloride: 106 mmol/L (ref 101–111)
GFR calc Af Amer: 57 mL/min — ABNORMAL LOW (ref >60)
GFR, EST NON AFRICAN AMERICAN: 49 mL/min — AB (ref >60)
Glucose, Bld: 258 mg/dL — ABNORMAL HIGH (ref 65–99)
Potassium: 4.9 mmol/L (ref 3.5–5.1)
Sodium: 137 mmol/L (ref 135–145)
TOTAL PROTEIN: 6.1 g/dL — AB (ref 6.5–8.1)

## 2016-01-15 LAB — TROPONIN I: TROPONIN I: 0.08 ng/mL — AB (ref <0.03)

## 2016-01-15 LAB — MAGNESIUM: Magnesium: 2.1 mg/dL (ref 1.7–2.4)

## 2016-01-15 LAB — GLUCOSE, CAPILLARY
GLUCOSE-CAPILLARY: 245 mg/dL — AB (ref 65–99)
GLUCOSE-CAPILLARY: 155 mg/dL — AB (ref 65–99)
GLUCOSE-CAPILLARY: 175 mg/dL — AB (ref 65–99)
Glucose-Capillary: 210 mg/dL — ABNORMAL HIGH (ref 65–99)

## 2016-01-15 LAB — DIFFERENTIAL
Basophils Absolute: 0 10*3/uL (ref 0.0–0.1)
Basophils Relative: 0 %
EOS ABS: 0.2 10*3/uL (ref 0.0–0.7)
EOS PCT: 3 %
LYMPHS ABS: 1.1 10*3/uL (ref 0.7–4.0)
Lymphocytes Relative: 13 %
MONO ABS: 0.4 10*3/uL (ref 0.1–1.0)
MONOS PCT: 5 %
Neutro Abs: 6.8 10*3/uL (ref 1.7–7.7)
Neutrophils Relative %: 79 %

## 2016-01-15 LAB — I-STAT CHEM 8, ED
BUN: 24 mg/dL — AB (ref 6–20)
Calcium, Ion: 1.16 mmol/L (ref 1.15–1.40)
Chloride: 104 mmol/L (ref 101–111)
Creatinine, Ser: 1.5 mg/dL — ABNORMAL HIGH (ref 0.61–1.24)
Glucose, Bld: 255 mg/dL — ABNORMAL HIGH (ref 65–99)
HEMATOCRIT: 38 % — AB (ref 39.0–52.0)
Hemoglobin: 12.9 g/dL — ABNORMAL LOW (ref 13.0–17.0)
Potassium: 4.8 mmol/L (ref 3.5–5.1)
SODIUM: 138 mmol/L (ref 135–145)
TCO2: 22 mmol/L (ref 0–100)

## 2016-01-15 LAB — I-STAT CG4 LACTIC ACID, ED: Lactic Acid, Venous: 4.62 mmol/L (ref 0.5–1.9)

## 2016-01-15 LAB — LIPID PANEL
CHOL/HDL RATIO: 7.1 ratio
Cholesterol: 214 mg/dL — ABNORMAL HIGH (ref 0–200)
HDL: 30 mg/dL — ABNORMAL LOW (ref >40)
LDL Cholesterol: 127 mg/dL — ABNORMAL HIGH (ref 0–99)
Triglycerides: 283 mg/dL — ABNORMAL HIGH (ref <150)
VLDL: 57 mg/dL — ABNORMAL HIGH (ref 0–40)

## 2016-01-15 LAB — CBC
HEMATOCRIT: 39.8 % (ref 39.0–52.0)
HEMOGLOBIN: 13.2 g/dL (ref 13.0–17.0)
MCH: 25.8 pg — AB (ref 26.0–34.0)
MCHC: 33.2 g/dL (ref 30.0–36.0)
MCV: 77.7 fL — ABNORMAL LOW (ref 78.0–100.0)
Platelets: 172 10*3/uL (ref 150–400)
RBC: 5.12 MIL/uL (ref 4.22–5.81)
RDW: 13.7 % (ref 11.5–15.5)
WBC: 8.6 10*3/uL (ref 4.0–10.5)

## 2016-01-15 LAB — MRSA PCR SCREENING: MRSA BY PCR: NEGATIVE

## 2016-01-15 LAB — LACTIC ACID, PLASMA
LACTIC ACID, VENOUS: 1.1 mmol/L (ref 0.5–1.9)
Lactic Acid, Venous: 1.2 mmol/L (ref 0.5–1.9)

## 2016-01-15 LAB — ETHANOL: Alcohol, Ethyl (B): <5 mg/dL (ref <5)

## 2016-01-15 LAB — PROTIME-INR
INR: 0.98
Prothrombin Time: 13 seconds (ref 11.4–15.2)

## 2016-01-15 LAB — I-STAT TROPONIN, ED: TROPONIN I, POC: 0.09 ng/mL — AB (ref 0.00–0.08)

## 2016-01-15 LAB — AMMONIA: Ammonia: 38 umol/L — ABNORMAL HIGH (ref 9–35)

## 2016-01-15 SURGERY — LEFT HEART CATH AND CORONARY ANGIOGRAPHY
Anesthesia: LOCAL

## 2016-01-15 MED ORDER — INSULIN ASPART 100 UNIT/ML ~~LOC~~ SOLN
0.0000 [IU] | Freq: Three times a day (TID) | SUBCUTANEOUS | Status: DC
  Administered 2016-01-15 (×2): 5 [IU] via SUBCUTANEOUS
  Administered 2016-01-16 (×2): 2 [IU] via SUBCUTANEOUS

## 2016-01-15 MED ORDER — ASPIRIN 81 MG PO CHEW
81.0000 mg | CHEWABLE_TABLET | Freq: Every day | ORAL | Status: DC
  Administered 2016-01-16: 81 mg via ORAL
  Filled 2016-01-15: qty 1

## 2016-01-15 MED ORDER — DARIFENACIN HYDROBROMIDE ER 7.5 MG PO TB24: 7.5000 mg | ORAL_TABLET | Freq: Every day | ORAL | Status: DC

## 2016-01-15 MED ORDER — HEPARIN SODIUM (PORCINE) 5000 UNIT/ML IJ SOLN: 5000.0000 [IU] | Freq: Three times a day (TID) | INTRAMUSCULAR | Status: DC

## 2016-01-15 MED ORDER — HEPARIN (PORCINE) IN NACL 2-0.9 UNIT/ML-% IJ SOLN
INTRAMUSCULAR | Status: AC | PRN
  Administered 2016-01-15: 1000 mL via INTRA_ARTERIAL

## 2016-01-15 MED ORDER — PANTOPRAZOLE SODIUM 40 MG PO TBEC
40.0000 mg | DELAYED_RELEASE_TABLET | Freq: Every day | ORAL | Status: DC
  Administered 2016-01-16 – 2016-01-21 (×6): 40 mg via ORAL
  Filled 2016-01-15 (×6): qty 1

## 2016-01-15 MED ORDER — NITROGLYCERIN 1 MG/10 ML FOR IR/CATH LAB
INTRA_ARTERIAL | Status: AC
  Filled 2016-01-15: qty 10

## 2016-01-15 MED ORDER — SODIUM CHLORIDE 0.9 % IV SOLN
1000.0000 mg | Freq: Once | INTRAVENOUS | Status: AC
  Administered 2016-01-15: 1000 mg via INTRAVENOUS
  Filled 2016-01-15: qty 10

## 2016-01-15 MED ORDER — SODIUM CHLORIDE 0.9% FLUSH: 3.0000 mL | INTRAVENOUS | Status: DC | PRN

## 2016-01-15 MED ORDER — VERAPAMIL HCL 2.5 MG/ML IV SOLN
INTRAVENOUS | Status: AC | PRN
  Administered 2016-01-15: 10 mL via INTRA_ARTERIAL

## 2016-01-15 MED ORDER — CARVEDILOL 6.25 MG PO TABS: 6.2500 mg | ORAL_TABLET | Freq: Two times a day (BID) | ORAL | Status: DC

## 2016-01-15 MED ORDER — INSULIN ASPART 100 UNIT/ML ~~LOC~~ SOLN: 0.0000 [IU] | Freq: Every day | SUBCUTANEOUS | Status: DC

## 2016-01-15 MED ORDER — CARVEDILOL 6.25 MG PO TABS
6.2500 mg | ORAL_TABLET | Freq: Two times a day (BID) | ORAL | Status: DC
  Administered 2016-01-15: 6.25 mg via ORAL
  Filled 2016-01-15: qty 1

## 2016-01-15 MED ORDER — EZETIMIBE 10 MG PO TABS: 10.0000 mg | ORAL_TABLET | Freq: Every day | ORAL | Status: DC

## 2016-01-15 MED ORDER — LIDOCAINE HCL (PF) 1 % IJ SOLN
INTRAMUSCULAR | Status: AC | PRN
Start: 2016-01-15 — End: ?
  Administered 2016-01-15: 2 mL via INTRADERMAL

## 2016-01-15 MED ORDER — ASPIRIN 81 MG PO CHEW: 324.0000 mg | CHEWABLE_TABLET | ORAL | Status: AC

## 2016-01-15 MED ORDER — PAROXETINE HCL 20 MG PO TABS: 30.0000 mg | ORAL_TABLET | Freq: Every day | ORAL | Status: DC

## 2016-01-15 MED ORDER — LEVETIRACETAM 500 MG PO TABS
500.0000 mg | ORAL_TABLET | Freq: Two times a day (BID) | ORAL | Status: DC
  Administered 2016-01-15 – 2016-01-21 (×12): 500 mg via ORAL
  Filled 2016-01-15 (×12): qty 1

## 2016-01-15 MED ORDER — VERAPAMIL HCL 2.5 MG/ML IV SOLN
INTRAVENOUS | Status: AC
  Filled 2016-01-15: qty 2

## 2016-01-15 MED ORDER — SODIUM CHLORIDE 0.9% FLUSH: 3.0000 mL | Freq: Two times a day (BID) | INTRAVENOUS | Status: DC

## 2016-01-15 MED ORDER — ONDANSETRON HCL 4 MG/2ML IJ SOLN: 4.0000 mg | Freq: Four times a day (QID) | INTRAMUSCULAR | Status: DC | PRN

## 2016-01-15 MED ORDER — SODIUM CHLORIDE 0.9 % IV SOLN: INTRAVENOUS | Status: AC

## 2016-01-15 MED ORDER — HEPARIN SODIUM (PORCINE) 5000 UNIT/ML IJ SOLN
4000.0000 [IU] | INTRAMUSCULAR | Status: AC
  Administered 2016-01-15: 4000 [IU] via INTRAVENOUS

## 2016-01-15 MED ORDER — ACETAMINOPHEN 325 MG PO TABS: 650.0000 mg | ORAL_TABLET | ORAL | Status: DC | PRN

## 2016-01-15 MED ORDER — IOPAMIDOL (ISOVUE-370) INJECTION 76%
INTRAVENOUS | Status: AC | PRN
  Administered 2016-01-15: 105 mL via INTRA_ARTERIAL

## 2016-01-15 MED ORDER — SODIUM CHLORIDE 0.9 % IV SOLN: 250.0000 mL | INTRAVENOUS | Status: DC | PRN

## 2016-01-15 MED ORDER — ROSUVASTATIN CALCIUM 10 MG PO TABS: 10.0000 mg | ORAL_TABLET | Freq: Every day | ORAL | Status: DC

## 2016-01-15 MED ORDER — SODIUM CHLORIDE 0.9 % IV SOLN
10.0000 mL/h | INTRAVENOUS | Status: DC
  Administered 2016-01-15: 20 mL/h via INTRAVENOUS

## 2016-01-15 MED ORDER — HEPARIN (PORCINE) IN NACL 2-0.9 UNIT/ML-% IJ SOLN
INTRAMUSCULAR | Status: AC
  Filled 2016-01-15: qty 1000

## 2016-01-15 MED ORDER — CLOPIDOGREL BISULFATE 75 MG PO TABS
75.0000 mg | ORAL_TABLET | Freq: Every day | ORAL | Status: DC
  Administered 2016-01-16 – 2016-01-21 (×6): 75 mg via ORAL
  Filled 2016-01-15 (×6): qty 1

## 2016-01-15 MED ORDER — ASPIRIN 81 MG PO CHEW
324.0000 mg | CHEWABLE_TABLET | Freq: Once | ORAL | Status: AC
  Administered 2016-01-15: 162 mg via ORAL

## 2016-01-15 MED ORDER — IOPAMIDOL (ISOVUE-370) INJECTION 76%
INTRAVENOUS | Status: AC
  Filled 2016-01-15: qty 125

## 2016-01-15 MED ORDER — ASPIRIN 300 MG RE SUPP: 300.0000 mg | RECTAL | Status: AC

## 2016-01-15 MED ORDER — LIDOCAINE HCL (PF) 1 % IJ SOLN
INTRAMUSCULAR | Status: AC
  Filled 2016-01-15: qty 30

## 2016-01-15 MED ORDER — LABETALOL HCL 5 MG/ML IV SOLN
10.0000 mg | INTRAVENOUS | Status: DC | PRN
  Filled 2016-01-15: qty 4

## 2016-01-15 MED ORDER — HEPARIN SODIUM (PORCINE) 5000 UNIT/ML IJ SOLN
5000.0000 [IU] | Freq: Three times a day (TID) | INTRAMUSCULAR | Status: DC
  Administered 2016-01-15 – 2016-01-21 (×18): 5000 [IU] via SUBCUTANEOUS
  Filled 2016-01-15 (×17): qty 1

## 2016-01-15 MED ORDER — HYDRALAZINE HCL 20 MG/ML IJ SOLN
10.0000 mg | Freq: Four times a day (QID) | INTRAMUSCULAR | Status: DC | PRN
  Administered 2016-01-15: 10 mg via INTRAVENOUS
  Filled 2016-01-15: qty 1

## 2016-01-15 SURGICAL SUPPLY — 12 items
CATH INFINITI JR4 5F (CATHETERS) ×1 IMPLANT
CATH SITESEER 5F MULTI A 2 (CATHETERS) ×1 IMPLANT
CATH VISTA GUIDE 6FR XBLAD3.5 (CATHETERS) ×1 IMPLANT
DEVICE RAD COMP TR BAND LRG (VASCULAR PRODUCTS) ×1 IMPLANT
GLIDESHEATH SLEND SS 6F .021 (SHEATH) ×1 IMPLANT
GUIDEWIRE INQWIRE 1.5J.035X260 (WIRE) IMPLANT
INQWIRE 1.5J .035X260CM (WIRE) ×2
KIT ENCORE 26 ADVANTAGE (KITS) ×1 IMPLANT
KIT HEART LEFT (KITS) ×2 IMPLANT
PACK CARDIAC CATHETERIZATION (CUSTOM PROCEDURE TRAY) ×2 IMPLANT
TRANSDUCER W/STOPCOCK (MISCELLANEOUS) ×2 IMPLANT
TUBING CIL FLEX 10 FLL-RA (TUBING) ×2 IMPLANT

## 2016-01-15 NOTE — ED Provider Notes (Addendum)
Carl Stephenson DEPT Provider Note   CSN: [id number] Arrival date & time: 01/15/16  [id number]  By signing my name below, I, Ephriam Jenkins, attest that this documentation has been prepared under the direction and in the presence of Varney Biles, MD. Electronically signed, Ephriam Jenkins, ED Scribe. 01/15/16. 5:18 AM.  History   Chief Complaint No chief complaint on file.  HPI HPI Comments: LEVEL 5 CAVEAT DUE TO MENTAL STATUS Carl Stephenson is a 71 y.o. male, with Hx of DM, CVA, Cancer, MI, brought in by ambulance, who presents to the Emergency Department s/p being found unresponsive by pt's wife just prior to arrival. Pt brought in from [facility] with altered mental status and EKG changes with probable STEMI. Per EMS, pt was found unresponsive in his recliner at home by his wife tonight who called EMS. On EMS arrival pt was still unresponsive and was not oriented or speaking to paramedics. Pt was last seen normal at 2330 yesterday, approximately 5 hours ago. Since arriving to the ED pt has only spoken a few words and is not responding appropriately to commands. Pt's CBG on EMS arrival was 250, checked twice in route. He has not complained of any chest pain since this current episode started. He is still denying any chest pain here in the ED.  His wife and daughter were called and they are both on the way.   The history is provided by the patient. No language interpreter was used.    Past Medical History:  Diagnosis Date  . Acute encephalopathy admitted 12/28/2013  . Bladder tumor    Recurrent  . Cancer [facility])    "had TURBT; cancer free since ~ 11/2013/MD" (12/27/2013)  . Chronic lower back pain   . Coronary artery disease    Cath 12/27/2013 EF 35-40%, 80-90% prox LAD treated with balloon angioplasty and 3.0x28 mm Xience DES, LCx with anomalous origin, 50-60% stenosis in OM, 50% stenosis in PDA and PLA  . Diabetes mellitus type II   . GERD (gastroesophageal reflux disease)   . History of  hiatal hernia   . Hyperlipidemia   . Hypertension   . Inferior myocardial infarction ([place]) 1994   Inferior, PCI and Streptokinase  . Ischemic chest pain ([place]) 12/27/2013  . Kidney stones    "passed them all" (12/27/2013)  . Obesity   . Stroke [facility]) ~ 2012   "left him weaker on LLE"  . TIA (transient ischemic attack) "several"  . Tobacco user    Quit 1994  . Wide-complex tachycardia [facility])     Patient Active Problem List   Diagnosis Date Noted  . LBBB (left bundle branch block)   . Coronary artery disease involving coronary bypass graft of native heart with angina pectoris ([place])   . ECG abnormality   . History of CVA (cerebrovascular accident)   . Lactic acidosis   . Convulsions ([place])   . Hypotension 01/16/2014  . Acute CVA (cerebrovascular accident) ([place]) 12/29/2013  . Acute encephalopathy 12/28/2013  . Elevated troponin 12/28/2013  . Ischemic chest pain ([place]) 12/27/2013  . Seborrheic dermatitis 10/04/2012  . Microcytosis 11/07/2011  . Aphasia 06/26/2011  . 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 Cerebral thrombosis with cerebral infarction ([place]) 08/14/2010  . Temporary cerebral vascular dysfunction 10/24/2009  . BENIGN NEOPLASM OF BLADDER 03/16/2008  . Hyperlipidemia 03/16/2008  . Obesity 03/16/2008  . HYPERTENSION, BENIGN 03/16/2008  . MYOCARDIAL INFARCTION 03/16/2008  . CAD, NATIVE VESSEL 03/16/2008  . UNSPECIFIED PAROXYSMAL TACHYCARDIA 03/16/2008  . GERD 03/16/2008  .  Diabetes mellitus ([place]) 03/16/2008    Past Surgical History:  Procedure Laterality Date  . CARDIAC CATHETERIZATION     "he's had a couple before today where they just looked around" (12/27/2013)  . CARDIAC CATHETERIZATION N/A 01/15/2016   Procedure: Left Heart Cath and Coronary Angiography;  Surgeon: Peter M Martinique, MD;  Location: [place] CV LAB;  Service: Cardiovascular;  Laterality: N/A;  . CORONARY ANGIOPLASTY WITH STENT PLACEMENT     "he's had 3-4 stents put in before today"     . CORONARY ANGIOPLASTY WITH STENT PLACEMENT  12/27/2013   "1"  . LEFT HEART CATHETERIZATION WITH CORONARY ANGIOGRAM N/A 10/02/2011   Procedure: LEFT HEART CATHETERIZATION WITH CORONARY ANGIOGRAM;  Surgeon: [name] Bow, MD;  Location: [facility] CATH LAB;  Service: Cardiovascular;  Laterality: N/A;  . LEFT HEART CATHETERIZATION WITH CORONARY ANGIOGRAM N/A 12/27/2013   Procedure: LEFT HEART CATHETERIZATION WITH CORONARY ANGIOGRAM;  Surgeon: Blane Ohara, MD;  Location: [facility] CATH LAB;  Service: Cardiovascular;  Laterality: N/A;  . TRANSURETHRAL RESECTION OF BLADDER TUMOR WITH GYRUS (TURBT-GYRUS)  "several times"       Home Medications    Prior to Admission medications   Medication Sig Start Date End Date Taking? Authorizing Provider  aspirin EC 325 MG tablet Take 1 tablet (325 mg total) by mouth daily. 01/16/14  Yes Imogene Burn, PA-C  carvedilol (COREG) 6.25 MG tablet Take 1 tablet (6.25 mg total) by mouth 2 (two) times daily with a meal. 12/31/13  Yes Barton Dubois, MD  clopidogrel (PLAVIX) 75 MG tablet Take 75 mg by mouth daily.   Yes Historical Provider, MD  fenofibrate 160 MG tablet Take 160 mg by mouth daily.  12/31/13  Yes Historical Provider, MD  fexofenadine (ALLEGRA) 180 MG tablet Take 180 mg by mouth daily.   Yes Historical Provider, MD  insulin aspart (NOVOLOG FLEXPEN) 100 UNIT/ML FlexPen Inject 10 Units into the skin daily before supper.   Yes Historical Provider, MD  insulin detemir (LEVEMIR) 100 UNIT/ML injection Inject 0.4 mLs (40 Units total) into the skin daily with supper. Patient taking differently: Inject 65 Units into the skin daily with supper.  12/31/13  Yes Barton Dubois, MD  lisinopril (PRINIVIL,ZESTRIL) 10 MG tablet TAKE 1 TABLET BY MOUTH DAILY 07/27/14  Yes Sherren Mocha, MD  pantoprazole (PROTONIX) 40 MG tablet Take 40 mg by mouth daily.     Yes Historical Provider, MD  sertraline (ZOLOFT) 100 MG tablet Take 100 mg by mouth daily.   Yes Historical Provider, MD   triamcinolone (KENALOG) 0.1 % paste Use as directed 1 application in the mouth or throat 2 (two) times daily as needed (wound care (mouth)).   Yes Historical Provider, MD  nitroGLYCERIN (NITROSTAT) 0.4 MG SL tablet Place 1 tablet (0.4 mg total) under the tongue every 5 (five) minutes as needed. May repeat for up to 3 doses. Patient taking differently: Place 0.4 mg under the tongue every 5 (five) minutes as needed for chest pain. May repeat for up to 3 doses. 12/14/13   Sherren Mocha, MD  rosuvastatin (CRESTOR) 10 MG tablet Take 1 tablet (10 mg total) by mouth daily at 6 PM. Patient not taking: Reported on 01/15/2016 12/28/13   Almyra Deforest, PA    Family History Family History  Problem Relation Age of Onset  . Coronary artery disease Other     Social History Social History  Substance Use Topics  . Smoking status: Former Smoker    Packs/day: 3.00    Years:  22.00    Types: Cigarettes    Quit date: 08/12/1992  . Smokeless tobacco: Never Used  . Alcohol use No   Allergies   Nitrofurantoin monohyd macro   Review of Systems Review of Systems  Unable to perform ROS: Mental status change      Physical Exam Updated Vital Signs BP (!) 127/59   Pulse (!) 52   Temp 98.7 F (37.1 C) (Oral)   Resp 16   Ht 5\' 6"  (1.676 m)   Wt 194 lb 7.1 oz (88.2 kg)   SpO2 98%   BMI 31.38 kg/m   Physical Exam  Constitutional: He appears well-developed and well-nourished. No distress.  HENT:  Head: Normocephalic and atraumatic.  Neck: Normal range of motion.  Cardiovascular: Normal rate and intact distal pulses.   Pulmonary/Chest: Effort normal.  Abdominal: Soft.  Neurological:  confused  Skin: Skin is warm and dry. He is not diaphoretic.  Psychiatric: He has a normal mood and affect. Judgment normal.  Nursing note and vitals reviewed.    ED Treatments / Results  DIAGNOSTIC STUDIES: Oxygen Saturation is 97% on RA, normal by my interpretation.  Labs (all labs ordered are listed, but only  abnormal results are displayed) Labs Reviewed  CBC - Abnormal; Notable for the following:       Result Value   MCV 77.7 (*)    MCH 25.8 (*)    All other components within normal limits  COMPREHENSIVE METABOLIC PANEL - Abnormal; Notable for the following:    CO2 20 (*)    Glucose, Bld 258 (*)    BUN 22 (*)    Creatinine, Ser 1.39 (*)    Calcium 8.7 (*)    Total Protein 6.1 (*)    Albumin 3.3 (*)    ALT 13 (*)    GFR calc non Af Amer 49 (*)    GFR calc Af Amer 57 (*)    All other components within normal limits  TROPONIN I - Abnormal; Notable for the following:    Troponin I 0.08 (*)    All other components within normal limits  LIPID PANEL - Abnormal; Notable for the following:    Cholesterol 214 (*)    Triglycerides 283 (*)    HDL 30 (*)    VLDL 57 (*)    LDL Cholesterol 127 (*)    All other components within normal limits  GLUCOSE, CAPILLARY - Abnormal; Notable for the following:    Glucose-Capillary 245 (*)    All other components within normal limits  URINALYSIS, ROUTINE W REFLEX MICROSCOPIC - Abnormal; Notable for the following:    Specific Gravity, Urine 1.045 (*)    Glucose, UA >=500 (*)    Hgb urine dipstick SMALL (*)    Protein, ur 100 (*)    All other components within normal limits  AMMONIA - Abnormal; Notable for the following:    Ammonia 38 (*)    All other components within normal limits  GLUCOSE, CAPILLARY - Abnormal; Notable for the following:    Glucose-Capillary 210 (*)    All other components within normal limits  GLUCOSE, CAPILLARY - Abnormal; Notable for the following:    Glucose-Capillary 155 (*)    All other components within normal limits  GLUCOSE, CAPILLARY - Abnormal; Notable for the following:    Glucose-Capillary 175 (*)    All other components within normal limits  I-STAT CHEM 8, ED - Abnormal; Notable for the following:    BUN 24 (*)  Creatinine, Ser 1.50 (*)    Glucose, Bld 255 (*)    Hemoglobin 12.9 (*)    HCT 38.0 (*)    All  other components within normal limits  I-STAT CG4 LACTIC ACID, ED - Abnormal; Notable for the following:    Lactic Acid, Venous 4.62 (*)    All other components within normal limits  I-STAT TROPOININ, ED - Abnormal; Notable for the following:    Troponin i, poc 0.09 (*)    All other components within normal limits  MRSA PCR SCREENING  CULTURE, BLOOD (ROUTINE X 2)  CULTURE, BLOOD (ROUTINE X 2)  DIFFERENTIAL  PROTIME-INR  APTT  LACTIC ACID, PLASMA  LACTIC ACID, PLASMA  RAPID URINE DRUG SCREEN, HOSP PERFORMED  MAGNESIUM  ETHANOL  CBC  BASIC METABOLIC PANEL    EKG  EKG Interpretation None       Radiology Ct Head Wo Contrast  Result Date: 01/15/2016 CLINICAL DATA:  Code STEMI. Altered mental status. Assess for hemorrhage. History of hypertension, hyperlipidemia, cancer, stroke. EXAM: CT HEAD WITHOUT CONTRAST TECHNIQUE: Contiguous axial images were obtained from the base of the skull through the vertex without intravenous contrast. COMPARISON:  CT HEAD July 17, 2015 and MRI head December 29, 2013 FINDINGS: BRAIN: The ventricles and sulci are normal for age. No intraparenchymal hemorrhage, mass effect nor midline shift. Patchy to confluent supratentorial and pontine white matter hypodensities. Chronic appearing bilateral thalamus and basal ganglia lacunar infarcts. No acute large vascular territory infarcts. No abnormal extra-axial fluid collections. Basal cisterns are patent. VASCULAR: Moderate calcific atherosclerosis of the carotid siphons. SKULL: No skull fracture. No significant scalp soft tissue swelling. SINUSES/ORBITS: The mastoid air-cells and included paranasal sinuses are well-aerated. Status post LEFT ocular lens implant. The included ocular globes and orbital contents are non-suspicious. OTHER: None. IMPRESSION: No acute intracranial process. Chronic changes including moderate to severe chronic small vessel ischemic disease and old deep gray nuclei lacunar infarcts. Electronically  Signed   By: Elon Alas M.D.   On: 01/15/2016 04:57    Procedures .Critical Care Performed by: Varney Biles Authorized by: Varney Biles     (including critical care time)  Medications Ordered in ED Medications  sodium chloride flush (NS) 0.9 % injection 3 mL (not administered)  acetaminophen (TYLENOL) tablet 650 mg (not administered)  ondansetron (ZOFRAN) injection 4 mg (not administered)  heparin injection 5,000 Units (5,000 Units Subcutaneous Given 01/15/16 2132)  aspirin chewable tablet 81 mg (81 mg Oral Not Given 01/15/16 1000)  clopidogrel (PLAVIX) tablet 75 mg (75 mg Oral Not Given 01/15/16 0800)  hydrALAZINE (APRESOLINE) injection 10 mg (10 mg Intravenous Given 01/15/16 0744)  pantoprazole (PROTONIX) EC tablet 40 mg (40 mg Oral Not Given 01/15/16 1000)  aspirin chewable tablet 324 mg (324 mg Oral Not Given 01/15/16 0715)    Or  aspirin suppository 300 mg ( Rectal See Alternative 01/15/16 0715)  insulin aspart (novoLOG) injection 0-5 Units (0 Units Subcutaneous Not Given 01/15/16 2200)  insulin aspart (novoLOG) injection 0-15 Units (5 Units Subcutaneous Given 01/15/16 1252)  0.9 %  sodium chloride infusion ( Intravenous Rate/Dose Change 01/15/16 0747)  carvedilol (COREG) tablet 6.25 mg (6.25 mg Oral Given 01/15/16 1751)  labetalol (NORMODYNE,TRANDATE) injection 10 mg (not administered)  levETIRAcetam (KEPPRA) tablet 500 mg (500 mg Oral Given 01/15/16 2132)  aspirin chewable tablet 324 mg (162 mg Oral Given 01/15/16 0421)  heparin injection 4,000 Units (4,000 Units Intravenous Given 01/15/16 0420)  levETIRAcetam (KEPPRA) 1,000 mg in sodium chloride 0.9 % 100 mL  IVPB (1,000 mg Intravenous Given 01/15/16 1841)     Initial Impression / Assessment and Plan / ED Course  I have reviewed the triage vital signs and the nursing notes.  Pertinent labs & imaging results that were available during my care of the patient were reviewed by me and considered in my medical decision making (see chart for  details).  Clinical Course    PT comes in via EMS with CODE STEMI activation from the field. Pt was unresponsive at EMS arrival, he is more responsive, but still confused. He has no complains, but not reliable historian.  EKG was accepted as STEMI - but I called Dr. Martinique and went over the presentation, as I suspect that there is possibly more than ACS process.  Intact distal pulses - doubt dissection. Stroke, Seizure, Dysrhythmias are other possibilities. PT take to the cath lab after CT ruled out brain bleed.   No family present initially, so we dont have a full clinical picture. CRITICAL CARE Performed by: Varney Biles   Total critical care time: 40 minutes  Critical care time was exclusive of separately billable procedures and treating other patients.  Critical care was necessary to treat or prevent imminent or life-threatening deterioration.  Critical care was time spent personally by me on the following activities: development of treatment plan with patient and/or surrogate as well as nursing, discussions with consultants, evaluation of patient's response to treatment, examination of patient, obtaining history from patient or surrogate, ordering and performing treatments and interventions, ordering and review of laboratory studies, ordering and review of radiographic studies, pulse oximetry and re-evaluation of patient's condition.   Final Clinical Impressions(s) / ED Diagnoses   Final diagnoses:  Encephalopathy acute  Abnormal EKG    New Prescriptions Current Discharge Medication List     I personally performed the services described in this documentation, which was scribed in my presence. The recorded information has been reviewed and is accurate.     Varney Biles, MD 01/15/16 [id number]    Varney Biles, MD 01/15/16 2238

## 2016-01-15 NOTE — Consult Note (Signed)
PULMONARY / CRITICAL CARE MEDICINE   Name: Carl Stephenson MRN: [id number] DOB: [birth year]    ADMISSION DATE:  01/15/2016 CONSULTATION DATE:  01/15/16  REFERRING MD:  Dr. Martinique  CHIEF COMPLAINT:  Altered mental status   HISTORY OF PRESENT ILLNESS:   Carl Stephenson is a 71yo man with PMHx of CAD with prior MI and PCI, type 2 DM, prior CVA in 2015, and bladder cancer who presented to the ED on 01/15/16 after being found unresponsive at home. Per family he went to the bathroom tonight and when he got back he sat down and then suddenly moaned out and sprawled out his arms and legs stiffly with his eyes rolling to the back of his head. Family denies any whole body shaking. Family states he was unresponsive so EMS was called. He was brought by [facility] with AMS and EKG changes with probable STEMI. Patient not complaining of any chest pain or shortness of breath. Upon arrival to [facility], he was taken to the cardiac cath lab which showed 2 vessel obstructive CAD with diffuse mid-LAD disease and diffuse disease in the 1st OM, multiple stents patent, and a moderately elevated LVEDP. Family reports he has not been acting himself for the last few days. He has been "spacing out" and they will have to redirect him to regain his attention. Family also notes decreased PO intake lately. 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  PAST SURGICAL HISTORY: He  has a past surgical history that includes Transurethral resection of bladder tumor with gyrus (turbt-gyrus) ("several times"); left heart catheterization with coronary angiogram (N/A, 10/02/2011); left heart catheterization with coronary angiogram (N/A, 12/27/2013); Cardiac catheterization; Coronary angioplasty with stent; and Coronary angioplasty with stent (12/27/2013).  Allergies  Allergen Reactions  . Nitrofurantoin Monohyd Macro Hives and Rash    Current Facility-Administered Medications on File Prior to Encounter  Medication  . heparin infusion 2 units/mL in 0.9 % sodium chloride  . iopamidol (ISOVUE-370) 76 % injection  . lidocaine (PF) (XYLOCAINE) 1 % injection  . Radial Cocktail/Verapamil only   Current Outpatient Prescriptions on File Prior to Encounter  Medication Sig  . aspirin EC 325 MG tablet Take 1 tablet (325 mg total) by mouth daily.  . Carboxymethylcellulose Sodium 0.25 % SOLN Place 1 drop into both eyes 5 (five) times daily.  . carvedilol (COREG) 6.25 MG tablet Take 1 tablet (6.25 mg total) by mouth 2 (two) times daily with a meal.  . ezetimibe (ZETIA) 10 MG tablet Take 10 mg by mouth at bedtime.  . fenofibrate 160 MG tablet Take 160 mg by mouth daily.   . fexofenadine (ALLEGRA) 180 MG tablet Take 180 mg by mouth daily.  . insulin aspart (NOVOLOG FLEXPEN) 100 UNIT/ML FlexPen Inject 10 Units into the skin daily before supper.  . insulin detemir (LEVEMIR) 100 UNIT/ML injection Inject 0.4 mLs (40 Units total) into the skin daily with supper.  [name] lisinopril (PRINIVIL,ZESTRIL) 10 MG tablet TAKE 1 TABLET BY MOUTH DAILY  . metFORMIN (  GLUCOPHAGE) 850 MG tablet Take 850 mg by mouth 3 (three) times daily with meals.  . nitroGLYCERIN (NITROSTAT) 0.4 MG SL tablet Place 1 tablet (0.4 mg total) under the tongue every 5 (five) minutes as needed. May repeat for up to 3 doses. (Patient taking differently: Place 0.4 mg under the tongue every 5 (five)  minutes as needed for chest pain. May repeat for up to 3 doses.)  . oxazepam (SERAX) 10 MG capsule Take 10 mg by mouth 3 (three) times daily as needed for anxiety.   . pantoprazole (PROTONIX) 40 MG tablet Take 40 mg by mouth daily.    [name] PARoxetine (PAXIL) 30 MG tablet Take 30 mg by mouth daily.   [name] PRESCRIPTION MEDICATION Place 1 drop into both eyes 5 (five) times daily. Carboxymethylcellulose 0.25% solution  . PRESCRIPTION MEDICATION Place 1 drop into the left eye at bedtime. Carboxymethylcellulose 1% opth gel  . rosuvastatin (CRESTOR) 10 MG tablet Take 1 tablet (10 mg total) by mouth daily at 6 PM.  . solifenacin (VESICARE) 5 MG tablet Take 5 mg by mouth daily.  [name] triamcinolone (KENALOG) 0.1 % paste Use as directed 1 application in the mouth or throat 2 (two) times daily as needed (wound care (mouth)).    FAMILY HISTORY:  His indicated that the status of his other is unknown.    SOCIAL HISTORY: He  reports that he quit smoking about 23 years ago. His smoking use included Cigarettes. He has a 66.00 pack-year smoking history. He has never used smokeless tobacco. He reports that he does not drink alcohol or use drugs.  REVIEW OF SYSTEMS:   Unable to complete full ROS due to AMS  SUBJECTIVE:  Patient denies any fevers, chest pain, SOB, abdominal pain, nausea/vomiting. Able to tell [place] his name, the President's name, and that he is in a hospital. Denies any specific complaint, only that he feels "unwell."   VITAL SIGNS: BP (!) 175/97   Pulse 69   Temp (!) 96.8 F (36 C) (Axillary)   Resp (!) 22   Ht 5\' 6"  (1.676 m)   Wt 194 lb 7.1 oz (88.2 kg)   SpO2 94%   BMI 31.38 kg/m   HEMODYNAMICS:    VENTILATOR SETTINGS:    INTAKE / OUTPUT: No intake/output data recorded.  PHYSICAL EXAMINATION: General: elderly man sitting up in bed, agitated at times Neuro:  Alert and oriented to person and place. Left-sided facial droop- chronic from prior stroke. Follows commands. Lifts arms and  legs off bed with poor effort.  HEENT: EOMI, PERRL, sclera anicteric, mucus membranes dry Cardiovascular:  RRR, no m/g/r Lungs: CTA bilaterally, breaths non-labored Abdomen:  BS+, soft, non-tender Musculoskeletal: Moves all extremities   Skin:  No rashes, bruises, petechiae  LABS:  BMET  Recent Labs Lab 01/15/16 0420  NA 137  138  K 4.9  4.8  CL 106  104  CO2 20*  BUN 22*  24*  CREATININE 1.39*  1.50*  GLUCOSE 258*  255*    Electrolytes  Recent Labs Lab 01/15/16 0420  CALCIUM 8.7*    CBC  Recent Labs Lab 01/15/16 0420  WBC 8.6  HGB 13.2  12.9*  HCT 39.8  38.0*  PLT 172    Coag's  Recent Labs Lab 01/15/16 0420  APTT 31  INR 0.98    Sepsis Markers  Recent Labs Lab 01/15/16 0421  LATICACIDVEN 4.62*    ABG No results for input(s): PHART, PCO2ART, PO2ART in the last 168 hours.  Liver Enzymes  Recent Labs Lab 01/15/16 0420  AST 20  ALT 13*  ALKPHOS 61  BILITOT 0.5  ALBUMIN 3.3*    Cardiac Enzymes  Recent Labs Lab 01/15/16 0420  TROPONINI 0.08*    Glucose No results for input(s): GLUCAP in the last 168 hours.  Imaging Ct Head Wo Contrast  Result Date: 01/15/2016 CLINICAL DATA:  Code STEMI. Altered mental status. Assess for hemorrhage. History of hypertension, hyperlipidemia, cancer, stroke. EXAM: CT HEAD WITHOUT CONTRAST TECHNIQUE: Contiguous axial images were obtained from the base of the skull through the vertex without intravenous contrast. COMPARISON:  CT HEAD July 17, 2015 and MRI head December 29, 2013 FINDINGS: BRAIN: The ventricles and sulci are normal for age. No intraparenchymal hemorrhage, mass effect nor midline shift. Patchy to confluent supratentorial and pontine white matter hypodensities. Chronic appearing bilateral thalamus and basal ganglia lacunar infarcts. No acute large vascular territory infarcts. No abnormal extra-axial fluid collections. Basal cisterns are patent. VASCULAR: Moderate calcific  atherosclerosis of the carotid siphons. SKULL: No skull fracture. No significant scalp soft tissue swelling. SINUSES/ORBITS: The mastoid air-cells and included paranasal sinuses are well-aerated. Status post LEFT ocular lens implant. The included ocular globes and orbital contents are non-suspicious. OTHER: None. IMPRESSION: No acute intracranial process. Chronic changes including moderate to severe chronic small vessel ischemic disease and old deep gray nuclei lacunar infarcts. Electronically Signed   By: Elon Alas M.D.   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 Multiple stents patent including the proximal LAD, ramus intermediate, LCx, and proximal RCA. Moderately elevated LVEDP  CULTURES: None  ANTIBIOTICS: None  SIGNIFICANT EVENTS: 1/3>> Found unresponsive at home. EKG changes with apparent STEMI and taken to cath lab  1/3>> Cath without significant changes   LINES/TUBES: None  DISCUSSION: Mr. Eisenberger is a 71yo man with PMHx of CAD with prior MI and s/p PCI, prior stroke, and type 2 DM presenting with a few day hx of AMS acutely worsening over the last few hours. EMS EKG with apparent STEMI changes and patient went for cardiac cath which did not show a clear culprit to explain his symptoms.   ASSESSMENT / PLAN:  NEUROLOGIC A:   Acute encephalopathy- questionable seizure  Hx CVA/TIAs Hx Depression P:   Will consult Neuro Likely needs MRI but will defer to Neuro- may be difficult to obtain as pt intermittently agitated Will get EEG Sitter at bedside Continue home Paxil   PULMONARY A: No acute issues P:   None  CARDIOVASCULAR A:  STEMI?- reportedly had EKG changes on EMS EKG. No STEMI seen on EKGs here. Cath without significant changes Hx CAD with prior MI s/p PCI HTN Hyperlipidemia P:  Will not trend  trops as no lesions seen on cath Add hydralazine for BP control Continue home Coreg Continue home statin + zetia Echocardiogram Cardiology following  RENAL A:   AKI- likely prerenal due to poor PO intake Lactic acidosis P:   Start NS @ 75 Trend lactate  bmet in AM Clear liquid diet, advance as tolerated  GASTROINTESTINAL A:   Hx GERD P:   Continue home PPI  HEMATOLOGIC A:   Hx bladder cancer, stable No acute issues P:  None  INFECTIOUS A:   No evidence of infection P:   Monitor for fever and WBC trend given AMS  ENDOCRINE  A:   Hx DM   P:   Start moderate ISS Can add home Levemir when eating more consistently    FAMILY  - Updates: Wife, son, and daughter updated at bedside   - Inter-disciplinary family meet or Palliative Care meeting due by: 01/21/16  Attending note to follow.  Albin Felling, MD, MPH Internal Medicine Resident, PGY-III Pager: [phone] 01/15/2016, 6:13 AM   ATTENDING NOTE / ATTESTATION NOTE :   I have discussed the case with the resident/APP.   I agree with the resident/APP's  history, physical examination, assessment, and plans.    I have edited the above note and modified it according to our agreed history, physical examination, assessment and plan.   Briefly, pt admitted after acute unresponsiveness associated with sze-like episode.  EKG was concerning for STEMI.  He had a LHC which did not reveal significant stenosis.  He has since been in the ICU.  Neurology has been consulted. (-) recurrence of "sze-like" activity since being admitted.   Pt seen and examined. BP 160/90, HR 70. RR 20, 98% on 2L. Awake, follows commands. Lethargic. Still confused but not as much. (-) NVD. Good ae. CTA. Good s1/s2. (-) m/r/g. (+) BS, soft, NT. (-) edema.   Labs reviewed. Lactate now normal from 4. CT head cranial was (-) for acute changes.   Assessment/Plan : 1. Unresponsiveness/Sze like activity. Unsure etiology. Could be 2/2 dehydration or occult  infection. Neurology has been consulted.  EEG is pending. Not on sze meds. Will observe for now. Check urine drug screen, etoh level, ammonia. If not better, may need LP.   2. Demand ischemia. LHC with no significant stenosis. Cardiology is primary. Awaiting echo. Cont other meds per Cards.   3. Lactic acidosis, resolved.  Not sure if there is occult infection > will obtain U/A, CXR, blood culture. Holding off on abx for now.    Family :Family updated at length today.  Case discussed with wife and children. Will defer disposition to cardiology re: keeping in ICU vs transfer to SDU.    Monica Becton, MD 01/15/2016, 10:57 AM [facility] Pager [phone] After 3 pm or if no answer, call [phone]

## 2016-01-15 NOTE — Consult Note (Signed)
NEURO HOSPITALIST CONSULT NOTE   Requestig physician: Dr. Martinique   Reason for Consult: seizure   History obtained from:  family  HPI:                                                                                                                                          Carl Stephenson is an 71 y.o. male with no past medical history of seizure, head trauma, or febrile seizure. 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 Family and wife, patient had gotten up to go to the bathroom when suddenly he was noticed to fall down with both arms extended in legs extended, eyes rolling back, foaming at the mouth and bleeding at the mouth. Per wife this lasted for approximately 15 minutes and then patient was postictal and confused afterwards. EMS was called and patient was brought to the ED. On arrival to [facility] was taken to the cardiac cath lab which showed two-vessel obstructive CAD with diffuse mild LAD disease and diffuse disease in the first OM, multiple stents patent, and moderately elevated LVEDP.  In discussing with family patient does not smoke, patient does not drink, patient did not drink on New Year's Eve, patient does not do any illicit drugs. Patient does have issues with fluid behind the eyes and now having some disconjugate gaze twitches was to see a neurologist in the future for an MRI. As stated patient has never had a seizure in the past. Currently patient is alert, he is able to come ease at the hospital but does not have any recollection of what happened. He is having some difficulty following commands secondary to postictal state. EEG was obtained which did not show any overt seizure activity however formal reading is pending.  Currently patient has not received  any benzodiazepines from looking throughout the note and he has not received any antiepileptic medication. Patient has only had 1 seizure at this time.  Past Medical History:  Diagnosis Date  . Acute encephalopathy admitted 12/28/2013  . Bladder tumor    Recurrent  . Cancer [facility])    "had TURBT; cancer free since ~ 11/2013/MD" (12/27/2013)  . Chronic lower back pain   . Coronary artery disease    Cath 12/27/2013 EF 35-40%, 80-90% prox LAD treated with balloon angioplasty and 3.0x28 mm Xience DES, LCx with anomalous origin, 50-60% stenosis in OM, 50% stenosis in PDA and PLA  . Diabetes mellitus type II   . GERD (gastroesophageal reflux disease)   . History of hiatal hernia   .  Hyperlipidemia   . Hypertension   . Inferior myocardial infarction ([place]) 1994   Inferior, PCI and Streptokinase  . Ischemic chest pain ([place]) 12/27/2013  . Kidney stones    "passed them all" (12/27/2013)  . Obesity   . Stroke [facility]) ~ 2012   "left him weaker on LLE"  . TIA (transient ischemic attack) "several"  . Tobacco user    Quit 1994  . Wide-complex tachycardia ([place])     Past Surgical History:  Procedure Laterality Date  . CARDIAC CATHETERIZATION     "he's had a couple before today where they just looked around" (12/27/2013)  . CARDIAC CATHETERIZATION N/A 01/15/2016   Procedure: Left Heart Cath and Coronary Angiography;  Surgeon: Peter M Martinique, MD;  Location: [place] CV LAB;  Service: Cardiovascular;  Laterality: N/A;  . CORONARY ANGIOPLASTY WITH STENT PLACEMENT     "he's had 3-4 stents put in before today"   . CORONARY ANGIOPLASTY WITH STENT PLACEMENT  12/27/2013   "1"  . LEFT HEART CATHETERIZATION WITH CORONARY ANGIOGRAM N/A 10/02/2011   Procedure: LEFT HEART CATHETERIZATION WITH CORONARY ANGIOGRAM;  Surgeon: [name] Bow, MD;  [facility];  Service: Cardiovascular;  Laterality: N/A;  . LEFT HEART CATHETERIZATION WITH CORONARY ANGIOGRAM N/A 12/27/2013   Procedure: LEFT HEART  CATHETERIZATION WITH CORONARY ANGIOGRAM;  Surgeon: Blane Ohara, MD;  Location: [facility] CATH LAB;  Service: Cardiovascular;  Laterality: N/A;  . TRANSURETHRAL RESECTION OF BLADDER TUMOR WITH GYRUS (TURBT-GYRUS)  "several times"    Family History  Problem Relation Age of Onset  . Coronary artery disease Other       Social History:  reports that he quit smoking about 23 years ago. His smoking use included Cigarettes. He has a 66.00 pack-year smoking history. He has never used smokeless tobacco. He reports that he does not drink alcohol or use drugs.  Allergies  Allergen Reactions  . Nitrofurantoin Monohyd Macro Hives and Rash    MEDICATIONS:                                                                                                                     Prior to Admission:  Prescriptions Prior to Admission  Medication Sig Dispense Refill Last Dose  . aspirin EC 325 MG tablet Take 1 tablet (325 mg total) by mouth daily. 30 tablet 0 Past Week at Unknown time  . carvedilol (COREG) 6.25 MG tablet Take 1 tablet (6.25 mg total) by mouth 2 (two) times daily with a meal. 60 tablet 1 Past Week at Unknown time  . clopidogrel (PLAVIX) 75 MG tablet Take 75 mg by mouth daily.   01/14/2016 at Unknown time  . fenofibrate 160 MG tablet Take 160 mg by mouth daily.   1 01/14/2016 at Unknown time  . fexofenadine (ALLEGRA) 180 MG tablet Take 180 mg by mouth daily.   Past Week at Unknown time  . insulin aspart (NOVOLOG FLEXPEN) 100 UNIT/ML FlexPen Inject 10 Units into the skin daily before supper.  Past Week at Unknown time  . insulin detemir (LEVEMIR) 100 UNIT/ML injection Inject 0.4 mLs (40 Units total) into the skin daily with supper. (Patient taking differently: Inject 65 Units into the skin daily with supper. ) 10 mL 11 Past Week at Unknown time  . lisinopril (PRINIVIL,ZESTRIL) 10 MG tablet TAKE 1 TABLET BY MOUTH DAILY 90 tablet 3 Past Week at Unknown time  . pantoprazole (PROTONIX) 40 MG tablet Take 40 mg  by mouth daily.     Past Week at Unknown time  . sertraline (ZOLOFT) 100 MG tablet Take 100 mg by mouth daily.   Past Week at Unknown time  . triamcinolone (KENALOG) 0.1 % paste Use as directed 1 application in the mouth or throat 2 (two) times daily as needed (wound care (mouth)).   Unknown at Unknown  . nitroGLYCERIN (NITROSTAT) 0.4 MG SL tablet Place 1 tablet (0.4 mg total) under the tongue every 5 (five) minutes as needed. May repeat for up to 3 doses. (Patient taking differently: Place 0.4 mg under the tongue every 5 (five) minutes as needed for chest pain. May repeat for up to 3 doses.) 25 tablet 3 Unknown at Unknown  . rosuvastatin (CRESTOR) 10 MG tablet Take 1 tablet (10 mg total) by mouth daily at 6 PM. (Patient not taking: Reported on 01/15/2016) 30 tablet 5 Not Taking at Unknown time   Scheduled: . aspirin  324 mg Oral NOW   Or  . aspirin  300 mg Rectal NOW  . aspirin  81 mg Oral Daily  . carvedilol  6.25 mg Oral BID WC  . clopidogrel  75 mg Oral Q breakfast  . heparin  5,000 Units Subcutaneous Q8H  . insulin aspart  0-15 Units Subcutaneous TID WC  . insulin aspart  0-5 Units Subcutaneous QHS  . pantoprazole  40 mg Oral Daily     ROS:                                                                                                                                       History obtained from Family  General ROS: negative for - chills, fatigue, fever, night sweats, weight gain or weight loss Psychological ROS: negative for - behavioral disorder, hallucinations, memory difficulties, mood swings or suicidal ideation Ophthalmic ROS: negative for - blurry vision, double vision, eye pain or loss of vision ENT ROS: negative for - epistaxis, nasal discharge, oral lesions, sore throat, tinnitus or vertigo Allergy and Immunology ROS: negative for - hives or itchy/watery eyes Hematological and Lymphatic ROS: negative for - bleeding problems, bruising or swollen lymph nodes Endocrine ROS:  negative for - galactorrhea, hair pattern changes, polydipsia/polyuria or temperature intolerance Respiratory ROS: negative for - cough, hemoptysis, shortness of breath or wheezing Cardiovascular ROS: negative for - chest pain, dyspnea on exertion, edema or irregular heartbeat Gastrointestinal ROS: negative for - abdominal pain, diarrhea, hematemesis, nausea/vomiting or stool incontinence Genito-Urinary  ROS: negative for - dysuria, hematuria, incontinence or urinary frequency/urgency Musculoskeletal ROS: negative for - joint swelling or muscular weakness Neurological ROS: as noted in HPI Dermatological ROS: negative for rash and skin lesion changes   Blood pressure (!) 158/61, pulse 82, temperature 98 F (36.7 C), temperature source Axillary, resp. rate (!) 25, height 5\' 6"  (1.676 m), weight 88.2 kg (194 lb 7.1 oz), SpO2 98 %.   Neurologic Examination:                                                                                                      HEENT-  Normocephalic, no lesions, without obvious abnormality.  Normal external eye and conjunctiva.  Normal TM's bilaterally.  Normal auditory canals and external ears. Normal external nose, mucus membranes and septum.  Normal pharynx. Cardiovascular- S1, S2 normal, pulses palpable throughout   Lungs- chest clear, no wheezing, rales, normal symmetric air entry Abdomen- normal findings: bowel sounds normal Extremities- no edema Lymph-no adenopathy palpable Musculoskeletal-no joint tenderness, deformity or swelling Skin-patient has a flushed look throughout his face  Neurological Examination Mental Status: Alert, oriented to hospital but nothing else.  Speech is minimal with mild dysarthria likely secondary to the trauma that occurred to his tongue as he has multiple lacerations on both sides of his tongue.  Able to follow simple step commands without difficulty--however this also is somewhat difficult because he becomes agitated at times and  he is still postictal. Cranial Nerves: II: Visual fields grossly normal, pupils equal, round, reactive to light and accommodation III,IV, VI: ptosis not present, extra-ocular motions intact bilaterally V,VII: smile asymmetric on the left however this is old per family, facial light touch sensation normal bilaterally VIII: hearing normal bilaterally IX,X: uvula rises symmetrically XI: bilateral shoulder shrug XII: midline tongue extension Motor: Right : Upper extremity   5/5    Left:     Upper extremity   4/5-old  Lower extremity   5/5     Lower extremity   4/5-old Tone and bulk:normal tone throughout; no atrophy noted Sensory: Pinprick and light touch intact throughout, bilaterally Deep Tendon Reflexes: 2+ and symmetric throughout Plantars: Right: downgoing   Left: downgoing Cerebellar: normal finger-to-nose,and normal heel-to-shin test--however again this was somewhat difficult secondary to confusion and postictal state Gait: Not tested      Lab Results: Basic Metabolic Panel:  Recent Labs Lab 01/15/16 0420  NA 137  138  K 4.9  4.8  CL 106  104  CO2 20*  GLUCOSE 258*  255*  BUN 22*  24*  CREATININE 1.39*  1.50*  CALCIUM 8.7*    Liver Function Tests:  Recent Labs Lab 01/15/16 0420  AST 20  ALT 13*  ALKPHOS 61  BILITOT 0.5  PROT 6.1*  ALBUMIN 3.3*   No results for input(s): LIPASE, AMYLASE in the last 168 hours. No results for input(s): AMMONIA in the last 168 hours.  CBC:  Recent Labs Lab 01/15/16 0420  WBC 8.6  NEUTROABS 6.8  HGB 13.2  12.9*  HCT 39.8  38.0*  MCV 77.7*  PLT 172  Cardiac Enzymes:  Recent Labs Lab 01/15/16 0420  TROPONINI 0.08*    Lipid Panel:  Recent Labs Lab 01/15/16 0418  CHOL 214*  TRIG 283*  HDL 30*  CHOLHDL 7.1  VLDL 57*  LDLCALC 127*    CBG:  Recent Labs Lab 01/15/16 0915  [id number]*    Microbiology: Results for orders placed or performed during the hospital encounter of 01/15/16  MRSA  PCR Screening     Status: None   Collection Time: 01/15/16  5:45 AM  Result Value Ref Range Status   MRSA by PCR NEGATIVE NEGATIVE Final    Comment:        The GeneXpert MRSA Assay (FDA approved for NASAL specimens only), is one component of a comprehensive MRSA colonization surveillance program. It is not intended to diagnose MRSA infection nor to guide or monitor treatment for MRSA infections.     Coagulation Studies:  Recent Labs  01/15/16 0420  LABPROT 13.0  INR 0.98    Imaging: Ct Head Wo Contrast  Result Date: 01/15/2016 CLINICAL DATA:  Code STEMI. Altered mental status. Assess for hemorrhage. History of hypertension, hyperlipidemia, cancer, stroke. EXAM: CT HEAD WITHOUT CONTRAST TECHNIQUE: Contiguous axial images were obtained from the base of the skull through the vertex without intravenous contrast. COMPARISON:  CT HEAD July 17, 2015 and MRI head December 29, 2013 FINDINGS: BRAIN: The ventricles and sulci are normal for age. No intraparenchymal hemorrhage, mass effect nor midline shift. Patchy to confluent supratentorial and pontine white matter hypodensities. Chronic appearing bilateral thalamus and basal ganglia lacunar infarcts. No acute large vascular territory infarcts. No abnormal extra-axial fluid collections. Basal cisterns are patent. VASCULAR: Moderate calcific atherosclerosis of the carotid siphons. SKULL: No skull fracture. No significant scalp soft tissue swelling. SINUSES/ORBITS: The mastoid air-cells and included paranasal sinuses are well-aerated. Status post LEFT ocular lens implant. The included ocular globes and orbital contents are non-suspicious. OTHER: None. IMPRESSION: No acute intracranial process. Chronic changes including moderate to severe chronic small vessel ischemic disease and old deep gray nuclei lacunar infarcts. Electronically Signed   By: Elon Alas M.D.   On: 01/15/2016 04:57       Assessment and plan per attending  neurologist  Etta Quill PA-C [facility] [phone]  01/15/2016, 9:39 AM  I have seen the patient and examined him, and review the above note.  Wife reports episode that is by far most consistent with seizure. He is also had transient episodes of confusion which been previously termed "TIAs" but appear to be stereotyped spells.  On my exam, the patient appears to have possible mild left neglect.   Assessment/Plan:  This is a 71 year old male presenting to [facility] secondary to new onset seizure and abnormal EKG findings. With negative EEG, and improving exam I suspect that his findings are result of a prolonged postictal state. It is possible that he had a prolonged seizure.  Also possible would be that he had an ischemic infarct with seizure at onset.   With a history of stereotyped spells and new onset seizure, I would favor starting antiepileptic therapy.  Recommend: 1) MRI of brain with and without contrast to evaluate for intracranial abnormalities in both setting of seizure and also history of disconjugate movement of eyes. 2) EEG has been obtained and is negative for seizure 3) Keppra 500 mg twice a day following 1 g load    Roland Rack, MD [facility] [phone]  If 7pm- 7am, please page neurology on call as listed  in [place].

## 2016-01-15 NOTE — Progress Notes (Signed)
EEG Completed; Results Pending  

## 2016-01-15 NOTE — Progress Notes (Addendum)
Inpatient Diabetes Program Recommendations  AACE/ADA: New Consensus Statement on Inpatient Glycemic Control (2015)  Target Ranges:  Prepandial:   less than 140 mg/dL      Peak postprandial:   less than 180 mg/dL (1-2 hours)      Critically ill patients:  140 - 180 mg/dL   Lab Results  Component Value Date   GLUCAP 218 (H) 12/31/2013   HGBA1C 7.3 (H) 12/28/2013   Results for Carl Stephenson, Carl Stephenson (MRN [id number]) as of 01/15/2016 08:46  Ref. Range 01/15/2016 04:20 01/15/2016 04:20  Glucose Latest Ref Range: 65 - 99 mg/dL 255 (H) 258 (H)  Results for Carl Stephenson, Carl Stephenson (MRN [id number]) as of 01/15/2016 14:59  Ref. Range 01/15/2016 12:10  Glucose-Capillary Latest Ref Range: 65 - 99 mg/dL 210 (H)   Review of Glycemic Control  Diabetes history: DM2, Obesity, BUN=22, Creatinine=1.39 Outpatient Diabetes medications: Novolog 10 units before supper, Levemir 40 units with supper, Metformin 850 mg TID Current orders for Inpatient glycemic control: Novolog moderate correction 0-15 units TIDAC and 0-5 units QHS  Inpatient Diabetes Program Recommendations:    Please consider half of home Levemir dose (Levemir 20 units daily).  Thank you,  Windy Carina, RN, MSN Diabetes Coordinator Inpatient Diabetes Program [phone] (Team Pager)

## 2016-01-15 NOTE — Procedures (Signed)
ELECTROENCEPHALOGRAM REPORT  Date of Study: 01/15/2016  Patient's Name: Carl Stephenson MRN: [id number] Date of Birth: 05/01/1945  Referring Provider: Dr. Peter Martinique  Clinical History: This is a 71 year old man admitted for unresponsiveness.  Medications: acetaminophen (TYLENOL) tablet 650 mg  aspirin chewable tablet 324 mg  carvedilol (COREG) tablet 6.25 mg  clopidogrel (PLAVIX) tablet 75 mg  darifenacin (ENABLEX) 24 hr tablet 7.5 mg  ezetimibe (ZETIA) tablet 10 mg  hydrALAZINE (APRESOLINE) injection 10 mg  insulin aspart (novoLOG) injection 0-15 Units  insulin aspart (novoLOG) injection 0-5 Units  ondansetron (ZOFRAN) injection 4 mg  pantoprazole (PROTONIX) EC tablet 40 mg  PARoxetine (PAXIL) tablet 30 mg  rosuvastatin (CRESTOR) tablet 10 mg   Technical Summary: A multichannel digital EEG recording measured by the international 10-20 system with electrodes applied with paste and impedances below 5000 ohms performed as portable with EKG monitoring in an awake and drowsy patient.  Hyperventilation and photic stimulation were not performed.  The digital EEG was referentially recorded, reformatted, and digitally filtered in a variety of bipolar and referential montages for optimal display.   Description: The patient is awake and drowsy during the recording.  During maximal wakefulness, there is a asymmetric, medium voltage 7 Hz posterior dominant rhythm better formed over the left occipital region, that poorly attenuates to eye opening and eye closure. This is admixed with a large amount of diffuse 4-5 Hz theta and 2-3 Hz delta slowing of the waking background, with additional focal theta and delta slowing over the right hemisphere. Normal sleep architecture is not seen. Hyperventilation and photic stimulation were not performed.  There were no clear epileptiform discharges or electrographic seizures seen.    EKG lead showed irregular rhythm.  Impression: This awake and drowsy  EEG is abnormal due to the presence of: 1. Moderate diffuse slowing of the waking background 2. Additional focal slowing over the right hemisphere  Clinical Correlation of the above findings indicates diffuse cerebral dysfunction that is non-specific in etiology and can be seen with hypoxic/ischemic injury, toxic/metabolic encephalopathies, neurodegenerative disorders, or medication effect. Additional focal slowing over the right hemisphere indicates focal cerebral dysfunction in this region suggestive of underlying structural or physiologic abnormality. The absence of epileptiform discharges does not rule out a clinical diagnosis of epilepsy.  Clinical correlation is advised.   Ellouise Newer, M.D.

## 2016-01-15 NOTE — Progress Notes (Signed)
   01/15/16 0415  Clinical Encounter Type  Visited With Patient;Family  Visit Type ED  Spiritual Encounters  Spiritual Needs Emotional  Stress Factors  Patient Stress Factors Not reviewed  Family Stress Factors Family relationships  Introduction to Pt. Escorted family to consult B as Pt on way to CT scan then to cath lab. Escorted family to cath lab waiting area.

## 2016-01-15 NOTE — Consult Note (Signed)
Cardiology Consult    Patient ID: Carl Stephenson MRN: [id number], DOB/AGE: [id number]   Admit date: 01/15/2016 Date of Consult: 01/15/2016  Primary Physician: [name], MD Reason for Consult: Unresponsiveness Primary Cardiologist: Dr. Burt Knack Requesting Provider: Varney Biles, MD   History of Present Illness   Carl Stephenson is a 71 y.o. male with past medical history of CAD (s/p multiple PCI to RCA, Cfx, and RI, with most recent DES to LAD in 12/2013), CVA (2015), HTN, and HLD who presented to [facility] ED on 01/15/2016 for evaluation of unresponsiveness.   The patient's wife reports he was sitting on the couch when he all at once "stretched out" and let out a loud, moaning noise. She reports his eyes rolled back and he would not respond to her. Extremities were stiff at that time.   EMS was called and upon their arrival he was disoriented and unresponsive. His initial EKG showed acute ST changes and CODE STEMI was activated.   He was taken for emergent cardiac cath which showed 2 vessel CAD with diffuse 80% mid-LAD stenosis, 85% 1st Mrg stenosis, 40% mid-RCA stenosis, 60% RPDA 1 and 2 lesions, and 40% distal RCA stenosis. Multiple stents were noted to be patent including the proximal LAD, ramus intermediate, LCx, and proximal RCA. No acute lesions were felt to be responsible for his acute presentation.  Labs show a WBC of 8.6, Hgb 13.2, and platelets 172. Na+ 137. K+ 4.9. Creatinine 1.39. Lactic Acid 4.62. Initial troponin at 0.08. CT Head showed chronic changes including moderate to severe chronic small vessel ischemic disease with old gray nuclei lacunar infarcts with no acute intracranial processes.   The family reports he had been in his usual state of health until this episode. No recent complains of chest discomfort, palpitations, or dyspnea with exertion. Compliant with his medications.    Past Medical History   Past Medical History:  Diagnosis Date  . Acute  encephalopathy admitted 12/28/2013  . Bladder tumor    Recurrent  . Cancer [facility])    "had TURBT; cancer free since ~ 11/2013/MD" (12/27/2013)  . Chronic lower back pain   . Coronary artery disease    Cath 12/27/2013 EF 35-40%, 80-90% prox LAD treated with balloon angioplasty and 3.0x28 mm Xience DES, LCx with anomalous origin, 50-60% stenosis in OM, 50% stenosis in PDA and PLA  . Diabetes mellitus type II   . GERD (gastroesophageal reflux disease)   . History of hiatal hernia   . Hyperlipidemia   . Hypertension   . 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 Ischemic chest pain ([place]) 12/27/2013  . Kidney stones    "passed them all" (12/27/2013)  . Obesity   . Stroke [facility]) ~ 2012   "left him weaker on LLE"  . TIA (transient ischemic attack) "several"  . Tobacco user    Quit 1994  . Wide-complex tachycardia ([place])     Past Surgical History:  Procedure Laterality Date  . CARDIAC CATHETERIZATION     "he's had a couple before today where they just looked around" (12/27/2013)  . CORONARY ANGIOPLASTY WITH STENT PLACEMENT     "he's had 3-4 stents put in before today"   . CORONARY ANGIOPLASTY WITH STENT PLACEMENT  12/27/2013   "1"  . LEFT HEART CATHETERIZATION WITH CORONARY ANGIOGRAM N/A 10/02/2011   Procedure: LEFT HEART CATHETERIZATION WITH CORONARY ANGIOGRAM;  Surgeon: [name] Bow, MD;  Location: [facility] CATH LAB;  Service:  Cardiovascular;  Laterality: N/A;  . LEFT HEART CATHETERIZATION WITH CORONARY ANGIOGRAM N/A 12/27/2013   Procedure: LEFT HEART CATHETERIZATION WITH CORONARY ANGIOGRAM;  Surgeon: Blane Ohara, MD;  Location: [facility] CATH LAB;  Service: Cardiovascular;  Laterality: N/A;  . TRANSURETHRAL RESECTION OF BLADDER TUMOR WITH GYRUS (TURBT-GYRUS)  "several times"     Allergies  Allergies  Allergen Reactions  . Nitrofurantoin Monohyd Macro Hives and Rash    Inpatient Medications    . aspirin  81 mg Oral Daily  . clopidogrel  75 mg  Oral Q breakfast  . heparin  5,000 Units Subcutaneous Q8H  . sodium chloride flush  3 mL Intravenous Q12H  . sodium chloride flush  3 mL Intravenous Q12H    Family History    Family History  Problem Relation Age of Onset  . Coronary artery disease Other     Social History    Social History   Social History  . Marital status: Married    Spouse name: N/A  . Number of children: N/A  . Years of education: N/A   Occupational History  . 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 Smoking status: Former Smoker    Packs/day: 3.00    Years: 22.00    Types: Cigarettes    Quit date: 08/12/1992  . Smokeless tobacco: Never Used  . Alcohol use No  . Drug use: No  . Sexual activity: Not on file   Other Topics Concern  . Not on file   Social History Narrative   Married    2 grown children     Review of Systems    Alert and Oriented to Person. Unable to obtain full review of systems.   Physical Exam    Blood pressure (!) 175/97, pulse 69, temperature (!) 96.8 F (36 C), temperature source Axillary, resp. rate (!) 22, height 5\' 6"  (1.676 m), weight 194 lb 7.1 oz (88.2 kg), SpO2 94 %.  General: Pleasant, elderly Caucasian male, moving sporadically in bed. Psych: Normal affect. Neuro: Alert and oriented X 1 (person). Moves all extremities spontaneously. HEENT: Normal  Neck: Supple without bruits or JVD. Lungs:  Resp regular and unlabored, CTA without wheezing or rales. Heart: RRR no s3, s4, or murmurs. Right radial TR band in place.  Abdomen: Soft, non-tender, non-distended, BS + x 4.  Extremities: No clubbing, cyanosis or edema. DP/PT/Radials 2+ and equal bilaterally.  Labs    Troponin [facility])  Recent Labs  01/15/16 0418  TROPIPOC 0.09*    Recent Labs  01/15/16 0420  TROPONINI 0.08*   Lab Results  Component Value Date   WBC 8.6 01/15/2016   HGB 13.2 01/15/2016   HGB 12.9 (L) 01/15/2016   HCT 39.8  01/15/2016   HCT 38.0 (L) 01/15/2016   MCV 77.7 (L) 01/15/2016   PLT 172 01/15/2016    Recent Labs Lab 01/15/16 0420  NA 137  138  K 4.9  4.8  CL 106  104  CO2 20*  BUN 22*  24*  CREATININE 1.39*  1.50*  CALCIUM 8.7*  PROT 6.1*  BILITOT 0.5  ALKPHOS 61  ALT 13*  AST 20  GLUCOSE 258*  255*   Lab Results  Component Value Date   CHOL 214 (H) 01/15/2016   HDL 30 (L) 01/15/2016   LDLCALC 127 (H) 01/15/2016   TRIG 283 (H) 01/15/2016   No results found for: [facility]   Radiology Studies  Ct Head Wo Contrast  Result Date: 01/15/2016 CLINICAL DATA:  Code STEMI. Altered mental status. Assess for hemorrhage. History of hypertension, hyperlipidemia, cancer, stroke. EXAM: CT HEAD WITHOUT CONTRAST TECHNIQUE: Contiguous axial images were obtained from the base of the skull through the vertex without intravenous contrast. COMPARISON:  CT HEAD July 17, 2015 and MRI head December 29, 2013 FINDINGS: BRAIN: The ventricles and sulci are normal for age. No intraparenchymal hemorrhage, mass effect nor midline shift. Patchy to confluent supratentorial and pontine white matter hypodensities. Chronic appearing bilateral thalamus and basal ganglia lacunar infarcts. No acute large vascular territory infarcts. No abnormal extra-axial fluid collections. Basal cisterns are patent. VASCULAR: Moderate calcific atherosclerosis of the carotid siphons. SKULL: No skull fracture. No significant scalp soft tissue swelling. SINUSES/ORBITS: The mastoid air-cells and included paranasal sinuses are well-aerated. Status post LEFT ocular lens implant. The included ocular globes and orbital contents are non-suspicious. OTHER: None. IMPRESSION: No acute intracranial process. Chronic changes including moderate to severe chronic small vessel ischemic disease and old deep gray nuclei lacunar infarcts. Electronically Signed   By: Elon Alas M.D.   On: 01/15/2016 04:57    EKG & Cardiac Imaging    EKG: NSR, HR 76, with  LBBB and frequent PVC's.   Echocardiogram: 12/29/2013 Study Conclusions  - Left ventricle: The cavity size was normal. Wall thickness was increased in a pattern of mild LVH. Systolic function was mildly reduced. The estimated ejection fraction was in the range of 45% to 50%. There is mild hypokinesis of the basal-midinferior myocardium. Doppler parameters are consistent with abnormal left ventricular relaxation (grade 1 diastolic dysfunction).   Cardiac Catheterization: [id number]  LV end diastolic pressure is moderately elevated.  Proximal LAD lesion, 0 %stenosed at prior stent.  Mid LAD to Dist LAD lesion, 80 %stenosed.  Ramus lesion, 0 %stenosed at prior stent  Ost RCA to Mid RCA lesion, 20 %stenosed at prior stent.  Mid RCA lesion, 40 %stenosed.  RPDA-2 lesion, 60 %stenosed.  RPDA-1 lesion, 60 %stenosed.  Dist RCA lesion, 40 %stenosed.  1st Mrg lesion, 85 %stenosed.  Ost Cx to Mid Cx lesion, 10 %stenosed at prior stent.   1. 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 2 vessel obstructive CAD with diffuse mid LAD disease and diffuse disease in the first OM from the anomalous LCx 3. Multiple stents patent including the proximal LAD, ramus intermediate, LCx, and proximal RCA 4. Moderately elevated LVEDP  Plan: patient has no clear culprit for his acute event. The LAD was diffusely diseased on prior study in 2015. The OM is worse than prior but still has TIMI 3 flow and is a small branch. The etiology of his acute unresponsiveness and encephalopathy remain unclear. Will admit for further evaluation with Critical care.    Assessment & Plan    1. Unresponsiveness/ Abnormal EKG - became acutely unresponsive this morning when his eyes rolled back and extremities were stiff. EMS was called and EKG showed acute abnormalities, therefore CODE STEMI was activated. No pathologic arrhythmias noted during transit. - emergent cath which showed 2 vessel CAD with  diffuse 80% mid-LAD stenosis, 85% 1st Mrg stenosis, 40% mid-RCA stenosis, 60% RPDA 1 and 2 lesions, and 40% distal RCA stenosis with no acute lesions felt to be responsible for his acute presentation. - CT Head showed chronic changes including moderate to severe chronic small vessel ischemic disease with old gray nuclei lacunar infarcts with no acute intracranial processes.  - Lactic Acid elevated at 4.62. -  would evaluate for other causes of his acute presentation in the setting of no acute changes on cath. Neurology consult recommended.   2. CAD - s/p multiple PCI to RCA, Cfx, and RI, with most recent DES to LAD in 12/2013. - cath today showing 2 vessel CAD with diffuse 80% mid-LAD stenosis, 85% 1st Mrg stenosis, 40% mid-RCA stenosis, 60% RPDA 1 and 2 lesions, and 40% distal RCA stenosis. Multiple stents were noted to be patent including the proximal LAD, ramus intermediate, LCx, and proximal RCA.  - continue ASA, statin, and Zetia.   3. HTN - On Lisinopril PTA. Would hold in the setting of AKI.  - will order IV Hydralazine now until able to safely take PO medications.   4. HLD - continue statin therapy and Zetia.   5. Type 2 DM  - hold Metformin for 48 hours following catheterization.   Signed, Erma Heritage, PA-C 01/15/2016, 6:23 AM Pager: [phone] Patient seen and examined and history reviewed. Agree with above findings and plan. Patient presented with acute episode of unresponsiveness. Unable to obtain history from patient. EMS Ecg showed marked ST elevation in the anterior leads with new LBBB. Repeat Ecg showed resolution of ST elevation but persistent LBBB. Given mental status changes CT head obtained which showed no IC bleed. He underwent emergent cardiac cath with results noted above. Based on cath findings there is no acute stenosis to explain his presentation. Etiology of encephalopathy is unclear. ? Post tictal. Neurology to see. Appreciate CCM input. Will follow up on  Echo and serial Ecg and troponins. May need diuresis given elevated EDP.   Peter Martinique, [place] 01/15/2016 7:38 AM

## 2016-01-16 ENCOUNTER — Inpatient Hospital Stay (HOSPITAL_COMMUNITY): Payer: Medicare Other

## 2016-01-16 ENCOUNTER — Encounter (HOSPITAL_COMMUNITY): Payer: Self-pay | Admitting: Cardiology

## 2016-01-16 DIAGNOSIS — I517 Cardiomegaly: Secondary | ICD-10-CM

## 2016-01-16 DIAGNOSIS — G9340 Encephalopathy, unspecified: Secondary | ICD-10-CM

## 2016-01-16 DIAGNOSIS — R9431 Abnormal electrocardiogram [ECG] [EKG]: Secondary | ICD-10-CM

## 2016-01-16 DIAGNOSIS — R001 Bradycardia, unspecified: Secondary | ICD-10-CM

## 2016-01-16 DIAGNOSIS — I10 Essential (primary) hypertension: Secondary | ICD-10-CM

## 2016-01-16 DIAGNOSIS — E7800 Pure hypercholesterolemia, unspecified: Secondary | ICD-10-CM

## 2016-01-16 HISTORY — DX: Bradycardia, unspecified: R00.1

## 2016-01-16 LAB — POCT I-STAT 3, ART BLOOD GAS (G3+)
Acid-base deficit: 4 mmol/L — ABNORMAL HIGH (ref 0.0–2.0)
Bicarbonate: 20.6 mmol/L (ref 20.0–28.0)
O2 Saturation: 95 %
PCO2 ART: 36.9 mmHg (ref 32.0–48.0)
PH ART: 7.355 (ref 7.350–7.450)
TCO2: 22 mmol/L (ref 0–100)
pO2, Arterial: 80 mmHg — ABNORMAL LOW (ref 83.0–108.0)

## 2016-01-16 LAB — BASIC METABOLIC PANEL
ANION GAP: 7 (ref 5–15)
BUN: 21 mg/dL — ABNORMAL HIGH (ref 6–20)
CO2: 24 mmol/L (ref 22–32)
Calcium: 8.2 mg/dL — ABNORMAL LOW (ref 8.9–10.3)
Chloride: 107 mmol/L (ref 101–111)
Creatinine, Ser: 1.21 mg/dL (ref 0.61–1.24)
GFR calc non Af Amer: 58 mL/min — ABNORMAL LOW (ref >60)
GLUCOSE: 143 mg/dL — AB (ref 65–99)
POTASSIUM: 3.6 mmol/L (ref 3.5–5.1)
Sodium: 138 mmol/L (ref 135–145)

## 2016-01-16 LAB — CBC
HEMATOCRIT: 36.4 % — AB (ref 39.0–52.0)
HEMOGLOBIN: 12 g/dL — AB (ref 13.0–17.0)
MCH: 25.6 pg — ABNORMAL LOW (ref 26.0–34.0)
MCHC: 33 g/dL (ref 30.0–36.0)
MCV: 77.6 fL — AB (ref 78.0–100.0)
Platelets: 160 10*3/uL (ref 150–400)
RBC: 4.69 MIL/uL (ref 4.22–5.81)
RDW: 14.2 % (ref 11.5–15.5)
WBC: 8.7 10*3/uL (ref 4.0–10.5)

## 2016-01-16 LAB — ECHOCARDIOGRAM COMPLETE
HEIGHTINCHES: 66 in
WEIGHTICAEL: 3104.08 [oz_av]

## 2016-01-16 LAB — BRAIN NATRIURETIC PEPTIDE: B NATRIURETIC PEPTIDE 5: 446.5 pg/mL — AB (ref 0.0–100.0)

## 2016-01-16 LAB — GLUCOSE, CAPILLARY
GLUCOSE-CAPILLARY: 159 mg/dL — AB (ref 65–99)
Glucose-Capillary: 148 mg/dL — ABNORMAL HIGH (ref 65–99)
Glucose-Capillary: 149 mg/dL — ABNORMAL HIGH (ref 65–99)

## 2016-01-16 MED ORDER — INSULIN ASPART 100 UNIT/ML ~~LOC~~ SOLN
0.0000 [IU] | Freq: Every day | SUBCUTANEOUS | Status: DC
  Administered 2016-01-18 – 2016-01-19 (×2): 3 [IU] via SUBCUTANEOUS

## 2016-01-16 MED ORDER — CARVEDILOL 3.125 MG PO TABS
3.1250 mg | ORAL_TABLET | Freq: Two times a day (BID) | ORAL | Status: DC
  Administered 2016-01-16 – 2016-01-20 (×4): 3.125 mg via ORAL
  Filled 2016-01-16 (×7): qty 1

## 2016-01-16 MED ORDER — PERFLUTREN LIPID MICROSPHERE
1.0000 mL | INTRAVENOUS | Status: AC | PRN
  Administered 2016-01-16: 4 mL via INTRAVENOUS
  Filled 2016-01-16: qty 10

## 2016-01-16 MED ORDER — POTASSIUM CHLORIDE CRYS ER 20 MEQ PO TBCR
20.0000 meq | EXTENDED_RELEASE_TABLET | Freq: Every day | ORAL | Status: DC
  Administered 2016-01-16 – 2016-01-18 (×3): 20 meq via ORAL
  Filled 2016-01-16 (×3): qty 1

## 2016-01-16 MED ORDER — FUROSEMIDE 20 MG PO TABS
20.0000 mg | ORAL_TABLET | Freq: Every day | ORAL | Status: DC
  Administered 2016-01-16: 20 mg via ORAL
  Filled 2016-01-16: qty 1

## 2016-01-16 MED ORDER — LISINOPRIL 10 MG PO TABS
10.0000 mg | ORAL_TABLET | Freq: Every day | ORAL | Status: DC
  Administered 2016-01-16 – 2016-01-18 (×3): 10 mg via ORAL
  Filled 2016-01-16 (×3): qty 1

## 2016-01-16 MED ORDER — INSULIN ASPART 100 UNIT/ML ~~LOC~~ SOLN
0.0000 [IU] | Freq: Three times a day (TID) | SUBCUTANEOUS | Status: DC
  Administered 2016-01-17: 2 [IU] via SUBCUTANEOUS
  Administered 2016-01-17 (×2): 3 [IU] via SUBCUTANEOUS
  Administered 2016-01-18: 5 [IU] via SUBCUTANEOUS
  Administered 2016-01-18: 2 [IU] via SUBCUTANEOUS
  Administered 2016-01-18 – 2016-01-20 (×5): 3 [IU] via SUBCUTANEOUS
  Administered 2016-01-20: 8 [IU] via SUBCUTANEOUS
  Administered 2016-01-21: 3 [IU] via SUBCUTANEOUS
  Administered 2016-01-21: 5 [IU] via SUBCUTANEOUS
  Administered 2016-01-21: 3 [IU] via SUBCUTANEOUS

## 2016-01-16 NOTE — Progress Notes (Signed)
Subjective: Patient more awake today and able to follow commands. Still confused about date and year.   Exam: Vitals:   01/16/16 1000 01/16/16 1204  BP: (!) 152/66   Pulse: (!) 48   Resp: 18   Temp:  97.7 F (36.5 C)     Gen: In bed, NAD MS: alert but not oriented. Able to follow simple commands.  CN:2 -12 intact with no disconjugate gaze Motor: moving all extremities 5/5 Sensory: sensory intact with extinction today   Pertinent Labs/Diagnostics: EEG showed no epileptiform activity.  MRI brain PEnding  Etta Quill PA-C [facility] [phone]  Impression: This is a 71 year old male presenting to [facility] secondary to new onset seizure and abnormal EKG findings. With negative EEG, and improving exam I suspect that his findings are result of a prolonged postictal state. It is possible that he had a prolonged seizure.  Also possible would be that he had an ischemic infarct with seizure at onset.    Recommendations: 1) Continue Keppra 500 mg BID 2) awaiting MRI brain  Roland Rack, MD [facility] [phone]  If 7pm- 7am, please page neurology on call as listed in [place].   01/16/2016, 12:21 PM

## 2016-01-16 NOTE — Progress Notes (Addendum)
Patient Name: Carl Stephenson Date of Encounter: 01/16/2016  Primary Cardiologist: Dr. Tyrell Antonio Problem List     Active Problems:   Acute encephalopathy   LBBB (left bundle branch block)   Coronary artery disease involving coronary bypass graft of native heart with angina pectoris (HCC)   ECG abnormality   History of CVA (cerebrovascular accident)   Lactic acidosis   Convulsions ([place])     Subjective   Denies any chest pain or SOB  Inpatient Medications    Scheduled Meds: . aspirin  81 mg Oral Daily  . carvedilol  6.25 mg Oral BID WC  . clopidogrel  75 mg Oral Q breakfast  . heparin  5,000 Units Subcutaneous Q8H  . insulin aspart  0-15 Units Subcutaneous TID WC  . insulin aspart  0-5 Units Subcutaneous QHS  . levETIRAcetam  500 mg Oral BID  . pantoprazole  40 mg Oral Daily   Continuous Infusions:  PRN Meds: acetaminophen, hydrALAZINE, labetalol, ondansetron (ZOFRAN) IV, perflutren lipid microspheres (DEFINITY) IV suspension, sodium chloride flush   Vital Signs    Vitals:   01/16/16 0600 01/16/16 0630 01/16/16 0700 01/16/16 0805  BP: (!) 152/71 (!) 146/65 (!) 149/66   Pulse: (!) 50 (!) 47 (!) 49   Resp: 17 18 17    Temp:    98.3 F (36.8 C)  TempSrc:    Oral  SpO2: 96% 97% 95%   Weight:      Height:        Intake/Output Summary (Last 24 hours) at 01/16/16 0930 Last data filed at 01/16/16 0200  Gross per 24 hour  Intake            817.5 ml  Output              985 ml  Net           -167.5 ml   Filed Weights   01/15/16 0600 01/16/16 0500  Weight: 194 lb 7.1 oz (88.2 kg) 194 lb 0.1 oz (88 kg)    Physical Exam    GEN: Well nourished, well developed, in no acute distress.  HEENT: Grossly normal.  Neck: Supple, no JVD, carotid bruits, or masses. Cardiac: RRR, no murmurs, rubs, or gallops. No clubbing, cyanosis, edema.  Radials/DP/PT 2+ and equal bilaterally.  Respiratory:  Respirations regular and unlabored, clear to auscultation  bilaterally. GI: Soft, nontender, nondistended, BS + x 4. MS: no deformity or atrophy. Skin: warm and dry, no rash. Neuro:  Strength and sensation are intact. Psych: AAOx3.  Normal affect.  Labs    CBC  Recent Labs  01/15/16 0420 01/16/16 0353  WBC 8.6 8.7  NEUTROABS 6.8  --   HGB 13.2  12.9* 12.0*  HCT 39.8  38.0* 36.4*  MCV 77.7* 77.6*  PLT 172 0000000   Basic Metabolic Panel  Recent Labs  01/15/16 0420 01/15/16 1256 01/16/16 0353  NA 137  138  --  138  K 4.9  4.8  --  3.6  CL 106  104  --  107  CO2 20*  --  24  GLUCOSE 258*  255*  --  143*  BUN 22*  24*  --  21*  CREATININE 1.39*  1.50*  --  1.21  CALCIUM 8.7*  --  8.2*  MG  --  2.1  --    Liver Function Tests  Recent Labs  01/15/16 0420  AST 20  ALT 13*  ALKPHOS 61  BILITOT 0.5  PROT  6.1*  ALBUMIN 3.3*   No results for input(s): LIPASE, AMYLASE in the last 72 hours. Cardiac Enzymes  Recent Labs  01/15/16 0420  TROPONINI 0.08*   BNP Invalid input(s): POCBNP D-Dimer No results for input(s): DDIMER in the last 72 hours. Hemoglobin A1C No results for input(s): HGBA1C in the last 72 hours. Fasting Lipid Panel  Recent Labs  01/15/16 0418  CHOL 214*  HDL 30*  LDLCALC 127*  TRIG 283*  CHOLHDL 7.1   Thyroid Function Tests No results for input(s): TSH, T4TOTAL, T3FREE, THYROIDAB in the last 72 hours.  Invalid input(s): FREET3  Telemetry    Sinus bradycardia - Personally Reviewed  ECG    Sinus bradycardia - Personally Reviewed  Radiology    Ct Head Wo Contrast  Result Date: 01/15/2016 CLINICAL DATA:  Code STEMI. Altered mental status. Assess for hemorrhage. History of hypertension, hyperlipidemia, cancer, stroke. EXAM: CT HEAD WITHOUT CONTRAST TECHNIQUE: Contiguous axial images were obtained from the base of the skull through the vertex without intravenous contrast. COMPARISON:  CT HEAD July 17, 2015 and MRI head December 29, 2013 FINDINGS: BRAIN: The ventricles and sulci are  normal for age. No intraparenchymal hemorrhage, mass effect nor midline shift. Patchy to confluent supratentorial and pontine white matter hypodensities. Chronic appearing bilateral thalamus and basal ganglia lacunar infarcts. No acute large vascular territory infarcts. No abnormal extra-axial fluid collections. Basal cisterns are patent. VASCULAR: Moderate calcific atherosclerosis of the carotid siphons. SKULL: No skull fracture. No significant scalp soft tissue swelling. SINUSES/ORBITS: The mastoid air-cells and included paranasal sinuses are well-aerated. Status post LEFT ocular lens implant. The included ocular globes and orbital contents are non-suspicious. OTHER: None. IMPRESSION: No acute intracranial process. Chronic changes including moderate to severe chronic small vessel ischemic disease and old deep gray nuclei lacunar infarcts. Electronically Signed   By: Elon Alas M.D.   On: 01/15/2016 04:57   Dg Chest Port 1 View  Result Date: 01/16/2016 CLINICAL DATA:  Dyspnea, recent heart catheterization EXAM: PORTABLE CHEST 1 VIEW COMPARISON:  07/17/2015 FINDINGS: Cardiac shadow remains enlarged. The lungs are well aerated bilaterally. No significant congestive failure is noted. No acute bony abnormality is seen. IMPRESSION: No acute abnormality noted. Electronically Signed   By: Inez Catalina M.D.   On: 01/16/2016 07:21    Cardiac Studies   Cath 01/15/2016 Conclusion     LV end diastolic pressure is moderately elevated.  Proximal LAD lesion, 0 %stenosed at prior stent.  Mid LAD to Dist LAD lesion, 80 %stenosed.  Ramus lesion, 0 %stenosed at prior stent  Ost RCA to Mid RCA lesion, 20 %stenosed at prior stent.  Mid RCA lesion, 40 %stenosed.  RPDA-2 lesion, 60 %stenosed.  RPDA-1 lesion, 60 %stenosed.  Dist RCA lesion, 40 %stenosed.  1st Mrg lesion, 85 %stenosed.  Ost Cx to Mid Cx lesion, 10 %stenosed at prior stent.   1. 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 2  vessel obstructive CAD with diffuse mid LAD disease and diffuse disease in the first OM from the anomalous LCx 3. Multiple stents patent including the proximal LAD, ramus intermediate, LCx, and proximal RCA 4. Moderately elevated LVEDP  Plan: patient has no clear culprit for his acute event. The LAD was diffusely diseased on prior study in 2015. The OM is worse than prior but still has TIMI 3 flow and is a small branch. The etiology of his acute unresponsiveness and encephalopathy remain unclear.      Patient Profile  Carl Stephenson is a 71 y.o. male with past medical history of CAD (s/p multiple PCI to RCA, Cfx, and RI, with most recent DES to LAD in 12/2013), CVA (2015), HTN, and HLD who presented to [facility] ED on 01/15/2016 for evaluation of unresponsiveness.    Assessment & Plan    1. Unresponsiveness/ Abnormal EKG - became acutely unresponsive on morning of admit when his eyes rolled back and extremities were stiff. EMS was called and EKG showed acute abnormalities, therefore CODE STEMI was activated. No pathologic arrhythmias noted during transit. - emergent cath which showed 2 vessel CAD with diffuse 80% mid-LAD stenosis, 85% 1st Mrg stenosis, 40% mid-RCA stenosis, 60% RPDA 1 and 2 lesions, and 40% distal RCA stenosis with no acute lesions felt to be responsible for his acute presentation. - CT Head showed chronic changes including moderate to severe chronic small vessel ischemic disease with old gray nuclei lacunar infarcts with no acute intracranial processes.  - Lactic Acid elevated at 4.62. - felt  to secondary to new onset seizure and abnormal EKG findings.  Per Neuro - "with negative EEG, and improving exam neuro suspects that his findings are result of a prolonged postictal state. It is possible that he had a prolonged seizure."  Plan for MRI. Started on Keppra.     2. CAD - s/p multiple PCI to RCA, Cfx, and RI, with most recent DES to LAD in 12/2013. - cath today  showing 2 vessel CAD with diffuse 80% mid-LAD stenosis, 85% 1st Mrg stenosis, 40% mid-RCA stenosis, 60% RPDA 1 and 2 lesions, and 40% distal RCA stenosis. Multiple stents were noted to be patent including the proximal LAD, ramus intermediate, LCx, and proximal RCA.  - continue ASA, statin, Plavix and Zetia.  - hold coreg due to bradycardia. If resolved decrease coreg to 3.125mg  BID - 2D echo pending - EKG today show sinus brady with inferior infarct and septal infarct with anterolateral ST abnormality  3. HTN - creatinine improved.  Restart Lisinopril.  - hold coreg due to bradycardia  4. HLD - continue statin therapy, fenofibrate and Zetia.   5. Type 2 DM  - hold Metformin for 48 hours following catheterization.  6.  Acute diastolic CHF - cxray is normal but LVEDP markedly elevated at cath at 54mmHg.  Does not appear volume overloaded on exam.  Will add lasix po 20mg  daily and check BNP  Appears that CCM did consult and did not assume care as initially discussed.  Will ask them to assume care as problem felt not to be cardiac.   I have spent a total of 35 minutes with patient reviewing cath report , telemetry, EKGs, labs and examining patient as well as establishing an assessment and plan that was discussed with the patient.  > 50% of time was spent in direct patient care.    Signed, Fransico Him, MD  01/16/2016, 9:30 AM

## 2016-01-16 NOTE — Plan of Care (Signed)
Problem: Activity: Goal: Ability to return to baseline activity level will improve Outcome: Not Progressing Pt remains confused with AMS. Will attempt OOB in AM  Problem: Cardiovascular: Goal: Vascular access site(s) Level 0-1 will be maintained Outcome: Progressing TR band off without complications. Level 1  Problem: Education: Goal: Understanding of CV disease, CV risk reduction, and recovery process will improve Outcome: Not Progressing PT remains confused. Family educated  Problem: Health Behavior/Discharge Planning: Goal: Ability to safely manage health-related needs after discharge will improve Outcome: Not Progressing See above

## 2016-01-16 NOTE — Progress Notes (Signed)
PULMONARY / CRITICAL CARE MEDICINE   Name: Carl Stephenson MRN: [id number] DOB: 12/16/1945    ADMISSION DATE:  01/15/2016 CONSULTATION DATE:  01/15/16  REFERRING MD:  Dr. Martinique  CHIEF COMPLAINT:  Altered mental status   HISTORY OF PRESENT ILLNESS:   Carl Stephenson is a 71yo man with PMHx of CAD with prior MI and PCI, type 2 DM, prior CVA in 2015, and bladder cancer who presented to the ED on 01/15/16 after being found unresponsive at home. Per family he went to the bathroom tonight and when he got back he sat down and then suddenly moaned out and sprawled out his arms and legs stiffly with his eyes rolling to the back of his head. Family denies any whole body shaking. Family states he was unresponsive so EMS was called. He was brought by [facility] with AMS and EKG changes with probable STEMI. Patient not complaining of any chest pain or shortness of breath. Upon arrival to [facility], he was taken to the cardiac cath lab which showed 2 vessel obstructive CAD with diffuse mid-LAD disease and diffuse disease in the 1st OM, multiple stents patent, and a moderately elevated LVEDP. Family reports he has not been acting himself for the last few days. He has been "spacing out" and they will have to redirect him to regain his attention. Family also notes decreased PO intake lately. Wife reports he was supposed to see a Neurologist due to his eyes crossing and supposedly "fluid in the eyes."    SUBJECTIVE:  No issues overnight. More awake but still is slow to answer questions.  (-) subjective complaints.   VITAL SIGNS: BP (!) 149/66   Pulse (!) 49   Temp 98.3 F (36.8 C) (Oral)   Resp 17   Ht 5\' 6"  (1.676 m)   Wt 88 kg (194 lb 0.1 oz)   SpO2 95%   BMI 31.31 kg/m   HEMODYNAMICS:    VENTILATOR SETTINGS:    INTAKE / OUTPUT: I/O last 3 completed shifts: In: 837.5 [I.V.:837.5] Out: 1160 [Urine:1160]  PHYSICAL EXAMINATION: General: elderly man. Comfortably laying in bed. (-) subj complaints.   Neuro:  CN grossly intact. (-) lateralizing signs HEENT: EOMI, PERRL, sclera anicteric, mucus membranes dry Cardiovascular:  RRR, no m/g/r Lungs: CTA bilaterally, breaths non-labored Abdomen:  BS+, soft, non-tender Musculoskeletal: Moves all extremities   Skin:  No rashes, bruises, petechiae  LABS:  BMET  Recent Labs Lab 01/15/16 0420 01/16/16 0353  NA 137  138 138  K 4.9  4.8 3.6  CL 106  104 107  CO2 20* 24  BUN 22*  24* 21*  CREATININE 1.39*  1.50* 1.21  GLUCOSE 258*  255* 143*    Electrolytes  Recent Labs Lab 01/15/16 0420 01/15/16 1256 01/16/16 0353  CALCIUM 8.7*  --  8.2*  MG  --  2.1  --     CBC  Recent Labs Lab 01/15/16 0420 01/16/16 0353  WBC 8.6 8.7  HGB 13.2  12.9* 12.0*  HCT 39.8  38.0* 36.4*  PLT 172 160    Coag's  Recent Labs Lab 01/15/16 0420  APTT 31  INR 0.98    Sepsis Markers  Recent Labs Lab 01/15/16 0421 01/15/16 0826 01/15/16 1256  LATICACIDVEN 4.62* 1.2 1.1    ABG No results for input(s): PHART, PCO2ART, PO2ART in the last 168 hours.  Liver Enzymes  Recent Labs Lab 01/15/16 0420  AST 20  ALT 13*  ALKPHOS 61  BILITOT 0.5  ALBUMIN 3.3*  Cardiac Enzymes  Recent Labs Lab 01/15/16 0420  TROPONINI 0.08*    Glucose  Recent Labs Lab 01/15/16 0915 01/15/16 1210 01/15/16 1658 01/15/16 2141 01/16/16 0803  GLUCAP 245* 210* 155* 175* 148*    Imaging Dg Chest Port 1 View  Result Date: 01/16/2016 CLINICAL DATA:  Dyspnea, recent heart catheterization EXAM: PORTABLE CHEST 1 VIEW COMPARISON:  07/17/2015 FINDINGS: Cardiac shadow remains enlarged. The lungs are well aerated bilaterally. No significant congestive failure is noted. No acute bony abnormality is seen. IMPRESSION: No acute abnormality noted. Electronically Signed   By: Carl Stephenson M.D.   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 Multiple stents patent including the proximal LAD, ramus intermediate, LCx, and proximal RCA. Moderately elevated LVEDP  CULTURES: None  ANTIBIOTICS: None  SIGNIFICANT EVENTS: 1/3>> Found unresponsive at home. EKG changes with apparent STEMI and taken to cath lab  1/3>> Cath without significant changes   LINES/TUBES: None  DISCUSSION: [name] is a 67yo man with PMHx of CAD with prior MI and s/p PCI, prior stroke, and type 2 DM presenting with a few day hx of AMS acutely worsening over the last few hours. EMS EKG with apparent STEMI changes and patient went for cardiac cath which did not show a clear culprit to explain his symptoms. EEG is (-) for szes. Mental status slowly improving.   ASSESSMENT / PLAN:  NEUROLOGIC A:   Acute encephalopathy- likely presented post ictal.  Hx CVA/TIAs Hx Depression P:   Mental status slowly improving.  Neurology following. Appreciate recommendations.  Cont Keppra. Plan for MRI per Neuro >> not sure if we can avoid contrast as creatinine was recently elevated. Will ask neuro.  Continue home Paxil   PULMONARY A: No acute issues P:   None  CARDIOVASCULAR A:  CAD. H/O previous PCI LBBB.  HTN Hyperlipidemia P:  Cardiology following. Appreciate recommendations.  Continue home ASA, statin, Plavix, Zetia. Coreg on hold as HR in the 50s. ACE inhibitor started today.  Echocardiogram is pending.   RENAL A:   AKI- likely prerenal due to poor PO intake Lactic acidosis, resolved.  P:   Creatinine better. Observe.   GASTROINTESTINAL A:   Hx GERD P:   Continue home PPI  HEMATOLOGIC A:   Hx bladder cancer, stable No acute issues P:  None  INFECTIOUS A:   No evidence of infection P:   Monitor for fever and WBC trend given AMS  ENDOCRINE A:   Hx DM   P:   SSI Can add home Levemir when eating more consistently    FAMILY  - Updates: Wife, son, and daughter  updated at bedside on 1/3. No family around on 1/4.   - Inter-disciplinary family meet or Palliative Care meeting due by: 01/21/16  Plan to transfer to telemetry today.  TRH will be primary starting 1/5, PCCM will be off then.  Case discussed with Dr. Dia Crawford.   Carl Becton, MD 01/16/2016, 10:32 AM  Pulmonary and Critical Care Pager [phone] After 3 pm or if no answer, call [phone]

## 2016-01-17 ENCOUNTER — Encounter (HOSPITAL_COMMUNITY): Payer: Self-pay | Admitting: General Practice

## 2016-01-17 ENCOUNTER — Inpatient Hospital Stay (HOSPITAL_COMMUNITY): Payer: Medicare Other

## 2016-01-17 DIAGNOSIS — I6340 Cerebral infarction due to embolism of unspecified cerebral artery: Secondary | ICD-10-CM

## 2016-01-17 DIAGNOSIS — R569 Unspecified convulsions: Secondary | ICD-10-CM

## 2016-01-17 DIAGNOSIS — I639 Cerebral infarction, unspecified: Secondary | ICD-10-CM

## 2016-01-17 LAB — GLUCOSE, CAPILLARY
GLUCOSE-CAPILLARY: 195 mg/dL — AB (ref 65–99)
GLUCOSE-CAPILLARY: 180 mg/dL — AB (ref 65–99)
GLUCOSE-CAPILLARY: 188 mg/dL — AB (ref 65–99)
Glucose-Capillary: 139 mg/dL — ABNORMAL HIGH (ref 65–99)
Glucose-Capillary: 187 mg/dL — ABNORMAL HIGH (ref 65–99)

## 2016-01-17 LAB — LIPID PANEL
CHOL/HDL RATIO: 8 ratio
CHOLESTEROL: 215 mg/dL — AB (ref 0–200)
HDL: 27 mg/dL — ABNORMAL LOW (ref >40)
LDL Cholesterol: 140 mg/dL — ABNORMAL HIGH (ref 0–99)
TRIGLYCERIDES: 238 mg/dL — AB (ref <150)
VLDL: 48 mg/dL — ABNORMAL HIGH (ref 0–40)

## 2016-01-17 LAB — HEMOGLOBIN A1C
Hgb A1c MFr Bld: 7.7 % — ABNORMAL HIGH (ref 4.8–5.6)
MEAN PLASMA GLUCOSE: 174 mg/dL

## 2016-01-17 MED ORDER — ASPIRIN EC 81 MG PO TBEC
81.0000 mg | DELAYED_RELEASE_TABLET | Freq: Every day | ORAL | Status: DC
  Administered 2016-01-17 – 2016-01-21 (×5): 81 mg via ORAL
  Filled 2016-01-17 (×5): qty 1

## 2016-01-17 MED ORDER — ATORVASTATIN CALCIUM 80 MG PO TABS
80.0000 mg | ORAL_TABLET | Freq: Every day | ORAL | Status: DC
  Administered 2016-01-17 – 2016-01-21 (×5): 80 mg via ORAL
  Filled 2016-01-17 (×5): qty 1

## 2016-01-17 MED ORDER — ROSUVASTATIN CALCIUM 10 MG PO TABS: 40.0000 mg | ORAL_TABLET | Freq: Every day | ORAL | Status: DC

## 2016-01-17 MED ORDER — ASPIRIN EC 325 MG PO TBEC: 325.0000 mg | DELAYED_RELEASE_TABLET | Freq: Every day | ORAL | Status: DC

## 2016-01-17 MED ORDER — FUROSEMIDE 40 MG PO TABS
40.0000 mg | ORAL_TABLET | Freq: Every day | ORAL | Status: DC
Start: 2016-01-17 — End: 2016-01-21
  Administered 2016-01-17 – 2016-01-21 (×5): 40 mg via ORAL
  Filled 2016-01-17 (×5): qty 1

## 2016-01-17 MED ORDER — FENOFIBRATE 160 MG PO TABS
160.0000 mg | ORAL_TABLET | Freq: Every day | ORAL | Status: DC
  Administered 2016-01-17 – 2016-01-21 (×5): 160 mg via ORAL
  Filled 2016-01-17 (×5): qty 1

## 2016-01-17 MED ORDER — SERTRALINE HCL 100 MG PO TABS
100.0000 mg | ORAL_TABLET | Freq: Every day | ORAL | Status: DC
  Administered 2016-01-17 – 2016-01-21 (×5): 100 mg via ORAL
  Filled 2016-01-17 (×5): qty 1

## 2016-01-17 NOTE — Progress Notes (Signed)
Patient awake alert and orin. Had 12 bts. WCT . Patient  also cont. To have freq. PVC's . Patient has a HX. of WCT. Patient is in S.B. R.N. Aware cont. To monitor patient and rhythm.

## 2016-01-17 NOTE — Progress Notes (Signed)
    [facility] has been requested to perform a transesophageal echocardiogram on 01/19/16 for CVA.  After careful review of history and examination, the risks and benefits of transesophageal echocardiogram have been explained including risks of esophageal damage, perforation (1:10,000 risk), bleeding, pharyngeal hematoma as well as other potential complications associated with conscious sedation including aspiration, arrhythmia, respiratory failure and death. Alternatives to treatment were discussed, questions were answered. Patient is willing to proceed.   Labs area stable, along with vital signs. Pt is not on pressers.   Reino Bellis, NP-C 01/17/2016 2:04 PM

## 2016-01-17 NOTE — Progress Notes (Addendum)
*  PRELIMINARY RESULTS* Vascular Ultrasound Carotid Duplex (Doppler) has been completed.   Findings suggest 1-39% internal carotid artery stenosis bilaterally. Vertebral arteries are patent with antegrade flow.  Bilateral lower extremity venous duplex completed. Bilateral lower extremities are negative for deep vein thrombosis. There is no evidence of Baker's cyst bilaterally.   01/17/2016 5:11 PM Maudry Mayhew, BS, RVT, RDCS, RDMS

## 2016-01-17 NOTE — Progress Notes (Addendum)
Patient ID: Carl Stephenson, male   DOB: [birth year], 71 y.o.   MRN: [id number]  PROGRESS NOTE    Carl Stephenson  [id number] DOB: [birth year] DOA: 01/15/2016  PCP: [name], MD   Brief Narrative:  71yo man with PMHx of CAD with prior MI and PCI, type 2 DM, prior CVA in 2015, and bladder cancer who presented to the ED on 01/15/16 after being found unresponsive at home. Per family he went to the bathroom tonight and when he got back he sat down and then suddenly moaned out and sprawled out his arms and legs stiffly with his eyes rolling to the back of his head. Family denies any whole body shaking. Family states he was unresponsive so EMS was called. He was brought by [facility] with AMS and EKG changes with probable STEMI. Patient not complaining of any chest pain or shortness of breath. Upon arrival to [facility], he was taken to the cardiac cath lab which showed 2 vessel obstructive CAD with diffuse mid-LAD disease and diffuse disease in the 1st OM, multiple stents patent, and a moderately elevated LVEDP. Family reports he has not been acting himself for the last few days. He has been "spacing out" and they will have to redirect him to regain his attention. Family also notes decreased PO intake lately. Wife reports he was supposed to see a Neurologist due to his eyes crossing and supposedly "fluid in the eyes."   SIGNIFICANT EVENTS: 1/3>> Found unresponsive at home. EKG changes with apparent STEMI and taken to cath lab  1/3>> Cath without significant changes   Assessment & Plan:   Acute encephalopathy / Acute CVA R brain, anterior and posterior circulation infarcts, felt to be cardioembolic with recent cardiac event - MRI  R MCA, R MCA/ACA, R PCA and R cerebellar punctate/patchy infarcts - MRA  Moderate to severe stenosis R VA and B P2 - Carotid Doppler pending - 2D Echo  EF 45-50%. No source of embolus - Needs TEE and loop to rule out cardiac thrombus and afib - LDL 140 - HgbA1c 7.7 -  Heparin subQ for VTE prophylaxis  - Continue aspirin 81 mg daily and clopidogrel 75 mg daily. - Appreciate PT evaluation   Seizure - Suspected prolonged postictal state - Continue Keppra - EEG no seizure but right focal slowing - Appreciate neuro recommendations   Hypertension, essential  - Cardiology restarted lisinopril after creatinine improved - Monitor renal function   CAD s/p stent - Extensive hx of CAD  - EF 45-50% - Cardiac cath showed multi vessel CAD but no acute intervention - mult PCI to RCA, CFx and RI w/ DES to LAD 2015; cath this adm w/ this adm w/ mult stents patent to LAD, ramus intermediate, LCx, prox RCA - On aspirin and plavix  History of stroke - 2015 MRI left frontal and right MCA/PCA punctate infarcts, old pontine, left thalamic and right CR lacunar infarcts - 2 days post cardiac cath and stent placement. TEE and loop not completed.  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 Multiple stents patent including the proximal LAD, ramus intermediate, LCx, and proximal RCA. Moderately elevated LVEDP  EEG  01/15/2016 - diffuse cerebral dysfunction that is non-specific in etiology and can be seen with hypoxic/ischemic injury, toxic/metabolic encephalopathies, neurodegenerative disorders, or medication effect. Additional focal slowing over the right hemisphere indicates focal cerebral dysfunction in this region suggestive of underlying  structural or physiologic abnormality.The absence of epileptiform discharges does not rule out a clinical diagnosis of epilepsy. Clinical correlation is advised.  2 D ECHO 01/16/2016 - EF 45-50%, diffuse hypokinesis, moderate LVH  LE doppler - pending   Antimicrobials:   None    Subjective: No overnight events.  Objective: Vitals:   01/16/16 1606 01/16/16 2103 01/17/16 0450 01/17/16 0742  BP: (!) 162/66 (!) 154/79 (!) 155/77 (!) 155/60  Pulse: (!) 47 (!) 55 (!) 59 61  Resp: 19 20 18    Temp: 97.9 F (36.6 C) 97.6 F (36.4 C) 98.6 F (37 C)   TempSrc: Oral Oral Oral   SpO2: 98% 98% 98%   Weight:   89.3 kg (196 lb 13.9 oz)   Height:        Intake/Output Summary (Last 24 hours) at 01/17/16 1212 Last data filed at 01/17/16 0800  Gross per 24 hour  Intake              605 ml  Output              625 ml  Net              -20 ml   Filed Weights   01/15/16 0600 01/16/16 0500 01/17/16 0450  Weight: 88.2 kg (194 lb 7.1 oz) 88 kg (194 lb 0.1 oz) 89.3 kg (196 lb 13.9 oz)    Examination:  General exam: Appears calm and comfortable  Respiratory system: Clear to auscultation. Respiratory effort normal. Cardiovascular system: S1 & S2 heard, RRR. No JVD, murmurs, rubs, gallops or clicks. No pedal edema. Gastrointestinal system: Abdomen is nondistended, soft and nontender. No organomegaly or masses felt. Normal bowel sounds heard. Central nervous system: Alert and oriented. No focal neurological deficits. Extremities: Symmetric 5 x 5 power. Skin: No rashes, lesions or ulcers Psychiatry: Judgement and insight appear normal. Mood & affect appropriate.   Data Reviewed: I have personally reviewed following labs and imaging studies  CBC:  Recent Labs Lab 01/15/16 0420 01/16/16 0353  WBC 8.6 8.7  NEUTROABS 6.8  --   HGB 13.2  12.9* 12.0*  HCT 39.8  38.0* 36.4*  MCV 77.7* 77.6*  PLT 172 0000000   Basic Metabolic Panel:  Recent Labs Lab 01/15/16 0420 01/15/16 1256  01/16/16 0353  NA 137  138  --  138  K 4.9  4.8  --  3.6  CL 106  104  --  107  CO2 20*  --  24  GLUCOSE 258*  255*  --  143*  BUN 22*  24*  --  21*  CREATININE 1.39*  1.50*  --  1.21  CALCIUM 8.7*  --  8.2*  MG  --  2.1  --    GFR: Estimated Creatinine Clearance: 58.6 mL/min (by C-G formula based on SCr of 1.21 mg/dL). Liver Function Tests:  Recent Labs Lab 01/15/16 0420  AST 20  ALT 13*  ALKPHOS 61  BILITOT 0.5  PROT 6.1*  ALBUMIN 3.3*   No results for input(s): LIPASE, AMYLASE in the last 168 hours.  Recent Labs Lab 01/15/16 1256  AMMONIA 38*   Coagulation Profile:  Recent Labs Lab 01/15/16 0420  INR 0.98   Cardiac Enzymes:  Recent Labs Lab 01/15/16 0420  TROPONINI 0.08*   BNP (last 3 results) No results for input(s): PROBNP in the last 8760 hours. HbA1C:  Recent Labs  01/16/16 1637  HGBA1C 7.7*   CBG:  Recent Labs Lab 01/16/16 1200 01/16/16 1640 01/16/16 2043 01/17/16 0620 01/17/16 1118  GLUCAP 149* 159* 195* 139* 187*   Lipid Profile:  Recent Labs  01/15/16 0418 01/17/16 0416  CHOL 214* 215*  HDL 30* 27*  LDLCALC 127* 140*  TRIG 283* 238*  CHOLHDL 7.1 8.0   Thyroid Function Tests: No results for input(s): TSH, T4TOTAL, FREET4, T3FREE, THYROIDAB in the last 72 hours. Anemia Panel: No results for input(s): VITAMINB12, FOLATE, FERRITIN, TIBC, IRON, RETICCTPCT in the last 72 hours. Urine analysis:    Component Value Date/Time   COLORURINE YELLOW 01/15/2016 1021   APPEARANCEUR CLEAR 01/15/2016 1021   LABSPEC 1.045 (H) 01/15/2016 1021   PHURINE 6.0 01/15/2016 1021   GLUCOSEU >=500 (A) 01/15/2016 1021   HGBUR SMALL (A) 01/15/2016 1021   BILIRUBINUR NEGATIVE 01/15/2016 1021   KETONESUR NEGATIVE 01/15/2016 1021   PROTEINUR 100 (A) 01/15/2016 1021   UROBILINOGEN 0.2 12/29/2013 0040   NITRITE NEGATIVE 01/15/2016 1021   LEUKOCYTESUR NEGATIVE 01/15/2016 1021   Sepsis  Labs: @LABRCNTIP (procalcitonin:4,lacticidven:4)   MRSA PCR Screening     Status: None   Collection Time: 01/15/16  5:45 AM  Result Value Ref Range Status   MRSA by PCR NEGATIVE NEGATIVE Final  Culture, blood (routine x 2)     Status: None (Preliminary result)   Collection Time: 01/15/16 12:56 PM  Result Value Ref Range Status   Specimen Description BLOOD RIGHT HAND  Final   Special Requests IN PEDIATRIC BOTTLE  10CC  Final   Culture NO GROWTH 1 DAY  Final   Report Status PENDING  Incomplete  Culture, blood (routine x 2)     Status: None (Preliminary result)   Collection Time: 01/15/16  1:00 PM  Result Value Ref Range Status   Specimen Description BLOOD RIGHT HAND  Final   Special Requests IN PEDIATRIC BOTTLE  3CC  Final   Culture NO GROWTH 1 DAY  Final   Report Status PENDING  Incomplete      Radiology Studies: Ct Head Wo Contrast Result Date: 01/15/2016 No acute intracranial process. Chronic changes including moderate to severe chronic small vessel ischemic disease and old deep gray nuclei lacunar infarcts. Electronically Signed   By: Elon Alas M.D.   On: 01/15/2016 04:57   Mr Jodene Nam Head Wo Contrast Result Date: 01/16/2016 1. Posterior circulation atherosclerosis with moderate to severe stenosis of the distal right vertebral artery just proximal to the vertebrobasilar junction. There is mild bilateral PCA P2 segment stenosis. 2. Anterior circulation without significant stenosis. Electronically Signed   By: Genevie Ann M.D.   On: 01/16/2016 20:54   Mr Brain Wo Contrast Result Date: 01/16/2016 1. For small nodular foci of restricted diffusion in the right brain which most resemble small acute infarcts. These are present in the right MCA, right PCA, and right cerebellar artery territories which could indicate sequelae of embolic event, although sparing of the left brain is noted. 2. No associated acute hemorrhage or mass effect. Underlying advanced chronic small vessel disease otherwise  appears  stable since 2015. Electronically Signed   By: Genevie Ann M.D.   On: 01/16/2016 13:38   Dg Chest Port 1 View Result Date: 01/16/2016 No acute abnormality noted. Electronically Signed   By: Inez Catalina M.D.   On: 01/16/2016 07:21      Scheduled Meds: . aspirin EC  81 mg Oral Daily  . atorvastatin  80 mg Oral q1800  . carvedilol  3.125 mg Oral BID WC  . clopidogrel  75 mg Oral Q breakfast  . fenofibrate  160 mg Oral Daily  . furosemide  40 mg Oral Daily  . heparin  5,000 Units Subcutaneous Q8H  . insulin aspart  0-15 Units Subcutaneous TID WC  . insulin aspart  0-5 Units Subcutaneous QHS  . levETIRAcetam  500 mg Oral BID  . lisinopril  10 mg Oral Daily  . pantoprazole  40 mg Oral Daily  . potassium chloride  20 mEq Oral Daily  . sertraline  100 mg Oral Daily   Continuous Infusions:   LOS: 2 days    Time spent: 25 minutes  Greater than 50% of the time spent on counseling and coordinating the care.   Leisa Lenz, MD [facility] Pager [phone]  If 7PM-7AM, please contact night-coverage [email or web address] Password [email or web address] 01/17/2016, 12:12 PM

## 2016-01-17 NOTE — Progress Notes (Addendum)
Patient Name: Carl Stephenson Date of Encounter: 01/17/2016  Primary Cardiologist: Dr. Tyrell Antonio Problem List     Active Problems:   Hyperlipidemia   HYPERTENSION, BENIGN   CAD, NATIVE VESSEL   Acute encephalopathy   LBBB (left bundle branch block)   Coronary artery disease involving coronary bypass graft of native heart with angina pectoris (HCC)   ECG abnormality   History of CVA (cerebrovascular accident)   Lactic acidosis   Convulsions (HCC)   Bradycardia   Abnormal EKG     Subjective   Denies any chest pain or SOB  Inpatient Medications    Scheduled Meds: . aspirin EC  325 mg Oral Daily  . carvedilol  3.125 mg Oral BID WC  . clopidogrel  75 mg Oral Q breakfast  . fenofibrate  160 mg Oral Daily  . furosemide  20 mg Oral Daily  . heparin  5,000 Units Subcutaneous Q8H  . insulin aspart  0-15 Units Subcutaneous TID WC  . insulin aspart  0-5 Units Subcutaneous QHS  . levETIRAcetam  500 mg Oral BID  . lisinopril  10 mg Oral Daily  . pantoprazole  40 mg Oral Daily  . potassium chloride  20 mEq Oral Daily  . rosuvastatin  40 mg Oral q1800  . sertraline  100 mg Oral Daily   Continuous Infusions:  PRN Meds: acetaminophen, hydrALAZINE, labetalol, ondansetron (ZOFRAN) IV, sodium chloride flush   Vital Signs    Vitals:   01/16/16 1606 01/16/16 2103 01/17/16 0450 01/17/16 0742  BP: (!) 162/66 (!) 154/79 (!) 155/77 (!) 155/60  Pulse: (!) 47 (!) 55 (!) 59 61  Resp: 19 20 18    Temp: 97.9 F (36.6 C) 97.6 F (36.4 C) 98.6 F (37 C)   TempSrc: Oral Oral Oral   SpO2: 98% 98% 98%   Weight:   196 lb 13.9 oz (89.3 kg)   Height:        Intake/Output Summary (Last 24 hours) at 01/17/16 0801 Last data filed at 01/16/16 2200  Gross per 24 hour  Intake              405 ml  Output              625 ml  Net             -220 ml   Filed Weights   01/15/16 0600 01/16/16 0500 01/17/16 0450  Weight: 194 lb 7.1 oz (88.2 kg) 194 lb 0.1 oz (88 kg) 196 lb 13.9 oz  (89.3 kg)    Physical Exam    GEN: Well nourished, well developed, in no acute distress.  HEENT: Grossly normal.  Neck: Supple, no JVD, carotid bruits, or masses. Cardiac: RRR, no murmurs, rubs, or gallops. No clubbing, cyanosis, edema.  Radials/DP/PT 2+ and equal bilaterally.  Respiratory:  Respirations regular and unlabored, clear to auscultation bilaterally. GI: Soft, nontender, nondistended, BS + x 4. MS: no deformity or atrophy. Skin: warm and dry, no rash. Neuro:  Strength and sensation are intact. Psych: AAOx3.  Normal affect.  Labs    CBC  Recent Labs  01/15/16 0420 01/16/16 0353  WBC 8.6 8.7  NEUTROABS 6.8  --   HGB 13.2  12.9* 12.0*  HCT 39.8  38.0* 36.4*  MCV 77.7* 77.6*  PLT 172 0000000   Basic Metabolic Panel  Recent Labs  01/15/16 0420 01/15/16 1256 01/16/16 0353  NA 137  138  --  138  K 4.9  4.8  --  3.6  CL 106  104  --  107  CO2 20*  --  24  GLUCOSE 258*  255*  --  143*  BUN 22*  24*  --  21*  CREATININE 1.39*  1.50*  --  1.21  CALCIUM 8.7*  --  8.2*  MG  --  2.1  --    Liver Function Tests  Recent Labs  01/15/16 0420  AST 20  ALT 13*  ALKPHOS 61  BILITOT 0.5  PROT 6.1*  ALBUMIN 3.3*   No results for input(s): LIPASE, AMYLASE in the last 72 hours. Cardiac Enzymes  Recent Labs  01/15/16 0420  TROPONINI 0.08*   BNP Invalid input(s): POCBNP D-Dimer No results for input(s): DDIMER in the last 72 hours. Hemoglobin A1C  Recent Labs  01/16/16 1637  HGBA1C 7.7*   Fasting Lipid Panel  Recent Labs  01/17/16 0416  CHOL 215*  HDL 27*  LDLCALC 140*  TRIG 238*  CHOLHDL 8.0   Thyroid Function Tests No results for input(s): TSH, T4TOTAL, T3FREE, THYROIDAB in the last 72 hours.  Invalid input(s): FREET3  Telemetry    Sinus bradycardia - Personally Reviewed  ECG    Sinus bradycardia - Personally Reviewed  Radiology    Mr Jodene Nam Head Wo Contrast  Result Date: 01/16/2016 CLINICAL DATA:  71 y/o male with  encephalopathy. Four small foci of restricted diffusion in the right brain on brain MRI earlier today. Initial encounter. EXAM: MRA HEAD WITHOUT CONTRAST TECHNIQUE: Angiographic images of the Circle of Willis were obtained using MRA technique without intravenous contrast. COMPARISON:  Brain MRI 1241 hours today. FINDINGS: Antegrade flow in the posterior circulation. The distal left vertebral artery is dominant. There is moderate to severe stenosis of the distal right vertebral artery just proximal to the vertebrobasilar junction. The more proximal right vertebral appears normal. Both PICA origins appear normal. Patent basilar artery without irregularity or stenosis. Shared origins of the bilateral PCAs and ICAs appear within normal limits. Posterior communicating arteries are diminutive or absent. There is mild irregularity and stenosis in the bilateral P2 PCA segments (series 305, image 2 on the right and image 17 on the left). There is preserved bilateral distal PCA flow. Antegrade flow in both ICA siphons. Tortuous distal cervical ICA on the left. Siphon irregularity in keeping with atherosclerosis but no hemodynamically significant stenosis identified. Ophthalmic artery origins are normal. Carotid termini, MCA and ACA origins are normal. Anterior communicating artery is diminutive or absent. Visualized ACA branches are within normal limits. Mild irregularity of the right MCA M1 segment without significant stenosis. The left M1 segment is normal aside from tortuosity. The right MCA bifurcation is normal. 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 No MCA branch stenosis identified. IMPRESSION: 1. Posterior circulation atherosclerosis with moderate to severe stenosis of the distal right vertebral artery just proximal to the vertebrobasilar junction. There is mild  bilateral PCA P2 segment stenosis. 2. Anterior circulation without significant stenosis. Electronically Signed   By: Genevie Ann M.D.   On: 01/16/2016 20:54   Mr Brain Wo Contrast  Result Date: 01/16/2016 CLINICAL DATA:  71 year old male with encephalopathy status post code STEMI. Possible prolonged seizure and extended postictal state. Initial encounter. EXAM: MRI HEAD WITHOUT CONTRAST TECHNIQUE: Multiplanar, multiecho pulse sequences of the brain and surrounding structures were obtained without intravenous contrast. COMPARISON:  Head CT  01/15/2016 and earlier.  Brain MRI 12/29/2013. FINDINGS: Brain: Extensive chronic small vessel disease in the pons and bilateral deep gray matter nuclei. Chronic Patchy and confluent bilateral cerebral white matter T2 and FLAIR hyperintensity also likely chronic small vessel ischemia related. There are 4 small foci of restricted diffusion identified. The largest is in the posterior right cerebellum measuring about 10 mm. Three subcentimeter areas then are identified in the right cerebral hemisphere, but to or in the posterior right MCA territory while one is in the dorsal right thalamus (right PCA territory). No left hemisphere restricted diffusion. Mild associated T2 and FLAIR hyperintensity. No acute hemorrhage or mass effect. No diffusion abnormality in the mesial temporal lobes. The hippocampal formations appear grossly stable and symmetric. No midline shift, mass effect, evidence of mass lesion, ventriculomegaly, extra-axial collection or acute intracranial hemorrhage. Cervicomedullary junction and pituitary are within normal limits. Possible mild chronic blood products in the left ventral pons. Vascular: Major intracranial vascular flow voids are stable since 2015 and appear normal aside from mild generalized intracranial artery dolichoectasia. Skull and upper cervical spine: Visible cervical spine degraded by motion, grossly stable. Visualized bone marrow signal is within  normal limits. Sinuses/Orbits: Interval postoperative changes to the left globe, otherwise negative orbits soft tissues. Visualized paranasal sinuses and mastoids are stable and well pneumatized. Other: Visible internal auditory structures appear normal. Negative scalp soft tissues. IMPRESSION: 1. For small nodular foci of restricted diffusion in the right brain which most resemble small acute infarcts. These are present in the right MCA, right PCA, and right cerebellar artery territories which could indicate sequelae of embolic event, although sparing of the left brain is noted. 2. No associated acute hemorrhage or mass effect. Underlying advanced chronic small vessel disease otherwise appears stable since 2015. Electronically Signed   By: Genevie Ann M.D.   On: 01/16/2016 13:38   Dg Chest Port 1 View  Result Date: 01/16/2016 CLINICAL DATA:  Dyspnea, recent heart catheterization EXAM: PORTABLE CHEST 1 VIEW COMPARISON:  07/17/2015 FINDINGS: Cardiac shadow remains enlarged. The lungs are well aerated bilaterally. No significant congestive failure is noted. No acute bony abnormality is seen. IMPRESSION: No acute abnormality noted. Electronically Signed   By: Inez Catalina M.D.   On: 01/16/2016 07:21    Cardiac Studies   Cath 01/15/2016 Conclusion     LV end diastolic pressure is moderately elevated.  Proximal LAD lesion, 0 %stenosed at prior stent.  Mid LAD to Dist LAD lesion, 80 %stenosed.  Ramus lesion, 0 %stenosed at prior stent  Ost RCA to Mid RCA lesion, 20 %stenosed at prior stent.  Mid RCA lesion, 40 %stenosed.  RPDA-2 lesion, 60 %stenosed.  RPDA-1 lesion, 60 %stenosed.  Dist RCA lesion, 40 %stenosed.  1st Mrg lesion, 85 %stenosed.  Ost Cx to Mid Cx lesion, 10 %stenosed at prior stent.   1. 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 2 vessel obstructive CAD with diffuse mid LAD disease and diffuse disease in the first OM from the anomalous LCx 3. Multiple stents patent  including the proximal LAD, ramus intermediate, LCx, and proximal RCA 4. Moderately elevated LVEDP  Plan: patient has no clear culprit for his acute event. The LAD was diffusely diseased on prior study in 2015. The OM is worse than prior but still has TIMI 3 flow and is a small branch. The etiology of his acute unresponsiveness and encephalopathy remain unclear.      Patient [place]  is a 71 y.o. male with past medical history of CAD (s/p multiple PCI to RCA, Cfx, and RI, with most recent DES to LAD in 12/2013), CVA (2015), HTN, and HLD who presented to [facility] ED on 01/15/2016 for evaluation of unresponsiveness.    Assessment & Plan    1. Unresponsiveness/ Abnormal EKG - became acutely unresponsive on morning of admit when his eyes rolled back and extremities were stiff. EMS was called and EKG showed acute abnormalities, therefore CODE STEMI was activated. No pathologic arrhythmias noted during transit. - emergent cath which showed 2 vessel CAD with diffuse 80% mid-LAD stenosis, 85% 1st Mrg stenosis, 40% mid-RCA stenosis, 60% RPDA 1 and 2 lesions, and 40% distal RCA stenosis with no acute lesions felt to be responsible for his acute presentation. - CT Head showed chronic changes including moderate to severe chronic small vessel ischemic disease with old gray nuclei lacunar infarcts with no acute intracranial processes.  - MRI of head showed 4 new small acute infarcts likely embolic - he is on for TEE on Monday - felt  to secondary to new onset seizure and abnormal EKG findings.  Per Neuro - "with negative EEG, and improving exam neuro suspects that his findings are result of a prolonged postictal state. It is possible that he had a prolonged seizure."  Started on Keppra.  MRI showed posterior circulation atherosclerosis with moderate to severe stenosis of the distal right vertebral artery just proximal to the vertebrobasilar junction. There is mild bilateral PCA P2  segment stenosis.  MRI also showed 4 new acute infarcts likely cardioembolic. Await further input from Neuro.  He has been bradycardic so another diagnosis on the differential could be a bradyarrhythmia leading to cerebral hypoperfusion in the setting of significant vertebral stenosis.  He also had a LBBB when admitted so arrhythmias are of concern as LBBB has resolved. Of note he was note bradycardic on arrival in ER.[place] set up outpt event monitor to assess further.    2. CAD - s/p multiple PCI to RCA, Cfx, and RI, with most recent DES to LAD in 12/2013. - cath today showing 2 vessel CAD with diffuse 80% mid-LAD stenosis, 85% 1st Mrg stenosis, 40% mid-RCA stenosis, 60% RPDA 1 and 2 lesions, and 40% distal RCA stenosis. Multiple stents were noted to be patent including the proximal LAD, ramus intermediate, LCx, and proximal RCA.  - continue ASA, statin, Plavix and Zetia.  - decrease ASA to 81mg  daily - Currently on coreg to 3.125mg  BID with HR in low 60's. - 2D echo showed mild LV dysfunction with EF 45-50% with moderate LVH and mild LAE.  The EF is similar to EF in 2015. - EKG today show sinus brady with inferior infarct and anterio infarct with anterolateral ST abnormality - Reviewed films with Dr. Burt Knack and the LAD is diffusely disease and medical management recommended.    3. HTN - creatinine improved.  Restarted Lisinopril.  - Follow creatinine after restarting ACE I - continue low dose BB as HR tolerates - BP remains elevated after starting ACE I - would like input from Neuro on BP parameters given his significant vertebral artery stenosis  4. HLD - continue statin therapy, fenofibrate and Zetia.  - LDL 140 so ? If patient has been taking statin and zetia.   - will change crestor to Lipitor 80mg  daily and repeat FLP in 6 weeks  5. Type 2 DM  - hold Metformin for 48 hours following catheterization.  6.  Acute  diastolic CHF - cxray is normal but LVEDP markedly elevated at cath at  31mmHg.  Does not appear volume overloaded on exam.  BNP mildly elevated at 446.  Continue lasix 40mg  daily .  Follow daily BMET.  No new recs.  On for TEE on Monday to determined source of embolism.  Will follow at a distance over the weekend.  Signed, Fransico Him, MD  01/17/2016, 8:01 AM

## 2016-01-17 NOTE — Progress Notes (Signed)
STROKE TEAM PROGRESS NOTE   HISTORY OF PRESENT ILLNESS (per record) Carl Stephenson is an 71 y.o. male with no past medical history of seizure, head trauma, or febrile seizure. 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 Family and wife, patient had gotten up to go to the bathroom when suddenly he was noticed to fall down with both arms extended and legs extended, eyes rolling back, foaming at the mouth and bleeding at the mouth. Per wife this lasted for approximately 15 minutes and then patient was postictal and confused afterwards. EMS was called and patient was brought to the ED. On arrival to [facility] was taken to the cardiac cath lab which showed two-vessel obstructive CAD with diffuse mild LAD disease and diffuse disease in the first OM, multiple stents patent, and moderately elevated LVEDP.  In discussing with family patient does not smoke, patient does not drink, patient did not drink on New Year's Eve, patient does not do any illicit drugs. Patient does have issues with fluid behind the eyes and now having some disconjugate gaze twitches was to see a neurologist in the future for an MRI. As stated patient has never had a seizure in the past. Currently patient is alert, he is able to come ease at the hospital but does not have any recollection of what happened. He is having some difficulty following commands secondary to postictal state. EEG was obtained which did not show any overt seizure activity however formal reading is pending.  In the ED patient has not received any benzodiazepines from looking throughout the note and he has not received any antiepileptic medication. Patient has only had 1 seizure at this time.  Patient was not considered for IV t-PA secondary to non-stroke  presentation.    SUBJECTIVE (INTERVAL HISTORY) Pt daughter and wife are at bedside. They recounted HPI with me. Pt had GTC for 2 min followed by postictal state. MRI showed embolic stroke pattern. Etiology not clear at this time. Also discussed with pt for seizure precautions and no driving for 6 months.    OBJECTIVE Temp:  [97.6 F (36.4 C)-98.6 F (37 C)] 98.6 F (37 C) (01/05 0450) Pulse Rate:  [46-61] 61 (01/05 0742) Cardiac Rhythm: Normal sinus rhythm;Bundle branch block (01/05 0802) Resp:  [15-20] 18 (01/05 0450) BP: (137-162)/(60-79) 155/60 (01/05 0742) SpO2:  [95 %-100 %] 98 % (01/05 0450) Weight:  [89.3 kg (196 lb 13.9 oz)] 89.3 kg (196 lb 13.9 oz) (01/05 0450)  CBC:  Recent Labs Lab 01/15/16 0420 01/16/16 0353  WBC 8.6 8.7  NEUTROABS 6.8  --   HGB 13.2  12.9* 12.0*  HCT 39.8  38.0* 36.4*  MCV 77.7* 77.6*  PLT 172 0000000    Basic Metabolic Panel:  Recent Labs Lab 01/15/16 0420 01/15/16 1256 01/16/16 0353  NA 137  138  --  138  K 4.9  4.8  --  3.6  CL 106  104  --  107  CO2 20*  --  24  GLUCOSE 258*  255*  --  143*  BUN 22*  24*  --  21*  CREATININE 1.39*  1.50*  --  1.21  CALCIUM 8.7*  --  8.2*  MG  --  2.1  --     Lipid  Panel:    Component Value Date/Time   CHOL 215 (H) 01/17/2016 0416   TRIG 238 (H) 01/17/2016 0416   HDL 27 (L) 01/17/2016 0416   CHOLHDL 8.0 01/17/2016 0416   VLDL 48 (H) 01/17/2016 0416   LDLCALC 140 (H) 01/17/2016 0416   HgbA1c:  Lab Results  Component Value Date   HGBA1C 7.7 (H) 01/16/2016   Urine Drug Screen:    Component Value Date/Time   LABOPIA NONE DETECTED 01/15/2016 1021   COCAINSCRNUR NONE DETECTED 01/15/2016 1021   LABBENZ NONE DETECTED 01/15/2016 1021   AMPHETMU NONE DETECTED 01/15/2016 1021   THCU NONE DETECTED 01/15/2016 1021   LABBARB NONE DETECTED 01/15/2016 1021      IMAGING I have personally reviewed the radiological images below and agree with the radiology interpretations.  Mr Brain Wo  Contrast 01/16/2016 1. For small nodular foci of restricted diffusion in the right brain which most resemble small acute infarcts. These are present in the right MCA, right PCA, and right cerebellar artery territories which could indicate sequelae of embolic event, although sparing of the left brain is noted. 2. No associated acute hemorrhage or mass effect. Underlying advanced chronic small vessel disease otherwise appears stable since 2015.   Mr Jodene Nam Head Wo Contrast 01/16/2016 1. Posterior circulation atherosclerosis with moderate to severe stenosis of the distal right vertebral artery just proximal to the vertebrobasilar junction. There is mild bilateral PCA P2 segment stenosis. 2. Anterior circulation without significant stenosis.   Dg Chest Port 1 View 01/16/2016 No acute abnormality noted.   2D Echocardiogram  - Procedure narrative: Transthoracic echocardiography. Technically difficult study with reduced echo windows. Definity contrast was administered. - Left ventricle: The cavity size was normal. Wall thickness was increased in a pattern of moderate LVH. Systolic function was mildly reduced. The estimated ejection fraction was in the range of 45% to 50%. Diffuse hypokinesis. The study is not technically sufficient to allow evaluation of LV diastolic function. - Aortic valve: Poorly visualized. - Left atrium: The atrium was mildly dilated. - Right atrium: The atrium was at the upper limits of normal in size. - Inferior vena cava: The vessel was normal in size. The respirophasic diameter changes were in the normal range (>= 50%), consistent with normal central venous pressure. Impressions:   Technically difficult study. LVEF appears similar to prior study in 2015. Difficult to assess wall motion due to frequent PVC&'s.  EEG  This awake and drowsy EEG is abnormal due to the presence of: 1. Moderate diffuse slowing of the waking background 2. Additional focal slowing over the right  hemisphere Clinical Correlation of the above findings indicates diffuse cerebral dysfunction that is non-specific in etiology and can be seen with hypoxic/ischemic injury, toxic/metabolic encephalopathies, neurodegenerative disorders, or medication effect. Additional focal slowing over the right hemisphere indicates focal cerebral dysfunction in this region suggestive of underlying structural or physiologic abnormality. The absence of epileptiform discharges does not rule out a clinical diagnosis of epilepsy.  Clinical correlation is advised.  CUS pending  LE venous doppler pending   PHYSICAL EXAM Physical exam  Temp:  [97.6 F (36.4 C)-98.6 F (37 C)] 98.6 F (37 C) (01/05 0450) Pulse Rate:  [46-61] 61 (01/05 0742) Resp:  [15-20] 18 (01/05 0450) BP: (149-162)/(60-79) 155/60 (01/05 0742) SpO2:  [98 %-100 %] 98 % (01/05 0450) Weight:  [196 lb 13.9 oz (89.3 kg)] 196 lb 13.9 oz (89.3 kg) (01/05 0450)  General - Well nourished, well developed, in no apparent distress.  Ophthalmologic -  Fundi not visualized due to eye movement.  Cardiovascular - Regular rate and rhythm.  Mental Status -  Level of arousal and orientation to time, place, and person were intact. Language including expression, naming, repetition, comprehension was assessed and found intact. Fund of Knowledge was assessed and was impaired.  Cranial Nerves II - XII - II - Visual field intact OU. III, IV, VI - Extraocular movements intact. V - Facial sensation intact bilaterally. VII - Facial movement intact bilaterally. VIII - Hearing & vestibular intact bilaterally. X - Palate elevates symmetrically. XI - Chin turning & shoulder shrug intact bilaterally. XII - Tongue protrusion intact.  Motor Strength - The patient's strength was 4+/5 in all extremities and pronator drift was absent.  Bulk was normal and fasciculations were absent.   Motor Tone - Muscle tone was assessed at the neck and appendages and was  normal.  Reflexes - The patient's reflexes were 1+ in all extremities and he had no pathological reflexes.  Sensory - Light touch, temperature/pinprick were assessed and were symmetrical.    Coordination - The patient had normal movements in the hands with no ataxia or dysmetria.  Tremor was absent.  Gait and Station - deferred   ASSESSMENT/PLAN [name] is a 71 y.o. 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 Emergent cardiac cath did not find lesions responsible for acute presentation. MRI revealed mult small R brain embolic infarcts  He did not receive IV t-PA.   Stroke:  R brain, anterior and posterior circulation infarcts, felt to be cardioembolic with recent cardiac event. Needs TEE and loop to rule out cardiac thrombus and afib  MRI  R MCA, R MCA/ACA, R PCA and R cerebellar punctate/patchy infarcts.   MRA  Moderate to severe stenosis R VA and B P2  Carotid Doppler  pending  2D Echo  EF 45-50%. No source of embolus   Recommend TEE to look for embolic source.  consider placement of an implantable loop recorder to evaluate for atrial fibrillation as etiology of stroke if no other source found.  LDL 140  HgbA1c 7.7  Heparin 5000 units sq tid for VTE prophylaxis  Diet Heart Room service appropriate? Yes; Fluid consistency: Thin  aspirin 325 mg daily and clopidogrel 75 mg daily prior to admission, now on aspirin 81 mg daily and clopidogrel 75 mg daily. Continue on discharge for stroke and cardiac prevention.  Patient counseled to be compliant with his antithrombotic medications  Ongoing aggressive stroke risk factor management  Therapy recommendations:  PT and OT recommended  Disposition:  pending   Seizure  GTC spell followed with postictal  Suspected prolonged  postictal state  Continue Keppra  EEG no seizure but right focal slowing seizure precautions. No driving until 6 month of seizure free. Do not participate in activities where a loss of awareness could hurt you or someone else.  No swimming alone, no tub bathing, no hot tubs, no driving, no operating motorized vehicles(cars, ATVs, motorbikes, etc), lawnmowers or power tools.  No standing at heights, such as rooftops, ladders or stairs. No sliding boards, monkey bars or swings, or climbing trees.  Avoid hot objects such as stoves, heaters, open fires.  Wear a helmet when riding a bicycle, scooter, skateboard, etc. and avoid areas of traffic.  Set water heater to  120 degrees or less.  Hypertension  Cardiology Restarted lisinopril after creatinine improved  Follow creatinine  Continuing low dose BB  BP goals from neuro standpoint - pts with stroke and end organ damage - our goals are 180/105  CAD s/p stent  Extensive hx of CAD   EF 45-50%  Cardiac cath showed multi vessel CAD but no acute intervention - mult PCI to RCA, CFx and RI w/ DES to LAD 2015; cath this adm w/ this adm w/ mult stents patent to LAD, ramus intermediate, LCx, prox RCA  Frequent PVCs  Suspect the seizure could be induced by arrhythmia   Cardiology on board  History of stroke  2015 MRI left frontal and right MCA/PCA punctate infarcts, old pontine, left thalamic and right CR lacunar infarcts  2 days post cardiac cath and stent placement. TEE and loop not completed.  CTA h/n unremarkable at that time  On DAPT and crestor at home  Bradycardia  On arrival  Resolved  Hyperlipidemia  Home meds:  crestor 10, fenofibrate and zetia  LDL 140, goal < 70  Changed crestor to lipitor 80 by primary service  Continue statin at discharge  Diabetes  HgbA1c 7.7, goal < 7.0  Uncontrolled  Diabetes nurse following - recommends half of home Levemir dose  SSI  Close PCP following  Other Stroke Risk  Factors  Advanced age  Obesity, Body mass index is 31.78 kg/m., recommend weight loss, diet and exercise as appropriate   Acute diastolic CHF  Hospital day # 2  Rosalin Hawking, MD PhD Stroke Neurology 01/17/2016 11:24 AM   To contact Stroke Continuity provider, please refer to [email or web address] After hours, contact General Neurology

## 2016-01-18 DIAGNOSIS — I638 Other cerebral infarction: Secondary | ICD-10-CM

## 2016-01-18 LAB — GLUCOSE, CAPILLARY
GLUCOSE-CAPILLARY: 150 mg/dL — AB (ref 65–99)
GLUCOSE-CAPILLARY: 165 mg/dL — AB (ref 65–99)
Glucose-Capillary: 208 mg/dL — ABNORMAL HIGH (ref 65–99)
Glucose-Capillary: 261 mg/dL — ABNORMAL HIGH (ref 65–99)

## 2016-01-18 LAB — BASIC METABOLIC PANEL
Anion gap: 8 (ref 5–15)
BUN: 20 mg/dL (ref 6–20)
CHLORIDE: 105 mmol/L (ref 101–111)
CO2: 23 mmol/L (ref 22–32)
CREATININE: 1.2 mg/dL (ref 0.61–1.24)
Calcium: 8.7 mg/dL — ABNORMAL LOW (ref 8.9–10.3)
GFR calc Af Amer: >60 mL/min (ref >60)
GFR calc non Af Amer: 59 mL/min — ABNORMAL LOW (ref >60)
GLUCOSE: 164 mg/dL — AB (ref 65–99)
Potassium: 3.4 mmol/L — ABNORMAL LOW (ref 3.5–5.1)
SODIUM: 136 mmol/L (ref 135–145)

## 2016-01-18 MED ORDER — POTASSIUM CHLORIDE CRYS ER 20 MEQ PO TBCR: 20.0000 meq | EXTENDED_RELEASE_TABLET | Freq: Once | ORAL | Status: DC

## 2016-01-18 MED ORDER — POTASSIUM CHLORIDE CRYS ER 20 MEQ PO TBCR
20.0000 meq | EXTENDED_RELEASE_TABLET | Freq: Every day | ORAL | Status: DC
  Administered 2016-01-18 – 2016-01-21 (×4): 20 meq via ORAL
  Filled 2016-01-18 (×4): qty 1

## 2016-01-18 MED ORDER — MAGNESIUM HYDROXIDE 400 MG/5ML PO SUSP
15.0000 mL | Freq: Every day | ORAL | Status: DC | PRN
  Administered 2016-01-18: 15 mL via ORAL
  Filled 2016-01-18: qty 30

## 2016-01-18 NOTE — Progress Notes (Signed)
STROKE TEAM PROGRESS NOTE   HISTORY OF PRESENT ILLNESS (per record) Carl Stephenson is an 71 y.o. male with no past medical history of seizure, head trauma, or febrile seizure. 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 Family and wife, patient had gotten up to go to the bathroom when suddenly he was noticed to fall down with both arms extended and legs extended, eyes rolling back, foaming at the mouth and bleeding at the mouth. Per wife this lasted for approximately 15 minutes and then patient was postictal and confused afterwards. EMS was called and patient was brought to the ED. On arrival to [facility] ED was taken to the cardiac cath lab which showed two-vessel obstructive CAD with diffuse mild LAD disease and diffuse disease in the first OM, multiple stents patent, and moderately elevated LVEDP.  In discussing with family patient does not smoke, patient does not drink, patient did not drink on New Year's Eve, patient does not do any illicit drugs. Patient does have issues with fluid behind the eyes and now having some disconjugate gaze twitches was to see a neurologist in the future for an MRI. As stated patient has never had a seizure in the past. Currently patient is alert, he is able to come ease at the hospital but does not have any recollection of what happened. He is having some difficulty following commands secondary to postictal state. EEG was obtained which did not show any overt seizure activity however formal reading is pending.  In the ED patient has not received any benzodiazepines from looking throughout the note and he has not received any antiepileptic medication. Patient has only had 1 seizure at this time.  Patient was not considered for IV t-PA secondary to non-stroke  presentation.    SUBJECTIVE (INTERVAL HISTORY) Wife is at bedside, no events overnight, patient stable without any complaints   OBJECTIV E Temp:  [98.1 F (36.7 C)-98.5 F (36.9 C)] 98.2 F (36.8 C) (01/06 0506) Pulse Rate:  [49-55] 50 (01/06 0506) Cardiac Rhythm: Heart block (01/05 1900) Resp:  [17-18] 18 (01/06 0506) BP: (133-154)/(54-63) 154/63 (01/06 0506) SpO2:  [97 %-99 %] 97 % (01/06 0506) Weight:  [87.1 kg (192 lb 0.3 oz)] 87.1 kg (192 lb 0.3 oz) (01/06 0506)  CBC:   Recent Labs Lab 01/15/16 0420 01/16/16 0353  WBC 8.6 8.7  NEUTROABS 6.8  --   HGB 13.2  12.9* 12.0*  HCT 39.8  38.0* 36.4*  MCV 77.7* 77.6*  PLT 172 0000000    Basic Metabolic Panel:   Recent Labs Lab 01/15/16 0420 01/15/16 1256 01/16/16 0353  NA 137  138  --  138  K 4.9  4.8  --  3.6  CL 106  104  --  107  CO2 20*  --  24  GLUCOSE 258*  255*  --  143*  BUN 22*  24*  --  21*  CREATININE 1.39*  1.50*  --  1.21  CALCIUM 8.7*  --  8.2*  MG  --  2.1  --     Lipid Panel:     Component Value Date/Time   CHOL 215 (H) 01/17/2016 0416   TRIG 238 (H) 01/17/2016 0416   HDL 27 (L) 01/17/2016 0416   CHOLHDL 8.0 01/17/2016 0416  VLDL 48 (H) 01/17/2016 0416   LDLCALC 140 (H) 01/17/2016 0416   HgbA1c:  Lab Results  Component Value Date   HGBA1C 7.7 (H) 01/16/2016   Urine Drug Screen:     Component Value Date/Time   LABOPIA NONE DETECTED 01/15/2016 1021   COCAINSCRNUR NONE DETECTED 01/15/2016 1021   LABBENZ NONE DETECTED 01/15/2016 1021   AMPHETMU NONE DETECTED 01/15/2016 1021   THCU NONE DETECTED 01/15/2016 1021   LABBARB NONE DETECTED 01/15/2016 1021      IMAGING I have personally reviewed the radiological images below and agree with the radiology interpretations.  Mr Brain Wo Contrast 01/16/2016 1. For small nodular foci of restricted diffusion in the right brain which most resemble small acute infarcts. These are present in the right MCA, right PCA, and right cerebellar  artery territories which could indicate sequelae of embolic event, although sparing of the left brain is noted.  2. No associated acute hemorrhage or mass effect. Underlying advanced chronic small vessel disease otherwise appears stable since 2015.   Mr Jodene Nam Head Wo Contrast 01/16/2016 1. Posterior circulation atherosclerosis with moderate to severe stenosis of the distal right vertebral artery just proximal to the vertebrobasilar junction. There is mild bilateral PCA P2 segment stenosis.  2. Anterior circulation without significant stenosis.    Dg Chest Port 1 View 01/16/2016 No acute abnormality noted.    2D Echocardiogram  - Procedure narrative: Transthoracic echocardiography. Technically difficult study with reduced echo windows. Definity contrast was administered. - Left ventricle: The cavity size was normal. Wall thickness was increased in a pattern of moderate LVH. Systolic function was mildly reduced. The estimated ejection fraction was in the range of 45% to 50%. Diffuse hypokinesis. The study is not technically sufficient to allow evaluation of LV diastolic function. - Aortic valve: Poorly visualized. - Left atrium: The atrium was mildly dilated. - Right atrium: The atrium was at the upper limits of normal in size. - Inferior vena cava: The vessel was normal in size. The respirophasic diameter changes were in the normal range (>= 50%), consistent with normal central venous pressure. Impressions:   Technically difficult study. LVEF appears similar to prior study in 2015. Difficult to assess wall motion due to frequent PVC&'s.  EEG  This awake and drowsy EEG is abnormal due to the presence of: 1. Moderate diffuse slowing of the waking background 2. Additional focal slowing over the right hemisphere Clinical Correlation of the above findings indicates diffuse cerebral dysfunction that is non-specific in etiology and can be seen with hypoxic/ischemic injury, toxic/metabolic  encephalopathies, neurodegenerative disorders, or medication effect. Additional focal slowing over the right hemisphere indicates focal cerebral dysfunction in this region suggestive of underlying structural or physiologic abnormality. The absence of epileptiform discharges does not rule out a clinical diagnosis of epilepsy.  Clinical correlation is advised.  CUS 01/17/2016 Findings suggest 1-39% internal carotid artery stenosis bilaterally. Vertebral arteries are patent with antegrade flow.   LE venous doppler pending 01/17/2016 Bilateral lower extremities are negative for deep vein thrombosis. There is no evidence of Baker's cyst bilaterally    PHYSICAL EXAM   Temp:  [98.1 F (36.7 C)-98.5 F (36.9 C)] 98.2 F (36.8 C) (01/06 0506) Pulse Rate:  [49-55] 50 (01/06 0506) Resp:  [17-18] 18 (01/06 0506) BP: (133-154)/(54-63) 154/63 (01/06 0506) SpO2:  [97 %-99 %] 97 % (01/06 0506) Weight:  [87.1 kg (192 lb 0.3 oz)] 87.1 kg (192 lb 0.3 oz) (01/06 0506)   Exam stable no changes  General - Well  nourished, well developed, in no apparent distress.  Ophthalmologic - Fundi not visualized due to eye movement.  Cardiovascular - Regular rate and rhythm.  Mental Status -  Level of arousal and orientation to time, place, and person were intact. Language including expression, naming, repetition, comprehension was assessed and found intact. Fund of Knowledge was assessed and was impaired.  Cranial Nerves II - XII - II - Visual field intact OU. III, IV, VI - Extraocular movements intact. V - Facial sensation intact bilaterally. VII - Facial movement intact bilaterally. VIII - Hearing & vestibular intact bilaterally. X - Palate elevates symmetrically. XI - Chin turning & shoulder shrug intact bilaterally. XII - Tongue protrusion intact.  Motor Strength - The patient's strength was equal and intact in all extremities and pronator drift was absent.  Bulk was normal and fasciculations were  absent.   Motor Tone - Muscle tone was assessed at the neck and appendages and was normal.  Reflexes - The patient's reflexes were 1+ in all extremities and he had no pathological reflexes.  Sensory - Light touch, temperature/pinprick were assessed and were symmetrical.    Coordination - The patient had normal movements in the hands with no ataxia or dysmetria.  Tremor was absent.  Gait and Station - deferred   ASSESSMENT/PLAN Carl Stephenson is a 71 y.o. 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 Emergent cardiac cath did not find lesions responsible for acute presentation. MRI revealed mult small R brain embolic infarcts  He did not receive IV t-PA.   Stroke:  R brain, anterior and posterior circulation infarcts, felt to be cardioembolic with recent cardiac event. Needs TEE and loop to rule out cardiac thrombus and afib  MRI  R MCA, R MCA/ACA, R PCA and R cerebellar punctate/patchy infarcts.   MRA  Moderate to severe stenosis R VA and B P2  Carotid Doppler - unremarkable  Bilateral lower extremity venous Dopplers - negative  2D Echo  EF 45-50%. No source of embolus   Recommend TEE to look for embolic source.  Consider placement of an implantable loop recorder to evaluate for atrial fibrillation as etiology of stroke if no other source found. Planned Monday.  LDL 140  HgbA1c 7.7  Heparin 5000 units sq tid for VTE prophylaxis Diet Heart Room service appropriate? Yes; Fluid consistency: Thin Diet NPO time specified  aspirin 325 mg daily and clopidogrel 75 mg daily prior to admission, now on aspirin 81 mg daily and clopidogrel 75 mg daily. Continue on discharge for stroke and cardiac prevention.  Patient counseled to be compliant with his antithrombotic medications  Ongoing aggressive  stroke risk factor management  Therapy recommendations: pending   Disposition:  pending   Seizure  GTC spell followed with postictal  Suspected prolonged postictal state  Continue Keppra  EEG no seizure but right focal slowing Seizure precautions. No driving until 6 month of seizure free. Do not participate in activities where a loss of awareness could hurt you or someone else.  No swimming alone, no tub bathing, no hot tubs, no driving, no operating motorized vehicles(cars, ATVs, motorbikes, etc), lawnmowers or power tools.  No standing at heights, such as rooftops, ladders or stairs. No sliding boards, monkey bars or swings, or climbing trees.  Avoid hot objects such as stoves, heaters, open  fires.  Wear a helmet when riding a bicycle, scooter, skateboard, etc. and avoid areas of traffic.  Set water heater to 120 degrees or less. No driving for 6 months per [place] or defer to locality where you live.  Hypertension  Cardiology restarted lisinopril after creatinine improved  Follow creatinine  Continuing low dose BB  BP goals from neuro standpoint - pts with stroke and end organ damage - our goals are 180/105  CAD s/p stent  Extensive hx of CAD   EF 45-50%  Cardiac cath showed multi vessel CAD but no acute intervention - mult PCI to RCA, CFx and RI w/ DES to LAD 2015; cath this adm w/ this adm w/ mult stents patent to LAD, ramus intermediate, LCx, prox RCA  Frequent PVCs  Suspect the seizure could be induced by arrhythmia   Cardiology on board  History of stroke  2015 MRI left frontal and right MCA/PCA punctate infarcts, old pontine, left thalamic and right CR lacunar infarcts  2 days post cardiac cath and stent placement. TEE and loop not completed.  CTA h/n unremarkable at that time  On DAPT and crestor at home  Bradycardia  On arrival  Resolved  Hyperlipidemia  Home meds:  crestor 10, fenofibrate and zetia  LDL 140, goal < 70  Changed crestor to lipitor  80 by primary service  Continue statin at discharge  Diabetes  HgbA1c 7.7, goal < 7.0  Uncontrolled  Diabetes nurse following - recommends half of home Levemir dose  SSI  Close PCP following  Other Stroke Risk Factors  Advanced age  Obesity, Body mass index is 30.99 kg/m., recommend weight loss, diet and exercise as appropriate   Acute diastolic CHF  Hospital day # 3   Personally examined patient and images, and have participated in and made any corrections needed to history, physical, neuro exam,assessment and plan as stated above.  I have personally obtained the history, evaluated lab date, reviewed imaging studies and agree with radiology interpretations.    Sarina Ill, MD Stroke Neurology   To contact Stroke Continuity provider, please refer to [email or web address] After hours, contact General Neurology

## 2016-01-18 NOTE — Evaluation (Signed)
Physical Therapy Evaluation Patient Details Name: Carl Stephenson MRN: [id number] DOB: November 06, 1945 Today's Date: 01/18/2016   History of Present Illness  Pt is a 71 y/o male admitted after falling at home secondary to a seizure and found to have had an acute CVA (R brain, anterior and posterior circulation infarcts). PMH including but not limited to previous CVA in 2012, HTN, CAD, DM, hx of MI in 2010 and stent placement in 2015.  Clinical Impression  Pt presented supine in bed with HOB elevated, awake and willing to participate in therapy session. Prior to admission, pt reported that he was independent with all functional mobility and ADLs. Pt currently requires min A with transfers for stability and min guard with ambulation without an AD. Pt would continue to benefit from skilled physical therapy services at this time while admitted and after d/c to address his below listed limitations in order to improve her overall safety and independence with functional mobility.      Follow Up Recommendations Home health PT;Supervision/Assistance - 24 hour    Equipment Recommendations  None recommended by PT    Recommendations for Other Services       Precautions / Restrictions Precautions Precautions: Fall Restrictions Weight Bearing Restrictions: No      Mobility  Bed Mobility Overal bed mobility: Needs Assistance Bed Mobility: Supine to Sit;Sit to Supine     Supine to sit: Min guard;HOB elevated Sit to supine: Min assist   General bed mobility comments: pt required increased time, use of bed rails, min guard for safety to achieve sitting EOB and min A with bilateral LEs to return to supine  Transfers Overall transfer level: Needs assistance Equipment used: None Transfers: Sit to/from Stand Sit to Stand: Min assist         General transfer comment: pt required increased time, min A for stability  Ambulation/Gait Ambulation/Gait assistance: Min guard Ambulation Distance  (Feet): 100 Feet Assistive device: 1 person hand held assist;None Gait Pattern/deviations: Step-through pattern;Decreased stride length Gait velocity: decreased Gait velocity interpretation: Below normal speed for age/gender General Gait Details: pt initially ambulating approximately 69' with one HHA and then progressed to no HHA with min guard for safety. pt very cautious with ambulation and mildly unsteady but no LOB.  Stairs            Wheelchair Mobility    Modified Rankin (Stroke Patients Only) Modified Rankin (Stroke Patients Only) Pre-Morbid Rankin Score: No significant disability Modified Rankin: Moderate disability     Balance Overall balance assessment: Needs assistance Sitting-balance support: Feet supported;No upper extremity supported Sitting balance-[name]: Fair     Standing balance support: During functional activity;No upper extremity supported Standing balance-[name]: Fair                               Pertinent Vitals/Pain Pain Assessment: No/denies pain    Home Living Family/patient expects to be discharged to:: Private residence Living Arrangements: Spouse/significant other Available Help at Discharge: Family;Available 24 hours/day Type of Home: House Home Access: Stairs to enter Entrance Stairs-Rails: None Entrance Stairs-Number of Steps: 1 Home Layout: One level Home Equipment: Clinical cytogeneticist - 2 wheels      Prior Function Level of Independence: Independent               Hand Dominance        Extremity/Trunk Assessment   Upper Extremity Assessment Upper Extremity Assessment: Defer to OT evaluation  Lower Extremity Assessment Lower Extremity Assessment: RLE deficits/detail;LLE deficits/detail RLE Deficits / Details: MMT revealed 4/5 for hip flexion, hip abduction, hip adduction, knee flexion, knee extension and ankle DF. Pt reported no impairments in sensation. LLE Deficits / Details: MMT revealed  4/5 for hip flexion, hip abduction, hip adduction, knee flexion, knee extension and ankle DF. Pt reported no impairments in sensation.       Communication   Communication: No difficulties  Cognition Arousal/Alertness: Awake/alert Behavior During Therapy: WFL for tasks assessed/performed Overall Cognitive Status: Within Functional Limits for tasks assessed                      General Comments      Exercises     Assessment/Plan    PT Assessment Patient needs continued PT services  PT Problem List Decreased strength;Decreased range of motion;Decreased activity tolerance;Decreased balance;Decreased mobility;Decreased coordination;Decreased knowledge of use of DME;Decreased safety awareness          PT Treatment Interventions DME instruction;Gait training;Stair training;Functional mobility training;Therapeutic activities;Therapeutic exercise;Balance training;Neuromuscular re-education;Patient/family education    PT Goals (Current goals can be found in the Care Plan section)  Acute Rehab PT Goals Patient Stated Goal: return home PT Goal Formulation: With patient/family Time For Goal Achievement: 02/01/16 Potential to Achieve Goals: Fair    Frequency Min 4X/week   Barriers to discharge        Co-evaluation               End of Session Equipment Utilized During Treatment: Gait belt Activity Tolerance: Patient tolerated treatment well Patient left: in bed;with call bell/phone within reach;with bed alarm set;with family/visitor present Nurse Communication: Mobility status         Time: [id number] PT Time Calculation (min) (ACUTE ONLY): 24 min   Charges:   PT Evaluation $PT Eval Moderate Complexity: 1 Procedure PT Treatments $Gait Training: 8-22 mins   PT G CodesClearnce Sorrel Derreck Wiltsey 01/18/2016, 4:49 PM Sherie Don, [place], DPT [phone]

## 2016-01-18 NOTE — Progress Notes (Addendum)
Patient ID: Carl Stephenson, male   DOB: 07/26/1945, 71 y.o.   MRN: [id number]  PROGRESS NOTE    MERVEL SPEYRER  [id number] DOB: 04/12/1945 DOA: 01/15/2016  PCP: [name], MD   Brief Narrative:  71yo man with PMHx of CAD with prior MI and PCI, type 2 DM, prior CVA in 2015, and bladder cancer who presented to the ED on 01/15/16 after being found unresponsive at home. Per family he went to the bathroom tonight and when he got back he sat down and then suddenly moaned out and sprawled out his arms and legs stiffly with his eyes rolling to the back of his head. Family denies any whole body shaking. Family states he was unresponsive so EMS was called. He was brought by [facility] with AMS and EKG changes with probable STEMI. Patient not complaining of any chest pain or shortness of breath. Upon arrival to [facility], he was taken to the cardiac cath lab which showed 2 vessel obstructive CAD with diffuse mid-LAD disease and diffuse disease in the 1st OM, multiple stents patent, and a moderately elevated LVEDP. Family reports he has not been acting himself for the last few days. He has been "spacing out" and they will have to redirect him to regain his attention. Family also notes decreased PO intake lately. Wife reports he was supposed to see a Neurologist due to his eyes crossing and supposedly "fluid in the eyes."   SIGNIFICANT EVENTS: 1/3>> Found unresponsive at home. EKG changes with apparent STEMI and taken to cath lab  1/3>> Cath without significant changes   Assessment & Plan:   Acute encephalopathy / Acute CVA R brain, anterior and posterior circulation infarcts, felt to be cardioembolic with recent cardiac event - MRI  R MCA, R MCA/ACA, R PCA and R cerebellar punctate/patchy infarcts - MRA  Moderate to severe stenosis R VA and B P2 - Carotid Doppler with findings suggest 1-39% internal carotid artery stenosis bilaterally. Vertebral arteries are patent with antegrade flow. - 2D Echo  EF  45-50%. No source of embolus - LE doppler with no DVT - Needs TEE and loop to rule out cardiac thrombus and afib, neuro to set up - LDL 140 - HgbA1c 7.7 - Heparin subQ for VTE prophylaxis  - Continue aspirin 81 mg daily and clopidogrel 75 mg daily. - Appreciate PT evaluation   Seizure - Suspected prolonged postictal state - Continue Keppra  - EEG no seizure but right focal slowing  Hypertension, essential  - Cardiology restarted lisinopril after creatinine improved - Monitor renal function  - BMP in am  CAD s/p stent - Extensive hx of CAD  - EF 45-50% - Cardiac cath showed multi vessel CAD but no acute intervention - mult PCI to RCA, CFx and RI w/ DES to LAD 2015; cath this adm w/ this adm w/ mult stents patent to LAD, ramus intermediate, LCx, prox RCA - Continue aspirin and plavix  History of stroke - 2015 MRI left frontal and right MCA/PCA punctate infarcts, old pontine, left thalamic and right CR lacunar infarcts - 2 days post cardiac cath and stent placement. TEE and loop not completed.  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 Multiple stents patent including the proximal LAD, ramus intermediate, LCx, and proximal RCA. Moderately elevated LVEDP  EEG  01/15/2016 - diffuse cerebral dysfunction that is non-specific in etiology and can be seen  with hypoxic/ischemic injury, toxic/metabolic encephalopathies, neurodegenerative disorders, or medication effect. Additional focal slowing over the right hemisphere indicates focal cerebral dysfunction in this region suggestive of underlying structural or physiologic abnormality.The absence of epileptiform discharges does not rule out a clinical diagnosis of epilepsy. Clinical correlation is advised.  2 D ECHO 01/16/2016 - EF 45-50%, diffuse hypokinesis, moderate LVH  LE doppler bilateral 01/17/2016 - negative for DVT  Carotid doppler 01/17/2016 - Findings suggest 1-39% internal carotid artery stenosis bilaterally. Vertebral arteries are patent with antegrade flow.  Antimicrobials:   None    Subjective: No overnight events.  Objective: Vitals:   01/17/16 1315 01/17/16 2131 01/18/16 0506 01/18/16 0847  BP: (!) 133/54 (!) 148/61 (!) 154/63 (!) 153/62  Pulse: (!) 55 (!) 49 (!) 50 (!) 53  Resp: 17 18 18    Temp: 98.1 F (36.7 C) 98.5 F (36.9 C) 98.2 F (36.8 C)   TempSrc: Oral Oral Oral   SpO2: 97% 99% 97%   Weight:   87.1 kg (192 lb 0.3 oz)   Height:        Intake/Output Summary (Last 24 hours) at 01/18/16 1235 Last data filed at 01/18/16 0900  Gross per 24 hour  Intake              480 ml  Output             1950 ml  Net            -1470 ml   Filed Weights   01/16/16 0500 01/17/16 0450 01/18/16 0506  Weight: 88 kg (194 lb 0.1 oz) 89.3 kg (196 lb 13.9 oz) 87.1 kg (192 lb 0.3 oz)    Examination:  General exam: Appears calm and comfortable, no distress   Respiratory system: No wheezing, no rhonchi  Cardiovascular system: S1 & S2 heard, Rate controlled  Gastrointestinal system: A(+) BS, non tender abd  Central nervous system: No focal neurological deficits. Extremities: Symmetric 5 x 5 power. Skin:Skin is warm and dry  Psychiatry: Mood & affect appropriate.   Data Reviewed: I have personally reviewed following labs and imaging studies  CBC:  Recent Labs Lab  01/15/16 0420 01/16/16 0353  WBC 8.6 8.7  NEUTROABS 6.8  --   HGB 13.2  12.9* 12.0*  HCT 39.8  38.0* 36.4*  MCV 77.7* 77.6*  PLT 172 0000000   Basic Metabolic Panel:  Recent Labs Lab 01/15/16 0420 01/15/16 1256 01/16/16 0353 01/18/16 0857  NA 137  138  --  138 136  K 4.9  4.8  --  3.6 3.4*  CL 106  104  --  107 105  CO2 20*  --  24 23  GLUCOSE 258*  255*  --  143* 164*  BUN 22*  24*  --  21* 20  CREATININE 1.39*  1.50*  --  1.21 1.20  CALCIUM 8.7*  --  8.2* 8.7*  MG  --  2.1  --   --    GFR: Estimated Creatinine Clearance: 58.4 mL/min (by C-G formula based on SCr of 1.2 mg/dL). Liver Function Tests:  Recent Labs Lab 01/15/16 0420  AST 20  ALT 13*  ALKPHOS 61  BILITOT 0.5  PROT 6.1*  ALBUMIN 3.3*   No results for input(s): LIPASE, AMYLASE in the last 168 hours.  Recent Labs Lab 01/15/16 1256  AMMONIA 38*   Coagulation Profile:  Recent Labs Lab 01/15/16 0420  INR 0.98   Cardiac Enzymes:  Recent Labs Lab 01/15/16 0420  TROPONINI 0.08*   BNP (last 3 results) No results for input(s): PROBNP in the last 8760 hours. HbA1C:  Recent Labs  01/16/16 1637  HGBA1C 7.7*   CBG:  Recent Labs Lab 01/17/16 1118 01/17/16 1734 01/17/16 2129 01/18/16 0642 01/18/16 1111  GLUCAP 187* 180* 188* 150* 165*   Lipid Profile:  Recent Labs  01/17/16 0416  CHOL 215*  HDL 27*  LDLCALC 140*  TRIG 238*  CHOLHDL 8.0   Thyroid Function Tests: No results for input(s): TSH, T4TOTAL, FREET4, T3FREE, THYROIDAB in the last 72 hours. Anemia Panel: No results for input(s): VITAMINB12, FOLATE, FERRITIN, TIBC, IRON, RETICCTPCT in the last 72 hours. Urine analysis:    Component Value Date/Time   COLORURINE YELLOW 01/15/2016 1021   APPEARANCEUR CLEAR 01/15/2016 1021   LABSPEC 1.045 (H) 01/15/2016 1021   PHURINE 6.0 01/15/2016 1021   GLUCOSEU >=500 (A) 01/15/2016 1021   HGBUR SMALL (A) 01/15/2016 1021   BILIRUBINUR NEGATIVE 01/15/2016 1021   KETONESUR  NEGATIVE 01/15/2016 1021   PROTEINUR 100 (A) 01/15/2016 1021   UROBILINOGEN 0.2 12/29/2013 0040   NITRITE NEGATIVE 01/15/2016 1021   LEUKOCYTESUR NEGATIVE 01/15/2016 1021   Sepsis Labs: @[email or web address] (procalcitonin:4,lacticidven:4)   MRSA PCR Screening     Status: None   Collection Time: 01/15/16  5:45 AM  Result Value Ref Range Status   MRSA by PCR NEGATIVE NEGATIVE Final  Culture, blood (routine x 2)     Status: None (Preliminary result)   Collection Time: 01/15/16 12:56 PM  Result Value Ref Range Status   Specimen Description BLOOD RIGHT HAND  Final   Special Requests IN PEDIATRIC BOTTLE  10CC  Final   Culture NO GROWTH 1 DAY  Final   Report Status PENDING  Incomplete  Culture, blood (routine x 2)     Status: None (Preliminary result)   Collection Time: 01/15/16  1:00 PM  Result Value Ref Range Status   Specimen Description BLOOD RIGHT HAND  Final   Special Requests IN PEDIATRIC BOTTLE  3CC  Final   Culture NO GROWTH 1 DAY  Final   Report Status PENDING  Incomplete      Radiology Studies: Ct Head Wo Contrast Result Date: 01/15/2016 No acute intracranial process. Chronic changes including moderate to severe chronic small vessel ischemic disease and old deep gray nuclei lacunar infarcts. Electronically Signed   By: Elon Alas M.D.   On: 01/15/2016 04:57   Mr Jodene Nam Head Wo Contrast Result Date: 01/16/2016 1. Posterior circulation atherosclerosis with moderate to severe stenosis of the distal right vertebral artery just proximal to the vertebrobasilar junction. There is mild bilateral PCA P2 segment stenosis. 2. Anterior circulation without significant stenosis. Electronically Signed   By: Genevie Ann M.D.   On: 01/16/2016 20:54   Mr Brain Wo Contrast Result Date: 01/16/2016 1. For small nodular foci of restricted diffusion in the right brain which most resemble small acute infarcts.  These are present in the right MCA, right PCA, and right cerebellar artery territories which could  indicate sequelae of embolic event, although sparing of the left brain is noted. 2. No associated acute hemorrhage or mass effect. Underlying advanced chronic small vessel disease otherwise appears stable since 2015. Electronically Signed   By: Genevie Ann M.D.   On: 01/16/2016 13:38   Dg Chest Port 1 View Result Date: 01/16/2016 No acute abnormality noted. Electronically Signed   By: Inez Catalina M.D.   On: 01/16/2016 07:21      Scheduled Meds: . aspirin EC  81 mg Oral Daily  . atorvastatin  80 mg Oral q1800  . carvedilol  3.125 mg Oral BID WC  . clopidogrel  75 mg Oral Q breakfast  . fenofibrate  160 mg Oral Daily  . furosemide  40 mg Oral Daily  . heparin  5,000 Units Subcutaneous Q8H  . insulin aspart  0-15 Units Subcutaneous TID WC  . insulin aspart  0-5 Units Subcutaneous QHS  . levETIRAcetam  500 mg Oral BID  . lisinopril  10 mg Oral Daily  . pantoprazole  40 mg Oral Daily  . potassium chloride  20 mEq Oral Daily  . sertraline  100 mg Oral Daily   Continuous Infusions:   LOS: 3 days    Time spent: 15 minutes  Greater than 50% of the time spent on counseling and coordinating the care.   Leisa Lenz, MD [facility] Pager [phone]  If 7PM-7AM, please contact night-coverage [email or web address] Password [email or web address] 01/18/2016, 12:35 PM

## 2016-01-19 LAB — GLUCOSE, CAPILLARY
GLUCOSE-CAPILLARY: 176 mg/dL — AB (ref 65–99)
GLUCOSE-CAPILLARY: 184 mg/dL — AB (ref 65–99)
GLUCOSE-CAPILLARY: 191 mg/dL — AB (ref 65–99)
Glucose-Capillary: 190 mg/dL — ABNORMAL HIGH (ref 65–99)
Glucose-Capillary: 277 mg/dL — ABNORMAL HIGH (ref 65–99)

## 2016-01-19 LAB — BASIC METABOLIC PANEL
ANION GAP: 9 (ref 5–15)
BUN: 23 mg/dL — ABNORMAL HIGH (ref 6–20)
CALCIUM: 8.8 mg/dL — AB (ref 8.9–10.3)
CO2: 25 mmol/L (ref 22–32)
CREATININE: 1.37 mg/dL — AB (ref 0.61–1.24)
Chloride: 104 mmol/L (ref 101–111)
GFR calc Af Amer: 58 mL/min — ABNORMAL LOW (ref >60)
GFR, EST NON AFRICAN AMERICAN: 50 mL/min — AB (ref >60)
Glucose, Bld: 190 mg/dL — ABNORMAL HIGH (ref 65–99)
Potassium: 4.1 mmol/L (ref 3.5–5.1)
Sodium: 138 mmol/L (ref 135–145)

## 2016-01-19 MED ORDER — HYDRALAZINE HCL 20 MG/ML IJ SOLN: 5.0000 mg | Freq: Four times a day (QID) | INTRAMUSCULAR | Status: DC | PRN

## 2016-01-19 MED ORDER — SODIUM CHLORIDE 0.9 % IV SOLN
INTRAVENOUS | Status: DC
  Administered 2016-01-20: 03:00:00 via INTRAVENOUS

## 2016-01-19 MED ORDER — GUAIFENESIN-DM 100-10 MG/5ML PO SYRP: 5.0000 mL | ORAL_SOLUTION | ORAL | Status: DC | PRN

## 2016-01-19 NOTE — Progress Notes (Addendum)
Patient ID: Carl Stephenson, male   DOB: Jul 21, 1945, 71 y.o.   MRN: [id number]  PROGRESS NOTE    Carl Stephenson  [id number] DOB: May 17, 1945 DOA: 01/15/2016  PCP: Carl Held, MD   Brief Narrative:  71yo man with PMHx of CAD with prior MI and PCI, type 2 DM, prior CVA in 2015, and bladder cancer who presented to the ED on 01/15/16 after being found unresponsive at home. Per family he went to the bathroom tonight and when he got back he sat down and then suddenly moaned out and sprawled out his arms and legs stiffly with his eyes rolling to the back of his head. Family denies any whole body shaking. Family states he was unresponsive so EMS was called. He was brought by [facility] with AMS and EKG changes with probable STEMI. Patient not complaining of any chest pain or shortness of breath. Upon arrival to [facility], he was taken to the cardiac cath lab which showed 2 vessel obstructive CAD with diffuse mid-LAD disease and diffuse disease in the 1st OM, multiple stents patent, and a moderately elevated LVEDP. Family reports he has not been acting himself for the last few days. He has been "spacing out" and they will have to redirect him to regain his attention. Family also notes decreased PO intake lately. Wife reports he was supposed to see a Neurologist due to his eyes crossing and supposedly "fluid in the eyes."   SIGNIFICANT EVENTS: 1/3>> Found unresponsive at home. EKG changes with apparent STEMI and taken to cath lab  1/3>> Cath without significant changes   Assessment & Plan:   Acute encephalopathy / Acute CVA R brain, anterior and posterior circulation infarcts, felt to be cardioembolic with recent cardiac event - MRI  R MCA, R MCA/ACA, R PCA and R cerebellar punctate/patchy infarcts - MRA  Moderate to severe stenosis R VA and B P2 - Carotid Doppler showed 1-39% internal carotid artery stenosis bilaterally. Vertebral arteries are patent with antegrade flow. - 2D Echo  EF 45-50% - LE  doppler with no DVT - Plan for TEE and loop to rule out cardiac thrombus and afib - LDL 140 - HgbA1c 7.7 - Heparin subQ for VTE prophylaxis  - We will continue aspirin 81 mg daily and clopidogrel 75 mg daily. - Appreciate PT eval and recommendations   Seizure - Suspected prolonged postictal state - Continue Keppra  - EEG no seizure but right focal slowing  Hypertension, essential  - Cardiology restarted lisinopril after creatinine improved but cr up again at 1.37 so will hold lisinopril - He is also on lasix but will first hold lisinopril and if cr does not improve will need to adjust lasix dose as well - Check BMP in am  CAD s/p stent - Extensive hx of CAD  - EF 45-50% - Cardiac cath showed multi vessel CAD but no acute intervention - mult PCI to RCA, CFx and RI w/ DES to LAD 2015; cath this adm w/ this adm w/ mult stents patent to LAD, ramus intermediate, LCx, prox RCA - Continue aspirin and plavix  History of stroke - 2015 MRI left frontal and right MCA/PCA punctate infarcts, old pontine, left thalamic and right CR lacunar infarcts - 2 days post cardiac cath and stent placement. TEE and loop not completed.  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 Multiple stents patent including the proximal LAD, ramus intermediate, LCx, and proximal RCA. Moderately elevated LVEDP  EEG  01/15/2016 -  diffuse cerebral dysfunction that is non-specific in etiology and can be seen with hypoxic/ischemic injury, toxic/metabolic encephalopathies, neurodegenerative disorders, or medication effect. Additional focal slowing over the right hemisphere indicates focal cerebral dysfunction in this region suggestive of underlying structural or physiologic abnormality.The absence of epileptiform discharges does not rule out a clinical diagnosis of epilepsy. Clinical correlation is advised.  2 D ECHO 01/16/2016 - EF 45-50%, diffuse hypokinesis, moderate LVH  LE doppler bilateral 01/17/2016 - negative for DVT  Carotid doppler 01/17/2016 - Findings suggest 1-39% internal carotid artery stenosis bilaterally. Vertebral arteries are patent with antegrade flow.  Antimicrobials:   None    Subjective: No overnight events.  Objective: Vitals:   01/18/16 0506 01/18/16 0847 01/18/16 2104 01/19/16 0548  BP: (!) 154/63 (!) 153/62 (!) 151/58 (!) 165/67  Pulse: (!) 50 (!) 53 (!) 54 (!) 51  Resp: 18  18 18   Temp: 98.2 F (36.8 C)  98 F (36.7 C) 98 F (36.7 C)  TempSrc: Oral  Oral Oral  SpO2: 97%  97% 95%  Weight: 87.1 kg (192 lb 0.3 oz)   87 kg (191 lb 12.8 oz)  Height:        Intake/Output Summary (Last 24 hours) at 01/19/16 1022 Last data filed at 01/19/16 0554  Gross per 24 hour  Intake              360 ml  Output             1350 ml  Net             -990 ml   Filed Weights   01/17/16 0450 01/18/16 0506 01/19/16 0548  Weight: 89.3 kg (196 lb 13.9 oz) 87.1 kg (192 lb 0.3 oz) 87 kg (191 lb 12.8 oz)    Examination:  General exam: No distress, calm Respiratory system: Bilateral air entry, no wheezing  Cardiovascular system: S1 & S2 heard, Rate controlled  Gastrointestinal system: (+) BS, nontender, non distended  Central nervous system: Nonfocal Extremities: Symmetric 5 x 5 power. LE non tender Skin: No lesions or ulcers; warm and dry Psychiatry: Mood & affect appropriate. No agitation or  restlessness   Data Reviewed: I have personally reviewed following labs and imaging studies  CBC:  Recent Labs Lab 01/15/16 0420 01/16/16 0353  WBC 8.6 8.7  NEUTROABS 6.8  --   HGB 13.2  12.9* 12.0*  HCT 39.8  38.0* 36.4*  MCV 77.7* 77.6*  PLT 172 0000000   Basic Metabolic Panel:  Recent Labs Lab 01/15/16 0420 01/15/16 1256 01/16/16 0353 01/18/16 0857 01/19/16 0258  NA 137  138  --  138 136 138  K 4.9  4.8  --  3.6 3.4* 4.1  CL 106  104  --  107 105 104  CO2 20*  --  24 23 25   GLUCOSE 258*  255*  --  143* 164* 190*  BUN 22*  24*  --  21* 20 23*  CREATININE 1.39*  1.50*  --  1.21 1.20 1.37*  CALCIUM 8.7*  --  8.2* 8.7* 8.8*  MG  --  2.1  --   --   --    GFR: Estimated Creatinine Clearance: 51.1 mL/min (by C-G formula based on SCr of 1.37 mg/dL (H)). Liver Function Tests:  Recent Labs Lab 01/15/16 0420  AST 20  ALT 13*  ALKPHOS 61  BILITOT 0.5  PROT 6.1*  ALBUMIN 3.3*   No results for input(s): LIPASE, AMYLASE in the last 168 hours.  Recent Labs Lab 01/15/16 1256  AMMONIA 38*   Coagulation Profile:  Recent Labs Lab 01/15/16 0420  INR 0.98   Cardiac Enzymes:  Recent Labs Lab 01/15/16 0420  TROPONINI 0.08*   BNP (last 3 results) No results for input(s): PROBNP in the last 8760 hours. HbA1C:  Recent Labs  01/16/16 1637  HGBA1C 7.7*   CBG:  Recent Labs Lab 01/18/16 1111 01/18/16 1613 01/18/16 2143 01/19/16 0547 01/19/16 0743  GLUCAP 165* 208* 261* 176* 184*   Lipid Profile:  Recent Labs  01/17/16 0416  CHOL 215*  HDL 27*  LDLCALC 140*  TRIG 238*  CHOLHDL 8.0   Thyroid Function Tests: No results for input(s): TSH, T4TOTAL, FREET4, T3FREE, THYROIDAB in the last 72 hours. Anemia Panel: No results for input(s): VITAMINB12, FOLATE, FERRITIN, TIBC, IRON, RETICCTPCT in the last 72 hours. Urine analysis:    Component Value Date/Time   COLORURINE YELLOW 01/15/2016 1021   APPEARANCEUR CLEAR 01/15/2016 1021   LABSPEC  1.045 (H) 01/15/2016 1021   PHURINE 6.0 01/15/2016 1021   GLUCOSEU >=500 (A) 01/15/2016 1021   HGBUR SMALL (A) 01/15/2016 1021   BILIRUBINUR NEGATIVE 01/15/2016 1021   KETONESUR NEGATIVE 01/15/2016 1021   PROTEINUR 100 (A) 01/15/2016 1021   UROBILINOGEN 0.2 12/29/2013 0040   NITRITE NEGATIVE 01/15/2016 1021   LEUKOCYTESUR NEGATIVE 01/15/2016 1021   Sepsis Labs: @[email or web address] (procalcitonin:4,lacticidven:4)   MRSA PCR Screening     Status: None   Collection Time: 01/15/16  5:45 AM  Result Value Ref Range Status   MRSA by PCR NEGATIVE NEGATIVE Final  Culture, blood (routine x 2)     Status: None (Preliminary result)   Collection Time: 01/15/16 12:56 PM  Result Value Ref Range Status   Specimen Description BLOOD RIGHT HAND  Final   Special Requests IN PEDIATRIC BOTTLE  10CC  Final   Culture NO GROWTH 1 DAY  Final   Report Status PENDING  Incomplete  Culture, blood (routine x 2)     Status: None (Preliminary result)   Collection Time: 01/15/16  1:00 PM  Result Value Ref Range Status   Specimen Description BLOOD RIGHT HAND  Final   Special Requests IN PEDIATRIC BOTTLE  3CC  Final   Culture NO GROWTH 1 DAY  Final   Report Status PENDING  Incomplete      Radiology Studies: Ct Head Wo Contrast Result Date: 01/15/2016 No acute intracranial process. Chronic changes including moderate to severe chronic small vessel ischemic disease and old deep gray nuclei lacunar infarcts. Electronically Signed   By: Elon Alas M.D.   On: 01/15/2016 04:57   Mr Jodene Nam Head Wo Contrast Result Date: 01/16/2016 1. Posterior circulation atherosclerosis with moderate to severe stenosis of the distal right vertebral artery just proximal to the vertebrobasilar junction. There is mild bilateral PCA P2 segment stenosis. 2. Anterior circulation without significant stenosis. Electronically Signed  By: Genevie Carl M.D.   On: 01/16/2016 20:54   Mr Brain Wo Contrast Result Date: 01/16/2016 1. For small nodular foci of  restricted diffusion in the right brain which most resemble small acute infarcts. These are present in the right MCA, right PCA, and right cerebellar artery territories which could indicate sequelae of embolic event, although sparing of the left brain is noted. 2. No associated acute hemorrhage or mass effect. Underlying advanced chronic small vessel disease otherwise appears stable since 2015. Electronically Signed   By: Genevie Carl M.D.   On: 01/16/2016 13:38   Dg Chest Port 1 View Result Date: 01/16/2016 No acute abnormality noted. Electronically Signed   By: Inez Catalina M.D.   On: 01/16/2016 07:21      Scheduled Meds: . aspirin EC  81 mg Oral Daily  . atorvastatin  80 mg Oral q1800  . carvedilol  3.125 mg Oral BID WC  . clopidogrel  75 mg Oral Q breakfast  . fenofibrate  160 mg Oral Daily  . furosemide  40 mg Oral Daily  . heparin  5,000 Units Subcutaneous Q8H  . insulin aspart  0-15 Units Subcutaneous TID WC  . insulin aspart  0-5 Units Subcutaneous QHS  . levETIRAcetam  500 mg Oral BID  . pantoprazole  40 mg Oral Daily  . potassium chloride  20 mEq Oral Daily  . sertraline  100 mg Oral Daily   Continuous Infusions:   LOS: 4 days    Time spent: 15 minutes  Greater than 50% of the time spent on counseling and coordinating the care.   Leisa Lenz, MD [facility] Pager [phone]  If 7PM-7AM, please contact night-coverage [email or web address] Password [email or web address] 01/19/2016, 10:22 AM

## 2016-01-19 NOTE — Evaluation (Signed)
Occupational Therapy Evaluation Patient Details Name: Carl Stephenson MRN: [id number] DOB: 10/10/1945 Today's Date: 01/19/2016    History of Present Illness Pt is a 71 y/o male admitted after falling at home secondary to a seizure and found to have had an acute CVA (R brain, anterior and posterior circulation infarcts). PMH including but not limited to previous CVA in 2012, HTN, CAD, DM, hx of MI in 2010 and stent placement in 2015.   Clinical Impression   Pt admitted with the above diagnoses and presents with below problem list. Pt will benefit from continued acute OT to address the below listed deficits and maximize independence with basic ADLs prior to d/c home with family assisting. PTA pt was independent with ADLs. Pt is currently supervision to min A with most ADLs, likely mod A for tub shower transfer. Pt presents with generalized weakness and some impaired cognition (vs. Lethargy?). Spouse present and included in education. Session details below.      Follow Up Recommendations  Home health OT;Supervision/Assistance - 24 hour    Equipment Recommendations  3 in 1 bedside commode    Recommendations for Other Services       Precautions / Restrictions Precautions Precautions: Fall Restrictions Weight Bearing Restrictions: No      Mobility Bed Mobility Overal bed mobility: Needs Assistance Bed Mobility: Supine to Sit;Sit to Supine     Supine to sit: Min assist;HOB elevated Sit to supine: Min assist   General bed mobility comments: Consistent cueing needed for sequencing coming to EOB. Pt using bed rail and therapist to powerup to EOB. Pt requesting assist to advance BLE back onto bed to return to supine.   Transfers Overall transfer level: Needs assistance Equipment used: None Transfers: Sit to/from Stand Sit to Stand: Min assist         General transfer comment: pt required increased time, min A for stability    Balance Overall balance assessment: Needs  assistance Sitting-balance support: Feet supported;No upper extremity supported Sitting balance-[name]: Fair Sitting balance - Comments: trunk flexion noted sitting EOB   Standing balance support: During functional activity;No upper extremity supported Standing balance-[name]: Fair Standing balance comment: occasional external support. instability on turns noted.                             ADL Overall ADL's : Needs assistance/impaired Eating/Feeding: Set up;Sitting   Grooming: Set up;Supervision/safety;Sitting   Upper Body Bathing: Minimal assistance;Sitting   Lower Body Bathing: Minimal assistance;Sit to/from stand   Upper Body Dressing : Minimal assistance;Sitting   Lower Body Dressing: Minimal assistance;Sit to/from stand   Toilet Transfer: Minimal assistance;Ambulation;Regular Toilet;Grab bars   Toileting- Clothing Manipulation and Hygiene: Min guard;Minimal assistance;Sitting/lateral lean;Sit to/from stand   Tub/ Banker: Tub transfer;Moderate assistance;Ambulation   Functional mobility during ADLs: Min guard;Minimal assistance General ADL Comments: Pt completed bed mobility, in-room functional mobility, toilet transfer, and washed hands at sink as detailed above. min guard to min A during transfers/mobility.      Vision     Perception     Praxis      Pertinent Vitals/Pain Pain Assessment: No/denies pain     Hand Dominance Right   Extremity/Trunk Assessment Upper Extremity Assessment Upper Extremity Assessment: Generalized weakness (baseline limited AROM B shoulder level to about 80*)   Lower Extremity Assessment Lower Extremity Assessment: Defer to PT evaluation       Communication Communication Communication: Other (comment) (low volume,  minimal verbalizations (baseline?))   Cognition Arousal/Alertness: Awake/alert;Lethargic (initally lethargic, more alert once EOB/OOB) Behavior During Therapy: WFL for tasks  assessed/performed Overall Cognitive Status: Impaired/Different from baseline Area of Impairment: Attention;Following commands;Safety/judgement;Problem solving   Current Attention Level: Sustained   Following Commands: Follows multi-step commands inconsistently Safety/Judgement: Decreased awareness of deficits;Decreased awareness of safety   Problem Solving: Slow processing;Difficulty sequencing;Decreased initiation;Requires verbal cues;Requires tactile cues General Comments: Pt answered all orientation questions accurately except he stated the year was "1947." Pt's spouse verbalized that pt may have thought his year of birth (1947) was being asked. Initially pt needing continuous cueing to complete bed mobility. Pt was asleep upon therapist arrival. Pt wiped hands and face with washcloth to help increase alertness before coming to EOB. Difficulty sequencing at times. Lethargy vs. impaired cognition?   General Comments       Exercises       Shoulder Instructions      Home Living Family/patient expects to be discharged to:: Private residence Living Arrangements: Spouse/significant other Available Help at Discharge: Family;Available 24 hours/day Type of Home: House Home Access: Stairs to enter [employer] of Steps: 1 Entrance Stairs-Rails: None Home Layout: One level     Bathroom Shower/Tub: Occupational psychologist: Standard Bathroom Accessibility: Yes   Home Equipment: Clinical cytogeneticist - 2 wheels          Prior Functioning/Environment Level of Independence: Independent                 OT Problem List: Decreased activity tolerance;Impaired balance (sitting and/or standing);Decreased cognition;Decreased safety awareness;Decreased knowledge of use of DME or AE;Decreased knowledge of precautions;Impaired UE functional use   OT Treatment/Interventions: Self-care/ADL training;Therapeutic exercise;Energy conservation;DME and/or AE  instruction;Therapeutic activities;Cognitive remediation/compensation;Patient/family education;Balance training    OT Goals(Current goals can be found in the care plan section) Acute Rehab OT Goals Patient Stated Goal: return home OT Goal Formulation: With patient/family Time For Goal Achievement: 02/02/16 Potential to Achieve Goals: Good ADL Goals Pt Will Perform Grooming: with modified independence;sitting (2-3 grooming tasks) Pt Will Perform Upper Body Bathing: with modified independence;sitting Pt Will Perform Lower Body Bathing: with modified independence;sit to/from stand Pt Will Perform Upper Body Dressing: with modified independence;sitting Pt Will Perform Lower Body Dressing: with modified independence;sit to/from stand Pt Will Transfer to Toilet: with modified independence;ambulating Pt Will Perform Toileting - Clothing Manipulation and hygiene: with modified independence;sit to/from stand Pt Will Perform Tub/Shower Transfer: with supervision;ambulating;shower seat  OT Frequency: Min 2X/week   Barriers to D/C:            Co-evaluation              End of Session Equipment Utilized During Treatment: Gait belt Nurse Communication: Other (comment);Mobility status (cognition vs. lethargy)  Activity Tolerance: Patient limited by lethargy;Patient tolerated treatment well Patient left: in bed;with call bell/phone within reach;with family/visitor present   Time: [id number] OT Time Calculation (min): 25 min Charges:  OT General Charges $OT Visit: 1 Procedure OT Evaluation $OT Eval Low Complexity: 1 Procedure OT Treatments $Self Care/Home Management : 8-22 mins G-Codes:    Hortencia Pilar 10-Feb-2016, 4:31 PM

## 2016-01-19 NOTE — Progress Notes (Signed)
STROKE TEAM PROGRESS NOTE   HISTORY OF PRESENT ILLNESS (per record) Carl Stephenson is an 71 y.o. male with no past medical history of seizure, head trauma, or febrile seizure. 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 Family and wife, patient had gotten up to go to the bathroom when suddenly he was noticed to fall down with both arms extended and legs extended, eyes rolling back, foaming at the mouth and bleeding at the mouth. Per wife this lasted for approximately 15 minutes and then patient was postictal and confused afterwards. EMS was called and patient was brought to the ED. On arrival to [facility] ED was taken to the cardiac cath lab which showed two-vessel obstructive CAD with diffuse mild LAD disease and diffuse disease in the first OM, multiple stents patent, and moderately elevated LVEDP.  In discussing with family patient does not smoke, patient does not drink, patient did not drink on New Year's Eve, patient does not do any illicit drugs. Patient does have issues with fluid behind the eyes and now having some disconjugate gaze twitches was to see a neurologist in the future for an MRI. As stated patient has never had a seizure in the past. Currently patient is alert, he is able to come ease at the hospital but does not have any recollection of what happened. He is having some difficulty following commands secondary to postictal state. EEG was obtained which did not show any overt seizure activity however formal reading is pending.  In the ED patient has not received any benzodiazepines from looking throughout the note and he has not received any antiepileptic medication. Patient has only had 1 seizure at this time.  Patient was not considered for IV t-PA secondary to non-stroke  presentation.    SUBJECTIVE (INTERVAL HISTORY) No family at bedside today, he is alert and awake and eating breakfast,  no events overnight, patient stable without any complaints   OBJECTIV E Temp:  [98 F (36.7 C)] 98 F (36.7 C) (01/07 0548) Pulse Rate:  [51-54] 51 (01/07 0548) Cardiac Rhythm: Sinus bradycardia;Heart block (01/07 0701) Resp:  [18] 18 (01/07 0548) BP: (151-165)/(58-67) 165/67 (01/07 0548) SpO2:  [95 %-97 %] 95 % (01/07 0548) Weight:  [87 kg (191 lb 12.8 oz)] 87 kg (191 lb 12.8 oz) (01/07 0548)  CBC:   Recent Labs Lab 01/15/16 0420 01/16/16 0353  WBC 8.6 8.7  NEUTROABS 6.8  --   HGB 13.2  12.9* 12.0*  HCT 39.8  38.0* 36.4*  MCV 77.7* 77.6*  PLT 172 0000000    Basic Metabolic Panel:   Recent Labs Lab 01/15/16 1256  01/18/16 0857 01/19/16 0258  NA  --   < > 136 138  K  --   < > 3.4* 4.1  CL  --   < > 105 104  CO2  --   < > 23 25  GLUCOSE  --   < > 164* 190*  BUN  --   < > 20 23*  CREATININE  --   < > 1.20 1.37*  CALCIUM  --   < > 8.7* 8.8*  MG 2.1  --   --   --   < > = values in this interval not displayed.  Lipid Panel:  Component Value Date/Time   CHOL 215 (H) 01/17/2016 0416   TRIG 238 (H) 01/17/2016 0416   HDL 27 (L) 01/17/2016 0416   CHOLHDL 8.0 01/17/2016 0416   VLDL 48 (H) 01/17/2016 0416   LDLCALC 140 (H) 01/17/2016 0416   HgbA1c:  Lab Results  Component Value Date   HGBA1C 7.7 (H) 01/16/2016   Urine Drug Screen:     Component Value Date/Time   LABOPIA NONE DETECTED 01/15/2016 1021   COCAINSCRNUR NONE DETECTED 01/15/2016 1021   LABBENZ NONE DETECTED 01/15/2016 1021   AMPHETMU NONE DETECTED 01/15/2016 1021   THCU NONE DETECTED 01/15/2016 1021   LABBARB NONE DETECTED 01/15/2016 1021      IMAGING I have personally reviewed the radiological images below and agree with the radiology interpretations.  Mr Brain Wo Contrast 01/16/2016 1. For small nodular foci of restricted diffusion in the right brain which most resemble  small acute infarcts. These are present in the right MCA, right PCA, and right cerebellar artery territories which could indicate sequelae of embolic event, although sparing of the left brain is noted.  2. No associated acute hemorrhage or mass effect. Underlying advanced chronic small vessel disease otherwise appears stable since 2015.   Mr Jodene Nam Head Wo Contrast 01/16/2016 1. Posterior circulation atherosclerosis with moderate to severe stenosis of the distal right vertebral artery just proximal to the vertebrobasilar junction. There is mild bilateral PCA P2 segment stenosis.  2. Anterior circulation without significant stenosis.    Dg Chest Port 1 View 01/16/2016 No acute abnormality noted.    2D Echocardiogram  - Procedure narrative: Transthoracic echocardiography. Technically difficult study with reduced echo windows. Definity contrast was administered. - Left ventricle: The cavity size was normal. Wall thickness was increased in a pattern of moderate LVH. Systolic function was mildly reduced. The estimated ejection fraction was in the range of 45% to 50%. Diffuse hypokinesis. The study is not technically sufficient to allow evaluation of LV diastolic function. - Aortic valve: Poorly visualized. - Left atrium: The atrium was mildly dilated. - Right atrium: The atrium was at the upper limits of normal in size. - Inferior vena cava: The vessel was normal in size. The respirophasic diameter changes were in the normal range (>= 50%), consistent with normal central venous pressure. Impressions:   Technically difficult study. LVEF appears similar to prior study in 2015. Difficult to assess wall motion due to frequent PVC&'s.  EEG  This awake and drowsy EEG is abnormal due to the presence of: 1. Moderate diffuse slowing of the waking background 2. Additional focal slowing over the right hemisphere Clinical Correlation of the above findings indicates diffuse cerebral dysfunction that is  non-specific in etiology and can be seen with hypoxic/ischemic injury, toxic/metabolic encephalopathies, neurodegenerative disorders, or medication effect. Additional focal slowing over the right hemisphere indicates focal cerebral dysfunction in this region suggestive of underlying structural or physiologic abnormality. The absence of epileptiform discharges does not rule out a clinical diagnosis of epilepsy.  Clinical correlation is advised.  CUS 01/17/2016 Findings suggest 1-39% internal carotid artery stenosis bilaterally. Vertebral arteries are patent with antegrade flow.   LE venous doppler pending 01/17/2016 Bilateral lower extremities are negative for deep vein thrombosis. There is no evidence of Baker's cyst bilaterally    PHYSICAL EXAM   Temp:  [98 F (36.7 C)] 98 F (36.7 C) (01/07 0548) Pulse Rate:  [51-54] 51 (01/07 0548) Resp:  [18] 18 (01/07 0548) BP: (151-165)/(58-67) 165/67 (01/07 0548) SpO2:  [95 %-97 %] 95 % (  01/07 0548) Weight:  [87 kg (191 lb 12.8 oz)] 87 kg (191 lb 12.8 oz) (01/07 0548)   Exam stable no changes  General - Well nourished, well developed, in no apparent distress.  Ophthalmologic - Fundi not visualized due to eye movement.  Cardiovascular - Regular rate and rhythm.  Mental Status -  Level of arousal and orientation to time, place, and person were intact. Language including expression, naming, repetition, comprehension was assessed and found intact. Fund of Knowledge was assessed and was impaired.  Cranial Nerves II - XII - II - Visual field intact OU. III, IV, VI - Extraocular movements intact. V - Facial sensation intact bilaterally. VII - Facial movement intact bilaterally. VIII - Hearing & vestibular intact bilaterally. X - Palate elevates symmetrically. XI - Chin turning & shoulder shrug intact bilaterally. XII - Tongue protrusion intact.  Motor Strength - The patient's strength was equal and intact in all extremities and pronator  drift was absent.  Bulk was normal and fasciculations were absent.   Motor Tone - Muscle tone was assessed at the neck and appendages and was normal.  Reflexes - The patient's reflexes were 1+ in all extremities and he had no pathological reflexes.  Sensory - Light touch, temperature/pinprick were assessed and were symmetrical.    Coordination - The patient had normal movements in the hands with no ataxia or dysmetria.  Tremor was absent.  Gait and Station - deferred   ASSESSMENT/PLAN Carl Stephenson is a 71 y.o. 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 Emergent cardiac cath did not find lesions responsible for acute presentation. MRI revealed mult small R brain embolic infarcts  He did not receive IV t-PA.   Stroke:  R brain, anterior and posterior circulation infarcts, felt to be cardioembolic with recent cardiac event. Needs TEE and loop to rule out cardiac thrombus and afib  MRI  R MCA, R MCA/ACA, R PCA and R cerebellar punctate/patchy infarcts.   MRA  Moderate to severe stenosis R VA and B P2  Carotid Doppler - unremarkable  Bilateral lower extremity venous Dopplers - negative  2D Echo  EF 45-50%. No source of embolus   Recommend TEE to look for embolic source.  Consider placement of an implantable loop recorder to evaluate for atrial fibrillation as etiology of stroke if no other source found. Planned Monday.  LDL 140  HgbA1c 7.7  Heparin 5000 units sq tid for VTE prophylaxis Diet Heart Room service appropriate? Yes; Fluid consistency: Thin Diet NPO time specified  aspirin 325 mg daily and clopidogrel 75 mg daily prior to admission, now on aspirin 81 mg daily and clopidogrel 75 mg daily. Continue on discharge for stroke and cardiac prevention.  Patient counseled to be compliant  with his antithrombotic medications  Ongoing aggressive stroke risk factor management  Therapy recommendations: Home health PT recommended. OT evaluation pending.  Disposition:  pending   Seizure  GTC spell followed with postictal  Suspected prolonged postictal state  Continue Keppra  EEG no seizure but right focal slowing Seizure precautions. No driving until 6 month of seizure free. Do not participate in activities where a loss of awareness could hurt you or someone else.  No swimming alone, no tub bathing, no hot tubs, no driving, no operating motorized vehicles(cars, ATVs, motorbikes, etc), lawnmowers  or power tools.  No standing at heights, such as rooftops, ladders or stairs. No sliding boards, monkey bars or swings, or climbing trees.  Avoid hot objects such as stoves, heaters, open fires.  Wear a helmet when riding a bicycle, scooter, skateboard, etc. and avoid areas of traffic.  Set water heater to 120 degrees or less. No driving for 6 months per [place] or defer to locality where you live.  Hypertension  Cardiology restarted lisinopril after creatinine improved  Follow creatinine  Continuing low dose BB  BP goals from neuro standpoint - pts with stroke and end organ damage - our goals are 180/105   CAD s/p stent  Extensive hx of CAD   EF 45-50%  Cardiac cath showed multi vessel CAD but no acute intervention - mult PCI to RCA, CFx and RI w/ DES to LAD 2015; cath this adm w/ this adm w/ mult stents patent to LAD, ramus intermediate, LCx, prox RCA  Frequent PVCs  Suspect the seizure could be induced by arrhythmia   Cardiology on board  History of stroke  2015 MRI left frontal and right MCA/PCA punctate infarcts, old pontine, left thalamic and right CR lacunar infarcts  2 days post cardiac cath and stent placement. TEE and loop not completed.  CTA h/n unremarkable at that time  On DAPT and crestor at home  Bradycardia  Coreg 3.125 mg twice  daily  Hyperlipidemia  Home meds:  crestor 10, fenofibrate and zetia  LDL 140, goal < 70  Changed crestor to lipitor 80 by primary service  Continue statin at discharge  Diabetes  HgbA1c 7.7, goal < 7.0  Uncontrolled  Diabetes nurse following - recommends half of home Levemir dose  SSI  Close PCP following  Other Stroke Risk Factors  Advanced age  Obesity, Body mass index is 30.96 kg/m., recommend weight loss, diet and exercise as appropriate   Acute diastolic CHF   PLAN  TEE and possible loop Monday   Hospital day # 4   Personally examined patient and images, and have participated in and made any corrections needed to history, physical, neuro exam,assessment and plan as stated above.  I have personally obtained the history, evaluated lab date, reviewed imaging studies and agree with radiology interpretations.       To contact Stroke Continuity provider, please refer to [email or web address] After hours, contact General Neurology

## 2016-01-20 ENCOUNTER — Inpatient Hospital Stay (HOSPITAL_COMMUNITY): Payer: Medicare Other

## 2016-01-20 ENCOUNTER — Encounter (HOSPITAL_COMMUNITY)
Admission: EM | Disposition: A | Discharge status: Home Health | Payer: Self-pay | Source: Home / Self Care | Attending: Internal Medicine

## 2016-01-20 ENCOUNTER — Encounter (HOSPITAL_COMMUNITY): Payer: Self-pay | Admitting: *Deleted

## 2016-01-20 DIAGNOSIS — I639 Cerebral infarction, unspecified: Secondary | ICD-10-CM

## 2016-01-20 DIAGNOSIS — Q211 Atrial septal defect: Secondary | ICD-10-CM

## 2016-01-20 HISTORY — PX: EP IMPLANTABLE DEVICE: SHX172B

## 2016-01-20 HISTORY — PX: TEE WITHOUT CARDIOVERSION: SHX5443

## 2016-01-20 LAB — BASIC METABOLIC PANEL
ANION GAP: 9 (ref 5–15)
BUN: 25 mg/dL — AB (ref 6–20)
CO2: 24 mmol/L (ref 22–32)
Calcium: 8.9 mg/dL (ref 8.9–10.3)
Chloride: 103 mmol/L (ref 101–111)
Creatinine, Ser: 1.31 mg/dL — ABNORMAL HIGH (ref 0.61–1.24)
GFR calc Af Amer: >60 mL/min (ref >60)
GFR calc non Af Amer: 53 mL/min — ABNORMAL LOW (ref >60)
GLUCOSE: 183 mg/dL — AB (ref 65–99)
POTASSIUM: 4.2 mmol/L (ref 3.5–5.1)
Sodium: 136 mmol/L (ref 135–145)

## 2016-01-20 LAB — PROTIME-INR
INR: 1
Prothrombin Time: 13.2 seconds (ref 11.4–15.2)

## 2016-01-20 LAB — CULTURE, BLOOD (ROUTINE X 2)
Culture: NO GROWTH
Culture: NO GROWTH

## 2016-01-20 LAB — VAS US CAROTID
LCCADDIAS: -12 cm/s
LCCADSYS: -94 cm/s
LEFT ECA DIAS: -9 cm/s
LEFT VERTEBRAL DIAS: 14 cm/s
LICAPDIAS: -11 cm/s
Left CCA prox dias: 12 cm/s
Left CCA prox sys: 126 cm/s
Left ICA dist dias: -8 cm/s
Left ICA dist sys: -65 cm/s
Left ICA prox sys: -58 cm/s
RCCAPDIAS: 11 cm/s
RCCAPSYS: 96 cm/s
RIGHT ECA DIAS: -9 cm/s
RIGHT VERTEBRAL DIAS: 5 cm/s
Right cca dist sys: -83 cm/s

## 2016-01-20 LAB — GLUCOSE, CAPILLARY
GLUCOSE-CAPILLARY: 161 mg/dL — AB (ref 65–99)
Glucose-Capillary: 191 mg/dL — ABNORMAL HIGH (ref 65–99)
Glucose-Capillary: 251 mg/dL — ABNORMAL HIGH (ref 65–99)
Glucose-Capillary: 174 mg/dL — ABNORMAL HIGH (ref 65–99)

## 2016-01-20 SURGERY — LOOP RECORDER INSERTION
Site: Chest

## 2016-01-20 SURGERY — ECHOCARDIOGRAM, TRANSESOPHAGEAL
Anesthesia: Moderate Sedation

## 2016-01-20 MED ORDER — FENTANYL CITRATE (PF) 100 MCG/2ML IJ SOLN
INTRAMUSCULAR | Status: AC
  Filled 2016-01-20: qty 2

## 2016-01-20 MED ORDER — INSULIN DETEMIR 100 UNIT/ML ~~LOC~~ SOLN
6.0000 [IU] | Freq: Every day | SUBCUTANEOUS | Status: DC
  Administered 2016-01-21: 6 [IU] via SUBCUTANEOUS
  Filled 2016-01-20 (×2): qty 0.06

## 2016-01-20 MED ORDER — LIDOCAINE-EPINEPHRINE 1 %-1:100000 IJ SOLN
INTRAMUSCULAR | Status: AC
  Filled 2016-01-20: qty 1

## 2016-01-20 MED ORDER — MIDAZOLAM HCL 10 MG/2ML IJ SOLN
INTRAMUSCULAR | Status: DC | PRN
  Administered 2016-01-20: 2 mg via INTRAVENOUS
  Administered 2016-01-20: 1 mg via INTRAVENOUS

## 2016-01-20 MED ORDER — LIDOCAINE-EPINEPHRINE 1 %-1:100000 IJ SOLN
INTRAMUSCULAR | Status: DC | PRN
  Administered 2016-01-20: 20 mL

## 2016-01-20 MED ORDER — BUTAMBEN-TETRACAINE-BENZOCAINE 2-2-14 % EX AERO
INHALATION_SPRAY | CUTANEOUS | Status: DC | PRN
  Administered 2016-01-20: 2 via TOPICAL

## 2016-01-20 MED ORDER — MIDAZOLAM HCL 5 MG/ML IJ SOLN
INTRAMUSCULAR | Status: AC
  Filled 2016-01-20: qty 2

## 2016-01-20 MED ORDER — FENTANYL CITRATE (PF) 100 MCG/2ML IJ SOLN
INTRAMUSCULAR | Status: DC | PRN
  Administered 2016-01-20 (×2): 25 ug via INTRAVENOUS

## 2016-01-20 SURGICAL SUPPLY — 2 items
LOOP REVEAL LINQSYS (Prosthesis & Implant Heart) ×2 IMPLANT
PACK LOOP INSERTION (CUSTOM PROCEDURE TRAY) ×2 IMPLANT

## 2016-01-20 NOTE — CV Procedure (Signed)
Procedure: TEE  Indication: CVA  Sedation: Versed 3 mg IV, Fentanyl 50 mcg IV  Findings: Please see echo section for full report.  Normal LV size with EF 45%, diffuse hypokinesis.  EF somewhat difficult to measure given frequent PVCs.  Normal RV size and systolic function.  Trivial MR.  No significant TR.  Trileaflet aortic valve with no stenosis or regurgitation.  Mild left atrial enlargement, no LA appendage thrombus.  Normal right atrium.  There was a PFO noted by bubble study and color doppler. Normal caliber aorta with minimal plaque.   Impression: Possible source of embolus = PFO.   Loralie Champagne 01/20/2016 12:03 PM

## 2016-01-20 NOTE — Care Management Important Message (Signed)
Important Message  Patient Details  Name: Carl Stephenson MRN: [id number] Date of Birth: 1945/11/29   Medicare Important Message Given:  Yes    Dawayne Patricia, RN 01/20/2016, 2:13 PM

## 2016-01-20 NOTE — Progress Notes (Signed)
Patient ID: Carl Stephenson, male   DOB: 05-Feb-1945, 71 y.o.   MRN: [id number]  PROGRESS NOTE    NEEDHAM MACGOWAN  [id number] DOB: Oct 07, 1945 DOA: 01/15/2016  PCP: [name], MD   Brief Narrative:  71yo man with PMHx of CAD with prior MI and PCI, type 2 DM, prior CVA in 2015, and bladder cancer who presented to the ED on 01/15/16 after being found unresponsive at home. Per family he went to the bathroom tonight and when he got back he sat down and then suddenly moaned out and sprawled out his arms and legs stiffly with his eyes rolling to the back of his head. Family denies any whole body shaking. Family states he was unresponsive so EMS was called. He was brought by [facility] with AMS and EKG changes with probable STEMI. Patient not complaining of any chest pain or shortness of breath. Upon arrival to [facility], he was taken to the cardiac cath lab which showed 2 vessel obstructive CAD with diffuse mid-LAD disease and diffuse disease in the 1st OM, multiple stents patent, and a moderately elevated LVEDP. Family reports he has not been acting himself for the last few days. He has been "spacing out" and they will have to redirect him to regain his attention. Family also notes decreased PO intake lately. Wife reports he was supposed to see a Neurologist due to his eyes crossing and supposedly "fluid in the eyes."   SIGNIFICANT EVENTS: 1/3>> Found unresponsive at home. EKG changes with apparent STEMI and taken to cath lab  1/3>> Cath without significant changes   Assessment & Plan:   Acute encephalopathy / Acute CVA R brain, anterior and posterior circulation infarcts, felt to be cardioembolic with recent cardiac event - MRI  R MCA, R MCA/ACA, R PCA and R cerebellar punctate/patchy infarcts - MRA  Moderate to severe stenosis R VA and B P2 - Carotid Doppler showed 1-39% internal carotid artery stenosis bilaterally. Vertebral arteries are patent with antegrade flow. - 2D Echo  EF 45-50% - LE  doppler with no DVT - Plan for TEE and loop this am to rule out cardiac thrombus and afib - LDL 140 - HgbA1c 7.7 - Continue aspirin 81 mg daily and clopidogrel 75 mg daily. - HH orders for PT, RN, OT and aide in place   Seizure - Suspected prolonged postictal state - Continue Keppra  - EEG no seizure but right focal slowing  Hypertension, essential  - Cardiology restarted lisinopril after creatinine improved but cr up again at 1.37 so will hold lisinopril - He is also on lasix but we held lisinopril only to see if kidney fxn remains stable  - Cr stable at 1.31 - BP 165/58; permissive hypertension after acute CVA  CAD s/p stent - Extensive hx of CAD  - EF 45-50% - Cardiac cath showed multi vessel CAD but no acute intervention - mult PCI to RCA, CFx and RI w/ DES to LAD 2015; cath this adm w/ this adm w/ mult stents patent to LAD, ramus intermediate, LCx, prox RCA - Continue aspirin and plavix  History of stroke - 2015 MRI left frontal and right MCA/PCA punctate infarcts, old pontine, left thalamic and right CR lacunar infarcts - 2 days post cardiac cath and stent placement. TEE and loop not completed.  Dyslipidemia associated with type 2 DM - Home meds:  crestor 10, fenofibrate and zetia - LDL 140, goal < 70 - Changed crestor to lipitor 80 by primary service  Diabetes  mellitus with vascular complications with long term insulin use  - HgbA1c 7.7, goal < 7.0 - Seen by diabetic coordinator, recommended adding Levemir 6 units daily - Continue SSI    DVT prophylaxis: Heparin subQ Code Status: full code  Family Communication: Pt wife at the bedside this am Disposition Plan: needs TEE and loop recorder prior to discharge   Consultants:   Neurology   Cardiology  PT  Procedures:   [facility] 01/15/2016 - 2 vessel obstructive CAD with diffuse mid LAD disease and 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 Multiple stents patent including the proximal LAD, ramus  intermediate, LCx, and proximal RCA. Moderately elevated LVEDP  EEG  01/15/2016 - diffuse cerebral dysfunction that is non-specific in etiology and can be seen with hypoxic/ischemic injury, toxic/metabolic encephalopathies, neurodegenerative disorders, or medication effect. Additional focal slowing over the right hemisphere indicates focal cerebral dysfunction in this region suggestive of underlying structural or physiologic abnormality.The absence of epileptiform discharges does not rule out a clinical diagnosis of epilepsy. Clinical correlation is advised.  2 D ECHO 01/16/2016 - EF 45-50%, diffuse hypokinesis, moderate LVH  LE doppler bilateral 01/17/2016 - negative for DVT  Carotid doppler 01/17/2016 - Findings suggest 1-39% internal carotid artery stenosis bilaterally. Vertebral arteries are patent with antegrade flow.  Antimicrobials:   None    Subjective: No overnight events.  Objective: Vitals:   01/19/16 0548 01/19/16 1600 01/19/16 2040 01/20/16 0624  BP: (!) 165/67 132/72 (!) 164/71 (!) 165/58  Pulse: (!) 51 (!) 50 (!) 50 (!) 52  Resp: 18 18 18 18   Temp: 98 F (36.7 C) 98.1 F (36.7 C) 98.4 F (36.9 C) 98 F (36.7 C)  TempSrc: Oral Oral Oral Oral  SpO2: 95% 97% 95% 95%  Weight: 87 kg (191 lb 12.8 oz)     Height:        Intake/Output Summary (Last 24 hours) at 01/20/16 1025 Last data filed at 01/20/16 0650  Gross per 24 hour  Intake           362.33 ml  Output              950 ml  Net          -587.67 ml   Filed Weights   01/17/16 0450 01/18/16 0506 01/19/16 0548  Weight: 89.3 kg (196 lb 13.9 oz) 87.1 kg (192 lb 0.3 oz) 87 kg (191 lb 12.8 oz)    Examination:  General exam: No distress, calm And comfortable Respiratory system: No wheezing, no rhonchi Cardiovascular system: S1 & S2 heard, Rate controlled  Gastrointestinal system: Nontender abdomen, appreciate bowel sounds Central nervous system: Nonfocal Extremities: No edema, no tenderness Skin: Skin is warm  and dry Psychiatry: Normal mood and behavior  Data Reviewed: I have personally reviewed following labs and imaging studies  CBC:  Recent Labs Lab 01/15/16 0420 01/16/16 0353  WBC 8.6 8.7  NEUTROABS 6.8  --   HGB 13.2  12.9* 12.0*  HCT 39.8  38.0* 36.4*  MCV 77.7* 77.6*  PLT 172 0000000   Basic Metabolic Panel:  Recent Labs Lab 01/15/16 0420 01/15/16 1256 01/16/16 0353 01/18/16 0857 01/19/16 0258 01/20/16 0424  NA 137  138  --  138 136 138 136  K 4.9  4.8  --  3.6 3.4* 4.1 4.2  CL 106  104  --  107 105 104 103  CO2 20*  --  24 23 25 24   GLUCOSE 258*  255*  --  143*  164* 190* 183*  BUN 22*  24*  --  21* 20 23* 25*  CREATININE 1.39*  1.50*  --  1.21 1.20 1.37* 1.31*  CALCIUM 8.7*  --  8.2* 8.7* 8.8* 8.9  MG  --  2.1  --   --   --   --    GFR: Estimated Creatinine Clearance: 53.5 mL/min (by C-G formula based on SCr of 1.31 mg/dL (H)). Liver Function Tests:  Recent Labs Lab 01/15/16 0420  AST 20  ALT 13*  ALKPHOS 61  BILITOT 0.5  PROT 6.1*  ALBUMIN 3.3*   No results for input(s): LIPASE, AMYLASE in the last 168 hours.  Recent Labs Lab 01/15/16 1256  AMMONIA 38*   Coagulation Profile:  Recent Labs Lab 01/15/16 0420 01/20/16 0424  INR 0.98 1.00   Cardiac Enzymes:  Recent Labs Lab 01/15/16 0420  TROPONINI 0.08*   BNP (last 3 results) No results for input(s): PROBNP in the last 8760 hours. HbA1C: No results for input(s): HGBA1C in the last 72 hours. CBG:  Recent Labs Lab 01/19/16 0743 01/19/16 1124 01/19/16 1636 01/19/16 2038 01/20/16 0627  GLUCAP 184* 190* 191* 277* 161*   Lipid Profile: No results for input(s): CHOL, HDL, LDLCALC, TRIG, CHOLHDL, LDLDIRECT in the last 72 hours. Thyroid Function Tests: No results for input(s): TSH, T4TOTAL, FREET4, T3FREE, THYROIDAB in the last 72 hours. Anemia Panel: No results for input(s): VITAMINB12, FOLATE, FERRITIN, TIBC, IRON, RETICCTPCT in the last 72 hours. Urine analysis:      Component Value Date/Time   COLORURINE YELLOW 01/15/2016 1021   APPEARANCEUR CLEAR 01/15/2016 1021   LABSPEC 1.045 (H) 01/15/2016 1021   PHURINE 6.0 01/15/2016 1021   GLUCOSEU >=500 (A) 01/15/2016 1021   HGBUR SMALL (A) 01/15/2016 1021   BILIRUBINUR NEGATIVE 01/15/2016 1021   KETONESUR NEGATIVE 01/15/2016 1021   PROTEINUR 100 (A) 01/15/2016 1021   UROBILINOGEN 0.2 12/29/2013 0040   NITRITE NEGATIVE 01/15/2016 1021   LEUKOCYTESUR NEGATIVE 01/15/2016 1021   Sepsis Labs: @LABRCNTIP (procalcitonin:4,lacticidven:4)   MRSA PCR Screening     Status: None   Collection Time: 01/15/16  5:45 AM  Result Value Ref Range Status   MRSA by PCR NEGATIVE NEGATIVE Final  Culture, blood (routine x 2)     Status: None (Preliminary result)   Collection Time: 01/15/16 12:56 PM  Result Value Ref Range Status   Specimen Description BLOOD RIGHT HAND  Final   Special Requests IN PEDIATRIC BOTTLE  10CC  Final   Culture NO GROWTH 1 DAY  Final   Report Status PENDING  Incomplete  Culture, blood (routine x 2)     Status: None (Preliminary result)   Collection Time: 01/15/16  1:00 PM  Result Value Ref Range Status   Specimen Description BLOOD RIGHT HAND  Final   Special Requests IN PEDIATRIC BOTTLE  3CC  Final   Culture NO GROWTH 1 DAY  Final   Report Status PENDING  Incomplete      Radiology Studies: Ct Head Wo Contrast Result Date: 01/15/2016 No acute intracranial process. Chronic changes including moderate to severe chronic small vessel ischemic disease and old deep gray nuclei lacunar infarcts. Electronically Signed   By: Elon Alas M.D.   On: 01/15/2016 04:57   Mr Jodene Nam Head Wo Contrast Result Date: 01/16/2016 1. Posterior circulation atherosclerosis with moderate to severe stenosis of the distal right vertebral artery just proximal to the vertebrobasilar junction. There is mild bilateral PCA P2 segment stenosis. 2. Anterior circulation without significant stenosis. Electronically  Signed   By: Genevie Ann M.D.   On: 01/16/2016 20:54   Mr Brain Wo Contrast Result Date: 01/16/2016 1. For small nodular foci of restricted diffusion in the right brain which most resemble small acute infarcts. These are present in the right MCA, right PCA, and right cerebellar artery territories which could indicate sequelae of embolic event, although sparing of the left brain is noted. 2. No associated acute hemorrhage or mass effect. Underlying advanced chronic small vessel disease otherwise appears stable since 2015. Electronically Signed   By: Genevie Ann M.D.   On: 01/16/2016 13:38   Dg Chest Port 1 View Result Date: 01/16/2016 No acute abnormality noted. Electronically Signed   By: Inez Catalina M.D.   On: 01/16/2016 07:21      Scheduled Meds: . aspirin EC  81 mg Oral Daily  . atorvastatin  80 mg Oral q1800  . carvedilol  3.125 mg Oral BID WC  . clopidogrel  75 mg Oral Q breakfast  . fenofibrate  160 mg Oral Daily  . furosemide  40 mg Oral Daily  . heparin  5,000 Units Subcutaneous Q8H  . insulin aspart  0-15 Units Subcutaneous TID WC  . insulin aspart  0-5 Units Subcutaneous QHS  . insulin detemir  6 Units Subcutaneous Daily  . levETIRAcetam  500 mg Oral BID  . pantoprazole  40 mg Oral Daily  . potassium chloride  20 mEq Oral Daily  . sertraline  100 mg Oral Daily   Continuous Infusions: . sodium chloride 20 mL/hr at 01/20/16 0318     LOS: 5 days    Time spent: 15 minutes  Greater than 50% of the time spent on counseling and coordinating the care.   Leisa Lenz, MD [facility] Pager [phone]  If 7PM-7AM, please contact night-coverage [email or web address] Password [email or web address] 01/20/2016, 10:25 AM

## 2016-01-20 NOTE — Progress Notes (Signed)
Occupational Therapy Treatment Patient Details Name: Carl Stephenson MRN: [id number] DOB: March 12, 1945 Today's Date: 01/20/2016    History of present illness Pt is a 71 y/o male admitted after falling at home secondary to a seizure and found to have had an acute CVA (R brain, anterior and posterior circulation infarcts). PMH including but not limited to previous CVA in 2012, HTN, CAD, DM, hx of MI in 2010 and stent placement in 2015.   OT comments  Pt making good progress. Recommend 24/7 S at DC and follow up with [name] [facility] [name]. Educated pt on reducing risk of falls at home - given handout. Pt asked about driving . Recommend refraining from driving at this time until cleared by physician. Will continue to follow.   Follow Up Recommendations  Home health OT;Supervision/Assistance - 24 hour    Equipment Recommendations  3 in 1 bedside commode    Recommendations for Other Services      Precautions / Restrictions Precautions Precautions: Fall       Mobility Bed Mobility Overal bed mobility: Needs Assistance Bed Mobility: Supine to Sit     Supine to sit: Min guard        Transfers Overall transfer level: Needs assistance Equipment used: Rolling walker (2 wheeled) Transfers: Sit to/from [name] Sit to Stand: Min guard Stand pivot transfers: Min guard       General transfer comment: vc for controlled descent    Balance     Sitting balance-[name]: Fair       Standing balance-[name]: Poor                     ADL   Eating/Feeding: Set up       Upper Body Bathing: Set up;Supervision/ safety;Sitting                           Functional mobility during ADLs: Min guard;Rolling walker;Cueing for safety (vc for hand placement)   Discussed home set up for bathroom. Recommend at this time that pt sponge bath and complete shower transfer with HHOT prior ot trying to do this by himself. Pt/wife verbalized understanding.       Vision                 Additional Comments: States he was going to [name] for previous "vision issues" per [name]  Will further assess   Praxis      Cognition   Behavior During Therapy: [facility] for tasks assessed/performed Overall Cognitive Status: Impaired/Different from baseline Area of Impairment: Attention;Following commands;Awareness   Current Attention Level: Selective    Following Commands: Follows multi-step commands with increased time Safety/Judgement: Decreased awareness of safety;Decreased awareness of deficits Awareness: Emergent Problem Solving: Slow processing      Extremity/Trunk Assessment   LUE generalized weakness.             Exercises     Shoulder Instructions       General Comments      Pertinent Vitals/ Pain       Pain Assessment: Faces Faces Pain Scale: Hurts a little bit Pain Location: general soreness Pain Descriptors / Indicators: Discomfort Pain Intervention(s): Limited activity within patient's tolerance  Home Living  Prior Functioning/Environment              Frequency  Min 2X/week        Progress Toward Goals  OT Goals(current goals can now be found in the care plan section)  Progress towards OT goals: Progressing toward goals  Acute Rehab OT Goals Patient Stated Goal: home OT Goal Formulation: With patient/family Time For Goal Achievement: 02/02/16 Potential to Achieve Goals: Good ADL Goals Pt Will Perform Grooming: with modified independence;sitting Pt Will Perform Upper Body Bathing: with modified independence;sitting Pt Will Perform Lower Body Bathing: with modified independence;sit to/from stand Pt Will Perform Upper Body Dressing: with modified independence;sitting Pt Will Perform Lower Body Dressing: with modified independence;sit to/from stand Pt Will Transfer to Toilet: with modified independence;ambulating Pt Will Perform  Toileting - Clothing Manipulation and hygiene: with modified independence;sit to/from stand Pt Will Perform Tub/Shower Transfer: with supervision;ambulating;shower seat  Plan Discharge plan remains appropriate    Co-evaluation                 End of Session Equipment Utilized During Treatment: Gait belt;Rolling walker   Activity Tolerance Patient tolerated treatment well   Patient Left in chair;with call bell/phone within reach;with chair alarm set;with family/visitor present   Nurse Communication Mobility status        Time: [id number] OT Time Calculation (min): 18 min  Charges: OT General Charges $OT Visit: 1 Procedure OT Treatments $Self Care/Home Management : 8-22 mins  Carl Stephenson,Carl Stephenson 01/20/2016, 3:50 PM   [facility], OT/L  [phone] 01/20/2016

## 2016-01-20 NOTE — H&P (View-Only) (Signed)
Patient ID: Carl Stephenson, male   DOB: [birth year], 71 y.o.   MRN: [id number]  PROGRESS NOTE    LANEY TUFO  [id number] DOB: [birth year] DOA: 01/15/2016  PCP: [name], MD   Brief Narrative:  71yo man with PMHx of CAD with prior MI and PCI, type 2 DM, prior CVA in 2015, and bladder cancer who presented to the ED on 01/15/16 after being found unresponsive at home. Per family he went to the bathroom tonight and when he got back he sat down and then suddenly moaned out and sprawled out his arms and legs stiffly with his eyes rolling to the back of his head. Family denies any whole body shaking. Family states he was unresponsive so EMS was called. He was brought by [facility] with AMS and EKG changes with probable STEMI. Patient not complaining of any chest pain or shortness of breath. Upon arrival to [facility], he was taken to the cardiac cath lab which showed 2 vessel obstructive CAD with diffuse mid-LAD disease and diffuse disease in the 1st OM, multiple stents patent, and a moderately elevated LVEDP. Family reports he has not been acting himself for the last few days. He has been "spacing out" and they will have to redirect him to regain his attention. Family also notes decreased PO intake lately. Wife reports he was supposed to see a Neurologist due to his eyes crossing and supposedly "fluid in the eyes."   SIGNIFICANT EVENTS: 1/3>> Found unresponsive at home. EKG changes with apparent STEMI and taken to cath lab  1/3>> Cath without significant changes   Assessment & Plan:   Acute encephalopathy / Acute CVA R brain, anterior and posterior circulation infarcts, felt to be cardioembolic with recent cardiac event - MRI  R MCA, R MCA/ACA, R PCA and R cerebellar punctate/patchy infarcts - MRA  Moderate to severe stenosis R VA and B P2 - Carotid Doppler showed 1-39% internal carotid artery stenosis bilaterally. Vertebral arteries are patent with antegrade flow. - 2D Echo  EF 45-50% - LE  doppler with no DVT - Plan for TEE and loop this am to rule out cardiac thrombus and afib - LDL 140 - HgbA1c 7.7 - Continue aspirin 81 mg daily and clopidogrel 75 mg daily. - HH orders for PT, RN, OT and aide in place   Seizure - Suspected prolonged postictal state - Continue Keppra  - EEG no seizure but right focal slowing  Hypertension, essential  - Cardiology restarted lisinopril after creatinine improved but cr up again at 1.37 so will hold lisinopril - He is also on lasix but we held lisinopril only to see if kidney fxn remains stable  - Cr stable at 1.31 - BP 165/58; permissive hypertension after acute CVA  CAD s/p stent - Extensive hx of CAD  - EF 45-50% - Cardiac cath showed multi vessel CAD but no acute intervention - mult PCI to RCA, CFx and RI w/ DES to LAD 2015; cath this adm w/ this adm w/ mult stents patent to LAD, ramus intermediate, LCx, prox RCA - Continue aspirin and plavix  History of stroke - 2015 MRI left frontal and right MCA/PCA punctate infarcts, old pontine, left thalamic and right CR lacunar infarcts - 2 days post cardiac cath and stent placement. TEE and loop not completed.  Dyslipidemia associated with type 2 DM - Home meds:  crestor 10, fenofibrate and zetia - LDL 140, goal < 70 - Changed crestor to lipitor 80 by primary service  Diabetes  mellitus with vascular complications with long term insulin use  - HgbA1c 7.7, goal < 7.0 - Seen by diabetic coordinator, recommended adding Levemir 6 units daily - Continue SSI    DVT prophylaxis: Heparin subQ Code Status: full code  Family Communication: Pt wife at the bedside this am Disposition Plan: needs TEE and loop recorder prior to discharge   Consultants:   Neurology   Cardiology  PT  Procedures:   [facility] 01/15/2016 - 2 vessel obstructive CAD with diffuse mid LAD disease and 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 Multiple stents patent including the proximal LAD, ramus  intermediate, LCx, and proximal RCA. Moderately elevated LVEDP  EEG  01/15/2016 - diffuse cerebral dysfunction that is non-specific in etiology and can be seen with hypoxic/ischemic injury, toxic/metabolic encephalopathies, neurodegenerative disorders, or medication effect. Additional focal slowing over the right hemisphere indicates focal cerebral dysfunction in this region suggestive of underlying structural or physiologic abnormality.The absence of epileptiform discharges does not rule out a clinical diagnosis of epilepsy. Clinical correlation is advised.  2 D ECHO 01/16/2016 - EF 45-50%, diffuse hypokinesis, moderate LVH  LE doppler bilateral 01/17/2016 - negative for DVT  Carotid doppler 01/17/2016 - Findings suggest 1-39% internal carotid artery stenosis bilaterally. Vertebral arteries are patent with antegrade flow.  Antimicrobials:   None    Subjective: No overnight events.  Objective: Vitals:   01/19/16 0548 01/19/16 1600 01/19/16 2040 01/20/16 0624  BP: (!) 165/67 132/72 (!) 164/71 (!) 165/58  Pulse: (!) 51 (!) 50 (!) 50 (!) 52  Resp: 18 18 18 18   Temp: 98 F (36.7 C) 98.1 F (36.7 C) 98.4 F (36.9 C) 98 F (36.7 C)  TempSrc: Oral Oral Oral Oral  SpO2: 95% 97% 95% 95%  Weight: 87 kg (191 lb 12.8 oz)     Height:        Intake/Output Summary (Last 24 hours) at 01/20/16 1025 Last data filed at 01/20/16 0650  Gross per 24 hour  Intake           362.33 ml  Output              950 ml  Net          -587.67 ml   Filed Weights   01/17/16 0450 01/18/16 0506 01/19/16 0548  Weight: 89.3 kg (196 lb 13.9 oz) 87.1 kg (192 lb 0.3 oz) 87 kg (191 lb 12.8 oz)    Examination:  General exam: No distress, calm And comfortable Respiratory system: No wheezing, no rhonchi Cardiovascular system: S1 & S2 heard, Rate controlled  Gastrointestinal system: Nontender abdomen, appreciate bowel sounds Central nervous system: Nonfocal Extremities: No edema, no tenderness Skin: Skin is warm  and dry Psychiatry: Normal mood and behavior  Data Reviewed: I have personally reviewed following labs and imaging studies  CBC:  Recent Labs Lab 01/15/16 0420 01/16/16 0353  WBC 8.6 8.7  NEUTROABS 6.8  --   HGB 13.2  12.9* 12.0*  HCT 39.8  38.0* 36.4*  MCV 77.7* 77.6*  PLT 172 0000000   Basic Metabolic Panel:  Recent Labs Lab 01/15/16 0420 01/15/16 1256 01/16/16 0353 01/18/16 0857 01/19/16 0258 01/20/16 0424  NA 137  138  --  138 136 138 136  K 4.9  4.8  --  3.6 3.4* 4.1 4.2  CL 106  104  --  107 105 104 103  CO2 20*  --  24 23 25 24   GLUCOSE 258*  255*  --  143*  164* 190* 183*  BUN 22*  24*  --  21* 20 23* 25*  CREATININE 1.39*  1.50*  --  1.21 1.20 1.37* 1.31*  CALCIUM 8.7*  --  8.2* 8.7* 8.8* 8.9  MG  --  2.1  --   --   --   --    GFR: Estimated Creatinine Clearance: 53.5 mL/min (by C-G formula based on SCr of 1.31 mg/dL (H)). Liver Function Tests:  Recent Labs Lab 01/15/16 0420  AST 20  ALT 13*  ALKPHOS 61  BILITOT 0.5  PROT 6.1*  ALBUMIN 3.3*   No results for input(s): LIPASE, AMYLASE in the last 168 hours.  Recent Labs Lab 01/15/16 1256  AMMONIA 38*   Coagulation Profile:  Recent Labs Lab 01/15/16 0420 01/20/16 0424  INR 0.98 1.00   Cardiac Enzymes:  Recent Labs Lab 01/15/16 0420  TROPONINI 0.08*   BNP (last 3 results) No results for input(s): PROBNP in the last 8760 hours. HbA1C: No results for input(s): HGBA1C in the last 72 hours. CBG:  Recent Labs Lab 01/19/16 0743 01/19/16 1124 01/19/16 1636 01/19/16 2038 01/20/16 0627  GLUCAP 184* 190* 191* 277* 161*   Lipid Profile: No results for input(s): CHOL, HDL, LDLCALC, TRIG, CHOLHDL, LDLDIRECT in the last 72 hours. Thyroid Function Tests: No results for input(s): TSH, T4TOTAL, FREET4, T3FREE, THYROIDAB in the last 72 hours. Anemia Panel: No results for input(s): VITAMINB12, FOLATE, FERRITIN, TIBC, IRON, RETICCTPCT in the last 72 hours. Urine analysis:      Component Value Date/Time   COLORURINE YELLOW 01/15/2016 1021   APPEARANCEUR CLEAR 01/15/2016 1021   LABSPEC 1.045 (H) 01/15/2016 1021   PHURINE 6.0 01/15/2016 1021   GLUCOSEU >=500 (A) 01/15/2016 1021   HGBUR SMALL (A) 01/15/2016 1021   BILIRUBINUR NEGATIVE 01/15/2016 1021   KETONESUR NEGATIVE 01/15/2016 1021   PROTEINUR 100 (A) 01/15/2016 1021   UROBILINOGEN 0.2 12/29/2013 0040   NITRITE NEGATIVE 01/15/2016 1021   LEUKOCYTESUR NEGATIVE 01/15/2016 1021   Sepsis Labs: @LABRCNTIP (procalcitonin:4,lacticidven:4)   MRSA PCR Screening     Status: None   Collection Time: 01/15/16  5:45 AM  Result Value Ref Range Status   MRSA by PCR NEGATIVE NEGATIVE Final  Culture, blood (routine x 2)     Status: None (Preliminary result)   Collection Time: 01/15/16 12:56 PM  Result Value Ref Range Status   Specimen Description BLOOD RIGHT HAND  Final   Special Requests IN PEDIATRIC BOTTLE  10CC  Final   Culture NO GROWTH 1 DAY  Final   Report Status PENDING  Incomplete  Culture, blood (routine x 2)     Status: None (Preliminary result)   Collection Time: 01/15/16  1:00 PM  Result Value Ref Range Status   Specimen Description BLOOD RIGHT HAND  Final   Special Requests IN PEDIATRIC BOTTLE  3CC  Final   Culture NO GROWTH 1 DAY  Final   Report Status PENDING  Incomplete      Radiology Studies: Ct Head Wo Contrast Result Date: 01/15/2016 No acute intracranial process. Chronic changes including moderate to severe chronic small vessel ischemic disease and old deep gray nuclei lacunar infarcts. Electronically Signed   By: Elon Alas M.D.   On: 01/15/2016 04:57   Mr Jodene Nam Head Wo Contrast Result Date: 01/16/2016 1. Posterior circulation atherosclerosis with moderate to severe stenosis of the distal right vertebral artery just proximal to the vertebrobasilar junction. There is mild bilateral PCA P2 segment stenosis. 2. Anterior circulation without significant stenosis. Electronically  Signed   By: Genevie Ann M.D.   On: 01/16/2016 20:54   Mr Brain Wo Contrast Result Date: 01/16/2016 1. For small nodular foci of restricted diffusion in the right brain which most resemble small acute infarcts. These are present in the right MCA, right PCA, and right cerebellar artery territories which could indicate sequelae of embolic event, although sparing of the left brain is noted. 2. No associated acute hemorrhage or mass effect. Underlying advanced chronic small vessel disease otherwise appears stable since 2015. Electronically Signed   By: Genevie Ann M.D.   On: 01/16/2016 13:38   Dg Chest Port 1 View Result Date: 01/16/2016 No acute abnormality noted. Electronically Signed   By: Inez Catalina M.D.   On: 01/16/2016 07:21      Scheduled Meds: . aspirin EC  81 mg Oral Daily  . atorvastatin  80 mg Oral q1800  . carvedilol  3.125 mg Oral BID WC  . clopidogrel  75 mg Oral Q breakfast  . fenofibrate  160 mg Oral Daily  . furosemide  40 mg Oral Daily  . heparin  5,000 Units Subcutaneous Q8H  . insulin aspart  0-15 Units Subcutaneous TID WC  . insulin aspart  0-5 Units Subcutaneous QHS  . insulin detemir  6 Units Subcutaneous Daily  . levETIRAcetam  500 mg Oral BID  . pantoprazole  40 mg Oral Daily  . potassium chloride  20 mEq Oral Daily  . sertraline  100 mg Oral Daily   Continuous Infusions: . sodium chloride 20 mL/hr at 01/20/16 0318     LOS: 5 days    Time spent: 15 minutes  Greater than 50% of the time spent on counseling and coordinating the care.   Leisa Lenz, MD [facility] Pager [phone]  If 7PM-7AM, please contact night-coverage [email or web address] Password [email or web address] 01/20/2016, 10:25 AM

## 2016-01-20 NOTE — Progress Notes (Signed)
  Echocardiogram Echocardiogram Transesophageal has been performed.  Carl Stephenson 01/20/2016, 12:11 PM

## 2016-01-20 NOTE — Progress Notes (Addendum)
Inpatient Diabetes Program Recommendations  AACE/ADA: New Consensus Statement on Inpatient Glycemic Control (2015)  Target Ranges:  Prepandial:   less than 140 mg/dL      Peak postprandial:   less than 180 mg/dL (1-2 hours)      Critically ill patients:  140 - 180 mg/dL   Lab Results  Component Value Date   GLUCAP 161 (H) 01/20/2016   HGBA1C 7.7 (H) 01/16/2016   Results for HOBY, FOLLEN (MRN [id number]) as of 01/20/2016 08:37  Ref. Range 01/19/2016 07:43 01/19/2016 11:24 01/19/2016 16:36 01/19/2016 20:38 01/20/2016 06:27  Glucose-Capillary Latest Ref Range: 65 - 99 mg/dL 184 (H) 190 (H) 191 (H) 277 (H) 161 (H)   Review of Glycemic Control  Diabetes history: DM, BUN=25, Creatinine=1.31 Outpatient Diabetes medications: Novolog 10 units before supper, Levemir 65 units with supper, Metformin 850 mg TID Current orders for Inpatient glycemic control: Novolog moderate correction 0-15 units TIDAC and 0-5 units QHS, NPO for TEE today  Inpatient Diabetes Program Recommendations:     Patient has had 12 units of Novolog in past 24 hours.  No low CBG's noted.     Please consider Levemir 6 units with supper daily after procedure scheduled for today.  Thank you,  Windy Carina, RN, MSN Diabetes Coordinator Inpatient Diabetes Program [phone] (Team Pager)

## 2016-01-20 NOTE — Progress Notes (Signed)
STROKE TEAM PROGRESS NOTE   SUBJECTIVE (INTERVAL HISTORY) Patient in endo. Awaiting TEE and loop placement if TEE negative. Family at bedside.    OBJECTIV E Temp:  [98 F (36.7 C)-98.7 F (37.1 C)] 98.7 F (37.1 C) (01/08 1049) Pulse Rate:  [50-52] 51 (01/08 1049) Cardiac Rhythm: Sinus bradycardia (01/08 0829) Resp:  [14-18] 14 (01/08 1049) BP: (132-165)/(58-90) 141/90 (01/08 1049) SpO2:  [93 %-97 %] 93 % (01/08 1049)  CBC:   Recent Labs Lab 01/15/16 0420 01/16/16 0353  WBC 8.6 8.7  NEUTROABS 6.8  --   HGB 13.2  12.9* 12.0*  HCT 39.8  38.0* 36.4*  MCV 77.7* 77.6*  PLT 172 0000000    Basic Metabolic Panel:   Recent Labs Lab 01/15/16 1256  01/19/16 0258 01/20/16 0424  NA  --   < > 138 136  K  --   < > 4.1 4.2  CL  --   < > 104 103  CO2  --   < > 25 24  GLUCOSE  --   < > 190* 183*  BUN  --   < > 23* 25*  CREATININE  --   < > 1.37* 1.31*  CALCIUM  --   < > 8.8* 8.9  MG 2.1  --   --   --   < > = values in this interval not displayed.  Lipid Panel:     Component Value Date/Time   CHOL 215 (H) 01/17/2016 0416   TRIG 238 (H) 01/17/2016 0416   HDL 27 (L) 01/17/2016 0416   CHOLHDL 8.0 01/17/2016 0416   VLDL 48 (H) 01/17/2016 0416   LDLCALC 140 (H) 01/17/2016 0416   HgbA1c:  Lab Results  Component Value Date   HGBA1C 7.7 (H) 01/16/2016   Urine Drug Screen:     Component Value Date/Time   LABOPIA NONE DETECTED 01/15/2016 1021   COCAINSCRNUR NONE DETECTED 01/15/2016 1021   LABBENZ NONE DETECTED 01/15/2016 1021   AMPHETMU NONE DETECTED 01/15/2016 1021   THCU NONE DETECTED 01/15/2016 1021   LABBARB NONE DETECTED 01/15/2016 1021      IMAGING  TEE pending     PHYSICAL EXAM General - Well nourished, well developed, in no apparent distress.  Ophthalmologic - Fundi not visualized due to eye movement.  Cardiovascular - Regular rate and rhythm.  Mental Status -  Level of arousal and orientation to time, place, and person were intact. Language  including expression, naming, repetition, comprehension was assessed and found intact. Fund of Knowledge was assessed and was  diminished.  Cranial Nerves II - XII - II - Visual field intact OU. III, IV, VI - Extraocular movements intact. V - Facial sensation intact bilaterally. VII - Facial movement intact bilaterally. VIII - Hearing & vestibular intact bilaterally. X - Palate elevates symmetrically. XI - Chin turning & shoulder shrug intact bilaterally. XII - Tongue protrusion intact.  Motor Strength - The patient's strength was equal and intact in all extremities and pronator drift was absent.  Bulk was normal and fasciculations were absent.   Motor Tone - Muscle tone was assessed at the neck and appendages and was normal.  Reflexes - The patient's reflexes were 1+ in all extremities and he had no pathological reflexes.  Sensory - Light touch, temperature/pinprick were assessed and were symmetrical.    Coordination - The patient had normal movements in the hands with no ataxia or dysmetria.  Tremor was absent.  Gait and Station - deferred   ASSESSMENT/PLAN Mr.  Carl Stephenson is a 71 y.o. 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 Emergent cardiac cath did not find lesions responsible for acute presentation. MRI revealed mult small R brain embolic infarcts  He did not receive IV t-PA.   Stroke:  R brain, anterior and posterior circulation infarcts, felt to be cardioembolic with recent cardiac event.   MRI  R MCA, R MCA/ACA, R PCA and R cerebellar punctate/patchy infarcts.   MRA  Moderate to severe stenosis R VA and B P2  Carotid Doppler - unremarkable  Bilateral lower extremity venous Dopplers - negative  2D Echo  EF 45-50%. No source of embolus   TEE to look for embolic  source.  Consider placement of an implantable loop recorder to evaluate for atrial fibrillation as etiology of stroke if no other source found. Planned Monday.  LDL 140  HgbA1c 7.7  Heparin 5000 units sq tid for VTE prophylaxis Diet NPO time specified Diet NPO time specified  aspirin 325 mg daily and clopidogrel 75 mg daily prior to admission, now on aspirin 81 mg daily and clopidogrel 75 mg daily. Continue on discharge for stroke and cardiac prevention.  Patient counseled to be compliant with his antithrombotic medications  Ongoing aggressive stroke risk factor management  Therapy recommendations: Home health PT recommended. OT evaluation pending.  Disposition:  pending   Seizure  GTC spell followed with postictal  Suspected prolonged postictal state  Continue Keppra  EEG no seizure but right focal slowing Seizure precautions. No driving until 6 month of seizure free. Do not participate in activities where a loss of awareness could hurt you or someone else.  No swimming alone, no tub bathing, no hot tubs, no driving, no operating motorized vehicles(cars, ATVs, motorbikes, etc), lawnmowers or power tools.  No standing at heights, such as rooftops, ladders or stairs. No sliding boards, monkey bars or swings, or climbing trees.  Avoid hot objects such as stoves, heaters, open fires.  Wear a helmet when riding a bicycle, scooter, skateboard, etc. and avoid areas of traffic.  Set water heater to 120 degrees or less. No driving for 6 months per [place] or defer to locality where you live.  Hypertension  Cardiology restarted lisinopril after creatinine improved  Follow creatinine  Continuing low dose BB  BP goals from neuro standpoint - pts with stroke and end organ damage - our goals are 180/105   CAD s/p stent  Extensive hx of CAD   EF 45-50%  Cardiac cath showed multi vessel CAD but no acute intervention - mult PCI to RCA, CFx and RI w/ DES to LAD 2015; cath this adm w/ this  adm w/ mult stents patent to LAD, ramus intermediate, LCx, prox RCA  Frequent PVCs  Suspect the seizure could be induced by arrhythmia   Cardiology on board  History of stroke  2015 MRI left frontal and right MCA/PCA punctate infarcts, old pontine, left thalamic and right CR lacunar infarcts  2 days post cardiac cath and stent placement. TEE and loop not completed.  CTA h/n unremarkable at that time  On DAPT and crestor at home  Bradycardia  Coreg 3.125 mg twice daily  Hyperlipidemia  Home meds:  crestor 10, fenofibrate and zetia  LDL 140, goal < 70  Changed crestor to lipitor 80 by primary service  Continue statin at discharge  Diabetes  HgbA1c 7.7, goal < 7.0  Uncontrolled  Diabetes nurse following - recommends half of home Levemir dose  SSI  Close PCP following  Other Stroke Risk Factors  Advanced age  Obesity, Body mass index is 30.96 kg/m., recommend weight loss, diet and exercise as appropriate   Acute diastolic CHF   PLAN  TEE and possible loop Monday   Hospital day # 5   Personally examined patient and images, and have participated in and made any corrections needed to history, physical, neuro exam,assessment and plan as stated above.  I have personally obtained the history, evaluated lab date, reviewed imaging studies and agree with radiology interpretations. Long d/w wife at bedside and answered questions. Follow TEE results.Greater than 50% time during this 25 minute visit was spent in coordination of care and counseling about stroke risk, prevention and treatment  Antony Contras, MD  [facility] [address] [place], [place] [address]  Phone [phone] Fax [phone]    To contact Stroke Continuity provider, please refer to [email or web address] After hours, contact General Neurology

## 2016-01-20 NOTE — Progress Notes (Signed)
Patient noted to have a 5 beat run of v-tach. MD aware. Ok to proceed with procedure.

## 2016-01-20 NOTE — Progress Notes (Signed)
OT Cancellation    01/20/16 1100  OT Visit Information  Last OT Received On 01/20/16  Reason Eval/Treat Not Completed Patient at procedure or test/ unavailable (endoscopy. Will attempt to see later if able. )  [facility], OT/L  [phone] 01/20/2016

## 2016-01-20 NOTE — Interval H&P Note (Signed)
History and Physical Interval Note:  01/20/2016 11:42 AM  Carl Stephenson  has presented today for surgery, with the diagnosis of stroke  The various methods of treatment have been discussed with the patient and family. After consideration of risks, benefits and other options for treatment, the patient has consented to  Procedure(s): TRANSESOPHAGEAL ECHOCARDIOGRAM (TEE) (N/A) as a surgical intervention .  The patient's history has been reviewed, patient examined, no change in status, stable for surgery.  I have reviewed the patient's chart and labs.  Questions were answered to the patient's satisfaction.     [place] [employer]

## 2016-01-20 NOTE — Progress Notes (Signed)
Physical Therapy Treatment Patient Details Name: Carl Stephenson MRN: [id number] DOB: [birth year] Today's Date: 01/20/2016    History of Present Illness Pt is a 71 y/o male admitted after falling at home secondary to a seizure and found to have had an acute CVA (R brain, anterior and posterior circulation infarcts). PMH including but not limited to previous CVA in 2012, HTN, CAD, DM, hx of MI in 2010 and stent placement in 2015.    PT Comments    Pt with improved ambulation tolerance but remains to have impaired balance, L sided weakness, and impaired co-ordination. Pt able to walk without AD however demo's increased stability with RW. Recommend RW for safe ambulation until pt can achieve >19 on DGI test. Acute PT to con't to follow.   Follow Up Recommendations  Home health PT;Supervision/Assistance - 24 hour     Equipment Recommendations  Rolling walker with 5" wheels    Recommendations for Other Services       Precautions / Restrictions Precautions Precautions: Fall Restrictions Weight Bearing Restrictions: No    Mobility  Bed Mobility Overal bed mobility: Needs Assistance Bed Mobility: Supine to Sit     Supine to sit: Min assist     General bed mobility comments: hob flat, increased time, use of bed rail  Transfers Overall transfer level: Needs assistance Equipment used: None Transfers: Sit to/from Stand Sit to Stand: Min assist         General transfer comment: v/c's for safet hand placement, increased time, wide base of support, guarded, minA to steady pt due to no device  Ambulation/Gait Ambulation/Gait assistance: Min guard;Min assist Ambulation Distance (Feet): 150 Feet Assistive device: Rolling walker (2 wheeled);None Gait Pattern/deviations: Step-through pattern;Decreased step length - right;Decreased stance time - left;Decreased stride length Gait velocity: slow Gait velocity interpretation: Below normal speed for age/gender General Gait Details:  decreased step height. initially amb without AD and pt required minA to maintain balance, pt with extremely short steps with decreased weight shift to the L. pt given RW to work on improved gait pattern. Pt min guard with RW with increaed stability. Pt able to increase step length and fluidity of gait pattern however unable to maintain >10 feet without verbal cues   Stairs            Wheelchair Mobility    Modified Rankin (Stroke Patients Only) Modified Rankin (Stroke Patients Only) Pre-Morbid Rankin Score: No significant disability Modified Rankin: Moderate disability     Balance Overall balance assessment: Needs assistance Sitting-balance support: Feet supported;No upper extremity supported Sitting balance-[name]: Fair Sitting balance - Comments: trunk flexion noted sitting EOB   Standing balance support: During functional activity;No upper extremity supported Standing balance-[name]: Poor Standing balance comment: pt unsteady, benefits from unilateral UE support to minimize falls risk                    Cognition Arousal/Alertness: Awake/alert Behavior During Therapy: WFL for tasks assessed/performed Overall Cognitive Status: Impaired/Different from baseline Area of Impairment: Attention;Following commands;Awareness   Current Attention Level: Selective   Following Commands: Follows multi-step commands with increased time Safety/Judgement: Decreased awareness of safety;Decreased awareness of deficits Awareness: Emergent Problem Solving: Slow processing;Decreased initiation;Difficulty sequencing;Requires verbal cues;Requires tactile cues General Comments: max directional v/c's to complete exercises    Exercises General Exercises - Lower Extremity Hip Flexion/Marching: AROM;10 reps;Right;Standing (completed to promote inc L WBing, help R LE up for 3 sec) Heel Raises: AROM;Both;20 reps;Standing (PT to assist with inc weight  shift to the L) Mini-Sqauts:  AROM;Both;10 reps;Standing (PT to assist with inc weight shift to left)    General Comments        Pertinent Vitals/Pain Pain Assessment: No/denies pain    Home Living                      Prior Function            PT Goals (current goals can now be found in the care plan section) Acute Rehab PT Goals Patient Stated Goal: home Progress towards PT goals: Progressing toward goals    Frequency    Min 4X/week      PT Plan Current plan remains appropriate    Co-evaluation             End of Session Equipment Utilized During Treatment: Gait belt Activity Tolerance: Patient tolerated treatment well Patient left: in chair;with call bell/phone within reach;with family/visitor present;with nursing/sitter in room     Time: 0802-0829 PT Time Calculation (min) (ACUTE ONLY): 27 min  Charges:  $Gait Training: 8-22 mins $Therapeutic Exercise: 8-22 mins                    G Codes:      Sophiea Ueda M Arlana Canizales 2016-01-31, 9:59 AM   Kittie Plater, PT, DPT Pager #: [phone] Office #: [phone]

## 2016-01-20 NOTE — Care Management Note (Addendum)
Case Management Note Marvetta Gibbons RN, BSN Unit 2W-Case Manager [phone]  Patient Details  Name: Carl Stephenson MRN: [id number] Date of Birth: November 13, 1945  Subjective/Objective:  Pt admitted with acute encephalopathy , ?Sz                   Action/Plan: PTA pt lived at home with family, plan to return home- orders placed for HHRN/PT/OT/aide, pt also reports that he needs RW, spoke with pt at bedside to offer choice for [facility] agency in [facility]- per pt he would like to use [facility] for services- referral called to [facility] for HHRN/PT/OT/aide- call also made to [facility] with [facility] for DME need- RW to be delivered to room prior to discharge.    Expected Discharge Date:    01/21/16              Expected Discharge Plan:  [place]  In-House Referral:     Discharge planning Services  CM Consult  Post Acute Care Choice:  Durable Medical Equipment, Home Health Choice offered to:  Patient  DME Arranged:  Walker rolling DME Agency:  [place] Arranged:  RN, OT, PT, Nurse's Aide [place] Agency:  [facility]  Status of Service:  Completed, signed off  If discussed at [place] of Stay Meetings, dates discussed:    Discharge Disposition:  Home with home health  Additional Comments:  01/20/16- 1600- Marvetta Gibbons RN, CM- received call from Santiago Glad with [facility]- they were unable to accept referral due to pt's address/zip code- have cross referred it to [facility] who have accepted the referral.   Dawayne Patricia, RN 01/20/2016, 2:53 PM

## 2016-01-20 NOTE — Consult Note (Signed)
ELECTROPHYSIOLOGY CONSULT NOTE  Patient ID: Carl Stephenson MRN: [id number], DOB/AGE: [birth year]   Admit date: 01/15/2016 Date of Consult: 01/20/2016  Primary Physician: [name], MD Primary Cardiologist: Burt Knack Reason for Consultation: Cryptogenic stroke; recommendations regarding Implantable Loop Recorder  History of Present Illness [name] was admitted on 01/15/2016 with unresponsivness.  They first developed symptoms while sitting on the couch on the day of admission.  Imaging demonstrated right brain anterior and posterior circulation infarcts felt to be embolic 2/2 unknown source.  He is also felt to have had a seizure. 71  On arrival to the ER, he had ST elevation and underwent cardiac catheterization with patent stents.  He has undergone workup for stroke including echocardiogram and carotid dopplers.  The patient has been monitored on telemetry which has demonstrated sinus rhythm with no arrhythmias.  Inpatient stroke work-up is to be completed with a TEE.   Echocardiogram this admission demonstrated EF 45-50%, moderate LVH, diffuse hypokinesis, LA 40.  Lab work is reviewed.  Prior to admission, the patient denies chest pain, shortness of breath, dizziness, palpitations, or syncope.  They are recovering from their stroke with plans to go to SNF at discharge.  EP has been asked to evaluate for placement of an implantable loop recorder to monitor for atrial fibrillation.  Past Medical History:  Diagnosis Date  . Acute encephalopathy admitted 12/28/2013  . Bladder tumor    Recurrent  . Bradycardia 01/16/2016  . Cancer [facility])    "had TURBT; cancer free since ~ 11/2013/MD" (12/27/2013)  . Chronic lower back pain   . Coronary artery disease    Cath 12/27/2013 EF 35-40%, 80-90% prox LAD treated with balloon angioplasty and 3.0x28 mm Xience DES, LCx with anomalous origin, 50-60% stenosis in OM, 50% stenosis in PDA and PLA  . Diabetes mellitus type II   . GERD  (gastroesophageal reflux disease)   . History of hiatal hernia   . Hyperlipidemia   . Hypertension   . 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 Ischemic chest pain ([place]) 12/27/2013  . Kidney stones    "passed them all" (12/27/2013)  . Obesity   . Stroke [name]) ~ 2012   "left him weaker on LLE"  . TIA (transient ischemic attack) "several"  . Tobacco user    Quit 1994  . Wide-complex tachycardia [facility])      Surgical History:  Past Surgical History:  Procedure Laterality Date  . CARDIAC CATHETERIZATION     "he's had a couple before today where they just looked around" (12/27/2013)  . CARDIAC CATHETERIZATION N/A 01/15/2016   Procedure: Left Heart Cath and Coronary Angiography;  Surgeon: Peter M Martinique, MD;  Location: [place] [facility];  Service: Cardiovascular;  Laterality: N/A;  . CORONARY ANGIOPLASTY WITH STENT PLACEMENT     "he's had 3-4 stents put in before today"   . CORONARY ANGIOPLASTY WITH STENT PLACEMENT  12/27/2013   "1"  . LEFT HEART CATHETERIZATION WITH CORONARY ANGIOGRAM N/A 10/02/2011   Procedure: LEFT HEART CATHETERIZATION WITH CORONARY ANGIOGRAM;  Surgeon: [name] Bow, MD;  Location: [facility] CATH LAB;  Service: Cardiovascular;  Laterality: N/A;  . LEFT HEART CATHETERIZATION WITH CORONARY ANGIOGRAM N/A 12/27/2013   Procedure: LEFT HEART CATHETERIZATION WITH CORONARY ANGIOGRAM;  Surgeon: Blane Ohara, MD;  Location: [facility] CATH LAB;  Service: Cardiovascular;  Laterality: N/A;  . TRANSURETHRAL RESECTION OF BLADDER TUMOR WITH GYRUS (TURBT-GYRUS)  "several times"     Prescriptions Prior to  Admission  Medication Sig Dispense Refill Last Dose  . aspirin EC 325 MG tablet Take 1 tablet (325 mg total) by mouth daily. 30 tablet 0 Past Week at Unknown time  . carvedilol (COREG) 6.25 MG tablet Take 1 tablet (6.25 mg total) by mouth 2 (two) times daily with a meal. 60 tablet 1 Past Week at Unknown time  . clopidogrel (PLAVIX) 75 MG tablet Take  75 mg by mouth daily.   01/14/2016 at Unknown time  . fenofibrate 160 MG tablet Take 160 mg by mouth daily.   1 01/14/2016 at Unknown time  . fexofenadine (ALLEGRA) 180 MG tablet Take 180 mg by mouth daily.   Past Week at Unknown time  . insulin aspart (NOVOLOG FLEXPEN) 100 UNIT/ML FlexPen Inject 10 Units into the skin daily before supper.   Past Week at Unknown time  . insulin detemir (LEVEMIR) 100 UNIT/ML injection Inject 0.4 mLs (40 Units total) into the skin daily with supper. (Patient taking differently: Inject 65 Units into the skin daily with supper. ) 10 mL 11 Past Week at Unknown time  . lisinopril (PRINIVIL,ZESTRIL) 10 MG tablet TAKE 1 TABLET BY MOUTH DAILY 90 tablet 3 Past Week at Unknown time  . pantoprazole (PROTONIX) 40 MG tablet Take 40 mg by mouth daily.     Past Week at Unknown time  . sertraline (ZOLOFT) 100 MG tablet Take 100 mg by mouth daily.   Past Week at Unknown time  . triamcinolone (KENALOG) 0.1 % paste Use as directed 1 application in the mouth or throat 2 (two) times daily as needed (wound care (mouth)).   Unknown at Unknown  . nitroGLYCERIN (NITROSTAT) 0.4 MG SL tablet Place 1 tablet (0.4 mg total) under the tongue every 5 (five) minutes as needed. May repeat for up to 3 doses. (Patient taking differently: Place 0.4 mg under the tongue every 5 (five) minutes as needed for chest pain. May repeat for up to 3 doses.) 25 tablet 3 Unknown at Unknown  . rosuvastatin (CRESTOR) 10 MG tablet Take 1 tablet (10 mg total) by mouth daily at 6 PM. (Patient not taking: Reported on 01/15/2016) 30 tablet 5 Not Taking at Unknown time    Inpatient Medications:  . aspirin EC  81 mg Oral Daily  . atorvastatin  80 mg Oral q1800  . carvedilol  3.125 mg Oral BID WC  . clopidogrel  75 mg Oral Q breakfast  . fenofibrate  160 mg Oral Daily  . furosemide  40 mg Oral Daily  . heparin  5,000 Units Subcutaneous Q8H  . insulin aspart  0-15 Units Subcutaneous TID WC  . insulin aspart  0-5 Units  Subcutaneous QHS  . levETIRAcetam  500 mg Oral BID  . pantoprazole  40 mg Oral Daily  . potassium chloride  20 mEq Oral Daily  . sertraline  100 mg Oral Daily    Allergies:  Allergies  Allergen Reactions  . Nitrofurantoin Monohyd Macro Hives and Rash    Social History   Social History  . Marital status: Married    Spouse name: N/A  . Number of children: N/A  . Years of education: N/A   Occupational History  . 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 Smoking status: Former Smoker    Packs/day: 3.00    Years: 22.00    Types: Cigarettes    Quit date: 08/12/1992  . Smokeless tobacco: Never Used  . Alcohol use No  .  Drug use: No  . Sexual activity: Not on file   Other Topics Concern  . Not on file   Social History Narrative   Married    2 grown children     Family History  Problem Relation Age of Onset  . Coronary artery disease Other       Review of Systems: All other systems reviewed and are otherwise negative except as noted above.  Physical Exam: Vitals:   01/19/16 0548 01/19/16 1600 01/19/16 2040 01/20/16 0624  BP: (!) 165/67 132/72 (!) 164/71 (!) 165/58  Pulse: (!) 51 (!) 50 (!) 50 (!) 52  Resp: 18 18 18 18   Temp: 98 F (36.7 C) 98.1 F (36.7 C) 98.4 F (36.9 C) 98 F (36.7 C)  TempSrc: Oral Oral Oral Oral  SpO2: 95% 97% 95% 95%  Weight: 191 lb 12.8 oz (87 kg)     Height:        GEN- The patient is elderly appearing, alert and oriented x 3 today.   Head- normocephalic, atraumatic Eyes-  Sclera clear, conjunctiva pink Ears- hearing intact Oropharynx- clear Neck- supple Lungs- Clear to ausculation bilaterally, normal work of breathing Heart- Regular rate and rhythm  GI- soft, NT, ND, + BS Extremities- no clubbing, cyanosis, or edema MS- no significant deformity or atrophy Skin- no rash or lesion Psych- euthymic mood, full affect   Labs:   Lab Results  Component Value Date   WBC 8.7  01/16/2016   HGB 12.0 (L) 01/16/2016   HCT 36.4 (L) 01/16/2016   MCV 77.6 (L) 01/16/2016   PLT 160 01/16/2016    Recent Labs Lab 01/15/16 0420  01/20/16 0424  NA 137  138  < > 136  K 4.9  4.8  < > 4.2  CL 106  104  < > 103  CO2 20*  < > 24  BUN 22*  24*  < > 25*  CREATININE 1.39*  1.50*  < > 1.31*  CALCIUM 8.7*  < > 8.9  PROT 6.1*  --   --   BILITOT 0.5  --   --   ALKPHOS 61  --   --   ALT 13*  --   --   AST 20  --   --   GLUCOSE 258*  255*  < > 183*  < > = values in this interval not displayed.   Radiology/Studies: Ct Head Wo Contrast Result Date: 01/15/2016 CLINICAL DATA:  Code STEMI. Altered mental status. Assess for hemorrhage. History of hypertension, hyperlipidemia, cancer, stroke. EXAM: CT HEAD WITHOUT CONTRAST TECHNIQUE: Contiguous axial images were obtained from the base of the skull through the vertex without intravenous contrast. COMPARISON:  CT HEAD July 17, 2015 and MRI head December 29, 2013 FINDINGS: BRAIN: The ventricles and sulci are normal for age. No intraparenchymal hemorrhage, mass effect nor midline shift. Patchy to confluent supratentorial and pontine white matter hypodensities. Chronic appearing bilateral thalamus and basal ganglia lacunar infarcts. No acute large vascular territory infarcts. No abnormal extra-axial fluid collections. Basal cisterns are patent. VASCULAR: Moderate calcific atherosclerosis of the carotid siphons. SKULL: No skull fracture. No significant scalp soft tissue swelling. SINUSES/ORBITS: The mastoid air-cells and included paranasal sinuses are well-aerated. Status post LEFT ocular lens implant. The included ocular globes and orbital contents are non-suspicious. OTHER: None. IMPRESSION: No acute intracranial process. Chronic changes including moderate to severe chronic small vessel ischemic disease and old deep gray nuclei lacunar infarcts. Electronically Signed   By: Thana Farr.D.  On: 01/15/2016 04:57   Mr Virgel Paling [id number] Contrast  Result Date: 01/16/2016 CLINICAL DATA:  71 y/o male with encephalopathy. Four small foci of restricted diffusion in the right brain on brain MRI earlier today. Initial encounter. EXAM: MRA HEAD WITHOUT CONTRAST TECHNIQUE: Angiographic images of the Circle of Willis were obtained using MRA technique without intravenous contrast. COMPARISON:  Brain MRI 1241 hours today. FINDINGS: Antegrade flow in the posterior circulation. The distal left vertebral artery is dominant. There is moderate to severe stenosis of the distal right vertebral artery just proximal to the vertebrobasilar junction. The more proximal right vertebral appears normal. Both PICA origins appear normal. Patent basilar artery without irregularity or stenosis. Shared origins of the bilateral PCAs and ICAs appear within normal limits. Posterior communicating arteries are diminutive or absent. There is mild irregularity and stenosis in the bilateral P2 PCA segments (series 305, image 2 on the right and image 17 on the left). There is preserved bilateral distal PCA flow. Antegrade flow in both ICA siphons. Tortuous distal cervical ICA on the left. Siphon irregularity in keeping with atherosclerosis but no hemodynamically significant stenosis identified. Ophthalmic artery origins are normal. Carotid termini, MCA and ACA origins are normal. Anterior communicating artery is diminutive or absent. Visualized ACA branches are within normal limits. Mild irregularity of the right MCA M1 segment without significant stenosis. The left M1 segment is normal aside from tortuosity. The right MCA bifurcation is normal. 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 No MCA branch stenosis identified. IMPRESSION: 1. Posterior circulation atherosclerosis with moderate to severe stenosis of the distal right vertebral artery just  proximal to the vertebrobasilar junction. There is mild bilateral PCA P2 segment stenosis. 2. Anterior circulation without significant stenosis. Electronically Signed   By: Genevie Ann M.D.   On: 01/16/2016 20:54   12-lead ECG sinus brady  All prior EKG's in EPIC reviewed with no documented atrial fibrillation  Telemetry sinus brady with PVC's   Assessment and Plan:  1. Cryptogenic stroke The patient presents with cryptogenic stroke.  The patient has a TEE planned for this AM.  I spoke at length with the patient about monitoring for afib with either a 30 day event monitor or an implantable loop recorder.  Risks, benefits, and alteratives to implantable loop recorder were discussed with the patient today.   At this time, the patient is very clear in their decision to proceed with implantable loop recorder.   Wound care was reviewed with the patient (keep incision clean and dry for 3 days).    2.  CAD  Management per general cardiology  3.  Sinus bradycardia Asymptomatic Will follow heart rates through [place], NP 01/20/2016 7:46 AM  EP attending  Patient seen and examined. I've reviewed the findings as documented above. I agree with the history, physical exam, assessment, and plan. The patient has a cryptogenic stroke. We'll plan to insert an implantable loop recorder. I've discussed indication for the procedure with the patient and his family and they wish to proceed.  Cristopher Peru, M.D.

## 2016-01-21 LAB — GLUCOSE, CAPILLARY
GLUCOSE-CAPILLARY: 159 mg/dL — AB (ref 65–99)
Glucose-Capillary: 163 mg/dL — ABNORMAL HIGH (ref 65–99)
Glucose-Capillary: 225 mg/dL — ABNORMAL HIGH (ref 65–99)

## 2016-01-21 MED ORDER — ASPIRIN 81 MG PO TBEC: 81.0000 mg | DELAYED_RELEASE_TABLET | Freq: Every day | ORAL | 3 refills | Status: DC

## 2016-01-21 MED ORDER — POTASSIUM CHLORIDE CRYS ER 20 MEQ PO TBCR: 20.0000 meq | EXTENDED_RELEASE_TABLET | Freq: Every day | ORAL | 0 refills | Status: DC

## 2016-01-21 MED ORDER — INSULIN DETEMIR 100 UNIT/ML ~~LOC~~ SOLN: 35.0000 [IU] | Freq: Every day | SUBCUTANEOUS | 11 refills | Status: DC

## 2016-01-21 MED ORDER — FUROSEMIDE 40 MG PO TABS: 40.0000 mg | ORAL_TABLET | Freq: Every day | ORAL | 0 refills | Status: DC

## 2016-01-21 MED ORDER — CLOPIDOGREL BISULFATE 75 MG PO TABS: 75.0000 mg | ORAL_TABLET | Freq: Every day | ORAL | 3 refills | Status: DC

## 2016-01-21 MED ORDER — ACETAMINOPHEN 325 MG PO TABS: 650.0000 mg | ORAL_TABLET | ORAL | 0 refills | Status: AC | PRN

## 2016-01-21 MED ORDER — LEVETIRACETAM 500 MG PO TABS: 500.0000 mg | ORAL_TABLET | Freq: Two times a day (BID) | ORAL | 0 refills | Status: AC

## 2016-01-21 MED ORDER — CARVEDILOL 3.125 MG PO TABS: 3.1250 mg | ORAL_TABLET | Freq: Two times a day (BID) | ORAL | 3 refills | Status: DC

## 2016-01-21 MED ORDER — ATORVASTATIN CALCIUM 80 MG PO TABS: 80.0000 mg | ORAL_TABLET | Freq: Every day | ORAL | 3 refills | Status: DC

## 2016-01-21 NOTE — Progress Notes (Signed)
STROKE TEAM PROGRESS NOTE   SUBJECTIVE (INTERVAL HISTORY) Wife at the bedside. TEE neg yesterday small PFO but LE venous dopplers negative. Loop placed..   OBJECTIV E Temp:  [98.1 F (36.7 C)-98.7 F (37.1 C)] 98.2 F (36.8 C) (01/09 0543) Pulse Rate:  [42-71] 54 (01/09 0543) Cardiac Rhythm: Sinus bradycardia (01/09 0700) Resp:  [14-20] 18 (01/09 0543) BP: (138-226)/(58-133) 154/62 (01/09 0543) SpO2:  [93 %-98 %] 96 % (01/09 0543) Weight:  [84.4 kg (186 lb 1.1 oz)] 84.4 kg (186 lb 1.1 oz) (01/09 0543)  CBC:   Recent Labs Lab 01/15/16 0420 01/16/16 0353  WBC 8.6 8.7  NEUTROABS 6.8  --   HGB 13.2  12.9* 12.0*  HCT 39.8  38.0* 36.4*  MCV 77.7* 77.6*  PLT 172 0000000    Basic Metabolic Panel:   Recent Labs Lab 01/15/16 1256  01/19/16 0258 01/20/16 0424  NA  --   < > 138 136  K  --   < > 4.1 4.2  CL  --   < > 104 103  CO2  --   < > 25 24  GLUCOSE  --   < > 190* 183*  BUN  --   < > 23* 25*  CREATININE  --   < > 1.37* 1.31*  CALCIUM  --   < > 8.8* 8.9  MG 2.1  --   --   --   < > = values in this interval not displayed.  Lipid Panel:     Component Value Date/Time   CHOL 215 (H) 01/17/2016 0416   TRIG 238 (H) 01/17/2016 0416   HDL 27 (L) 01/17/2016 0416   CHOLHDL 8.0 01/17/2016 0416   VLDL 48 (H) 01/17/2016 0416   LDLCALC 140 (H) 01/17/2016 0416   HgbA1c:  Lab Results  Component Value Date   HGBA1C 7.7 (H) 01/16/2016   Urine Drug Screen:     Component Value Date/Time   LABOPIA NONE DETECTED 01/15/2016 1021   COCAINSCRNUR NONE DETECTED 01/15/2016 1021   LABBENZ NONE DETECTED 01/15/2016 1021   AMPHETMU NONE DETECTED 01/15/2016 1021   THCU NONE DETECTED 01/15/2016 1021   LABBARB NONE DETECTED 01/15/2016 1021      IMAGING  TEE Small PFO.  LE venous dopplers negative    PHYSICAL EXAM General - Well nourished, well developed, in no apparent distress.  Ophthalmologic - Fundi not visualized due to eye movement.  Cardiovascular - Regular rate and  rhythm.  Mental Status -  Level of arousal and orientation to time, place, and person were intact. Language including expression, naming, repetition, comprehension was assessed and found intact. Fund of Knowledge was assessed and was  diminished.  Cranial Nerves II - XII - II - Visual field intact OU. III, IV, VI - Extraocular movements intact. V - Facial sensation intact bilaterally. VII - Facial movement intact bilaterally. VIII - Hearing & vestibular intact bilaterally. X - Palate elevates symmetrically. XI - Chin turning & shoulder shrug intact bilaterally. XII - Tongue protrusion intact.  Motor Strength - The patient's strength was equal and intact in all extremities and pronator drift was absent.  Bulk was normal and fasciculations were absent.   Motor Tone - Muscle tone was assessed at the neck and appendages and was normal.  Reflexes - The patient's reflexes were 1+ in all extremities and he had no pathological reflexes.  Sensory - Light touch, temperature/pinprick were assessed and were symmetrical.    Coordination - The patient had normal movements  in the hands with no ataxia or dysmetria.  Tremor was absent.  Gait and Station - deferred   ASSESSMENT/PLAN Carl Stephenson is a 71 y.o. 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 Emergent cardiac cath did not find lesions responsible for acute presentation. MRI revealed mult small R brain embolic infarcts  He did not receive IV t-PA.   Stroke:  R brain, anterior and posterior circulation infarcts, felt to be cardioembolic with recent cardiac event.   MRI  R MCA, R MCA/ACA, R PCA and R cerebellar punctate/patchy infarcts.   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 No source of embolus   TEE  Small PFO. No clot  implantable loop recorder placed 01/20/2016   LDL 140  HgbA1c 7.7  Heparin 5000 units sq tid for VTE prophylaxis Diet heart healthy/carb modified Room service appropriate? Yes; Fluid consistency: Thin  aspirin 325 mg daily and clopidogrel 75 mg daily prior to admission, now on aspirin 81 mg daily and clopidogrel 75 mg daily. Continue on discharge for stroke and cardiac prevention.  Patient counseled to be compliant with his antithrombotic medications  Ongoing aggressive stroke risk factor management  Therapy recommendations: Home health PT and OT   Disposition:  pending  Follow up [name] in stroke clinic in 2 months  Seizure  GTC spell followed with postictal  Suspected prolonged postictal state  Continue Keppra  EEG no seizure but right focal slowing Seizure precautions. No driving until 6 month of seizure free. Do not participate in activities where a loss of awareness could hurt you or someone else.  No swimming alone, no tub bathing, no hot tubs, no driving, no operating motorized vehicles(cars, ATVs, motorbikes, etc), lawnmowers or power tools.  No standing at heights, such as rooftops, ladders or stairs. No sliding boards, monkey bars or swings, or climbing trees.  Avoid hot objects such as stoves, heaters, open fires.  Wear a helmet when riding a bicycle, scooter, skateboard, etc. and avoid areas of traffic.  Set water heater to 120 degrees or less. No driving for 6 months per [place] or defer to locality where you live.  Hypertension  Cardiology restarted lisinopril after creatinine improved  Follow creatinine  Continuing low dose BB  BP goals from neuro standpoint - pts with stroke and end organ damage - our goals are 180/105   CAD s/p stent  Extensive hx of CAD   EF 45-50%  Cardiac cath showed multi vessel CAD but no acute intervention - mult PCI to RCA, CFx and  RI w/ DES to LAD 2015; cath this adm w/ this adm w/ mult stents patent to LAD, ramus intermediate, LCx, prox RCA  Frequent PVCs  Suspect the seizure could be induced by arrhythmia   Cardiology on board  History of stroke  2015 MRI left frontal and right MCA/PCA punctate infarcts, old pontine, left thalamic and right CR lacunar infarcts  2 days post cardiac cath and stent placement. TEE and loop not completed.  CTA h/n unremarkable at that time  On DAPT and crestor at home  Bradycardia  Coreg 3.125 mg twice daily  Hyperlipidemia  Home meds:  crestor 10, fenofibrate  and zetia  LDL 140, goal < 70  Changed crestor to lipitor 80 by primary service  Continue statin at discharge  Diabetes  HgbA1c 7.7, goal < 7.0  Uncontrolled  Diabetes nurse following - recommends half of home Levemir dose  SSI  Close PCP following  Other Stroke Risk Factors  Advanced age  Obesity, Body mass index is 30.03 kg/m., recommend weight loss, diet and exercise as appropriate   Acute diastolic CHF   PLAN  DC home father/U as outpatient stroke clinic in 6 weeks.stroke team will sign off.   Hospital day # 6   Personally examined patient and images, and have participated in and made any corrections needed to history, physical, neuro exam,assessment and plan as stated above.  I have personally obtained the history, evaluated lab date, reviewed imaging studies and agree with radiology interpretations. Long d/w wife at bedside and answered questions.  Stroke team will sign off. Antony Contras, MD  [facility] [address] [place], [place] [address]  Phone [phone] Fax [phone]    To contact Stroke Continuity provider, please refer to [email or web address] After hours, contact General Neurology

## 2016-01-21 NOTE — Progress Notes (Signed)
Physical Therapy Treatment Patient Details Name: Carl Stephenson MRN: [id number] DOB: 1945/07/24 Today's Date: 01/21/2016    History of Present Illness Pt is a 71 y/o male admitted after falling at home secondary to a seizure and found to have had an acute CVA (R brain, anterior and posterior circulation infarcts). PMH including but not limited to previous CVA in 2012, HTN, CAD, DM, hx of MI in 2010 and stent placement in 2015.    PT Comments    Pt with improved gait pattern and ability to complete Heel raises however con't to benefit from RW for safe ambulation and 24/7 assist initially as pt at increased falls risk.  Follow Up Recommendations  Home health PT;Supervision/Assistance - 24 hour     Equipment Recommendations  Rolling walker with 5" wheels    Recommendations for Other Services       Precautions / Restrictions Precautions Precautions: Fall Restrictions Weight Bearing Restrictions: No    Mobility  Bed Mobility Overal bed mobility: Needs Assistance Bed Mobility: Supine to Sit     Supine to sit: Min guard     General bed mobility comments: hob flat, increased time, labored effort  Transfers Overall transfer level: Needs assistance Equipment used: Rolling walker (2 wheeled) Transfers: Sit to/from Stand Sit to Stand: Min guard         General transfer comment: pt with good technique, just increaed time  Ambulation/Gait Ambulation/Gait assistance: Min guard;Min assist Ambulation Distance (Feet): 200 Feet Assistive device: Rolling walker (2 wheeled);None Gait Pattern/deviations: Step-through pattern;Decreased step length - right;Decreased stance time - left;Decreased stride length Gait velocity: slow Gait velocity interpretation: Below normal speed for age/gender General Gait Details: pt with improved gait pattern without RW however with onset of fatigue s/p 20 feet pt then with short shuffled steps and unsteady. pt with improved step length with RW,  minA to keep RW moving at same pace.   Stairs Stairs: Yes   Stair Management: No rails;Step to pattern (HHA on the L) Number of Stairs: 2 (to mimic home set up) General stair comments: family reports there is a bench he can use as a hand rail. eduated daughter on how to safely assist father on the stairs  Wheelchair Mobility    Modified Rankin (Stroke Patients Only) Modified Rankin (Stroke Patients Only) Pre-Morbid Rankin Score: No significant disability Modified Rankin: Moderate disability     Balance           Standing balance support: During functional activity;No upper extremity supported Standing balance-[name]: Poor                      Cognition Arousal/Alertness: Awake/alert Behavior During Therapy: Flat affect Overall Cognitive Status: Impaired/Different from baseline Area of Impairment: Safety/judgement;Awareness;Problem solving   Current Attention Level: Selective   Following Commands: Follows multi-step commands with increased time Safety/Judgement: Decreased awareness of safety;Decreased awareness of deficits Awareness: Emergent Problem Solving: Slow processing General Comments: pt improved from yesterday, pt quiet and a "man of few words" according to family    Exercises General Exercises - Lower Extremity Hip Flexion/Marching: AROM;10 reps;Right;Standing Heel Raises: AROM;Both;20 reps;Standing Mini-Sqauts: AROM;Both;10 reps;Standing    General Comments        Pertinent Vitals/Pain Pain Assessment: No/denies pain    Home Living                      Prior Function            PT Goals (current  goals can now be found in the care plan section) Acute Rehab PT Goals Patient Stated Goal: home Progress towards PT goals: Not progressing toward goals - comment    Frequency    Min 4X/week      PT Plan Current plan remains appropriate    Co-evaluation             End of Session Equipment Utilized During  Treatment: Gait belt Activity Tolerance: Patient tolerated treatment well Patient left: in chair;with call bell/phone within reach;with family/visitor present;with nursing/sitter in room     Time: [id number] PT Time Calculation (min) (ACUTE ONLY): 33 min  Charges:  $Gait Training: 8-22 mins $Therapeutic Exercise: 8-22 mins                    G Codes:      Averill Winters M Jasmia Angst 02-08-2016, 8:53 AM   Kittie Plater, PT, DPT Pager #: [phone] Office #: [phone]

## 2016-01-21 NOTE — Discharge Summary (Addendum)
Physician Discharge Summary  Carl Stephenson [id number] DOB: [birth year] DOA: 01/15/2016  PCP: [name], MD  Admit date: 01/15/2016 Discharge date: 01/21/2016  Recommendations for Outpatient Follow-up:  Continue aspirin, plavix, Lipitor on discharge Follow up with neurology as recommended  May continue insulin at 35 units at supper and increase depending on CBG's  Before a meal (preprandial plasma glucose): 70-130   1-2 hours after beginning of the meal (Postprandial plasma glucose): Less than 180    Continue lasix and potassium supplementation as prescribed.  Discharge Diagnoses:  Active Problems:   Hyperlipidemia   HYPERTENSION, BENIGN   CAD, NATIVE VESSEL   Acute encephalopathy   LBBB (left bundle branch block)   Coronary artery disease involving coronary bypass graft of native heart with angina pectoris (HCC)   ECG abnormality   History of CVA (cerebrovascular accident)   Lactic acidosis   Convulsions (HCC)   Bradycardia   Abnormal EKG    Discharge Condition: stable; home health orders placed   Diet recommendation: as tolerated   History of present illness:  71yo man with PMHx of CAD with prior MI and PCI, type 2 DM, prior CVA in 2015, and bladder cancer who presented to the ED on 01/15/16 after being found unresponsive at home. Per family he went to the bathroom tonight and when he got back he sat down and then suddenly moaned out and sprawled out his arms and legs stiffly with his eyes rolling to the back of his head. Family denies any whole body shaking. Family states he was unresponsive so EMS was called. He was brought by [facility] with AMS and EKG changes with probable STEMI. Patient not complaining of any chest pain or shortness of breath. Upon arrival to [facility], he was taken to the cardiac cath lab which showed 2 vessel obstructive CAD with diffuse mid-LAD disease and diffuse disease in the 1st OM, multiple stents patent, and a moderately elevated LVEDP. Family  reports he has not been acting himself for the last few days. He has been "spacing out" and they will have to redirect him to regain his attention. Family also notes decreased PO intake lately. Wife reports he was supposed to see a Neurologist due to his eyes crossing and supposedly "fluid in the eyes."   SIGNIFICANT EVENTS: 1/3>>Found unresponsive at home. EKG changes with apparent STEMI and taken to cath lab  1/3>>Cath without significant changes   Hospital Course:    Assessment & Plan:   Acute metabolic encephalopathy / Acute CVA R brain, anterior and posterior circulation infarcts, felt to be cardioembolic withrecent cardiac event - MRIR MCA, R MCA/ACA, R PCA and R cerebellar punctate/patchy infarcts - MRAModerate to severe stenosis R VA and B P2 - CarotidDoppler showed 1-39% internal carotid artery stenosis bilaterally. Vertebral arteries are patent with antegrade flow. - 2D EchoEF 45-50% - LE doppler with no DVT - TEE and loop recorder placed 1/8 - LDL140 - HgbA1c7.7 - Continue aspirin 81 mg daily and clopidogrel 75 mg daily. - HH orders for PT, RN, OT and aide in place   Seizure - Suspected prolonged postictal state - Continue Keppra as prescribed  - EEG no seizure but right focal slowing  Hypertension, essential  - Cardiology restarted lisinopril after creatinine improved but cr up again at 1.37 so will hold lisinopril - He is also on lasix but we held lisinopril only to see if kidney fxn remains stable  - Cr stable at 1.31 - Continue carvedilol for blood pressure control  -  Continue to hold lisinopril   CAD s/p stent - Extensive hx of CAD  - EF 45-50% - Cardiac cath showed multi vessel CAD but no acute intervention - mult PCI to RCA, CFx and RI w/ DES to LAD 2015; cath this adm w/ this adm w/ mult stents patent to LAD, ramus intermediate, LCx, prox RCA - Continue aspirin and plavix on discharge   Chronic systolic CHF - Continue lasix and coreg on  discharge - Continue potassium supplementation on discharge  - 2 D ECHO with EF 45%  History of stroke - 2015 MRI left frontal and right MCA/PCA punctate infarcts, old pontine, left thalamic and right CR lacunar infarcts - 2 days post cardiac cath and stent placement. 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 Multiple stents patent including the proximal LAD, ramus intermediate, LCx, and proximal RCA. Moderately elevated LVEDP  EEG  01/15/2016 - diffuse cerebral dysfunction that is non-specific in etiology and can be seen with hypoxic/ischemic injury, toxic/metabolic encephalopathies, neurodegenerative disorders, or medication effect. Additional focal slowing over the right hemisphere indicates focal cerebral dysfunction in this region suggestive of underlying structural or physiologic abnormality.The absence of epileptiform discharges does not rule out a clinical diagnosis of epilepsy. Clinical correlation is advised.  2 D ECHO 01/16/2016 - EF 45-50%, diffuse hypokinesis, moderate LVH  LE doppler bilateral 01/17/2016 - negative for DVT  Carotid doppler 01/17/2016 - Findings suggest 1-39% internal carotid artery stenosis bilaterally. Vertebral arteries are patent with  antegrade flow.  Antimicrobials:   None     Signed:  Leisa Lenz, MD  [facility] 01/21/2016, 3:50 PM  Pager #: [phone]  Time spent in minutes: less than 30 minutes   Discharge Exam: Vitals:   01/21/16 1032 01/21/16 1320  BP: (!) 116/58 (!) 137/57  Pulse: (!) 51 (!) 53  Resp:  20  Temp:  98.4 F (36.9 C)   Vitals:   01/21/16 0543 01/21/16 1020 01/21/16 1032 01/21/16 1320  BP: (!) 154/62  (!) 116/58 (!) 137/57  Pulse: (!) 54 (!) 59 (!) 51 (!) 53  Resp: 18   20  Temp: 98.2 F (36.8 C)   98.4 F (36.9 C)  TempSrc: Oral   Oral  SpO2: 96% 94%  96%  Weight: 84.4 kg (186 lb 1.1 oz)     Height:        General: Pt is alert, follows commands appropriately, not in acute distress Cardiovascular: Regular rate and rhythm, S1/S2 + Respiratory: Clear to auscultation bilaterally, no wheezing, no crackles, no rhonchi Abdominal: Soft, non tender, non distended, bowel sounds +, no guarding Extremities: no cyanosis, pulses palpable bilaterally DP and PT Neuro: Grossly nonfocal  Discharge Instructions  Discharge Instructions    Ambulatory referral to Neurology    Complete by:  As directed    Stroke patient. [name] prefers follow up in 6 weeks   Call MD for:  persistant nausea and vomiting    Complete by:  As directed    Call MD for:  redness, tenderness, or signs of infection (pain, swelling, redness, odor or green/yellow discharge around incision site)    Complete by:  As directed    Call MD for:  severe uncontrolled pain    Complete by:  As directed    Diet - low sodium heart healthy    Complete by:  As directed    Discharge instructions    Complete by:  As directed    Continue aspirin, plavix, Lipitor on discharge Follow up with neurology as recommended  May continue insulin at 35 units at supper and increase depending on CBG's  Before a meal (preprandial plasma glucose): 70-130   1-2 hours after beginning of the meal (Postprandial plasma glucose):  Less than 180   Continue lasix and potassium supplementation as prescribed.   Increase activity slowly    Complete by:  As directed      Allergies as of 01/21/2016      Reactions   Nitrofurantoin Monohyd Macro Hives, Rash      Medication List    STOP taking these medications   lisinopril 10 MG tablet Commonly known as:  PRINIVIL,ZESTRIL   NOVOLOG FLEXPEN 100 UNIT/ML FlexPen Generic drug:  insulin aspart   rosuvastatin 10 MG tablet Commonly known as:  CRESTOR     TAKE these medications   acetaminophen 325 MG tablet Commonly known as:  TYLENOL Take 2 tablets (650 mg total) by mouth every 4 (four) hours as needed for headache or mild pain.   aspirin 81 MG EC tablet Take 1 tablet (81 mg total) by mouth daily. Start taking on:  01/22/2016 What changed:  medication strength  how much to take   atorvastatin 80 MG tablet Commonly known as:  LIPITOR Take 1 tablet (80 mg total) by mouth daily at 6 PM.   carvedilol 3.125 MG tablet Commonly known as:  COREG Take 1 tablet (3.125 mg total) by mouth 2 (two) times daily with a meal. What changed:  medication strength  how much to take   clopidogrel 75 MG tablet Commonly known as:  PLAVIX Take 1 tablet (75 mg total) by mouth daily with breakfast. Start taking on:  01/22/2016 What changed:  when to take this   fenofibrate 160 MG tablet Take 160 mg by mouth daily.   fexofenadine 180 MG tablet Commonly known as:  ALLEGRA Take 180 mg by mouth daily.   furosemide 40 MG tablet Commonly known as:  LASIX Take 1 tablet (40 mg total) by mouth daily. Start taking on:  01/22/2016   insulin detemir 100 UNIT/ML injection Commonly known as:  LEVEMIR Inject 0.35 mLs (35 Units total) into the skin daily with supper. What changed:  how much to take   levETIRAcetam 500 MG tablet Commonly known as:  KEPPRA Take 1 tablet (500 mg total) by mouth 2 (two) times daily.   nitroGLYCERIN 0.4 MG SL tablet Commonly known as:   NITROSTAT Place 1 tablet (0.4 mg total) under the tongue every 5 (five) minutes as needed. May repeat for up to 3 doses. What changed:  reasons to take this  additional instructions   pantoprazole 40 MG tablet Commonly known as:  PROTONIX Take 40 mg  by mouth daily.   potassium chloride SA 20 MEQ tablet Commonly known as:  K-DUR,KLOR-CON Take 1 tablet (20 mEq total) by mouth daily. Start taking on:  01/22/2016   sertraline 100 MG tablet Commonly known as:  ZOLOFT Take 100 mg by mouth daily.   triamcinolone 0.1 % paste Commonly known as:  KENALOG Use as directed 1 application in the mouth or throat 2 (two) times daily as needed (wound care (mouth)).            Durable Medical Equipment        Start     Ordered   01/20/16 1452  For home use only DME Walker rolling  Once    Question:  Patient needs a walker to treat with the following condition  Answer:  General weakness   01/20/16 [place] Follow up.   Why:  rolling walker arranged- to be delivered to room prior to discharge Contact information: [place] [address] [facility] Follow up.   Specialty:  [place] Why:  HHRN/PT/OT/aide arranged Contact information: [address] [place] [address] [phone]        SETHI,PRAMOD, MD Follow up in 6 week(s).   Specialties:  Neurology, Radiology Why:  stroke clinic. office will call with appt date and time Contact information: [place] [address] [phone]        [name], MD. Schedule an appointment as soon as possible for a visit.   Specialty:  Family Medicine Contact information: [place] [phone]            The results of significant diagnostics from this hospitalization (including imaging, microbiology, ancillary and laboratory) are listed  below for reference.    Significant Diagnostic Studies: Ct Head Wo Contrast  Result Date: 01/15/2016 CLINICAL DATA:  Code STEMI. Altered mental status. Assess for hemorrhage. History of hypertension, hyperlipidemia, cancer, stroke. EXAM: CT HEAD WITHOUT CONTRAST TECHNIQUE: Contiguous axial images were obtained from the base of the skull through the vertex without intravenous contrast. COMPARISON:  CT HEAD July 17, 2015 and MRI head December 29, 2013 FINDINGS: BRAIN: The ventricles and sulci are normal for age. No intraparenchymal hemorrhage, mass effect nor midline shift. Patchy to confluent supratentorial and pontine white matter hypodensities. Chronic appearing bilateral thalamus and basal ganglia lacunar infarcts. No acute large vascular territory infarcts. No abnormal extra-axial fluid collections. Basal cisterns are patent. VASCULAR: Moderate calcific atherosclerosis of the carotid siphons. SKULL: No skull fracture. No significant scalp soft tissue swelling. SINUSES/ORBITS: The mastoid air-cells and included paranasal sinuses are well-aerated. Status post LEFT ocular lens implant. The included ocular globes and orbital contents are non-suspicious. OTHER: None. IMPRESSION: No acute intracranial process. Chronic changes including moderate to severe chronic small vessel ischemic disease and old deep gray nuclei lacunar infarcts. Electronically Signed   By: Elon Alas M.D.   On: 01/15/2016 04:57   Mr Virgel Paling [id number] Contrast  Result Date: 01/16/2016 CLINICAL DATA:  71 y/o male with encephalopathy. Four small foci of restricted diffusion in the right brain on brain MRI earlier today. Initial encounter. EXAM: MRA HEAD WITHOUT CONTRAST TECHNIQUE: Angiographic images of the Circle of Willis were obtained using MRA technique without intravenous contrast. COMPARISON:  Brain MRI 1241 hours today. FINDINGS: Antegrade flow in the posterior circulation. The  distal left vertebral artery is dominant. There is moderate  to severe stenosis of the distal right vertebral artery just proximal to the vertebrobasilar junction. The more proximal right vertebral appears normal. Both PICA origins appear normal. Patent basilar artery without irregularity or stenosis. Shared origins of the bilateral PCAs and ICAs appear within normal limits. Posterior communicating arteries are diminutive or absent. There is mild irregularity and stenosis in the bilateral P2 PCA segments (series 305, image 2 on the right and image 17 on the left). There is preserved bilateral distal PCA flow. Antegrade flow in both ICA siphons. Tortuous distal cervical ICA on the left. Siphon irregularity in keeping with atherosclerosis but no hemodynamically significant stenosis identified. Ophthalmic artery origins are normal. Carotid termini, MCA and ACA origins are normal. Anterior communicating artery is diminutive or absent. Visualized ACA branches are within normal limits. Mild irregularity of the right MCA M1 segment without significant stenosis. The left M1 segment is normal aside from tortuosity. The right MCA bifurcation is normal. 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 No MCA branch stenosis identified. IMPRESSION: 1. Posterior circulation atherosclerosis with moderate to severe stenosis of the distal right vertebral artery just proximal to the vertebrobasilar junction. There is mild bilateral PCA P2 segment stenosis. 2. Anterior circulation without significant stenosis. Electronically Signed   By: Genevie Ann M.D.   On: 01/16/2016 20:54   Mr Brain Wo Contrast  Result Date: 01/16/2016 CLINICAL DATA:  71 year old male with encephalopathy status post code STEMI. Possible prolonged seizure and extended postictal state. Initial encounter. EXAM: MRI HEAD WITHOUT CONTRAST TECHNIQUE: Multiplanar, multiecho pulse sequences  of the brain and surrounding structures were obtained without intravenous contrast. COMPARISON:  Head CT 01/15/2016 and earlier.  Brain MRI 12/29/2013. FINDINGS: Brain: Extensive chronic small vessel disease in the pons and bilateral deep gray matter nuclei. Chronic Patchy and confluent bilateral cerebral white matter T2 and FLAIR hyperintensity also likely chronic small vessel ischemia related. There are 4 small foci of restricted diffusion identified. The largest is in the posterior right cerebellum measuring about 10 mm. Three subcentimeter areas then are identified in the right cerebral hemisphere, but to or in the posterior right MCA territory while one is in the dorsal right thalamus (right PCA territory). No left hemisphere restricted diffusion. Mild associated T2 and FLAIR hyperintensity. No acute hemorrhage or mass effect. No diffusion abnormality in the mesial temporal lobes. The hippocampal formations appear grossly stable and symmetric. No midline shift, mass effect, evidence of mass lesion, ventriculomegaly, extra-axial collection or acute intracranial hemorrhage. Cervicomedullary junction and pituitary are within normal limits. Possible mild chronic blood products in the left ventral pons. Vascular: Major intracranial vascular flow voids are stable since 2015 and appear normal aside from mild generalized intracranial artery dolichoectasia. Skull and upper cervical spine: Visible cervical spine degraded by motion, grossly stable. Visualized bone marrow signal is within normal limits. Sinuses/Orbits: Interval postoperative changes to the left globe, otherwise negative orbits soft tissues. Visualized paranasal sinuses and mastoids are stable and well pneumatized. Other: Visible internal auditory structures appear normal. Negative scalp soft tissues. IMPRESSION: 1. For small nodular foci of restricted diffusion in the right brain which most resemble small acute infarcts. These are present in the right MCA,  right PCA, and right cerebellar artery territories which could indicate sequelae of embolic event, although sparing of the left brain is  noted. 2. No associated acute hemorrhage or mass effect. Underlying advanced chronic small vessel disease otherwise appears stable since 2015. Electronically Signed   By: Genevie Ann M.D.   On: 01/16/2016 13:38   Dg Chest Port 1 View  Result Date: 01/16/2016 CLINICAL DATA:  Dyspnea, recent heart catheterization EXAM: PORTABLE CHEST 1 VIEW COMPARISON:  07/17/2015 FINDINGS: Cardiac shadow remains enlarged. The lungs are well aerated bilaterally. No significant congestive failure is noted. No acute bony abnormality is seen. IMPRESSION: No acute abnormality noted. Electronically Signed   By: Inez Catalina M.D.   On: 01/16/2016 07:21    Microbiology: Recent Results (from the past 240 hour(s))  MRSA PCR Screening     Status: None   Collection Time: 01/15/16  5:45 AM  Result Value Ref Range Status   MRSA by PCR NEGATIVE NEGATIVE Final    Comment:        The GeneXpert MRSA Assay (FDA approved for NASAL specimens only), is one component of a comprehensive MRSA colonization surveillance program. It is not intended to diagnose MRSA infection nor to guide or monitor treatment for MRSA infections.   Culture, blood (routine x 2)     Status: None   Collection Time: 01/15/16 12:56 PM  Result Value Ref Range Status   Specimen Description BLOOD RIGHT HAND  Final   Special Requests IN PEDIATRIC BOTTLE  10CC  Final   Culture NO GROWTH 5 DAYS  Final   Report Status 01/20/2016 FINAL  Final  Culture, blood (routine x 2)     Status: None   Collection Time: 01/15/16  1:00 PM  Result Value Ref Range Status   Specimen Description BLOOD RIGHT HAND  Final   Special Requests IN PEDIATRIC BOTTLE  3CC  Final   Culture NO GROWTH 5 DAYS  Final   Report Status 01/20/2016 FINAL  Final     Labs: Basic Metabolic Panel:  Recent Labs Lab 01/15/16 0420 01/15/16 1256 01/16/16 0353  01/18/16 0857 01/19/16 0258 01/20/16 0424  NA 137  138  --  138 136 138 136  K 4.9  4.8  --  3.6 3.4* 4.1 4.2  CL 106  104  --  107 105 104 103  CO2 20*  --  24 23 25 24   GLUCOSE 258*  255*  --  143* 164* 190* 183*  BUN 22*  24*  --  21* 20 23* 25*  CREATININE 1.39*  1.50*  --  1.21 1.20 1.37* 1.31*  CALCIUM 8.7*  --  8.2* 8.7* 8.8* 8.9  MG  --  2.1  --   --   --   --    Liver Function Tests:  Recent Labs Lab 01/15/16 0420  AST 20  ALT 13*  ALKPHOS 61  BILITOT 0.5  PROT 6.1*  ALBUMIN 3.3*   No results for input(s): LIPASE, AMYLASE in the last 168 hours.  Recent Labs Lab 01/15/16 1256  AMMONIA 38*   CBC:  Recent Labs Lab 01/15/16 0420 01/16/16 0353  WBC 8.6 8.7  NEUTROABS 6.8  --   HGB 13.2  12.9* 12.0*  HCT 39.8  38.0* 36.4*  MCV 77.7* 77.6*  PLT 172 160   Cardiac Enzymes:  Recent Labs Lab 01/15/16 0420  TROPONINI 0.08*   BNP: BNP (last 3 results)  Recent Labs  01/16/16 0353  BNP 446.5*    ProBNP (last 3 results) No results for input(s): PROBNP in the last 8760 hours.  CBG:  Recent Labs Lab 01/20/16 1304  01/20/16 1652 01/20/16 2124 01/21/16 0652 01/21/16 1116  GLUCAP 191* 251* 174* 163* 225*

## 2016-01-21 NOTE — Progress Notes (Signed)
Occupational Therapy Treatment Patient Details Name: Carl Stephenson MRN: [id number] DOB: 12-Feb-1945 Today's Date: 01/21/2016    History of present illness Pt is a 71 y/o male admitted after falling at home secondary to a seizure and found to have had an acute CVA (R brain, anterior and posterior circulation infarcts). PMH including but not limited to previous CVA in 2012, HTN, CAD, DM, hx of MI in 2010 and stent placement in 2015.   OT comments  Pt making good progress, overall min guard assist for mobility and for shower and toilet transfers this session.  Will continue to follow for acute care to progress toward modified independent level goals.   Follow Up Recommendations  Home health OT;Supervision/Assistance - 24 hour    Equipment Recommendations  None recommended by OT    Recommendations for Other Services      Precautions / Restrictions Precautions Precautions: Fall Restrictions Weight Bearing Restrictions: No       Mobility Bed Mobility Overal bed mobility: Needs Assistance Bed Mobility: Supine to Sit     Supine to sit: Min guard     General bed mobility comments: hob flat, increased time, labored effort  Transfers Overall transfer level: Needs assistance Equipment used: None Transfers: Sit to/from [name] Sit to Stand: Min guard Stand pivot transfers: Min guard       General transfer comment: Pt with slower mobility and transfers but wife states this was normal baseline.     Balance Overall balance assessment: Needs assistance Sitting-balance support: Feet supported;No upper extremity supported Sitting balance-[name]: Good     Standing balance support: During functional activity;No upper extremity supported Standing balance-[name]: Poor Standing balance comment: Pt needs min guard assist for sit to stand, standing balance, and functional transfers.                    ADL Overall ADL's : Needs  assistance/impaired                         Toilet Transfer: Min guard;Ambulation Toilet Transfer Details (indicate cue type and reason): No assistive device or use of grab bars.     Tub/ Shower Transfer: Walk-in shower;Min guard;Ambulation   Functional mobility during ADLs: Min guard General ADL Comments: Pt able to ambulate with min guard assist.  Demonstrates slower gait speed with decreased weight shift side to side.  Vision assessed further during session as well.  Pt with good occular ROM.  Able to track in all quadrants without difficulty as well as demonstrating good saccadic movement.  Decreased convergence noted in the right eye but did not affect his ability to read larger print without his reading glasses.  Pt reports decreased blurry vision distantly overall but this is not acute since this admission.        Vision Eye Alignment: Within Functional Limits Alignment/Gaze Preference: Within Defined Limits Ocular Range of Motion: Within Functional Limits Tracking/Visual Pursuits: Able to track stimulus in all quads without difficulty Saccades: Within functional limits Convergence: Other (comment) (right eye does not converge)                Cognition   Behavior During Therapy: Flat affect Overall Cognitive Status: Within Functional Limits for tasks assessed Area of Impairment: Safety/judgement;Awareness;Problem solving   Current Attention Level: Selective    Following Commands: Follows multi-step commands consistently Safety/Judgement: Decreased awareness of safety;Decreased awareness of deficits Awareness: Emergent Problem Solving: Slow processing General Comments: pt  improved from yesterday, pt quiet and a "man of few words" according to family      Exercises General Exercises - Lower Extremity Hip Flexion/Marching: AROM;10 reps;Right;Standing Heel Raises: AROM;Both;20 reps;Standing Mini-Sqauts: AROM;Both;10 reps;Standing           Pertinent  Vitals/ Pain       Pain Assessment: No/denies pain         Frequency  Min 2X/week        Progress Toward Goals  OT Goals(current goals can now be found in the care plan section)  Progress towards OT goals: Progressing toward goals  Acute Rehab OT Goals Patient Stated Goal: home  Plan Discharge plan remains appropriate       End of Session Equipment Utilized During Treatment: Gait belt   Activity Tolerance Patient tolerated treatment well   Patient Left in chair;with call bell/phone within reach;with chair alarm set;with family/visitor present   Nurse Communication Mobility status        Time: [id number] OT Time Calculation (min): 23 min  Charges: OT General Charges $OT Visit: 1 Procedure OT Treatments $Self Care/Home Management : 23-37 mins  Persephonie Hegwood  OTR/L 01/21/2016, 10:31 AM

## 2016-01-21 NOTE — Discharge Instructions (Signed)
Atorvastatin tablets °What is this medicine? °ATORVASTATIN (a TORE va sta tin) is known as a HMG-CoA reductase inhibitor or 'statin'. It lowers the level of cholesterol and triglycerides in the blood. This drug may also reduce the risk of heart attack, stroke, or other health problems in patients with risk factors for heart disease. Diet and lifestyle changes are often used with this drug. °This medicine may be used for other purposes; ask your health care provider or pharmacist if you have questions. °COMMON BRAND NAME(S): Lipitor °What should I tell my health care provider before I take this medicine? °They need to know if you have any of these conditions: °-frequently drink alcoholic beverages °-history of stroke, TIA °-kidney disease °-liver disease °-muscle aches or weakness °-other medical condition °-an unusual or allergic reaction to atorvastatin, other medicines, foods, dyes, or preservatives °-pregnant or trying to get pregnant °-breast-feeding °How should I use this medicine? °Take this medicine by mouth with a glass of water. Follow the directions on the prescription label. You can take this medicine with or without food. Take your doses at regular intervals. Do not take your medicine more often than directed. °Talk to your pediatrician regarding the use of this medicine in children. While this drug may be prescribed for children as young as 10 years old for selected conditions, precautions do apply. °Overdosage: If you think you have taken too much of this medicine contact a poison control center or emergency room at once. °NOTE: This medicine is only for you. Do not share this medicine with others. °What if I miss a dose? °If you miss a dose, take it as soon as you can. If it is almost time for your next dose, take only that dose. Do not take double or extra doses. °What may interact with this medicine? °Do not take this medicine with any of the following medications: °-red yeast  rice °-telaprevir °-telithromycin °-voriconazole °This medicine may also interact with the following medications: °-alcohol °-antiviral medicines for HIV or AIDS °-boceprevir °-certain antibiotics like clarithromycin, erythromycin, troleandomycin °-certain medicines for cholesterol like fenofibrate or gemfibrozil °-cimetidine °-clarithromycin °-colchicine °-cyclosporine °-digoxin °-male hormones, like estrogens or progestins and birth control pills °-grapefruit juice °-medicines for fungal infections like fluconazole, itraconazole, ketoconazole °-niacin °-rifampin °-spironolactone °This list may not describe all possible interactions. Give your health care provider a list of all the medicines, herbs, non-prescription drugs, or dietary supplements you use. Also tell them if you smoke, drink alcohol, or use illegal drugs. Some items may interact with your medicine. °What should I watch for while using this medicine? °Visit your doctor or health care professional for regular check-ups. You may need regular tests to make sure your liver is working properly. °Tell your doctor or health care professional right away if you get any unexplained muscle pain, tenderness, or weakness, especially if you also have a fever and tiredness. Your doctor or health care professional may tell you to stop taking this medicine if you develop muscle problems. If your muscle problems do not go away after stopping this medicine, contact your health care professional. °This drug is only part of a total heart-health program. Your doctor or a dietician can suggest a low-cholesterol and low-fat diet to help. Avoid alcohol and smoking, and keep a proper exercise schedule. °Do not use this drug if you are pregnant or breast-feeding. Serious side effects to an unborn child or to an infant are possible. Talk to your doctor or pharmacist for more information. °This medicine may affect   blood sugar levels. If you have diabetes, check with your doctor  or health care professional before you change your diet or the dose of your diabetic medicine. If you are going to have surgery tell your health care professional that you are taking this drug. What side effects may I notice from receiving this medicine? Side effects that you should report to your doctor or health care professional as soon as possible: -allergic reactions like skin rash, itching or hives, swelling of the face, lips, or tongue -dark urine -fever -joint pain -muscle cramps, pain -redness, blistering, peeling or loosening of the skin, including inside the mouth -trouble passing urine or change in the amount of urine -unusually weak or tired -yellowing of eyes or skin Side effects that usually do not require medical attention (report to your doctor or health care professional if they continue or are bothersome): -constipation -heartburn -stomach gas, pain, upset This list may not describe all possible side effects. Call your doctor for medical advice about side effects. You may report side effects to FDA at 1-800-FDA-1088. Where should I keep my medicine? Keep out of the reach of children. Store at room temperature between 20 to 25 degrees C (68 to 77 degrees F). Throw away any unused medicine after the expiration date. NOTE: This sheet is a summary. It may not cover all possible information. If you have questions about this medicine, talk to your doctor, pharmacist, or health care provider.  2017 Elsevier/Gold Standard (2010-11-18 [id number])

## 2016-01-24 DIAGNOSIS — I110 Hypertensive heart disease with heart failure: Secondary | ICD-10-CM | POA: Diagnosis not present

## 2016-01-24 DIAGNOSIS — Z9181 History of falling: Secondary | ICD-10-CM | POA: Diagnosis not present

## 2016-01-24 DIAGNOSIS — Z794 Long term (current) use of insulin: Secondary | ICD-10-CM | POA: Diagnosis not present

## 2016-01-24 DIAGNOSIS — I69352 Hemiplegia and hemiparesis following cerebral infarction affecting left dominant side: Secondary | ICD-10-CM | POA: Diagnosis not present

## 2016-01-24 DIAGNOSIS — I25110 Atherosclerotic heart disease of native coronary artery with unstable angina pectoris: Secondary | ICD-10-CM | POA: Diagnosis not present

## 2016-01-24 DIAGNOSIS — G40909 Epilepsy, unspecified, not intractable, without status epilepticus: Secondary | ICD-10-CM | POA: Diagnosis not present

## 2016-01-24 DIAGNOSIS — E1143 Type 2 diabetes mellitus with diabetic autonomic (poly)neuropathy: Secondary | ICD-10-CM | POA: Diagnosis not present

## 2016-01-24 DIAGNOSIS — I5022 Chronic systolic (congestive) heart failure: Secondary | ICD-10-CM | POA: Diagnosis not present

## 2016-01-24 DIAGNOSIS — Z7982 Long term (current) use of aspirin: Secondary | ICD-10-CM | POA: Diagnosis not present

## 2016-01-27 DIAGNOSIS — E1165 Type 2 diabetes mellitus with hyperglycemia: Secondary | ICD-10-CM | POA: Diagnosis not present

## 2016-01-27 DIAGNOSIS — Z8673 Personal history of transient ischemic attack (TIA), and cerebral infarction without residual deficits: Secondary | ICD-10-CM | POA: Diagnosis not present

## 2016-01-27 DIAGNOSIS — E782 Mixed hyperlipidemia: Secondary | ICD-10-CM | POA: Diagnosis not present

## 2016-01-27 DIAGNOSIS — Z794 Long term (current) use of insulin: Secondary | ICD-10-CM | POA: Diagnosis not present

## 2016-01-28 DIAGNOSIS — E1143 Type 2 diabetes mellitus with diabetic autonomic (poly)neuropathy: Secondary | ICD-10-CM | POA: Diagnosis not present

## 2016-01-28 DIAGNOSIS — I110 Hypertensive heart disease with heart failure: Secondary | ICD-10-CM | POA: Diagnosis not present

## 2016-01-28 DIAGNOSIS — I5022 Chronic systolic (congestive) heart failure: Secondary | ICD-10-CM | POA: Diagnosis not present

## 2016-01-28 DIAGNOSIS — I69352 Hemiplegia and hemiparesis following cerebral infarction affecting left dominant side: Secondary | ICD-10-CM | POA: Diagnosis not present

## 2016-01-28 DIAGNOSIS — G40909 Epilepsy, unspecified, not intractable, without status epilepticus: Secondary | ICD-10-CM | POA: Diagnosis not present

## 2016-01-28 DIAGNOSIS — I25110 Atherosclerotic heart disease of native coronary artery with unstable angina pectoris: Secondary | ICD-10-CM | POA: Diagnosis not present

## 2016-02-03 DIAGNOSIS — I110 Hypertensive heart disease with heart failure: Secondary | ICD-10-CM | POA: Diagnosis not present

## 2016-02-03 DIAGNOSIS — G40909 Epilepsy, unspecified, not intractable, without status epilepticus: Secondary | ICD-10-CM | POA: Diagnosis not present

## 2016-02-03 DIAGNOSIS — I25110 Atherosclerotic heart disease of native coronary artery with unstable angina pectoris: Secondary | ICD-10-CM | POA: Diagnosis not present

## 2016-02-03 DIAGNOSIS — I5022 Chronic systolic (congestive) heart failure: Secondary | ICD-10-CM | POA: Diagnosis not present

## 2016-02-03 DIAGNOSIS — E1143 Type 2 diabetes mellitus with diabetic autonomic (poly)neuropathy: Secondary | ICD-10-CM | POA: Diagnosis not present

## 2016-02-03 DIAGNOSIS — I69352 Hemiplegia and hemiparesis following cerebral infarction affecting left dominant side: Secondary | ICD-10-CM | POA: Diagnosis not present

## 2016-02-05 DIAGNOSIS — I5022 Chronic systolic (congestive) heart failure: Secondary | ICD-10-CM | POA: Diagnosis not present

## 2016-02-05 DIAGNOSIS — I25110 Atherosclerotic heart disease of native coronary artery with unstable angina pectoris: Secondary | ICD-10-CM | POA: Diagnosis not present

## 2016-02-05 DIAGNOSIS — I69352 Hemiplegia and hemiparesis following cerebral infarction affecting left dominant side: Secondary | ICD-10-CM | POA: Diagnosis not present

## 2016-02-05 DIAGNOSIS — E1143 Type 2 diabetes mellitus with diabetic autonomic (poly)neuropathy: Secondary | ICD-10-CM | POA: Diagnosis not present

## 2016-02-05 DIAGNOSIS — I110 Hypertensive heart disease with heart failure: Secondary | ICD-10-CM | POA: Diagnosis not present

## 2016-02-05 DIAGNOSIS — G40909 Epilepsy, unspecified, not intractable, without status epilepticus: Secondary | ICD-10-CM | POA: Diagnosis not present

## 2016-02-06 DIAGNOSIS — I69352 Hemiplegia and hemiparesis following cerebral infarction affecting left dominant side: Secondary | ICD-10-CM | POA: Diagnosis not present

## 2016-02-06 DIAGNOSIS — E1143 Type 2 diabetes mellitus with diabetic autonomic (poly)neuropathy: Secondary | ICD-10-CM | POA: Diagnosis not present

## 2016-02-06 DIAGNOSIS — I110 Hypertensive heart disease with heart failure: Secondary | ICD-10-CM | POA: Diagnosis not present

## 2016-02-06 DIAGNOSIS — I25110 Atherosclerotic heart disease of native coronary artery with unstable angina pectoris: Secondary | ICD-10-CM | POA: Diagnosis not present

## 2016-02-06 DIAGNOSIS — I5022 Chronic systolic (congestive) heart failure: Secondary | ICD-10-CM | POA: Diagnosis not present

## 2016-02-06 DIAGNOSIS — G40909 Epilepsy, unspecified, not intractable, without status epilepticus: Secondary | ICD-10-CM | POA: Diagnosis not present

## 2016-02-07 DIAGNOSIS — G40909 Epilepsy, unspecified, not intractable, without status epilepticus: Secondary | ICD-10-CM | POA: Diagnosis not present

## 2016-02-07 DIAGNOSIS — I69352 Hemiplegia and hemiparesis following cerebral infarction affecting left dominant side: Secondary | ICD-10-CM | POA: Diagnosis not present

## 2016-02-07 DIAGNOSIS — I110 Hypertensive heart disease with heart failure: Secondary | ICD-10-CM | POA: Diagnosis not present

## 2016-02-07 DIAGNOSIS — I5022 Chronic systolic (congestive) heart failure: Secondary | ICD-10-CM | POA: Diagnosis not present

## 2016-02-07 DIAGNOSIS — E1143 Type 2 diabetes mellitus with diabetic autonomic (poly)neuropathy: Secondary | ICD-10-CM | POA: Diagnosis not present

## 2016-02-07 DIAGNOSIS — I25110 Atherosclerotic heart disease of native coronary artery with unstable angina pectoris: Secondary | ICD-10-CM | POA: Diagnosis not present

## 2016-02-10 DIAGNOSIS — G40909 Epilepsy, unspecified, not intractable, without status epilepticus: Secondary | ICD-10-CM | POA: Diagnosis not present

## 2016-02-10 DIAGNOSIS — I110 Hypertensive heart disease with heart failure: Secondary | ICD-10-CM | POA: Diagnosis not present

## 2016-02-10 DIAGNOSIS — I5022 Chronic systolic (congestive) heart failure: Secondary | ICD-10-CM | POA: Diagnosis not present

## 2016-02-10 DIAGNOSIS — I69352 Hemiplegia and hemiparesis following cerebral infarction affecting left dominant side: Secondary | ICD-10-CM | POA: Diagnosis not present

## 2016-02-10 DIAGNOSIS — E1143 Type 2 diabetes mellitus with diabetic autonomic (poly)neuropathy: Secondary | ICD-10-CM | POA: Diagnosis not present

## 2016-02-10 DIAGNOSIS — I25110 Atherosclerotic heart disease of native coronary artery with unstable angina pectoris: Secondary | ICD-10-CM | POA: Diagnosis not present

## 2016-02-11 DIAGNOSIS — I25110 Atherosclerotic heart disease of native coronary artery with unstable angina pectoris: Secondary | ICD-10-CM | POA: Diagnosis not present

## 2016-02-11 DIAGNOSIS — G40909 Epilepsy, unspecified, not intractable, without status epilepticus: Secondary | ICD-10-CM | POA: Diagnosis not present

## 2016-02-11 DIAGNOSIS — I110 Hypertensive heart disease with heart failure: Secondary | ICD-10-CM | POA: Diagnosis not present

## 2016-02-11 DIAGNOSIS — E1143 Type 2 diabetes mellitus with diabetic autonomic (poly)neuropathy: Secondary | ICD-10-CM | POA: Diagnosis not present

## 2016-02-11 DIAGNOSIS — I5022 Chronic systolic (congestive) heart failure: Secondary | ICD-10-CM | POA: Diagnosis not present

## 2016-02-11 DIAGNOSIS — I69352 Hemiplegia and hemiparesis following cerebral infarction affecting left dominant side: Secondary | ICD-10-CM | POA: Diagnosis not present

## 2016-02-13 DIAGNOSIS — G40909 Epilepsy, unspecified, not intractable, without status epilepticus: Secondary | ICD-10-CM | POA: Diagnosis not present

## 2016-02-13 DIAGNOSIS — I110 Hypertensive heart disease with heart failure: Secondary | ICD-10-CM | POA: Diagnosis not present

## 2016-02-13 DIAGNOSIS — I5022 Chronic systolic (congestive) heart failure: Secondary | ICD-10-CM | POA: Diagnosis not present

## 2016-02-13 DIAGNOSIS — E1143 Type 2 diabetes mellitus with diabetic autonomic (poly)neuropathy: Secondary | ICD-10-CM | POA: Diagnosis not present

## 2016-02-13 DIAGNOSIS — I69352 Hemiplegia and hemiparesis following cerebral infarction affecting left dominant side: Secondary | ICD-10-CM | POA: Diagnosis not present

## 2016-02-13 DIAGNOSIS — I25110 Atherosclerotic heart disease of native coronary artery with unstable angina pectoris: Secondary | ICD-10-CM | POA: Diagnosis not present

## 2016-02-17 ENCOUNTER — Telehealth: Payer: Self-pay | Admitting: Cardiovascular Disease

## 2016-02-17 NOTE — Telephone Encounter (Signed)
New message  Pt wife call requesting to speak with RN. Pt wife would like to reschedule pt from appt with Richardson Dopp to and appt with Dr. Burt Knack. Please call back to discuss

## 2016-02-17 NOTE — Telephone Encounter (Signed)
I spoke with the pt's wife and made her aware that I can reschedule the pt to see Dr Burt Knack on 02/20/16 but he still has a follow-up for device check. The pt's daughter has taken off work to come tomorrow so at this time we will leave appointments as scheduled.

## 2016-02-18 ENCOUNTER — Telehealth: Payer: Self-pay | Admitting: *Deleted

## 2016-02-18 ENCOUNTER — Ambulatory Visit (INDEPENDENT_AMBULATORY_CARE_PROVIDER_SITE_OTHER): Payer: Medicare Other | Admitting: Physician Assistant

## 2016-02-18 ENCOUNTER — Other Ambulatory Visit: Payer: Self-pay | Admitting: Physician Assistant

## 2016-02-18 ENCOUNTER — Ambulatory Visit (INDEPENDENT_AMBULATORY_CARE_PROVIDER_SITE_OTHER): Payer: Medicare Other | Admitting: *Deleted

## 2016-02-18 ENCOUNTER — Encounter: Payer: Self-pay | Admitting: Physician Assistant

## 2016-02-18 VITALS — BP 140/58 | HR 62 | Ht 66.0 in | Wt 187.4 lb

## 2016-02-18 DIAGNOSIS — Z8679 Personal history of other diseases of the circulatory system: Secondary | ICD-10-CM | POA: Diagnosis not present

## 2016-02-18 DIAGNOSIS — I493 Ventricular premature depolarization: Secondary | ICD-10-CM

## 2016-02-18 DIAGNOSIS — E7800 Pure hypercholesterolemia, unspecified: Secondary | ICD-10-CM | POA: Diagnosis not present

## 2016-02-18 DIAGNOSIS — I10 Essential (primary) hypertension: Secondary | ICD-10-CM

## 2016-02-18 DIAGNOSIS — I5042 Chronic combined systolic (congestive) and diastolic (congestive) heart failure: Secondary | ICD-10-CM

## 2016-02-18 DIAGNOSIS — Z9889 Other specified postprocedural states: Secondary | ICD-10-CM

## 2016-02-18 DIAGNOSIS — I2510 Atherosclerotic heart disease of native coronary artery without angina pectoris: Secondary | ICD-10-CM

## 2016-02-18 DIAGNOSIS — Z8673 Personal history of transient ischemic attack (TIA), and cerebral infarction without residual deficits: Secondary | ICD-10-CM

## 2016-02-18 DIAGNOSIS — I639 Cerebral infarction, unspecified: Secondary | ICD-10-CM

## 2016-02-18 LAB — CUP PACEART INCLINIC DEVICE CHECK
MDC IDC PG IMPLANT DT: 20180108
MDC IDC SESS DTM: 20180206113432

## 2016-02-18 LAB — BASIC METABOLIC PANEL
BUN / CREAT RATIO: 18 (ref 10–24)
BUN: 27 mg/dL (ref 8–27)
CHLORIDE: 96 mmol/L (ref 96–106)
CO2: 24 mmol/L (ref 18–29)
Calcium: 9.2 mg/dL (ref 8.6–10.2)
Creatinine, Ser: 1.52 mg/dL — ABNORMAL HIGH (ref 0.76–1.27)
GFR, EST AFRICAN AMERICAN: 53 mL/min/{1.73_m2} — AB (ref >59)
GFR, EST NON AFRICAN AMERICAN: 45 mL/min/{1.73_m2} — AB (ref >59)
Glucose: 300 mg/dL — ABNORMAL HIGH (ref 65–99)
POTASSIUM: 3.8 mmol/L (ref 3.5–5.2)
SODIUM: 138 mmol/L (ref 134–144)

## 2016-02-18 LAB — MAGNESIUM: Magnesium: 2 mg/dL (ref 1.6–2.3)

## 2016-02-18 MED ORDER — NITROGLYCERIN 0.4 MG SL SUBL: 0.4000 mg | SUBLINGUAL_TABLET | SUBLINGUAL | 3 refills | Status: DC | PRN

## 2016-02-18 MED ORDER — FUROSEMIDE 40 MG PO TABS: 20.0000 mg | ORAL_TABLET | Freq: Every day | ORAL | Status: DC

## 2016-02-18 NOTE — Patient Instructions (Addendum)
Medication Instructions:  1. A REFILL FOR NTG HAS BEEN SENT IN   Labwork: 1. TODAY BMET, MAG LEVEL  Testing/Procedures: NONE  Follow-Up: SCOTT WEAVER, PAC SAME DAY DR. Burt Knack IS IN THE OFFICE IN 6-8 WEEKS   Any Other Special Instructions Will Be Listed Below (If Applicable).  If you need a refill on your cardiac medications before your next appointment, please call your pharmacy.

## 2016-02-18 NOTE — Progress Notes (Signed)
Cardiology Office Note:    Date:  02/18/2016   ID:  Carl Stephenson, DOB [birth year], MRN [id number]  PCP:  [name], MD  Cardiologist:  Dr. Sherren Mocha   Electrophysiologist:  Dr. Cristopher Peru (implanted ILR)  Referring MD: Myer Peer, MD   Chief Complaint  Patient presents with  . Hospitalization Follow-up    s/p CVA    History of Present Illness:    Carl Stephenson is a 71 y.o. male with a hx of CAD s/p multiple PCI procedures with stenting of the LAD, RI, LCx, RCA.  His last intervention was in 12/15 when he presented with [place] and underwent PCI with a DES to the LAD.  This catheterization was c/b post procedural CVA.  He has subsequently suffered multiple strokes over the last few years resulting in significant physical limitation.  Other hx includes HTN, HL, DM, bladder CA.  He was last seen by Dr. Sherren Mocha in 2/16.    He was admitted [id number] with an embolic CVA c/b seizure.  He initially presented to [facility] ED with acute encephalopathy and LBBB with marked anterior STE on ECG.  Head CT was neg for bleed.  He was tx to [facility].  Emergent LHC demonstrated patent stents in the LAD, RI, LCx, RCA.  There was mod diffuse residual disease but no culprit for the acute event.  OM1 stenosis was worse but still have TIMI 3 flow and is a small branch.  Medical Rx was recommended. LVEDP was elevated and he was diuresed.  MRI demonstrated acute infarcts in multiple territories (R brain, anterior and posterior circulation) c/w embolic CVA.  Carotid US did not show significant ICA stenosis.  TEE showed a PFO but there was no DVT on LE ultrasound.  His ejection fraction is 45-50% on echocardiogram.  His presentation was felt to be related to prolonged post ictal state.  Arrhythmia could have explained his presentation as well.  He was seen by EP and underwent ILR implantation.    He returns for post hospitalization follow up.  He is here with his wife and daughter.  He  was DC to home with HHPT.  He notes that he is doing well.  He denies chest pain, significant shortness of breath.  He denies syncope or palpitations.  He denies orthopnea, PND, edema.    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 Acute encephalopathy admitted 12/28/2013  . Bladder tumor    Recurrent  . Bradycardia 01/16/2016  . Cancer [facility])    "had TURBT; cancer free since ~ 11/2013/MD" (12/27/2013)  . Carotid artery disease ([place])    [place] 12/15: Bilateral ICA 1-39 // [place] 1/18: Bilateral ICA 1-39  . Chronic lower back pain   . Coronary  artery disease    a. Cath 12/27/2013 EF 35-40%, 80-90% prox LAD treated with balloon angioplasty and 3.0x28 mm Xience DES, LCx with anomalous origin, 50-60% stenosis in OM, 50% stenosis in PDA and PLA // b. [place] 1/18: mLAD stent ok, dLAD 80, RI stent ok, oLCx stent ok, OM1 85, oRCA stent ok, mRA 40, dRCA 40, RPDA 60/60 >> med Rx  . Diabetes mellitus type II   . GERD (gastroesophageal reflux disease)   . History of echocardiogram    Echo 12/15: Mild LVH, EF 45-50, inf HK, Gr 1 diastolic dysfunction  // Echo 1/18: Moderate LVH, EF 45-50, diffuse HK, mild LAE, RA upper limits of normal // TEE 1/18:  EF 45, diffuse HK, frequent PVCs, trivial MR, mild LAE, normal RVSF, no LA or RA clot, + PFO   . History of hiatal hernia   .  Hyperlipidemia   . Hypertension   . 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 Kidney stones    "passed them all" (12/27/2013)  . Obesity   . Seizure disorder as sequela of cerebrovascular accident ([place])   . Stroke [facility]) ~ 2012; 2018   multiple strokes // embolic CVA [id number] >> admx with encephalopathy >> s/p ILR  . TIA (transient ischemic attack) "several"  . Tobacco user    Quit 1994  . Wide-complex tachycardia ([place])     Past Surgical History:  Procedure Laterality Date  . CARDIAC CATHETERIZATION     "he's had a couple before today where they just looked around" (12/27/2013)  . CARDIAC CATHETERIZATION N/A 01/15/2016   Procedure: Left Heart Cath and Coronary Angiography;  Surgeon: Peter M Martinique, MD;  Location: [facility];  Service: Cardiovascular;  Laterality: N/A;  . CORONARY ANGIOPLASTY WITH STENT PLACEMENT     "he's had 3-4 stents put in before today"   . CORONARY ANGIOPLASTY WITH STENT PLACEMENT  12/27/2013   "1"  . EP IMPLANTABLE DEVICE N/A 01/20/2016   Procedure: Loop Recorder Insertion;  Surgeon: Evans Lance, MD;  Location: [facility];  Service: Cardiovascular;  Laterality: N/A;  . LEFT HEART CATHETERIZATION WITH CORONARY ANGIOGRAM N/A 10/02/2011   Procedure: LEFT HEART CATHETERIZATION WITH CORONARY ANGIOGRAM;  Surgeon: [name] Bow, MD;  Location: [facility] CATH LAB;  Service: Cardiovascular;  Laterality: N/A;  . LEFT HEART CATHETERIZATION WITH CORONARY ANGIOGRAM N/A 12/27/2013   Procedure: LEFT HEART CATHETERIZATION WITH CORONARY ANGIOGRAM;  Surgeon: Blane Ohara, MD;  Location: [facility] CATH LAB;  Service: Cardiovascular;  Laterality: N/A;  . TEE WITHOUT CARDIOVERSION N/A 01/20/2016   Procedure: TRANSESOPHAGEAL ECHOCARDIOGRAM (TEE);  Surgeon: Larey Dresser, MD;  Location: [facility];  Service: Cardiovascular;  Laterality: N/A;  . TRANSURETHRAL RESECTION OF BLADDER TUMOR WITH GYRUS (TURBT-GYRUS)  "several times"    Current  Medications: Current Meds  Medication Sig  . acetaminophen (TYLENOL) 325 MG tablet Take 2 tablets (650 mg total) by mouth every 4 (four) hours as needed for headache or mild pain.  [name] aspirin EC 81 MG EC tablet Take 1 tablet (81 mg total) by mouth daily.  [name] atorvastatin (LIPITOR) 80 MG tablet Take 1 tablet (80 mg total) by mouth daily at 6 PM.  . carvedilol (COREG) 3.125 MG tablet Take 1 tablet (3.125 mg total) by mouth 2 (two) times daily with a meal.  . clopidogrel (PLAVIX) 75 MG tablet Take 1 tablet (75 mg total) by mouth daily with breakfast.  . fexofenadine (ALLEGRA) 180 MG tablet Take  180 mg by mouth daily.  . furosemide (LASIX) 40 MG tablet Take 1 tablet (40 mg total) by mouth daily.  . insulin detemir (LEVEMIR) 100 UNIT/ML injection Inject 0.35 mLs (35 Units total) into the skin daily with supper.  . levETIRAcetam (KEPPRA) 500 MG tablet Take 1 tablet (500 mg total) by mouth 2 (two) times daily.  . nitroGLYCERIN (NITROSTAT) 0.4 MG SL tablet Place 1 tablet (0.4 mg total) under the tongue every 5 (five) minutes as needed for chest pain.  . pantoprazole (PROTONIX) 40 MG tablet Take 40 mg by mouth daily.    . potassium chloride SA (K-DUR,KLOR-CON) 20 MEQ tablet Take 1 tablet (20 mEq total) by mouth daily.  . sertraline (ZOLOFT) 100 MG tablet Take 100 mg by mouth daily.  . [DISCONTINUED] nitroGLYCERIN (NITROSTAT) 0.4 MG SL tablet Place 0.4 mg under the tongue every 5 (five) minutes as needed for chest pain.     Allergies:   Nitrofurantoin monohyd macro   Social History   Social History  . Marital status: Married    Spouse name: N/A  . Number of children: N/A  . Years of education: N/A   Occupational History  . 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 Smoking status: Former Smoker    Packs/day: 3.00    Years: 22.00    Types: Cigarettes    Quit date: 08/12/1992  . Smokeless tobacco: Never Used  . Alcohol use No  . Drug use: No  . Sexual  activity: Not Asked   Other Topics Concern  . None   Social History Narrative   Married    2 grown children     Family History  Problem Relation Age of Onset  . Coronary artery disease Other   . Heart failure Father   . Heart disease Father      ROS:   Please see the history of present illness.    Review of Systems  Cardiovascular: Positive for irregular heartbeat.  Hematologic/Lymphatic: Bruises/bleeds easily.  Musculoskeletal: Positive for back pain.  Neurological: Positive for loss of balance.  Psychiatric/Behavioral: Positive for depression. The patient is nervous/anxious.    All other systems reviewed and are negative.   EKGs/Labs/Other Test Reviewed:    EKG:  EKG is  ordered today.  The ekg ordered today demonstrates NSR, HR 62, PVCs with compensatory pause, inf and ant Q waves, NSSTTW changes, QTc 444 ms  Recent Labs: 01/15/2016: ALT 13 01/16/2016: B Natriuretic Peptide 446.5; Hemoglobin 12.0; Platelets 160 02/18/2016: BUN 27; Creatinine, Ser 1.52; Magnesium 2.0; Potassium 3.8; Sodium 138   Recent Lipid Panel    Component Value Date/Time   CHOL 215 (H) 01/17/2016 0416   TRIG 238 (H) 01/17/2016 0416   HDL 27 (L) 01/17/2016 0416   CHOLHDL 8.0 01/17/2016 0416   VLDL 48 (H) 01/17/2016 0416   LDLCALC 140 (H) 01/17/2016 0416     Physical Exam:    VS:  BP (!) 140/58   Pulse 62   Ht 5\' 6"  (1.676 m)   Wt 187 lb 6.4 oz (85 kg)   BMI 30.25 kg/m     Wt Readings from Last 3 Encounters:  02/18/16 187 lb 6.4 oz (85 kg)  01/21/16 186 lb 1.1 oz (84.4 kg)  02/15/14 185 lb (83.9 kg)     Physical Exam  Constitutional: He is oriented to person, place, and time. He appears well-developed and well-nourished. No distress.  HENT:  Head: Normocephalic and atraumatic.  Eyes: No scleral icterus.  Neck: No JVD present.  Cardiovascular: Normal heart sounds.  An irregular rhythm present. Bradycardia present.   No murmur heard. Pulmonary/Chest: Effort normal. He has no  wheezes. He has no rales.  Abdominal: Soft. There is no tenderness.  Musculoskeletal: He exhibits no edema.  R wrist without hematoma  Neurological: He is alert and oriented to person, place, and time.  Skin: Skin is warm and dry.  Psychiatric: He has a normal mood and affect.    ASSESSMENT:    1. Coronary artery disease involving native coronary artery of native heart without angina pectoris   2. Chronic combined systolic and diastolic CHF (congestive heart failure) ([place])   3. PVC's (premature ventricular contractions)   4. History of embolic stroke   5. Essential hypertension   6. Pure hypercholesterolemia    PLAN:    In order of problems listed above:  1. CAD - s/p multiple PCI procedures in the past.  Recent admission with LBBB and STE concerning for STEMI.  But, LHC with patent stents and stable anatomy. Presentation was felt to be related to embolic CVA and seizures.  He denies angina.  -  Continue ASA, Plavix, Lipitor, Coreg.  2. Chronic combined systolic and diastolic CHF - Volume appears stable.  He has a lot of PVCs on his ECG.    -  Continue Lasix and potassium.  -  Obtain BMET, Mg2+  3. PVCs - Asymptomatic. He was bradycardic in the hospital.  His HR is 60 today.  Continue follow up with EP for ILR monitor interrogation.  Continue beta-blocker.  He was told in the hospital to hold his Coreg for HR < 60. I asked him to hold it for 50 or lower.  4. Hx of CVA - s/p cryptogenic CVA.  He is s/p ILR.  FU with EP for ILR.  FU with neurology as planned.    5. HTN - BP controlled.   6. HL - Continue statin.   Medication Adjustments/Labs and Tests Ordered: Current medicines are reviewed at length with the patient today.  Concerns regarding medicines are outlined above.  Medication changes, Labs and Tests ordered today are outlined in the Patient Instructions noted below. Patient Instructions  Medication Instructions:  1. A REFILL FOR NTG HAS BEEN SENT IN   Labwork: 1.  TODAY BMET, MAG LEVEL  Testing/Procedures: NONE  Follow-Up: Kiylah Loyer, PAC SAME DAY DR. Burt Knack IS IN THE OFFICE IN 6-8 WEEKS   Any Other Special Instructions Will Be Listed Below (If Applicable).  If you need a refill on your cardiac medications before your next appointment, please call your pharmacy.   Return in about 6 weeks (around 03/31/2016) for Close Follow Up, w/ Richardson Dopp, PA-C, w/ Dr. Burt Knack.   Signed, Richardson Dopp, PA-C  02/18/2016 4:48 PM    [facility] [address], [place], [place]  [address] Phone: [phone]; Fax: [phone]

## 2016-02-18 NOTE — Telephone Encounter (Signed)
Pt gave permission ok to s/w his wife about test results, appts etc. Wife aware of results and findings. Went over medication dose change for pt's lasix; decrease lasix to 20 mg daily; he can take 1/2 tablet of the 40 mg. BMET will be done 2/15 when he sees his PCP in [place]. I will send an Rx for lab work to be done for our office with the results to be faxed to [facility]. Pt aware to weigh daily and call the office if weight is up 3 lb's or more x 1 day or increased sob, or edema.

## 2016-02-18 NOTE — Progress Notes (Signed)
Wound check appointment. Steri-strips off. Wound without redness or edema. Incision edges approximated, wound well healed. Loop check in clinic. Battery status: good R-waves 0.67mV. 0 symptom episodes, 0 tachy episodes, 0 pause episodes, 0 brady episodes. 0 AF episodes . Monthly summary reports and ROV with GT PRN

## 2016-02-19 ENCOUNTER — Ambulatory Visit (INDEPENDENT_AMBULATORY_CARE_PROVIDER_SITE_OTHER): Payer: Medicare Other | Admitting: *Deleted

## 2016-02-19 DIAGNOSIS — I638 Other cerebral infarction: Secondary | ICD-10-CM

## 2016-02-19 DIAGNOSIS — I69352 Hemiplegia and hemiparesis following cerebral infarction affecting left dominant side: Secondary | ICD-10-CM | POA: Diagnosis not present

## 2016-02-19 DIAGNOSIS — I110 Hypertensive heart disease with heart failure: Secondary | ICD-10-CM | POA: Diagnosis not present

## 2016-02-19 DIAGNOSIS — I6389 Other cerebral infarction: Secondary | ICD-10-CM

## 2016-02-19 DIAGNOSIS — I5022 Chronic systolic (congestive) heart failure: Secondary | ICD-10-CM | POA: Diagnosis not present

## 2016-02-19 DIAGNOSIS — E1143 Type 2 diabetes mellitus with diabetic autonomic (poly)neuropathy: Secondary | ICD-10-CM | POA: Diagnosis not present

## 2016-02-19 DIAGNOSIS — I25110 Atherosclerotic heart disease of native coronary artery with unstable angina pectoris: Secondary | ICD-10-CM | POA: Diagnosis not present

## 2016-02-19 DIAGNOSIS — G40909 Epilepsy, unspecified, not intractable, without status epilepticus: Secondary | ICD-10-CM | POA: Diagnosis not present

## 2016-02-20 DIAGNOSIS — I25110 Atherosclerotic heart disease of native coronary artery with unstable angina pectoris: Secondary | ICD-10-CM | POA: Diagnosis not present

## 2016-02-20 DIAGNOSIS — I5022 Chronic systolic (congestive) heart failure: Secondary | ICD-10-CM | POA: Diagnosis not present

## 2016-02-20 DIAGNOSIS — I69352 Hemiplegia and hemiparesis following cerebral infarction affecting left dominant side: Secondary | ICD-10-CM | POA: Diagnosis not present

## 2016-02-20 DIAGNOSIS — I110 Hypertensive heart disease with heart failure: Secondary | ICD-10-CM | POA: Diagnosis not present

## 2016-02-20 DIAGNOSIS — G40909 Epilepsy, unspecified, not intractable, without status epilepticus: Secondary | ICD-10-CM | POA: Diagnosis not present

## 2016-02-20 DIAGNOSIS — E1143 Type 2 diabetes mellitus with diabetic autonomic (poly)neuropathy: Secondary | ICD-10-CM | POA: Diagnosis not present

## 2016-02-20 NOTE — Progress Notes (Signed)
Carelink Summary Report / Loop Recorder 

## 2016-02-27 DIAGNOSIS — E1165 Type 2 diabetes mellitus with hyperglycemia: Secondary | ICD-10-CM | POA: Diagnosis not present

## 2016-02-27 DIAGNOSIS — Z760 Encounter for issue of repeat prescription: Secondary | ICD-10-CM | POA: Diagnosis not present

## 2016-03-09 ENCOUNTER — Ambulatory Visit (INDEPENDENT_AMBULATORY_CARE_PROVIDER_SITE_OTHER): Payer: Medicare Other | Admitting: Neurology

## 2016-03-09 ENCOUNTER — Encounter: Payer: Self-pay | Admitting: Neurology

## 2016-03-09 VITALS — BP 136/61 | HR 54 | Ht 66.0 in | Wt 192.4 lb

## 2016-03-09 DIAGNOSIS — I639 Cerebral infarction, unspecified: Secondary | ICD-10-CM

## 2016-03-09 DIAGNOSIS — I6310 Cerebral infarction due to embolism of unspecified precerebral artery: Secondary | ICD-10-CM

## 2016-03-09 NOTE — Patient Instructions (Signed)
I had a long d/w patient about his recent stroke, post stroke seizure risk for recurrent stroke/TIAs, personally independently reviewed imaging studies and stroke evaluation results and answered questions.Continue aspirin 81 mg daily and clopidogrel 75 mg daily  given recent cardiac stent for secondary stroke prevention and maintain strict control of hypertension with blood pressure goal below 130/90, diabetes with hemoglobin A1c goal below 6.5% and lipids with LDL cholesterol goal below 70 mg/dL. Continue Keppra for seizure prophylaxis and do not drive for 6 months as per [facility] I also advised the patient to eat a healthy diet with plenty of whole grains, cereals, fruits and vegetables, exercise regularly and maintain ideal body weight Followup in the future with my nurse practitioner in 6 months or call earlier if necessary  Stroke Prevention Some medical conditions and behaviors are associated with an increased chance of having a stroke. You may prevent a stroke by making healthy choices and managing medical conditions. How can I reduce my risk of having a stroke?  Stay physically active. Get at least 30 minutes of activity on most or all days.  Do not smoke. It may also be helpful to avoid exposure to secondhand smoke.  Limit alcohol use. Moderate alcohol use is considered to be:  No more than 2 drinks per day for men.  No more than 1 drink per day for nonpregnant women.  Eat healthy foods. This involves:  Eating 5 or more servings of fruits and vegetables a day.  Making dietary changes that address high blood pressure (hypertension), high cholesterol, diabetes, or obesity.  Manage your cholesterol levels.  Making food choices that are high in fiber and low in saturated fat, trans fat, and cholesterol may control cholesterol levels.  Take any prescribed medicines to control cholesterol as directed by your health care provider.  Manage your diabetes.  Controlling your  carbohydrate and sugar intake is recommended to manage diabetes.  Take any prescribed medicines to control diabetes as directed by your health care provider.  Control your hypertension.  Making food choices that are low in salt (sodium), saturated fat, trans fat, and cholesterol is recommended to manage hypertension.  Ask your health care provider if you need treatment to lower your blood pressure. Take any prescribed medicines to control hypertension as directed by your health care provider.  If you are 54-3 years of age, have your blood pressure checked every 3-5 years. If you are 36 years of age or older, have your blood pressure checked every year.  Maintain a healthy weight.  Reducing calorie intake and making food choices that are low in sodium, saturated fat, trans fat, and cholesterol are recommended to manage weight.  Stop drug abuse.  Avoid taking birth control pills.  Talk to your health care provider about the risks of taking birth control pills if you are over 36 years old, smoke, get migraines, or have ever had a blood clot.  Get evaluated for sleep disorders (sleep apnea).  Talk to your health care provider about getting a sleep evaluation if you snore a lot or have excessive sleepiness.  Take medicines only as directed by your health care provider.  For some people, aspirin or blood thinners (anticoagulants) are helpful in reducing the risk of forming abnormal blood clots that can lead to stroke. If you have the irregular heart rhythm of atrial fibrillation, you should be on a blood thinner unless there is a good reason you cannot take them.  Understand all your medicine instructions.  Make sure that other conditions (such as anemia or atherosclerosis) are addressed. Get help right away if:  You have sudden weakness or numbness of the face, arm, or leg, especially on one side of the body.  Your face or eyelid droops to one side.  You have sudden  confusion.  You have trouble speaking (aphasia) or understanding.  You have sudden trouble seeing in one or both eyes.  You have sudden trouble walking.  You have dizziness.  You have a loss of balance or coordination.  You have a sudden, severe headache with no known cause.  You have new chest pain or an irregular heartbeat. Any of these symptoms may represent a serious problem that is an emergency. Do not wait to see if the symptoms will go away. Get medical help at once. Call your local emergency services (911 in [place] Do not drive yourself to the hospital.  This information is not intended to replace advice given to you by your health care provider. Make sure you discuss any questions you have with your health care provider. Document Released: 02/06/2004 Document Revised: 06/06/2015 Document Reviewed: 07/01/2012 Elsevier Interactive Patient Education  2017 [employer].

## 2016-03-09 NOTE — Progress Notes (Signed)
[facility] [address] [place]. [place] [address] [phone]       OFFICE FOLLOW-UP NOTE  Carl. Carl Stephenson Date of Birth:  July 02, 1945 Medical Record Number:  [id number]   HPI: Carl Stephenson is a 71 year caucasian male seen today for the first office follow-up visit following hospital admission for seizure and stroke in January 2018. He is accompanied by his wife and daughter. I have personally reviewed the hospital admission summary, imaging studies and evaluation.Carl Stephenson an 71 y.o.malewith no past medical history of seizure, head trauma, or febrile seizure. 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 Family and wife, patient had gotten up to go to the bathroom when suddenly he was noticed to fall down with both arms extended and legs extended, eyes rolling back, foaming at the mouth and bleeding at the mouth. Per wife this lasted for approximately 15 minutes and then patient was postictal and confused afterwards. EMS was called and patient was brought to the ED. On arrival to [facility] was taken to the cardiac cath lab which showed two-vessel obstructive CAD with diffuse mild LAD disease and diffuse disease in the first OM, multiple stents patent, and moderately elevated LVEDP.After discussing with family patient it was noted that  did not smoke, patient does not drink, patient did not drink on New Year's Eve, patient does not do any illicit drugs. Patient does have issues with fluid behind the eyes and now having some disconjugate gaze twitches was to see a neurologist in the future for an MRI. As stated patient has never had a seizure in the past. Currently patient is alert, he is able to come ease at the hospital but does not have any recollection of what  happened. He is having some difficulty following commands secondary to postictal state. EEG was obtained which did not show any overt seizure activity however formal reading is pending . Patient  only had 1 seizure .Marland KitchenPatient was not considered for IV t-PA secondary to non-stroke presentation.  MRI scan of the brain showed 4 small punctate infarcts in the right MCA, right PCN right cerebellar territories. MRI of the brain showed posterior separation atherosclerotic moderate changes involving distal right vertebral artery only. Transthoracic echo showed normal ejection fraction with slight diminished 45-50% ejection and diffuse hypokinesis. EEG showed moderate diffuse slowing and focal slowingwith additional focal slowing over the right hemispheire. LDL cholesterol was elevated at 140. Hemoglobin A1c was 7.7. Patient was started on Keppra 500 twice daily. He states his drinking while he has had no recurrent seizures or stroke or TIA symptoms. His meds are full physical recovery but does feel tired and sleepy a lot. He is currently on Keppra 500 twice daily. The family is concerned and wonders if he needs to stay on it. His blood pressure is well controlled today it is 136/61. His fasting sugars yet ranging in the 140 range. Patient has been eating healthy he states his activity has not had any weight loss yet. He is on aspirin and Plavix and tolerating it well with only minor bruising and no bleeding. Patient had not been driving but is interested in driving soon.  ROS:   14 system review of systems is positive for  fatigue, tiredness, allergies, weakness, depression,  tumors sleep, decreased energy and all other systems negative  PMH:  Past Medical History:  Diagnosis Date  . Acute encephalopathy admitted 12/28/2013  . Bladder tumor    Recurrent  . Bradycardia 01/16/2016  . Cancer [facility])    "had TURBT; cancer free since ~ 11/2013/MD" (12/27/2013)  . Carotid artery disease ([place])    [place] 12/15: Bilateral  ICA 1-39 // [place] 1/18: Bilateral ICA 1-39  . Chronic lower back pain   . Coronary artery disease    a. Cath 12/27/2013 EF 35-40%, 80-90% prox LAD treated with balloon angioplasty and 3.0x28 mm Xience DES, LCx with anomalous origin, 50-60% stenosis in OM, 50% stenosis in PDA and PLA // b. [place] 1/18: mLAD stent ok, dLAD 80, RI stent ok, oLCx stent ok, OM1 85, oRCA stent ok, mRA 40, dRCA 40, RPDA 60/60 >> med Rx  . Diabetes mellitus type II   . GERD (gastroesophageal reflux disease)   . History of echocardiogram    Echo 12/15: Mild LVH, EF 45-50, inf HK, Gr 1 diastolic dysfunction  // Echo 1/18: Moderate LVH, EF 45-50, diffuse HK, mild LAE, RA upper limits of normal // TEE 1/18:  EF 45, diffuse HK, frequent PVCs, trivial Carl, mild LAE, normal RVSF, no LA or RA clot, + PFO   . History of hiatal hernia   . Hyperlipidemia   . Hypertension   . 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 Kidney stones    "passed them all" (12/27/2013)  . Obesity   . Seizure disorder as sequela of cerebrovascular accident ([place])   . Stroke [facility]) ~ 2012; 2018   multiple strokes // embolic CVA [id number] >> admx with encephalopathy >> s/p ILR  . TIA (transient ischemic attack) "several"  . Tobacco user    Quit 1994  . Wide-complex tachycardia ([place])     Social History:  Social History   Social History  . Marital status: Married    Spouse name: N/A  . Number of children: N/A  . Years of education: N/A   Occupational History  . 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 Smoking status: Former Smoker    Packs/day: 3.00    Years: 22.00    Types: Cigarettes    Quit date: 08/12/1992  . Smokeless tobacco: Never Used  . Alcohol use No  . Drug use: No  . Sexual activity: Not on file   Other Topics Concern  . Not on file   Social History Narrative   Married    2 grown children    Medications:   Current Outpatient Prescriptions on File  Prior to Visit  Medication Sig Dispense Refill  . acetaminophen (TYLENOL) 325 MG tablet Take 2 tablets (650 mg total) by mouth every 4 (four) hours as needed for headache or mild pain. 30 tablet 0  . aspirin EC 81 MG EC tablet Take 1 tablet (81 mg total) by mouth daily. 30 tablet 3  . atorvastatin (LIPITOR) 80 MG tablet Take 1 tablet (80 mg total) by mouth daily at 6 PM. 30 tablet 3  . carvedilol (COREG) 3.125 MG tablet Take 1 tablet (3.125 mg total) by mouth 2 (two) times daily with a meal. 30 tablet 3  . clopidogrel (PLAVIX) 75 MG tablet Take 1 tablet (75 mg total) by mouth daily with breakfast. 30 tablet 3  . fexofenadine (ALLEGRA) 180 MG tablet Take 180 mg by mouth daily.    [name]  furosemide (LASIX) 40 MG tablet Take 0.5 tablets (20 mg total) by mouth daily.    . insulin detemir (LEVEMIR) 100 UNIT/ML injection Inject 0.35 mLs (35 Units total) into the skin daily with supper. (Patient taking differently: Inject 70 Units into the skin daily with supper. ) 10 mL 11  . levETIRAcetam (KEPPRA) 500 MG tablet Take 1 tablet (500 mg total) by mouth 2 (two) times daily. 60 tablet 0  . nitroGLYCERIN (NITROSTAT) 0.4 MG SL tablet PLACE 1 TABLET UNDER THE TONGUE EVERY 5 MINUTES AS NEEDED FOR CHEST PAIN 75 tablet 0  . pantoprazole (PROTONIX) 40 MG tablet Take 40 mg by mouth daily.      . potassium chloride SA (K-DUR,KLOR-CON) 20 MEQ tablet Take 1 tablet (20 mEq total) by mouth daily. 30 tablet 0  . sertraline (ZOLOFT) 100 MG tablet Take 100 mg by mouth daily.     Current Facility-Administered Medications on File Prior to Visit  Medication Dose Route Frequency Provider Last Rate Last Dose  . heparin infusion 2 units/mL in 0.9 % sodium chloride    Continuous PRN Peter M Martinique, MD   1,000 mL at 01/15/16 0514  . iopamidol (ISOVUE-370) 76 % injection    PRN Peter M Martinique, MD   105 mL at 01/15/16 0514  . lidocaine (PF) (XYLOCAINE) 1 % injection    PRN Peter M Martinique, MD   2 mL at 01/15/16 0514  . Radial  Cocktail/Verapamil only    PRN Peter M Martinique, MD   10 mL at 01/15/16 0451    Allergies:   Allergies  Allergen Reactions  . Nitrofurantoin Monohyd Macro Hives and Rash    Physical Exam General: Mildly obese elderly Caucasian male, seated, in no evident distress Head: head normocephalic and atraumatic.  Neck: supple with no carotid or supraclavicular bruits Cardiovascular: regular rate and rhythm, no murmurs Musculoskeletal: no deformity Skin:  no rash/petichiae Vascular:  Normal pulses all extremities Vitals:   03/09/16 1320  BP: 136/61  Pulse: (!) 54   Neurologic Exam Mental Status: Awake and fully alert. Oriented to place and time. Recent and remote memory intact. Attention span, concentration and fund of knowledge appropriate. Mood and affect appropriate.  Cranial Nerves: Fundoscopic exam reveals sharp disc margins. Pupils equal, briskly reactive to light. Extraocular movements full without nystagmus. Visual fields full to confrontation. Hearing intact. Facial sensation intact.Mild left lower facial asymmetry when he smiles., Tongue, palate moves normally and symmetrically.  Motor: Normal bulk and tone. Normal strength in all tested extremity muscles. Sensory.: intact to touch ,pinprick .position and vibratory sensation.  Coordination: Rapid alternating movements normal in all extremities. Finger-to-nose and heel-to-shin performed accurately bilaterally. Gait and Station: Arises from chair without difficulty. Stance is normal. Gait demonstrates normal stride length and balance . Unable to heel, toe and tandem walk without difficulty.  Reflexes: 1+ and symmetric. Toes downgoing.   NIHSS  1 Modified [place]  2  ASSESSMENT: 72 year Caucasian male with small right anterior and posterior circulation embolic infarcts in January 2018 with symptomatic generalized seizure. The strokes occurred after cardiac catheterization procedure    PLAN: I had a long d/w patient, wife and daughter  about his recent stroke, post stroke seizure risk for recurrent stroke/TIAs, personally independently reviewed imaging studies and stroke evaluation results and answered questions.Continue aspirin 81 mg daily and clopidogrel 75 mg daily  given recent cardiac stent for secondary stroke prevention and maintain strict control of hypertension with blood pressure goal below 130/90, diabetes with hemoglobin  A1c goal below 6.5% and lipids with LDL cholesterol goal below 70 mg/dL. Continue Keppra for seizure prophylaxis and do not drive for 6 months as per [facility] I also advised the patient to eat a healthy diet with plenty of whole grains, cereals, fruits and vegetables, exercise regularly and maintain ideal body weight Followup in the future with my nurse practitioner in 6 months or call earlier if necessary Greater than 50% of time during this 35 minute visit was spent on counseling,explanation of diagnosis, planning of further management, discussion with patient and family and coordination of care Carl Contras, MD  [facility] [address] [place], [place] [address]  Phone [phone] Fax [phone] Note: This document was prepared with digital dictation and possible smart phrase technology. Any transcriptional errors that result from this process are unintentional

## 2016-03-13 ENCOUNTER — Telehealth: Payer: Self-pay

## 2016-03-13 NOTE — Telephone Encounter (Signed)
Called for Carl Stephenson , and his wife answered . Wanted to let her know that his diabetic paperwork was cancelled and if he wanted to look into getting diabetic shoes we could start new paperwork when he comes in at his next visit.

## 2016-03-15 LAB — CUP PACEART REMOTE DEVICE CHECK
MDC IDC PG IMPLANT DT: 20180108
MDC IDC SESS DTM: 20180207173722

## 2016-03-15 NOTE — Progress Notes (Signed)
Carelink summary report received. Battery status OK. Normal device function. No new symptom episodes, tachy episodes, brady, or pause episodes. No new AF episodes. Monthly summary reports and ROV/PRN 

## 2016-03-20 ENCOUNTER — Ambulatory Visit (INDEPENDENT_AMBULATORY_CARE_PROVIDER_SITE_OTHER): Payer: Medicare Other | Admitting: *Deleted

## 2016-03-20 DIAGNOSIS — I6389 Other cerebral infarction: Secondary | ICD-10-CM

## 2016-03-20 DIAGNOSIS — I638 Other cerebral infarction: Secondary | ICD-10-CM | POA: Diagnosis not present

## 2016-03-20 NOTE — Progress Notes (Signed)
Carelink Summary Report / Loop Recorder 

## 2016-03-23 ENCOUNTER — Telehealth: Payer: Self-pay | Admitting: *Deleted

## 2016-03-23 NOTE — Telephone Encounter (Signed)
I called the pt to see about rescheduling his appt on 04/24/16 with Brynda Rim. PA, who will be out of town that day. I Lmtcb.

## 2016-03-24 NOTE — Telephone Encounter (Signed)
I tried to reach pt again today to reschedule 04/24/16 appt with Richardson Dopp, PA who will be out of the office that day. I lmtcb yesterday with no call back from pt at this time. I called today as well with no answer. I did call his daughter Abigail Butts, [place] on file. Explained everything was fine, that I was just trying to reach pt so that I may reschedule his appt. Abigail Butts thanked me for the call and said she will have ptcb. I thanked her for her time.

## 2016-03-26 ENCOUNTER — Ambulatory Visit: Payer: Medicare Other | Admitting: Sports Medicine

## 2016-04-01 LAB — CUP PACEART REMOTE DEVICE CHECK
Date Time Interrogation Session: 20180309174826
MDC IDC PG IMPLANT DT: 20180108

## 2016-04-06 ENCOUNTER — Telehealth: Payer: Self-pay | Admitting: Neurology

## 2016-04-06 ENCOUNTER — Telehealth: Payer: Self-pay | Admitting: Cardiovascular Disease

## 2016-04-06 NOTE — Telephone Encounter (Signed)
Yes stop lipitor

## 2016-04-06 NOTE — Telephone Encounter (Signed)
New message  Pt c/o medication issue:  1. Name of Medication: lipitor   2. How are you currently taking this medication (dosage and times per day)? 80mg    3. Are you having a reaction (difficulty breathing--STAT)? Per pt wife the medication is not agreeing with pt.  4. What is your medication issue? Per pt wife states she feels pt needs to be taken off the medication. Please call back to discuss

## 2016-04-06 NOTE — Telephone Encounter (Signed)
Agree. Richardson Dopp, PA-C   04/06/2016 10:04 PM

## 2016-04-06 NOTE — Telephone Encounter (Signed)
Patient's daughter called Abigail Butts and relayed , [name] wanted patient to call the office if he and problems with weakness with Lipitor.  Patient is having weakness in arms and Legs Patient has had weakness for two weeks off and on. . Please advise if he she stop RX , Please call Abigail Butts with directions  [phone].  atorvastatin (LIPITOR) 80 MG tablet Take 1 tablet (80 mg total) by mouth daily at 6 PM.

## 2016-04-06 NOTE — Telephone Encounter (Signed)
Agree hold atorvastatin until FU appt in May. If symptoms don't improve with a few days please call back and will check some labs.

## 2016-04-06 NOTE — Telephone Encounter (Signed)
Pt's wife states pt has had increase in weakness in his legs, having a hard time getting up since starting back on lipitor. Pt's wife states pt had been on lipitor a couple of years ago and had to stop taking it because of similar side effects. Pt wife states pt had been on gemfibrozil in the past but does not recall any other cholesterol medication. Pt's wife advised to hold lipitor for now, I will forward to Scott/ Dr Burt Knack for review.

## 2016-04-07 NOTE — Telephone Encounter (Signed)
Rn call Abigail Butts pts daughter on dpr form. Rn stated per Dr.Sethi to stop taking lipitor medication.  Pt having weakness in legs and arms.Abigail Butts stated did they suggest anything in place of the statin. Rn stated she can call PCP to see what they recommend like a statin. Rn advised Abigail Butts to call our office for any other issues.

## 2016-04-07 NOTE — Telephone Encounter (Signed)
Discussed with pt's wife, she verbalized understanding.

## 2016-04-08 DIAGNOSIS — C44329 Squamous cell carcinoma of skin of other parts of face: Secondary | ICD-10-CM | POA: Diagnosis not present

## 2016-04-08 DIAGNOSIS — L570 Actinic keratosis: Secondary | ICD-10-CM | POA: Diagnosis not present

## 2016-04-08 DIAGNOSIS — L578 Other skin changes due to chronic exposure to nonionizing radiation: Secondary | ICD-10-CM | POA: Diagnosis not present

## 2016-04-08 DIAGNOSIS — L821 Other seborrheic keratosis: Secondary | ICD-10-CM | POA: Diagnosis not present

## 2016-04-13 ENCOUNTER — Telehealth: Payer: Self-pay | Admitting: Physician Assistant

## 2016-04-13 MED ORDER — CARVEDILOL 3.125 MG PO TABS: 3.1250 mg | ORAL_TABLET | Freq: Two times a day (BID) | ORAL | 2 refills | Status: DC

## 2016-04-13 NOTE — Telephone Encounter (Signed)
New Message      *STAT* If patient is at the pharmacy, call can be transferred to refill team.   1. Which medications need to be refilled? (please list name of each medication and dose if known)  carvedilol (COREG) 3.125 MG tablet Take 1 tablet (3.125 mg total) by mouth 2 (two) times daily with a meal.     2. Which pharmacy/location (including street and city if local pharmacy) is medication to be sent to? [facility] in [place]  3. Do they need a 30 day or 90 day supply? [place]

## 2016-04-17 DIAGNOSIS — D0439 Carcinoma in situ of skin of other parts of face: Secondary | ICD-10-CM | POA: Diagnosis not present

## 2016-04-20 ENCOUNTER — Ambulatory Visit (INDEPENDENT_AMBULATORY_CARE_PROVIDER_SITE_OTHER): Payer: Medicare Other | Admitting: *Deleted

## 2016-04-20 DIAGNOSIS — I6389 Other cerebral infarction: Secondary | ICD-10-CM

## 2016-04-20 DIAGNOSIS — I638 Other cerebral infarction: Secondary | ICD-10-CM | POA: Diagnosis not present

## 2016-04-21 NOTE — Progress Notes (Signed)
Carelink Summary Report / Loop Recorder 

## 2016-04-24 ENCOUNTER — Ambulatory Visit: Payer: Medicare Other | Admitting: Physician Assistant

## 2016-04-27 ENCOUNTER — Ambulatory Visit: Payer: Medicare Other | Admitting: Physician Assistant

## 2016-05-01 LAB — CUP PACEART REMOTE DEVICE CHECK
Date Time Interrogation Session: 20180408180830
Implantable Pulse Generator Implant Date: 20180108

## 2016-05-06 DIAGNOSIS — L570 Actinic keratosis: Secondary | ICD-10-CM | POA: Diagnosis not present

## 2016-05-19 ENCOUNTER — Ambulatory Visit (INDEPENDENT_AMBULATORY_CARE_PROVIDER_SITE_OTHER): Payer: Medicare Other | Admitting: *Deleted

## 2016-05-19 DIAGNOSIS — I638 Other cerebral infarction: Secondary | ICD-10-CM

## 2016-05-19 DIAGNOSIS — I6389 Other cerebral infarction: Secondary | ICD-10-CM

## 2016-05-19 NOTE — Progress Notes (Signed)
Carelink Summary Report / Loop Recorder 

## 2016-05-20 ENCOUNTER — Encounter: Payer: Self-pay | Admitting: Cardiovascular Disease

## 2016-05-29 LAB — CUP PACEART REMOTE DEVICE CHECK
Date Time Interrogation Session: 20180508184110
Implantable Pulse Generator Implant Date: 20180108

## 2016-06-01 ENCOUNTER — Encounter: Payer: Self-pay | Admitting: Internal Medicine

## 2016-06-01 ENCOUNTER — Telehealth: Payer: Self-pay | Admitting: *Deleted

## 2016-06-01 NOTE — Telephone Encounter (Signed)
Spoke with patient and patient's wife regarding receiving manual transmission. Full report reviewed, 12 brady episodes, longest lasting 17 seconds, minimum rate 39 bpm. Patient states no symptoms, including syncope or presyncopal. Educated patient on symptoms of syncope and presyncope and to call if symptoms occur. Patient verbalized understanding. Brady episodes placed in Dr. Tanna Furry folder for review.

## 2016-06-01 NOTE — Telephone Encounter (Signed)
LMOVM regarding sending a manual transmission to review Full report. [place] [facility] phone number to call back for assistance or further questions.

## 2016-06-11 ENCOUNTER — Encounter: Payer: Self-pay | Admitting: Cardiovascular Disease

## 2016-06-11 ENCOUNTER — Encounter (INDEPENDENT_AMBULATORY_CARE_PROVIDER_SITE_OTHER): Payer: Self-pay

## 2016-06-11 ENCOUNTER — Ambulatory Visit (INDEPENDENT_AMBULATORY_CARE_PROVIDER_SITE_OTHER): Payer: Medicare Other | Admitting: Cardiovascular Disease

## 2016-06-11 VITALS — BP 124/68 | HR 53 | Ht 67.0 in | Wt 189.8 lb

## 2016-06-11 DIAGNOSIS — E7800 Pure hypercholesterolemia, unspecified: Secondary | ICD-10-CM | POA: Diagnosis not present

## 2016-06-11 DIAGNOSIS — I10 Essential (primary) hypertension: Secondary | ICD-10-CM

## 2016-06-11 DIAGNOSIS — I5042 Chronic combined systolic (congestive) and diastolic (congestive) heart failure: Secondary | ICD-10-CM | POA: Diagnosis not present

## 2016-06-11 DIAGNOSIS — I639 Cerebral infarction, unspecified: Secondary | ICD-10-CM

## 2016-06-11 MED ORDER — CLOPIDOGREL BISULFATE 75 MG PO TABS: 75.0000 mg | ORAL_TABLET | Freq: Every day | ORAL | 3 refills | Status: DC

## 2016-06-11 MED ORDER — POTASSIUM CHLORIDE CRYS ER 20 MEQ PO TBCR: 20.0000 meq | EXTENDED_RELEASE_TABLET | Freq: Every day | ORAL | 3 refills | Status: AC

## 2016-06-11 MED ORDER — FUROSEMIDE 40 MG PO TABS: 20.0000 mg | ORAL_TABLET | Freq: Every day | ORAL | 3 refills | Status: DC

## 2016-06-11 NOTE — Patient Instructions (Signed)
Medication Instructions:  Your physician recommends that you continue on your current medications as directed. Please refer to the Current Medication list given to you today.  Remain off of Atorvastatin (Lipitor)  Labwork: Your physician recommends that you return for a FASTING LIPID and LIVER in 1 MONTH--nothing to eat or drink after midnight, lab opens at 7:30 AM  Testing/Procedures: No new orders.   Follow-Up: Your physician recommends that you schedule a follow-up appointment in: 6 MONTHS with Richardson Dopp PA-C  Your physician wants you to follow-up in: 1 YEAR with Dr Burt Knack. You will receive a reminder letter in the mail two months in advance. If you don't receive a letter, please call our office to schedule the follow-up appointment.    Any Other Special Instructions Will Be Listed Below (If Applicable).     If you need a refill on your cardiac medications before your next appointment, please call your pharmacy.

## 2016-06-11 NOTE — Progress Notes (Signed)
Cardiology Office Note Date:  06/11/2016   ID:  Carl Stephenson, DOB Jun 22, 1945, MRN [id number]  PCP:  Myer Peer, MD  Cardiologist:  Sherren Mocha, MD    Chief Complaint  Patient presents with  . Coronary Artery Disease     History of Present Illness: Carl Stephenson is a 71 y.o. male who presents for follow-up of coronary artery disease with history of multiple PCI procedures, last in 2015. The patient has had multiple strokes in the past. Other medical problems include hypertension, hyperlipidemia, diabetes. He was last hospitalized in January of this year with an embolic stroke complicated with seizure. He underwent cardiac catheterization emergently because of marked anterior ST elevation on his EKG. This demonstrated patent stent sites with no clear culprit for an acute event. Medical therapy was recommended. LVEF is 45-50%. He underwent implantable loop monitoring.  The patient is here with his wife and daughter today. He has been having more problems with his left leg and they relate this to atorvastatin. He has weakness and some aching. Since he has stopped atorvastatin his symptoms have improved. He's had problems with this in the past as well. He's had no recent chest pain, orthopnea, PND, or leg swelling. No shortness of breath. He is sedentary. He denies lightheadedness or syncope.  Past Medical History:  Diagnosis Date  . Acute encephalopathy admitted 12/28/2013  . Bladder tumor    Recurrent  . Bradycardia 01/16/2016  . Cancer [facility])    "had TURBT; cancer free since ~ 11/2013/MD" (12/27/2013)  . Carotid artery disease ([place])    [place] 12/15: Bilateral ICA 1-39 // [place] 1/18: Bilateral ICA 1-39  . Chronic lower back pain   . Coronary artery disease    a. Cath 12/27/2013 EF 35-40%, 80-90% prox LAD treated with balloon angioplasty and 3.0x28 mm Xience DES, LCx with anomalous origin, 50-60% stenosis in OM, 50% stenosis in PDA and PLA // b. [place] 1/18: mLAD stent ok, dLAD 80,  RI stent ok, oLCx stent ok, OM1 85, oRCA stent ok, mRA 40, dRCA 40, RPDA 60/60 >> med Rx  . Diabetes mellitus type II   . GERD (gastroesophageal reflux disease)   . History of echocardiogram    Echo 12/15: Mild LVH, EF 45-50, inf HK, Gr 1 diastolic dysfunction  // Echo 1/18: Moderate LVH, EF 45-50, diffuse HK, mild LAE, RA upper limits of normal // TEE 1/18:  EF 45, diffuse HK, frequent PVCs, trivial MR, mild LAE, normal RVSF, no LA or RA clot, + PFO   . History of hiatal hernia   . Hyperlipidemia   . Hypertension   . 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 Kidney stones    "passed them all" (12/27/2013)  . Obesity   . Seizure disorder as sequela of cerebrovascular accident ([place])   . Stroke [facility]) ~ 2012; 2018   multiple strokes // embolic CVA 01/5398 >> admx with encephalopathy >> s/p ILR  . TIA (transient ischemic attack) "several"  . Tobacco user    Quit 1994  . Wide-complex tachycardia ([place])     Past Surgical History:  Procedure Laterality Date  . CARDIAC CATHETERIZATION     "he's had a couple before today where they just looked around" (12/27/2013)  . CARDIAC CATHETERIZATION N/A 01/15/2016   Procedure: Left Heart Cath and Coronary Angiography;  Surgeon: Peter M Martinique, MD;  Location: [place] [facility];  Service: Cardiovascular;  Laterality: N/A;  . CORONARY ANGIOPLASTY  WITH STENT PLACEMENT     "he's had 3-4 stents put in before today"   . CORONARY ANGIOPLASTY WITH STENT PLACEMENT  12/27/2013   "1"  . EP IMPLANTABLE DEVICE N/A 01/20/2016   Procedure: Loop Recorder Insertion;  Surgeon: Evans Lance, MD;  Location: [place] [facility];  Service: Cardiovascular;  Laterality: N/A;  . LEFT HEART CATHETERIZATION WITH CORONARY ANGIOGRAM N/A 10/02/2011   Procedure: LEFT HEART CATHETERIZATION WITH CORONARY ANGIOGRAM;  Surgeon: [name] Bow, MD;  Location[facility] CATH [facility];  Service: Cardiovascular;  Laterality: N/A;  . LEFT HEART CATHETERIZATION WITH  CORONARY ANGIOGRAM N/A 12/27/2013   Procedure: LEFT HEART CATHETERIZATION WITH CORONARY ANGIOGRAM;  Surgeon: Blane Ohara, MD;  Location: [facility] CATH LAB;  Service: Cardiovascular;  Laterality: N/A;  . TEE WITHOUT CARDIOVERSION N/A 01/20/2016   Procedure: TRANSESOPHAGEAL ECHOCARDIOGRAM (TEE);  Surgeon: Larey Dresser, MD;  Location: [place];  Service: Cardiovascular;  Laterality: N/A;  . TRANSURETHRAL RESECTION OF BLADDER TUMOR WITH GYRUS (TURBT-GYRUS)  "several times"    Current Outpatient Prescriptions  Medication Sig Dispense Refill  . acetaminophen (TYLENOL) 325 MG tablet Take 2 tablets (650 mg total) by mouth every 4 (four) hours as needed for headache or mild pain. 30 tablet 0  . aspirin EC 81 MG EC tablet Take 1 tablet (81 mg total) by mouth daily. 30 tablet 3  . carvedilol (COREG) 3.125 MG tablet Take 1 tablet (3.125 mg total) by mouth 2 (two) times daily with a meal. 180 tablet 2  . clopidogrel (PLAVIX) 75 MG tablet Take 1 tablet (75 mg total) by mouth daily with breakfast. 90 tablet 3  . fexofenadine (ALLEGRA) 180 MG tablet Take 180 mg by mouth daily.    . furosemide (LASIX) 40 MG tablet Take 0.5 tablets (20 mg total) by mouth daily. 45 tablet 3  . insulin detemir (LEVEMIR) 100 UNIT/ML injection Inject 70 Units into the skin daily with supper.    . levETIRAcetam (KEPPRA) 500 MG tablet Take 1 tablet (500 mg total) by mouth 2 (two) times daily. 60 tablet 0  . nitroGLYCERIN (NITROSTAT) 0.4 MG SL tablet PLACE 1 TABLET UNDER THE TONGUE EVERY 5 MINUTES AS NEEDED FOR CHEST PAIN 75 tablet 0  . pantoprazole (PROTONIX) 40 MG tablet Take 40 mg by mouth daily.      . potassium chloride SA (K-DUR,KLOR-CON) 20 MEQ tablet Take 1 tablet (20 mEq total) by mouth daily. 90 tablet 3  . sertraline (ZOLOFT) 100 MG tablet Take 100 mg by mouth daily.     No current facility-administered medications for this visit.    Facility-Administered Medications Ordered in Other Visits  Medication Dose Route  Frequency Provider Last Rate Last Dose  . heparin infusion 2 units/mL in 0.9 % sodium chloride    Continuous PRN Martinique, Peter M, MD   1,000 mL at 01/15/16 0514  . iopamidol (ISOVUE-370) 76 % injection    PRN Martinique, Peter M, MD   105 mL at 01/15/16 0514  . lidocaine (PF) (XYLOCAINE) 1 % injection    PRN Martinique, Peter M, MD   2 mL at 01/15/16 0514  . Radial Cocktail/Verapamil only    PRN Martinique, Peter M, MD   10 mL at 01/15/16 0451    Allergies:   Atorvastatin and Nitrofurantoin monohyd macro   Social History:  The patient  reports that he quit smoking about 23 years ago. His smoking use included Cigarettes. He has a 66.00 pack-year smoking history. He has never used smokeless tobacco.  He reports that he does not drink alcohol or use drugs.   Family History:  The patient's  family history includes Coronary artery disease in his other; Heart disease in his father; Heart failure in his father.    ROS:  Please see the history of present illness.   All other systems are reviewed and negative.    PHYSICAL EXAM: VS:  BP 124/68   Pulse (!) 53   Ht 5\' 7"  (1.702 m)   Wt 189 lb 12.8 oz (86.1 kg)   SpO2 96%   BMI 29.73 kg/m  , BMI Body mass index is 29.73 kg/m. GEN: Well nourished, well developed, elderly male in no acute distress  HEENT: normal  Neck: no JVD, no masses. No carotid bruits Cardiac: RRR without murmur or gallop                Respiratory:  clear to auscultation bilaterally, normal work of breathing GI: soft, nontender, nondistended, + BS MS: no deformity or atrophy  Ext: no pretibial edema Skin: warm and dry, no rash Neuro:  Strength and sensation are intact Psych: euthymic mood, full affect  EKG:  EKG is not ordered today.  Recent Labs: 01/15/2016: ALT 13 01/16/2016: B Natriuretic Peptide 446.5; Hemoglobin 12.0; Platelets 160 02/18/2016: BUN 27; Creatinine, Ser 1.52; Magnesium 2.0; Potassium 3.8; Sodium 138   Lipid Panel     Component Value Date/Time   CHOL 215 (H)  01/17/2016 0416   TRIG 238 (H) 01/17/2016 0416   HDL 27 (L) 01/17/2016 0416   CHOLHDL 8.0 01/17/2016 0416   VLDL 48 (H) 01/17/2016 0416   LDLCALC 140 (H) 01/17/2016 0416      Wt Readings from Last 3 Encounters:  06/11/16 189 lb 12.8 oz (86.1 kg)  03/09/16 192 lb 6.4 oz (87.3 kg)  02/18/16 187 lb 6.4 oz (85 kg)     Cardiac Studies Reviewed: 2D Echo 01/16/2016: Study Conclusions  - Procedure narrative: Transthoracic echocardiography. Technically   difficult study with reduced echo windows. Definity contrast was   administered. - Left ventricle: The cavity size was normal. Wall thickness was   increased in a pattern of moderate LVH. Systolic function was   mildly reduced. The estimated ejection fraction was in the range   of 45% to 50%. Diffuse hypokinesis. The study is not technically   sufficient to allow evaluation of LV diastolic function. - Aortic valve: Poorly visualized. - Left atrium: The atrium was mildly dilated. - Right atrium: The atrium was at the upper limits of normal in   size. - Inferior vena cava: The vessel was normal in size. The   respirophasic diameter changes were in the normal range (>= 50%),   consistent with normal central venous pressure.  Impressions:  - Technically difficult study. LVEF appears similar to prior study   in 2015. Difficult to assess wall motion due to frequent PVC&'s.  ASSESSMENT AND PLAN: 1.  CAD, native vessel, without angina: Patient's history reviewed as above. His most recent cardiac catheterization study is reviewed and this demonstrated patency of his stent sites. He will continue on dual antiplatelet therapy with aspirin and Plavix. His medical program will be continued without change.  2. Chronic combined systolic and diastolic heart failure: The patient is euvolemic on exam. He is not having any symptoms that his reduced functional capacity. He will continue his current medical program which is reviewed today.  3.  Recurrent stroke: Patient with an implantable loop recorder. He continues to be followed by  the EP service.  4. Hypertension: Blood pressure well controlled  5. Hyperlipidemia: Hold atorvastatin until symptoms improve. Repeat lipids and LFTs in one month. Consider low-dose Crestor after his lab work is done next month.  Current medicines are reviewed with the patient today.  The patient does not have concerns regarding medicines.  Labs/ tests ordered today include:   Orders Placed This Encounter  Procedures  . Lipid panel  . Hepatic function panel    Disposition:   FU 6 months with Richardson Dopp, PA-C.   Deatra James, MD  06/11/2016 12:48 PM    [address], [place], [place]  [address] Phone: [phone]; Fax: [phone]

## 2016-06-18 ENCOUNTER — Ambulatory Visit (INDEPENDENT_AMBULATORY_CARE_PROVIDER_SITE_OTHER): Payer: Medicare Other | Admitting: *Deleted

## 2016-06-18 DIAGNOSIS — I638 Other cerebral infarction: Secondary | ICD-10-CM | POA: Diagnosis not present

## 2016-06-18 DIAGNOSIS — I6389 Other cerebral infarction: Secondary | ICD-10-CM

## 2016-06-19 ENCOUNTER — Encounter: Payer: Self-pay | Admitting: Physician Assistant

## 2016-06-19 NOTE — Progress Notes (Signed)
Carelink Summary Report / Loop Recorder 

## 2016-06-29 LAB — CUP PACEART REMOTE DEVICE CHECK
Implantable Pulse Generator Implant Date: 20180108
MDC IDC SESS DTM: 20180607183758

## 2016-06-29 NOTE — Progress Notes (Signed)
Carelink summary report received. Battery status OK. Normal device function. No new symptom episodes, tachy episodes, brady, or pause episodes. No new AF episodes. Monthly summary reports and ROV/PRN 

## 2016-07-13 ENCOUNTER — Other Ambulatory Visit: Payer: Medicare Other

## 2016-07-20 ENCOUNTER — Ambulatory Visit (INDEPENDENT_AMBULATORY_CARE_PROVIDER_SITE_OTHER): Payer: Medicare Other | Admitting: *Deleted

## 2016-07-20 DIAGNOSIS — I638 Other cerebral infarction: Secondary | ICD-10-CM | POA: Diagnosis not present

## 2016-07-20 DIAGNOSIS — I6389 Other cerebral infarction: Secondary | ICD-10-CM

## 2016-07-21 NOTE — Progress Notes (Signed)
Carelink Summary Report / Loop Recorder 

## 2016-07-29 LAB — CUP PACEART REMOTE DEVICE CHECK
Date Time Interrogation Session: 20180707195842
Implantable Pulse Generator Implant Date: 20180108

## 2016-07-30 ENCOUNTER — Telehealth: Payer: Self-pay | Admitting: Cardiology

## 2016-07-30 NOTE — Telephone Encounter (Signed)
LMOVM requesting that pt send manual transmission b/c home monitor has not updated in at least 14 days.    

## 2016-08-04 DIAGNOSIS — R29810 Facial weakness: Secondary | ICD-10-CM | POA: Diagnosis not present

## 2016-08-04 DIAGNOSIS — R531 Weakness: Secondary | ICD-10-CM | POA: Diagnosis not present

## 2016-08-04 DIAGNOSIS — R079 Chest pain, unspecified: Secondary | ICD-10-CM | POA: Diagnosis not present

## 2016-08-04 DIAGNOSIS — E785 Hyperlipidemia, unspecified: Secondary | ICD-10-CM | POA: Diagnosis not present

## 2016-08-04 DIAGNOSIS — E119 Type 2 diabetes mellitus without complications: Secondary | ICD-10-CM | POA: Diagnosis not present

## 2016-08-04 DIAGNOSIS — I10 Essential (primary) hypertension: Secondary | ICD-10-CM | POA: Diagnosis not present

## 2016-08-04 DIAGNOSIS — I2510 Atherosclerotic heart disease of native coronary artery without angina pectoris: Secondary | ICD-10-CM | POA: Diagnosis not present

## 2016-08-04 DIAGNOSIS — I6350 Cerebral infarction due to unspecified occlusion or stenosis of unspecified cerebral artery: Secondary | ICD-10-CM | POA: Diagnosis not present

## 2016-08-05 DIAGNOSIS — G8194 Hemiplegia, unspecified affecting left nondominant side: Secondary | ICD-10-CM | POA: Diagnosis present

## 2016-08-05 DIAGNOSIS — K219 Gastro-esophageal reflux disease without esophagitis: Secondary | ICD-10-CM | POA: Diagnosis present

## 2016-08-05 DIAGNOSIS — Z955 Presence of coronary angioplasty implant and graft: Secondary | ICD-10-CM | POA: Diagnosis not present

## 2016-08-05 DIAGNOSIS — Z7902 Long term (current) use of antithrombotics/antiplatelets: Secondary | ICD-10-CM | POA: Diagnosis not present

## 2016-08-05 DIAGNOSIS — I69392 Facial weakness following cerebral infarction: Secondary | ICD-10-CM | POA: Diagnosis not present

## 2016-08-05 DIAGNOSIS — I69998 Other sequelae following unspecified cerebrovascular disease: Secondary | ICD-10-CM | POA: Diagnosis not present

## 2016-08-05 DIAGNOSIS — Z794 Long term (current) use of insulin: Secondary | ICD-10-CM | POA: Diagnosis not present

## 2016-08-05 DIAGNOSIS — I2510 Atherosclerotic heart disease of native coronary artery without angina pectoris: Secondary | ICD-10-CM | POA: Diagnosis not present

## 2016-08-05 DIAGNOSIS — N390 Urinary tract infection, site not specified: Secondary | ICD-10-CM | POA: Diagnosis not present

## 2016-08-05 DIAGNOSIS — R531 Weakness: Secondary | ICD-10-CM | POA: Diagnosis not present

## 2016-08-05 DIAGNOSIS — E785 Hyperlipidemia, unspecified: Secondary | ICD-10-CM | POA: Diagnosis not present

## 2016-08-05 DIAGNOSIS — R569 Unspecified convulsions: Secondary | ICD-10-CM | POA: Diagnosis present

## 2016-08-05 DIAGNOSIS — Z79899 Other long term (current) drug therapy: Secondary | ICD-10-CM | POA: Diagnosis not present

## 2016-08-05 DIAGNOSIS — Z7982 Long term (current) use of aspirin: Secondary | ICD-10-CM | POA: Diagnosis not present

## 2016-08-05 DIAGNOSIS — G464 Cerebellar stroke syndrome: Secondary | ICD-10-CM | POA: Diagnosis not present

## 2016-08-05 DIAGNOSIS — I6523 Occlusion and stenosis of bilateral carotid arteries: Secondary | ICD-10-CM | POA: Diagnosis present

## 2016-08-05 DIAGNOSIS — E119 Type 2 diabetes mellitus without complications: Secondary | ICD-10-CM | POA: Diagnosis not present

## 2016-08-05 DIAGNOSIS — I10 Essential (primary) hypertension: Secondary | ICD-10-CM | POA: Diagnosis not present

## 2016-08-05 DIAGNOSIS — R29810 Facial weakness: Secondary | ICD-10-CM | POA: Diagnosis not present

## 2016-08-05 DIAGNOSIS — R079 Chest pain, unspecified: Secondary | ICD-10-CM | POA: Diagnosis not present

## 2016-08-05 DIAGNOSIS — G8190 Hemiplegia, unspecified affecting unspecified side: Secondary | ICD-10-CM | POA: Diagnosis not present

## 2016-08-05 DIAGNOSIS — I6789 Other cerebrovascular disease: Secondary | ICD-10-CM | POA: Diagnosis not present

## 2016-08-05 DIAGNOSIS — I6350 Cerebral infarction due to unspecified occlusion or stenosis of unspecified cerebral artery: Secondary | ICD-10-CM | POA: Diagnosis not present

## 2016-08-05 DIAGNOSIS — R29707 NIHSS score 7: Secondary | ICD-10-CM | POA: Diagnosis not present

## 2016-08-05 DIAGNOSIS — I639 Cerebral infarction, unspecified: Secondary | ICD-10-CM | POA: Diagnosis present

## 2016-08-05 DIAGNOSIS — I69354 Hemiplegia and hemiparesis following cerebral infarction affecting left non-dominant side: Secondary | ICD-10-CM | POA: Diagnosis not present

## 2016-08-07 ENCOUNTER — Encounter: Payer: Self-pay | Admitting: Cardiology

## 2016-08-07 ENCOUNTER — Ambulatory Visit: Payer: Federal, State, Local not specified - PPO | Admitting: Sports Medicine

## 2016-08-08 DIAGNOSIS — R062 Wheezing: Secondary | ICD-10-CM | POA: Diagnosis not present

## 2016-08-08 DIAGNOSIS — I69392 Facial weakness following cerebral infarction: Secondary | ICD-10-CM | POA: Diagnosis not present

## 2016-08-08 DIAGNOSIS — E785 Hyperlipidemia, unspecified: Secondary | ICD-10-CM | POA: Diagnosis not present

## 2016-08-08 DIAGNOSIS — I10 Essential (primary) hypertension: Secondary | ICD-10-CM | POA: Diagnosis not present

## 2016-08-08 DIAGNOSIS — I6350 Cerebral infarction due to unspecified occlusion or stenosis of unspecified cerebral artery: Secondary | ICD-10-CM | POA: Diagnosis not present

## 2016-08-08 DIAGNOSIS — G464 Cerebellar stroke syndrome: Secondary | ICD-10-CM | POA: Diagnosis not present

## 2016-08-08 DIAGNOSIS — Z794 Long term (current) use of insulin: Secondary | ICD-10-CM | POA: Diagnosis not present

## 2016-08-08 DIAGNOSIS — I2510 Atherosclerotic heart disease of native coronary artery without angina pectoris: Secondary | ICD-10-CM | POA: Diagnosis not present

## 2016-08-08 DIAGNOSIS — I69354 Hemiplegia and hemiparesis following cerebral infarction affecting left non-dominant side: Secondary | ICD-10-CM | POA: Diagnosis not present

## 2016-08-08 DIAGNOSIS — G8190 Hemiplegia, unspecified affecting unspecified side: Secondary | ICD-10-CM | POA: Diagnosis not present

## 2016-08-08 DIAGNOSIS — E1151 Type 2 diabetes mellitus with diabetic peripheral angiopathy without gangrene: Secondary | ICD-10-CM | POA: Diagnosis not present

## 2016-08-08 DIAGNOSIS — E119 Type 2 diabetes mellitus without complications: Secondary | ICD-10-CM | POA: Diagnosis not present

## 2016-08-10 DIAGNOSIS — I6350 Cerebral infarction due to unspecified occlusion or stenosis of unspecified cerebral artery: Secondary | ICD-10-CM | POA: Diagnosis not present

## 2016-08-10 DIAGNOSIS — I2510 Atherosclerotic heart disease of native coronary artery without angina pectoris: Secondary | ICD-10-CM | POA: Diagnosis not present

## 2016-08-10 DIAGNOSIS — E1151 Type 2 diabetes mellitus with diabetic peripheral angiopathy without gangrene: Secondary | ICD-10-CM | POA: Diagnosis not present

## 2016-08-10 DIAGNOSIS — E785 Hyperlipidemia, unspecified: Secondary | ICD-10-CM | POA: Diagnosis not present

## 2016-08-14 ENCOUNTER — Telehealth: Payer: Self-pay | Admitting: Cardiology

## 2016-08-14 NOTE — Telephone Encounter (Signed)
Patient wife called and stated that patient home monitor has not updated b/c patient has been in a rehab facility do to a stoke. Informed pt wife that she could take home monitor to the facility and send manual transmission from there or she could wait to until the patient was home but if patient is not near the monitor then we are unable to monitor him and if he was to have an arrhythmia we would not be alerted to this. Pt wife verbalized understating.

## 2016-08-17 ENCOUNTER — Encounter: Payer: Medicare Other | Admitting: *Deleted

## 2016-08-19 ENCOUNTER — Encounter: Payer: Self-pay | Admitting: Cardiology

## 2016-08-28 ENCOUNTER — Ambulatory Visit: Payer: Medicare Other | Admitting: Sports Medicine

## 2016-09-01 DIAGNOSIS — I7389 Other specified peripheral vascular diseases: Secondary | ICD-10-CM | POA: Diagnosis not present

## 2016-09-01 DIAGNOSIS — Z794 Long term (current) use of insulin: Secondary | ICD-10-CM | POA: Diagnosis not present

## 2016-09-01 DIAGNOSIS — E1142 Type 2 diabetes mellitus with diabetic polyneuropathy: Secondary | ICD-10-CM | POA: Diagnosis not present

## 2016-09-01 DIAGNOSIS — I2510 Atherosclerotic heart disease of native coronary artery without angina pectoris: Secondary | ICD-10-CM | POA: Diagnosis not present

## 2016-09-01 DIAGNOSIS — K219 Gastro-esophageal reflux disease without esophagitis: Secondary | ICD-10-CM | POA: Diagnosis not present

## 2016-09-01 DIAGNOSIS — Z7902 Long term (current) use of antithrombotics/antiplatelets: Secondary | ICD-10-CM | POA: Diagnosis not present

## 2016-09-01 DIAGNOSIS — G40909 Epilepsy, unspecified, not intractable, without status epilepticus: Secondary | ICD-10-CM | POA: Diagnosis not present

## 2016-09-01 DIAGNOSIS — I5022 Chronic systolic (congestive) heart failure: Secondary | ICD-10-CM | POA: Diagnosis not present

## 2016-09-01 DIAGNOSIS — N189 Chronic kidney disease, unspecified: Secondary | ICD-10-CM | POA: Diagnosis not present

## 2016-09-01 DIAGNOSIS — Z7982 Long term (current) use of aspirin: Secondary | ICD-10-CM | POA: Diagnosis not present

## 2016-09-01 DIAGNOSIS — I69392 Facial weakness following cerebral infarction: Secondary | ICD-10-CM | POA: Diagnosis not present

## 2016-09-01 DIAGNOSIS — F339 Major depressive disorder, recurrent, unspecified: Secondary | ICD-10-CM | POA: Diagnosis not present

## 2016-09-01 DIAGNOSIS — I69354 Hemiplegia and hemiparesis following cerebral infarction affecting left non-dominant side: Secondary | ICD-10-CM | POA: Diagnosis not present

## 2016-09-01 DIAGNOSIS — E1122 Type 2 diabetes mellitus with diabetic chronic kidney disease: Secondary | ICD-10-CM | POA: Diagnosis not present

## 2016-09-01 DIAGNOSIS — I130 Hypertensive heart and chronic kidney disease with heart failure and stage 1 through stage 4 chronic kidney disease, or unspecified chronic kidney disease: Secondary | ICD-10-CM | POA: Diagnosis not present

## 2016-09-03 DIAGNOSIS — E1122 Type 2 diabetes mellitus with diabetic chronic kidney disease: Secondary | ICD-10-CM | POA: Diagnosis not present

## 2016-09-03 DIAGNOSIS — I5022 Chronic systolic (congestive) heart failure: Secondary | ICD-10-CM | POA: Diagnosis not present

## 2016-09-03 DIAGNOSIS — N189 Chronic kidney disease, unspecified: Secondary | ICD-10-CM | POA: Diagnosis not present

## 2016-09-03 DIAGNOSIS — I130 Hypertensive heart and chronic kidney disease with heart failure and stage 1 through stage 4 chronic kidney disease, or unspecified chronic kidney disease: Secondary | ICD-10-CM | POA: Diagnosis not present

## 2016-09-03 DIAGNOSIS — I69354 Hemiplegia and hemiparesis following cerebral infarction affecting left non-dominant side: Secondary | ICD-10-CM | POA: Diagnosis not present

## 2016-09-03 DIAGNOSIS — I69392 Facial weakness following cerebral infarction: Secondary | ICD-10-CM | POA: Diagnosis not present

## 2016-09-04 DIAGNOSIS — N189 Chronic kidney disease, unspecified: Secondary | ICD-10-CM | POA: Diagnosis not present

## 2016-09-04 DIAGNOSIS — I69354 Hemiplegia and hemiparesis following cerebral infarction affecting left non-dominant side: Secondary | ICD-10-CM | POA: Diagnosis not present

## 2016-09-04 DIAGNOSIS — I130 Hypertensive heart and chronic kidney disease with heart failure and stage 1 through stage 4 chronic kidney disease, or unspecified chronic kidney disease: Secondary | ICD-10-CM | POA: Diagnosis not present

## 2016-09-04 DIAGNOSIS — I5022 Chronic systolic (congestive) heart failure: Secondary | ICD-10-CM | POA: Diagnosis not present

## 2016-09-04 DIAGNOSIS — I69392 Facial weakness following cerebral infarction: Secondary | ICD-10-CM | POA: Diagnosis not present

## 2016-09-04 DIAGNOSIS — E1122 Type 2 diabetes mellitus with diabetic chronic kidney disease: Secondary | ICD-10-CM | POA: Diagnosis not present

## 2016-09-06 NOTE — Progress Notes (Signed)
[facility]  PATIENT: Carl Stephenson DOB: 1945-05-31   REASON FOR VISIT: Follow-up for history of stroke and recent stroke according to patient HISTORY FROM: Patient wife and daughter    HISTORY OF PRESENT ILLNESS:UPDATE 08/27/2018CM Carl Stephenson, 71 year old male returns for follow-up. He was last seen in this office by Carl Stephenson in February for stroke and seizure. Since that time he had a admission to [facility] in July for stroke and spent a month in [facility] for rehabilitation. He is now getting physical therapy and occupational therapy at home. We do not have any information from his [place] hospitalization. He remains on Plavix and aspirin for secondary stroke prevention. He has not had further stroke or TIA symptoms since his most recent admission. He remains on Keppra for seizure prophylaxis is on Crestor for hyperlipidemia without complaints of myalgias . Blood pressure the office today 118/78. Loop recorder without evidence of atrial fibrillation. He returns for reevaluation 03/09/16 Carl Stephenson is a 74 year caucasian male seen today for the first office follow-up visit following hospital admission for seizure and stroke in January 2018. He is accompanied by his wife and daughter. I have personally reviewed the hospital admission summary, imaging studies and evaluation.Carl Freimark Rummageis an 71 y.o.malewith no past medical history of seizure, head trauma, or febrile seizure. 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 Family and wife, patient had gotten up to go to the bathroom when suddenly he was noticed to fall down with both arms extended andlegs extended, eyes rolling back, foaming at the mouth and bleeding at the mouth. Per wife this  lasted for approximately 15 minutes and then patient was postictal and confused afterwards. EMS was called and patient was brought to the ED. On arrival to [facility] was taken to the cardiac cath lab which showed two-vessel obstructive CAD with diffuse mild LAD disease and diffuse disease in the first OM, multiple stents patent, and moderately elevated LVEDP.After discussing with family patient it was noted that  did not smoke, patient does not drink, patient did not drink on New Year's Eve, patient does not do any illicit drugs. Patient does have issues with fluid behind the eyes and now having some disconjugate gaze twitches was to see a neurologist in the future for an MRI. As stated patient has never had a seizure in the past. Currently patient is alert, he is able to come ease at the hospital but does not have any recollection of what happened. He is having some difficulty following commands secondary to postictal state. EEG was obtained which did not show any overt seizure activity however formal reading is pending . Patient  only had 1 seizure .Marland KitchenPatient was not considered for IV t-PA secondary to non-stroke presentation.  MRI scan of the brain showed 4 small punctate infarcts in the right MCA, right PCN right cerebellar territories. MRI of the brain showed posterior separation atherosclerotic moderate changes involving distal right vertebral artery only. Transthoracic echo showed normal ejection fraction with slight diminished 45-50% ejection and diffuse hypokinesis. EEG showed moderate diffuse slowing and focal slowingwith additional focal slowing over the right hemispheire. LDL cholesterol was elevated at 140. Hemoglobin A1c was 7.7. Patient was started on Keppra 500 twice daily. He states  his drinking while he has had no recurrent seizures or stroke or TIA symptoms. His meds are full physical recovery but does feel tired and sleepy a lot. He is currently on Keppra 500 twice daily. The family is  concerned and wonders if he needs to stay on it. His blood pressure is well controlled today it is 136/61. His fasting sugars yet ranging in the 140 range. Patient has been eating healthy he states his activity has not had any weight loss yet. He is on aspirin and Plavix and tolerating it well with only minor bruising and no bleeding. Patient had not been driving but is interested in driving soon.    REVIEW OF SYSTEMS: Full 14 system review of systems performed and notable only for those listed, all others are neg:  Constitutional: Fatigue  Cardiovascular: neg Ear/Nose/Throat: neg  Skin: neg Eyes: neg Respiratory: neg Gastroitestinal: neg  Hematology/Lymphatic: Easy bruising  Endocrine: neg Musculoskeletal: Walking difficulty Allergy/Immunology: neg Neurological: neg Psychiatric: neg Sleep : neg   ALLERGIES: Allergies  Allergen Reactions  . Atorvastatin Other (See Comments)    Leg weakness  . Nitrofurantoin Monohyd Macro Hives and Rash    HOME MEDICATIONS: Outpatient Medications Prior to Visit  Medication Sig Dispense Refill  . acetaminophen (TYLENOL) 325 MG tablet Take 2 tablets (650 mg total) by mouth every 4 (four) hours as needed for headache or mild pain. 30 tablet 0  . aspirin EC 81 MG EC tablet Take 1 tablet (81 mg total) by mouth daily. 30 tablet 3  . clopidogrel (PLAVIX) 75 MG tablet Take 1 tablet (75 mg total) by mouth daily with breakfast. 90 tablet 3  . fexofenadine (ALLEGRA) 180 MG tablet Take 180 mg by mouth daily.    . furosemide (LASIX) 40 MG tablet Take 0.5 tablets (20 mg total) by mouth daily. 45 tablet 3  . insulin detemir (LEVEMIR) 100 UNIT/ML injection Inject 70 Units into the skin daily with supper.    . levETIRAcetam (KEPPRA) 500 MG tablet Take 1 tablet (500 mg total) by mouth 2 (two) times daily. 60 tablet 0  . nitroGLYCERIN (NITROSTAT) 0.4 MG SL tablet PLACE 1 TABLET UNDER THE TONGUE EVERY 5 MINUTES AS NEEDED FOR CHEST PAIN 75 tablet 0  . pantoprazole  (PROTONIX) 40 MG tablet Take 40 mg by mouth daily.      . potassium chloride SA (K-DUR,KLOR-CON) 20 MEQ tablet Take 1 tablet (20 mEq total) by mouth daily. 90 tablet 3  . sertraline (ZOLOFT) 100 MG tablet Take 100 mg by mouth daily.    . carvedilol (COREG) 3.125 MG tablet Take 1 tablet (3.125 mg total) by mouth 2 (two) times daily with a meal. (Patient not taking: Reported on 09/07/2016) 180 tablet 2   Facility-Administered Medications Prior to Visit  Medication Dose Route Frequency Provider Last Rate Last Dose  . heparin infusion 2 units/mL in 0.9 % sodium chloride    Continuous PRN Martinique, Peter M, MD   1,000 mL at 01/15/16 0514  . iopamidol (ISOVUE-370) 76 % injection    PRN Martinique, Peter M, MD   105 mL at 01/15/16 0514  . lidocaine (PF) (XYLOCAINE) 1 % injection    PRN Martinique, Peter M, MD   2 mL at 01/15/16 0514  . Radial Cocktail/Verapamil only    PRN Martinique, Peter M, MD   10 mL at 01/15/16 0451    PAST MEDICAL HISTORY: Past Medical History:  Diagnosis Date  . Acute encephalopathy admitted 12/28/2013  . Bladder tumor  Recurrent  . Bradycardia 01/16/2016  . Cancer [facility])    "had TURBT; cancer free since ~ 11/2013/MD" (12/27/2013)  . Carotid artery disease ([place])    [place] 12/15: Bilateral ICA 1-39 // [place] 1/18: Bilateral ICA 1-39  . Chronic lower back pain   . Coronary artery disease    a. Cath 12/27/2013 EF 35-40%, 80-90% prox LAD treated with balloon angioplasty and 3.0x28 mm Xience DES, LCx with anomalous origin, 50-60% stenosis in OM, 50% stenosis in PDA and PLA // b. [place] 1/18: mLAD stent ok, dLAD 80, RI stent ok, oLCx stent ok, OM1 85, oRCA stent ok, mRA 40, dRCA 40, RPDA 60/60 >> med Rx  . Diabetes mellitus type II   . GERD (gastroesophageal reflux disease)   . History of echocardiogram    Echo 12/15: Mild LVH, EF 45-50, inf HK, Gr 1 diastolic dysfunction  // Echo 1/18: Moderate LVH, EF 45-50, diffuse HK, mild LAE, RA upper limits of normal // TEE 1/18:  EF 45, diffuse HK, frequent  PVCs, trivial MR, mild LAE, normal RVSF, no LA or RA clot, + PFO   . History of hiatal hernia   . Hyperlipidemia   . Hypertension   . Inferior myocardial infarction ([place]) 1994   Inferior, PCI and Streptokinase  . Kidney stones    "passed them all" (12/27/2013)  . Obesity   . Seizure disorder as sequela of cerebrovascular accident ([place])   . Stroke [facility]) ~ 2012; 2018   multiple strokes // embolic CVA 04/345 >> admx with encephalopathy >> s/p ILR  . TIA (transient ischemic attack) "several"  . Tobacco user    Quit 1994  . Wide-complex tachycardia ([place])     PAST SURGICAL HISTORY: Past Surgical History:  Procedure Laterality Date  . CARDIAC CATHETERIZATION     "he's had a couple before today where they just looked around" (12/27/2013)  . CARDIAC CATHETERIZATION N/A 01/15/2016   Procedure: Left Heart Cath and Coronary Angiography;  Surgeon: Peter M Martinique, MD;  Location: [place] CV [facility];  Service: Cardiovascular;  Laterality: N/A;  . CORONARY ANGIOPLASTY WITH STENT PLACEMENT     "he's had 3-4 stents put in before today"   . CORONARY ANGIOPLASTY WITH STENT PLACEMENT  12/27/2013   "1"  . EP IMPLANTABLE DEVICE N/A 01/20/2016   Procedure: Loop Recorder Insertion;  Surgeon: Evans Lance, MD;  Location: [place] [facility];  Service: Cardiovascular;  Laterality: N/A;  . LEFT HEART CATHETERIZATION WITH CORONARY ANGIOGRAM N/A 10/02/2011   Procedure: LEFT HEART CATHETERIZATION WITH CORONARY ANGIOGRAM;  Surgeon: [name] Bow, MD;  Location: [facility] CATH LAB;  Service: Cardiovascular;  Laterality: N/A;  . LEFT HEART CATHETERIZATION WITH CORONARY ANGIOGRAM N/A 12/27/2013   Procedure: LEFT HEART CATHETERIZATION WITH CORONARY ANGIOGRAM;  Surgeon: Blane Ohara, MD;  Location: [facility] CATH LAB;  Service: Cardiovascular;  Laterality: N/A;  . TEE WITHOUT CARDIOVERSION N/A 01/20/2016   Procedure: TRANSESOPHAGEAL ECHOCARDIOGRAM (TEE);  Surgeon: Larey Dresser, MD;  Location: [place];  Service:  Cardiovascular;  Laterality: N/A;  . TRANSURETHRAL RESECTION OF BLADDER TUMOR WITH GYRUS (TURBT-GYRUS)  "several times"    FAMILY HISTORY: Family History  Problem Relation Age of Onset  . Coronary artery disease Other   . Heart failure Father   . Heart disease Father     SOCIAL HISTORY: Social History   Social History  . Marital status: Married    Spouse name: N/A  . Number of children: N/A  . Years of education: N/A  Occupational History  . 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 Smoking status: Former Smoker    Packs/day: 3.00    Years: 22.00    Types: Cigarettes    Quit date: 08/12/1992  . Smokeless tobacco: Never Used  . Alcohol use No  . Drug use: No  . Sexual activity: Not on file   Other Topics Concern  . Not on file   Social History Narrative   Married    2 grown children     PHYSICAL EXAM  Vitals:   09/07/16 1522  BP: 118/78  Pulse: (!) 52  Weight: 191 lb 9.6 oz (86.9 kg)  Height: 5\' 7"  (1.702 m)   Body mass index is 30.01 kg/m.  Generalized: Well developed, Mildly obese male in no acute distress  Head: normocephalic and atraumatic,. Oropharynx benign  Neck: Supple, no carotid bruits  Cardiac: Regular rate rhythm, no murmur  Musculoskeletal: No deformity   Neurological examination   Mentation: Alert oriented to time, place, history taking. Attention span and concentration appropriate. Recent and remote memory intact.  Follows all commands speech and language fluent.   Cranial nerve II-XII: .Pupils were equal round reactive to light extraocular movements were full, visual field were full on confrontational test. Mild left facial weakness . hearing was intact to finger rubbing bilaterally. Uvula tongue midline. head turning and shoulder shrug were normal and symmetric.Tongue protrusion into cheek strength was normal. Motor: normal bulk and tone, full strength in the BUE, BLE, on the right, 4 out of 5  left upper and left lower extremity  Sensory: normal and symmetric to light touch, pinprick, and  Vibration, in the upper and lower extremities  Coordination: finger-nose-finger, no dysmetria Reflexes: 1+ upper lower and symmetric plantar responses were flexor bilaterally. Gait and Station: Rising up from seated position with push off. Ambulates short distance in the hall with rolling walker unable to heel toe or tandem walk . No difficulty with turns DIAGNOSTIC DATA (LABS, IMAGING, TESTING) - I reviewed patient records, labs, notes, testing and imaging myself where available.  Lab Results  Component Value Date   WBC 8.7 01/16/2016   HGB 12.0 (L) 01/16/2016   HCT 36.4 (L) 01/16/2016   MCV 77.6 (L) 01/16/2016   PLT 160 01/16/2016      Component Value Date/Time   NA 138 02/18/2016 1143   K 3.8 02/18/2016 1143   CL 96 02/18/2016 1143   CO2 24 02/18/2016 1143   GLUCOSE 300 (H) 02/18/2016 1143   GLUCOSE 183 (H) 01/20/2016 0424   BUN 27 02/18/2016 1143   CREATININE 1.52 (H) 02/18/2016 1143   CREATININE 1.00 09/25/2011 1648   CALCIUM 9.2 02/18/2016 1143   PROT 6.1 (L) 01/15/2016 0420   ALBUMIN 3.3 (L) 01/15/2016 0420   AST 20 01/15/2016 0420   ALT 13 (L) 01/15/2016 0420   ALKPHOS 61 01/15/2016 0420   BILITOT 0.5 01/15/2016 0420   GFRNONAA 45 (L) 02/18/2016 1143   GFRAA 53 (L) 02/18/2016 1143   Lab Results  Component Value Date   CHOL 215 (H) 01/17/2016   HDL 27 (L) 01/17/2016   LDLCALC 140 (H) 01/17/2016   TRIG 238 (H) 01/17/2016   CHOLHDL 8.0 01/17/2016   Lab Results  Component Value Date   HGBA1C 7.7 (H) 01/16/2016   No results found for: [id number] Lab Results  Component Value Date   TSH 1.070 12/28/2013      ASSESSMENT AND PLAN 71 year  Caucasian male with small right anterior and posterior circulation embolic infarcts in January 2018 with symptomatic generalized seizure. The strokes occurred after cardiac catheterization procedure. Admission to [facility]  July 4 recurrent stroke event we do not have those records    PLAN: Stressed the importance of management of risk factors to prevent further stroke Continue Plavix and aspirin for secondary stroke prevention Maintain strict control of hypertension with blood pressure goal below 130/90, today's reading 118/78 Control of diabetes with hemoglobin A1c below 6.5 followed by primary care continue insulin  Cholesterol with LDL cholesterol less than 70, followed by primary care,   continue statin drugs Crestor Continue PT and OT and home exercise program  Continue Keppra for seizure prophylaxis Loop recorder without evidence of atrial fibrillation Follow-up in 6 weeks with Carl Stephenson. Will sign to get recent medical records regarding stroke from [facility] I spent 25 minutes in total face to face time with the patient more than 50% of which was spent counseling and coordination of care, reviewing test results reviewing medications and discussing and reviewing the diagnosis of stroke and management of risk factors and further treatment options. , Rayburn Ma, [facility], APRN  [facility] [address] [place], [place] [address] [phone]

## 2016-09-07 ENCOUNTER — Encounter: Payer: Self-pay | Admitting: Nurse Practitioner

## 2016-09-07 ENCOUNTER — Ambulatory Visit (INDEPENDENT_AMBULATORY_CARE_PROVIDER_SITE_OTHER): Payer: Medicare Other | Admitting: Nurse Practitioner

## 2016-09-07 VITALS — BP 118/78 | HR 52 | Ht 67.0 in | Wt 191.6 lb

## 2016-09-07 DIAGNOSIS — I6330 Cerebral infarction due to thrombosis of unspecified cerebral artery: Secondary | ICD-10-CM | POA: Diagnosis not present

## 2016-09-07 DIAGNOSIS — R531 Weakness: Secondary | ICD-10-CM

## 2016-09-07 DIAGNOSIS — N189 Chronic kidney disease, unspecified: Secondary | ICD-10-CM | POA: Diagnosis not present

## 2016-09-07 DIAGNOSIS — I5022 Chronic systolic (congestive) heart failure: Secondary | ICD-10-CM | POA: Diagnosis not present

## 2016-09-07 DIAGNOSIS — R269 Unspecified abnormalities of gait and mobility: Secondary | ICD-10-CM

## 2016-09-07 DIAGNOSIS — I69354 Hemiplegia and hemiparesis following cerebral infarction affecting left non-dominant side: Secondary | ICD-10-CM | POA: Diagnosis not present

## 2016-09-07 DIAGNOSIS — E7800 Pure hypercholesterolemia, unspecified: Secondary | ICD-10-CM

## 2016-09-07 DIAGNOSIS — I10 Essential (primary) hypertension: Secondary | ICD-10-CM

## 2016-09-07 DIAGNOSIS — E1122 Type 2 diabetes mellitus with diabetic chronic kidney disease: Secondary | ICD-10-CM | POA: Diagnosis not present

## 2016-09-07 DIAGNOSIS — I130 Hypertensive heart and chronic kidney disease with heart failure and stage 1 through stage 4 chronic kidney disease, or unspecified chronic kidney disease: Secondary | ICD-10-CM | POA: Diagnosis not present

## 2016-09-07 DIAGNOSIS — I69392 Facial weakness following cerebral infarction: Secondary | ICD-10-CM | POA: Diagnosis not present

## 2016-09-07 NOTE — Patient Instructions (Signed)
Stressed the importance of management of risk factors to prevent further stroke ContinuePlavix and aspirin for secondary stroke prevention Maintain strict control of hypertension with blood pressure goal below 130/90, today's reading 118/78 Control of diabetes with hemoglobin A1c below 6.5 followed by primary care continue insulin  Cholesterol with LDL cholesterol less than 70, followed by primary care,   continue statin drugs Crestor Continue PT and OT and home exercise program  Continue Keppra for seizure prophylaxis Follow-up in 6 weeks with [name]. Will sign to get recent medical records regarding stroke from [facility]

## 2016-09-08 DIAGNOSIS — R748 Abnormal levels of other serum enzymes: Secondary | ICD-10-CM | POA: Diagnosis not present

## 2016-09-08 DIAGNOSIS — I6930 Unspecified sequelae of cerebral infarction: Secondary | ICD-10-CM | POA: Diagnosis not present

## 2016-09-08 DIAGNOSIS — I69359 Hemiplegia and hemiparesis following cerebral infarction affecting unspecified side: Secondary | ICD-10-CM | POA: Diagnosis not present

## 2016-09-08 DIAGNOSIS — I69319 Unspecified symptoms and signs involving cognitive functions following cerebral infarction: Secondary | ICD-10-CM | POA: Diagnosis not present

## 2016-09-08 DIAGNOSIS — I69328 Other speech and language deficits following cerebral infarction: Secondary | ICD-10-CM | POA: Diagnosis not present

## 2016-09-08 DIAGNOSIS — E1165 Type 2 diabetes mellitus with hyperglycemia: Secondary | ICD-10-CM | POA: Diagnosis not present

## 2016-09-09 DIAGNOSIS — I5022 Chronic systolic (congestive) heart failure: Secondary | ICD-10-CM | POA: Diagnosis not present

## 2016-09-09 DIAGNOSIS — E1122 Type 2 diabetes mellitus with diabetic chronic kidney disease: Secondary | ICD-10-CM | POA: Diagnosis not present

## 2016-09-09 DIAGNOSIS — N189 Chronic kidney disease, unspecified: Secondary | ICD-10-CM | POA: Diagnosis not present

## 2016-09-09 DIAGNOSIS — I69392 Facial weakness following cerebral infarction: Secondary | ICD-10-CM | POA: Diagnosis not present

## 2016-09-09 DIAGNOSIS — I130 Hypertensive heart and chronic kidney disease with heart failure and stage 1 through stage 4 chronic kidney disease, or unspecified chronic kidney disease: Secondary | ICD-10-CM | POA: Diagnosis not present

## 2016-09-09 DIAGNOSIS — I69354 Hemiplegia and hemiparesis following cerebral infarction affecting left non-dominant side: Secondary | ICD-10-CM | POA: Diagnosis not present

## 2016-09-10 DIAGNOSIS — I130 Hypertensive heart and chronic kidney disease with heart failure and stage 1 through stage 4 chronic kidney disease, or unspecified chronic kidney disease: Secondary | ICD-10-CM | POA: Diagnosis not present

## 2016-09-10 DIAGNOSIS — I69354 Hemiplegia and hemiparesis following cerebral infarction affecting left non-dominant side: Secondary | ICD-10-CM | POA: Diagnosis not present

## 2016-09-10 DIAGNOSIS — N189 Chronic kidney disease, unspecified: Secondary | ICD-10-CM | POA: Diagnosis not present

## 2016-09-10 DIAGNOSIS — I69392 Facial weakness following cerebral infarction: Secondary | ICD-10-CM | POA: Diagnosis not present

## 2016-09-10 DIAGNOSIS — E1122 Type 2 diabetes mellitus with diabetic chronic kidney disease: Secondary | ICD-10-CM | POA: Diagnosis not present

## 2016-09-10 DIAGNOSIS — I5022 Chronic systolic (congestive) heart failure: Secondary | ICD-10-CM | POA: Diagnosis not present

## 2016-09-10 NOTE — Progress Notes (Signed)
I agree with the above plan 

## 2016-09-11 DIAGNOSIS — I69354 Hemiplegia and hemiparesis following cerebral infarction affecting left non-dominant side: Secondary | ICD-10-CM | POA: Diagnosis not present

## 2016-09-11 DIAGNOSIS — N189 Chronic kidney disease, unspecified: Secondary | ICD-10-CM | POA: Diagnosis not present

## 2016-09-11 DIAGNOSIS — I5022 Chronic systolic (congestive) heart failure: Secondary | ICD-10-CM | POA: Diagnosis not present

## 2016-09-11 DIAGNOSIS — I130 Hypertensive heart and chronic kidney disease with heart failure and stage 1 through stage 4 chronic kidney disease, or unspecified chronic kidney disease: Secondary | ICD-10-CM | POA: Diagnosis not present

## 2016-09-11 DIAGNOSIS — E1122 Type 2 diabetes mellitus with diabetic chronic kidney disease: Secondary | ICD-10-CM | POA: Diagnosis not present

## 2016-09-11 DIAGNOSIS — I69392 Facial weakness following cerebral infarction: Secondary | ICD-10-CM | POA: Diagnosis not present

## 2016-09-14 DIAGNOSIS — E1122 Type 2 diabetes mellitus with diabetic chronic kidney disease: Secondary | ICD-10-CM | POA: Diagnosis not present

## 2016-09-14 DIAGNOSIS — I130 Hypertensive heart and chronic kidney disease with heart failure and stage 1 through stage 4 chronic kidney disease, or unspecified chronic kidney disease: Secondary | ICD-10-CM | POA: Diagnosis not present

## 2016-09-14 DIAGNOSIS — I5022 Chronic systolic (congestive) heart failure: Secondary | ICD-10-CM | POA: Diagnosis not present

## 2016-09-14 DIAGNOSIS — I69354 Hemiplegia and hemiparesis following cerebral infarction affecting left non-dominant side: Secondary | ICD-10-CM | POA: Diagnosis not present

## 2016-09-14 DIAGNOSIS — I69392 Facial weakness following cerebral infarction: Secondary | ICD-10-CM | POA: Diagnosis not present

## 2016-09-14 DIAGNOSIS — N189 Chronic kidney disease, unspecified: Secondary | ICD-10-CM | POA: Diagnosis not present

## 2016-09-15 DIAGNOSIS — I69354 Hemiplegia and hemiparesis following cerebral infarction affecting left non-dominant side: Secondary | ICD-10-CM | POA: Diagnosis not present

## 2016-09-15 DIAGNOSIS — N189 Chronic kidney disease, unspecified: Secondary | ICD-10-CM | POA: Diagnosis not present

## 2016-09-15 DIAGNOSIS — I130 Hypertensive heart and chronic kidney disease with heart failure and stage 1 through stage 4 chronic kidney disease, or unspecified chronic kidney disease: Secondary | ICD-10-CM | POA: Diagnosis not present

## 2016-09-15 DIAGNOSIS — I5022 Chronic systolic (congestive) heart failure: Secondary | ICD-10-CM | POA: Diagnosis not present

## 2016-09-15 DIAGNOSIS — I69392 Facial weakness following cerebral infarction: Secondary | ICD-10-CM | POA: Diagnosis not present

## 2016-09-15 DIAGNOSIS — E1122 Type 2 diabetes mellitus with diabetic chronic kidney disease: Secondary | ICD-10-CM | POA: Diagnosis not present

## 2016-09-16 ENCOUNTER — Ambulatory Visit (INDEPENDENT_AMBULATORY_CARE_PROVIDER_SITE_OTHER): Payer: Medicare Other | Admitting: *Deleted

## 2016-09-16 DIAGNOSIS — I638 Other cerebral infarction: Secondary | ICD-10-CM | POA: Diagnosis not present

## 2016-09-16 DIAGNOSIS — I6389 Other cerebral infarction: Secondary | ICD-10-CM

## 2016-09-17 DIAGNOSIS — I5022 Chronic systolic (congestive) heart failure: Secondary | ICD-10-CM | POA: Diagnosis not present

## 2016-09-17 DIAGNOSIS — N189 Chronic kidney disease, unspecified: Secondary | ICD-10-CM | POA: Diagnosis not present

## 2016-09-17 DIAGNOSIS — I69392 Facial weakness following cerebral infarction: Secondary | ICD-10-CM | POA: Diagnosis not present

## 2016-09-17 DIAGNOSIS — I130 Hypertensive heart and chronic kidney disease with heart failure and stage 1 through stage 4 chronic kidney disease, or unspecified chronic kidney disease: Secondary | ICD-10-CM | POA: Diagnosis not present

## 2016-09-17 DIAGNOSIS — E1122 Type 2 diabetes mellitus with diabetic chronic kidney disease: Secondary | ICD-10-CM | POA: Diagnosis not present

## 2016-09-17 DIAGNOSIS — I69354 Hemiplegia and hemiparesis following cerebral infarction affecting left non-dominant side: Secondary | ICD-10-CM | POA: Diagnosis not present

## 2016-09-17 NOTE — Progress Notes (Signed)
Carelink Summary Report / Loop Recorder 

## 2016-09-18 DIAGNOSIS — E1122 Type 2 diabetes mellitus with diabetic chronic kidney disease: Secondary | ICD-10-CM | POA: Diagnosis not present

## 2016-09-18 DIAGNOSIS — I69354 Hemiplegia and hemiparesis following cerebral infarction affecting left non-dominant side: Secondary | ICD-10-CM | POA: Diagnosis not present

## 2016-09-18 DIAGNOSIS — N189 Chronic kidney disease, unspecified: Secondary | ICD-10-CM | POA: Diagnosis not present

## 2016-09-18 DIAGNOSIS — I5022 Chronic systolic (congestive) heart failure: Secondary | ICD-10-CM | POA: Diagnosis not present

## 2016-09-18 DIAGNOSIS — I130 Hypertensive heart and chronic kidney disease with heart failure and stage 1 through stage 4 chronic kidney disease, or unspecified chronic kidney disease: Secondary | ICD-10-CM | POA: Diagnosis not present

## 2016-09-18 DIAGNOSIS — I69392 Facial weakness following cerebral infarction: Secondary | ICD-10-CM | POA: Diagnosis not present

## 2016-09-21 ENCOUNTER — Telehealth: Payer: Self-pay | Admitting: *Deleted

## 2016-09-21 DIAGNOSIS — I5022 Chronic systolic (congestive) heart failure: Secondary | ICD-10-CM | POA: Diagnosis not present

## 2016-09-21 DIAGNOSIS — I130 Hypertensive heart and chronic kidney disease with heart failure and stage 1 through stage 4 chronic kidney disease, or unspecified chronic kidney disease: Secondary | ICD-10-CM | POA: Diagnosis not present

## 2016-09-21 DIAGNOSIS — N189 Chronic kidney disease, unspecified: Secondary | ICD-10-CM | POA: Diagnosis not present

## 2016-09-21 DIAGNOSIS — I69392 Facial weakness following cerebral infarction: Secondary | ICD-10-CM | POA: Diagnosis not present

## 2016-09-21 DIAGNOSIS — E1122 Type 2 diabetes mellitus with diabetic chronic kidney disease: Secondary | ICD-10-CM | POA: Diagnosis not present

## 2016-09-21 DIAGNOSIS — I69354 Hemiplegia and hemiparesis following cerebral infarction affecting left non-dominant side: Secondary | ICD-10-CM | POA: Diagnosis not present

## 2016-09-21 LAB — CUP PACEART REMOTE DEVICE CHECK
Date Time Interrogation Session: 20180905204349
Implantable Pulse Generator Implant Date: 20180108

## 2016-09-21 NOTE — Telephone Encounter (Signed)
Noted that patient's most recent summary report revealed 35 "brady" episodes and 1 "pause" episode.  No ECGs available as monitor has been disconnected since 07/2016.  Spoke with patient's wife, Carl Stephenson ([place]).  She reports that the patient had another stroke and was in rehab without his monitor.  They just recently plugged it back in when he returned home.  Walked her through manual transmission steps for review of episodes.  Patient's wife reports that the patient has not had any symptoms of dizziness, lightheadedness, or syncope.  She does note that the rehab facility called her on multiple occasions regarding patient's low HR, ranging from the 30s-50s.  She reports that as far as she is aware, all of the patient's MDs know about his low HR.  Advised patient that I will review transmission with Dr. Lovena Le once it is received and call her back if any additional recommendations.  She is appreciative and denies additional questions or concerns at this time.

## 2016-09-22 NOTE — Telephone Encounter (Signed)
Manual transmission received. Pause and brady episodes are appropriate, possibly during naps during the day per wife.  Will review with Dr. Lovena Le for any additional recommendations.  Spoke with patient's wife, confirmed that carvedilol was previously d/c due to bradycardia.  Advised her to call our office or seek emergency medical attention if patient ever has a presyncopal/syncopal episode, depending on severity.  Patient's wife verbalizes understanding and appreciation.  She denies additional questions or concerns at this time.

## 2016-09-23 DIAGNOSIS — I69392 Facial weakness following cerebral infarction: Secondary | ICD-10-CM | POA: Diagnosis not present

## 2016-09-23 DIAGNOSIS — I130 Hypertensive heart and chronic kidney disease with heart failure and stage 1 through stage 4 chronic kidney disease, or unspecified chronic kidney disease: Secondary | ICD-10-CM | POA: Diagnosis not present

## 2016-09-23 DIAGNOSIS — N189 Chronic kidney disease, unspecified: Secondary | ICD-10-CM | POA: Diagnosis not present

## 2016-09-23 DIAGNOSIS — E1122 Type 2 diabetes mellitus with diabetic chronic kidney disease: Secondary | ICD-10-CM | POA: Diagnosis not present

## 2016-09-23 DIAGNOSIS — I5022 Chronic systolic (congestive) heart failure: Secondary | ICD-10-CM | POA: Diagnosis not present

## 2016-09-23 DIAGNOSIS — I69354 Hemiplegia and hemiparesis following cerebral infarction affecting left non-dominant side: Secondary | ICD-10-CM | POA: Diagnosis not present

## 2016-09-29 DIAGNOSIS — I130 Hypertensive heart and chronic kidney disease with heart failure and stage 1 through stage 4 chronic kidney disease, or unspecified chronic kidney disease: Secondary | ICD-10-CM | POA: Diagnosis not present

## 2016-09-29 DIAGNOSIS — N189 Chronic kidney disease, unspecified: Secondary | ICD-10-CM | POA: Diagnosis not present

## 2016-09-29 DIAGNOSIS — E1122 Type 2 diabetes mellitus with diabetic chronic kidney disease: Secondary | ICD-10-CM | POA: Diagnosis not present

## 2016-09-29 DIAGNOSIS — I69392 Facial weakness following cerebral infarction: Secondary | ICD-10-CM | POA: Diagnosis not present

## 2016-09-29 DIAGNOSIS — I69354 Hemiplegia and hemiparesis following cerebral infarction affecting left non-dominant side: Secondary | ICD-10-CM | POA: Diagnosis not present

## 2016-09-29 DIAGNOSIS — I5022 Chronic systolic (congestive) heart failure: Secondary | ICD-10-CM | POA: Diagnosis not present

## 2016-10-01 DIAGNOSIS — I69392 Facial weakness following cerebral infarction: Secondary | ICD-10-CM | POA: Diagnosis not present

## 2016-10-01 DIAGNOSIS — I5022 Chronic systolic (congestive) heart failure: Secondary | ICD-10-CM | POA: Diagnosis not present

## 2016-10-01 DIAGNOSIS — I130 Hypertensive heart and chronic kidney disease with heart failure and stage 1 through stage 4 chronic kidney disease, or unspecified chronic kidney disease: Secondary | ICD-10-CM | POA: Diagnosis not present

## 2016-10-01 DIAGNOSIS — I69354 Hemiplegia and hemiparesis following cerebral infarction affecting left non-dominant side: Secondary | ICD-10-CM | POA: Diagnosis not present

## 2016-10-01 DIAGNOSIS — E1122 Type 2 diabetes mellitus with diabetic chronic kidney disease: Secondary | ICD-10-CM | POA: Diagnosis not present

## 2016-10-01 DIAGNOSIS — N189 Chronic kidney disease, unspecified: Secondary | ICD-10-CM | POA: Diagnosis not present

## 2016-10-05 NOTE — Telephone Encounter (Signed)
Episodes were placed in Dr. Macon Large folder for review on 09/22/16.

## 2016-10-07 ENCOUNTER — Ambulatory Visit (INDEPENDENT_AMBULATORY_CARE_PROVIDER_SITE_OTHER): Payer: Medicare Other | Admitting: Sports Medicine

## 2016-10-07 ENCOUNTER — Encounter (INDEPENDENT_AMBULATORY_CARE_PROVIDER_SITE_OTHER): Payer: Self-pay

## 2016-10-07 DIAGNOSIS — B351 Tinea unguium: Secondary | ICD-10-CM

## 2016-10-07 DIAGNOSIS — E1142 Type 2 diabetes mellitus with diabetic polyneuropathy: Secondary | ICD-10-CM | POA: Diagnosis not present

## 2016-10-07 DIAGNOSIS — Z7901 Long term (current) use of anticoagulants: Secondary | ICD-10-CM

## 2016-10-07 DIAGNOSIS — M79676 Pain in unspecified toe(s): Secondary | ICD-10-CM

## 2016-10-07 DIAGNOSIS — I739 Peripheral vascular disease, unspecified: Secondary | ICD-10-CM | POA: Diagnosis not present

## 2016-10-07 DIAGNOSIS — Z8673 Personal history of transient ischemic attack (TIA), and cerebral infarction without residual deficits: Secondary | ICD-10-CM

## 2016-10-07 NOTE — Progress Notes (Signed)
Subjective: Carl Stephenson is a 71 y.o. male patient with history of diabetes who presents to office today complaining of long, painful nails  while ambulating in shoes; unable to trim. Patient states that the glucose reading this morning was good. Patient reports since last visit. He suffered a stroke now on anticoagulant, Plavix and was discharged from physical therapy on last week has some residual weakness and use his walker. Admits to baseline numbness, tingling and burning in both feet and occasional swelling. Denies any other acute symptoms are pedal complaints at this time.  Last saw primary care doctor earlier this month.  Patient Active Problem List   Diagnosis Date Noted  . Weakness 09/07/2016  . Gait abnormality 09/07/2016  . Chronic combined systolic and diastolic CHF (congestive heart failure) ([place]) 02/18/2016  . History of embolic stroke 78/93/8101  . Bradycardia 01/16/2016  . Abnormal EKG   . LBBB (left bundle branch block)   . Coronary artery disease involving native coronary artery of native heart without angina pectoris   . ECG abnormality   . History of CVA (cerebrovascular accident)   . Lactic acidosis   . Convulsions ([place])   . Hypotension 01/16/2014  . CVA (cerebral vascular accident) ([place]) 12/29/2013  . Acute encephalopathy 12/28/2013  . Seborrheic dermatitis 10/04/2012  . Microcytosis 11/07/2011  . Aphasia 06/26/2011  . 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 Cerebral thrombosis with cerebral infarction ([place]) 08/14/2010  . Temporary cerebral vascular dysfunction 10/24/2009  . BENIGN NEOPLASM OF BLADDER 03/16/2008  . Hyperlipidemia 03/16/2008  . Obesity 03/16/2008  . Essential hypertension 03/16/2008  . MYOCARDIAL INFARCTION 03/16/2008  . UNSPECIFIED PAROXYSMAL TACHYCARDIA 03/16/2008  . GERD 03/16/2008  . Diabetes mellitus ([place]) 03/16/2008   Current Outpatient Prescriptions on File Prior to Visit  Medication Sig Dispense Refill  .  acetaminophen (TYLENOL) 325 MG tablet Take 2 tablets (650 mg total) by mouth every 4 (four) hours as needed for headache or mild pain. 30 tablet 0  . aspirin EC 81 MG EC tablet Take 1 tablet (81 mg total) by mouth daily. 30 tablet 3  . clopidogrel (PLAVIX) 75 MG tablet Take 1 tablet (75 mg total) by mouth daily with breakfast. 90 tablet 3  . fexofenadine (ALLEGRA) 180 MG tablet Take 180 mg by mouth daily.    . furosemide (LASIX) 40 MG tablet Take 0.5 tablets (20 mg total) by mouth daily. 45 tablet 3  . insulin detemir (LEVEMIR) 100 UNIT/ML injection Inject 70 Units into the skin daily with supper.    . levETIRAcetam (KEPPRA) 500 MG tablet Take 1 tablet (500 mg total) by mouth 2 (two) times daily. 60 tablet 0  . nitroGLYCERIN (NITROSTAT) 0.4 MG SL tablet PLACE 1 TABLET UNDER THE TONGUE EVERY 5 MINUTES AS NEEDED FOR CHEST PAIN 75 tablet 0  . pantoprazole (PROTONIX) 40 MG tablet Take 40 mg by mouth daily.      . potassium chloride SA (K-DUR,KLOR-CON) 20 MEQ tablet Take 1 tablet (20 mEq total) by mouth daily. 90 tablet 3  . rosuvastatin (CRESTOR) 10 MG tablet TK 1 T PO QHS  0  . sertraline (ZOLOFT) 100 MG tablet Take 100 mg by mouth daily.     Current Facility-Administered Medications on File Prior to Visit  Medication Dose Route Frequency Provider Last Rate Last Dose  . heparin infusion 2 units/mL in 0.9 % sodium chloride    Continuous PRN [place], Peter M, MD   1,000 mL at 01/15/16 0514  . iopamidol (ISOVUE-370) 76 % injection  PRN [place], Peter M, MD   105 mL at 01/15/16 0514  . lidocaine (PF) (XYLOCAINE) 1 % injection    PRN [place], Peter M, MD   2 mL at 01/15/16 0514  . Radial Cocktail/Verapamil only    PRN [place], Peter M, MD   10 mL at 01/15/16 0451   Allergies  Allergen Reactions  . Atorvastatin Other (See Comments)    Leg weakness  . Nitrofurantoin Monohyd Macro Hives and Rash    Recent Results (from the past 2160 hour(s))  CUP PACEART REMOTE DEVICE CHECK     Status: None    Collection Time: 07/20/16  7:58 PM  Result Value Ref Range   Date Time Interrogation Session [id number]    Pulse Generator Manufacturer MERM    Pulse Gen Model [id number] Reveal LINQ    Pulse Gen Serial Number [id number]    Clinic Name [facility]    Implantable Pulse Generator Type ICM/ILR    Implantable Pulse Generator Implant Date [id number]    Eval Rhythm SR w/ PVCs   CUP PACEART REMOTE DEVICE CHECK     Status: None   Collection Time: 09/16/16  8:43 PM  Result Value Ref Range   Date Time Interrogation Session [id number]    Pulse Generator Manufacturer MERM    Pulse Gen Model [id number] Reveal LINQ    Pulse Gen Serial Number [id number]    Clinic Name [facility]    Implantable Pulse Generator Type ICM/ILR    Implantable Pulse Generator Implant Date [id number]    Eval Rhythm SR w/PVC     Objective: General: Patient is awake, alert, and oriented x 3 and in no acute distress.Walker-assisted gait.  Integument: Skin is warm, dry and supple bilateral. Nails are tender, long, thickened and dystrophic with subungual debris, consistent with onychomycosis, 1-5 bilateral. No signs of infection. No open lesions or preulcerative lesions present bilateral. Remaining integument unremarkable.  Vasculature:  Dorsalis Pedis pulse 2/4 bilateral. Posterior Tibial pulse  0/4 bilateralDue to 1+ pitting edema at ankles. Capillary fill time <3 sec 1-5 bilateral. Scant hair growth to the level of the digits.Temperature gradient within normal limits. Mild varicosities present bilateral.  Neurology: The patient has diminished sensation measured with a 5.07/10g Semmes Weinstein Monofilament at all pedal sites bilateral . Vibratory sensation diminished bilateral with tuning fork. No Babinski sign present bilateral.   Musculoskeletal: Asymptomatic pes planus and mild hammertoe pedal deformities noted bilateral. Muscular strength 4/5 in all lower extremity muscular groups bilateral without pain on range  of motion, history of stroke. No tenderness with calf compression bilateral.  Assessment and Plan: Problem List Items Addressed This Visit    None    Visit Diagnoses    Pain due to onychomycosis of toenail    -  Primary   Diabetic polyneuropathy associated with type 2 diabetes mellitus (HCC)       Anticoagulant long-term use       History of stroke       PVD (peripheral vascular disease) ([place])          -Examined patient. -Discussed and educated patient on diabetic foot care, especially with  regards to the vascular, neurological and musculoskeletal systems.  -Stressed the importance of good glycemic control and the detriment of not controlling glucose levels in relation to the foot. -Mechanically debrided all nails 1-5 bilateral using sterile nail nipper and filed with dremel without incident  -Diabetic shoe paperwork has expired. If patient desires diabetic shoes in the future we'll have to restart  the paperwork process. -Answered all patient questions -Patient to return  in 3 months for at risk foot care -Patient advised to call the office if any problems or questions arise in the meantime.  Landis Martins, DPM

## 2016-10-16 ENCOUNTER — Ambulatory Visit (INDEPENDENT_AMBULATORY_CARE_PROVIDER_SITE_OTHER): Payer: Medicare Other | Admitting: *Deleted

## 2016-10-16 DIAGNOSIS — I6389 Other cerebral infarction: Secondary | ICD-10-CM

## 2016-10-19 LAB — CUP PACEART REMOTE DEVICE CHECK
Date Time Interrogation Session: 20181005214213
Implantable Pulse Generator Implant Date: 20180108

## 2016-10-19 NOTE — Progress Notes (Signed)
Carelink Summary Report / Loop Recorder 

## 2016-10-20 ENCOUNTER — Other Ambulatory Visit: Payer: Self-pay | Admitting: Neurology

## 2016-10-20 ENCOUNTER — Ambulatory Visit (INDEPENDENT_AMBULATORY_CARE_PROVIDER_SITE_OTHER): Payer: Medicare Other | Admitting: Neurology

## 2016-10-20 ENCOUNTER — Encounter: Payer: Self-pay | Admitting: Neurology

## 2016-10-20 ENCOUNTER — Ambulatory Visit: Payer: Medicare Other | Admitting: Nurse Practitioner

## 2016-10-20 VITALS — BP 153/73 | HR 55 | Wt 190.0 lb

## 2016-10-20 DIAGNOSIS — I6330 Cerebral infarction due to thrombosis of unspecified cerebral artery: Secondary | ICD-10-CM

## 2016-10-20 DIAGNOSIS — I6381 Other cerebral infarction due to occlusion or stenosis of small artery: Secondary | ICD-10-CM

## 2016-10-20 MED ORDER — SERTRALINE HCL 100 MG PO TABS: 150.0000 mg | ORAL_TABLET | Freq: Every day | ORAL | 1 refills | Status: DC

## 2016-10-20 NOTE — Progress Notes (Signed)
[facility]  Carl Stephenson: Carl Stephenson DOB: 1945-05-31   REASON FOR VISIT: Follow-up for history of stroke and recent stroke according to Carl Stephenson HISTORY FROM: Carl Stephenson wife and daughter    HISTORY OF PRESENT ILLNESS:UPDATE 08/27/2018CM Carl Stephenson, 71 year old male returns for follow-up. He was last seen in this office by Carl Stephenson in February for stroke and seizure. Since that time he had a admission to [facility] in July for stroke and spent a month in [place] home for rehabilitation. He is now getting physical therapy and occupational therapy at home. We do not have any information from his [place] hospitalization. He remains on Plavix and aspirin for secondary stroke prevention. He has not had further stroke or TIA symptoms since his most recent admission. He remains on Keppra for seizure prophylaxis is on Crestor for hyperlipidemia without complaints of myalgias . Blood pressure the office today 118/78. Loop recorder without evidence of atrial fibrillation. He returns for reevaluation 03/09/16 [name] is a 74 year caucasian male seen today for the first office follow-up visit following hospital admission for seizure and stroke in January 2018. He is accompanied by his wife and daughter. I have personally reviewed the hospital admission summary, imaging studies and evaluation.Carl Freimark Rummageis an 71 y.o.malewith no past medical history of seizure, head trauma, or febrile seizure. Carl Stephenson does have a past medical history CAD with prior MI and PCI, type 2 diabetes, prior CVA in 2015 leaving him with a left facial droop and bladder cancer who presented to the ED on the third of 2018 after being found unresponsive at home. Family and wife, Carl Stephenson had gotten up to go to the bathroom when suddenly he was noticed to fall down with both arms extended andlegs extended, eyes rolling back, foaming at the mouth and bleeding at the mouth. Per wife this  lasted for approximately 15 minutes and then Carl Stephenson was postictal and confused afterwards. EMS was called and Carl Stephenson was brought to the ED. On arrival to [facility] was taken to the cardiac cath lab which showed two-vessel obstructive CAD with diffuse mild LAD disease and diffuse disease in the first OM, multiple stents patent, and moderately elevated LVEDP.After discussing with family Carl Stephenson it was noted that  did not smoke, Carl Stephenson does not drink, Carl Stephenson did not drink on New Year's Eve, Carl Stephenson does not do any illicit drugs. Carl Stephenson does have issues with fluid behind the eyes and now having some disconjugate gaze twitches was to see a neurologist in the future for an MRI. As stated Carl Stephenson has never had a seizure in the past. Currently Carl Stephenson is alert, he is able to come ease at the hospital but does not have any recollection of what happened. He is having some difficulty following commands secondary to postictal state. EEG was obtained which did not show any overt seizure activity however formal reading is pending . Carl Stephenson  only had 1 seizure .Carl KitchenPatient was not considered for IV t-PA secondary to non-stroke presentation.  MRI scan of the brain showed 4 small punctate infarcts in the right MCA, right PCN right cerebellar territories. MRI of the brain showed posterior separation atherosclerotic moderate changes involving distal right vertebral artery only. Transthoracic echo showed normal ejection fraction with slight diminished 45-50% ejection and diffuse hypokinesis. EEG showed moderate diffuse slowing and focal slowingwith additional focal slowing over the right hemispheire. LDL cholesterol was elevated at 140. Hemoglobin A1c was 7.7. Carl Stephenson was started on Keppra 500 twice daily. He states  his drinking while he has had no recurrent seizures or stroke or TIA symptoms. His meds are full physical recovery but does feel tired and sleepy a lot. He is currently on Keppra 500 twice daily. The family is  concerned and wonders if he needs to stay on it. His blood pressure is well controlled today it is 136/61. His fasting sugars yet ranging in the 140 range. Carl Stephenson has been eating healthy he states his activity has not had any weight loss yet. He is on aspirin and Plavix and tolerating it well with only minor bruising and no bleeding. Carl Stephenson had not been driving but is interested in driving soon.  Update 10/20/2016 : He returns for follow-up after last visit with the Gilford Raid, nurse practitioner on 09/07/16. He is accompanied by his mother and daughter. Carl Stephenson states is doing well is finished home physical and occupation therapy 2 weeks ago. He is walking with a walker he states his balance is good his had no falls. He is tolerating aspirin and Plavix well with only minor bruising and no bleeding. His blood pressure is usually well controlled and today it is elevated in office at 153/73. His sugar is doing better and lasted about an A1c was down to 6.8. He has seen his primary physician Carl Stephenson who is considering recommending the new PCS 9 inhibitor injections for his statin intolerance. Carl Stephenson is taking Zoloft 100 mg daily for depression but the family feels his depression is not yet optimally controlled. I have personally reviewed his hospitalization records from [facility] for a stroke in July 2018. MRI scan of the brain on 08/05/16 shows small acute infarct in the right paramedian pons as well as small late subacute infarct in the left parietal white matter with extensive changes of chronic small vessel disease CT angiogram of the brain showed no emergent large vessel occlusion but moderate multifocal intracranial atherosclerotic changes involving distal right vertebral artery, proximal left posterior cerebral artery and mid right posterior cerebral artery as well as from small right M1 middle cerebral artery stenosis.  REVIEW OF SYSTEMS: Full 14 system review of systems performed and  notable only for those listed, all others are neg:   Light-sensitivity, leg swelling, memory loss, bladder urgency, speech difficulty, weakness, confusion, agitation, depression and all the systems negative ALLERGIES: Allergies  Allergen Reactions  . Atorvastatin Other (See Comments)    Leg weakness  . Nitrofurantoin Monohyd Macro Hives and Rash    HOME MEDICATIONS: Outpatient Medications Prior to Visit  Medication Sig Dispense Refill  . acetaminophen (TYLENOL) 325 MG tablet Take 2 tablets (650 mg total) by mouth every 4 (four) hours as needed for headache or mild pain. 30 tablet 0  . aspirin EC 81 MG EC tablet Take 1 tablet (81 mg total) by mouth daily. 30 tablet 3  . clopidogrel (PLAVIX) 75 MG tablet Take 1 tablet (75 mg total) by mouth daily with breakfast. 90 tablet 3  . fexofenadine (ALLEGRA) 180 MG tablet Take 180 mg by mouth daily.    . furosemide (LASIX) 40 MG tablet Take 0.5 tablets (20 mg total) by mouth daily. 45 tablet 3  . insulin detemir (LEVEMIR) 100 UNIT/ML injection Inject 70 Units into the skin daily with supper.    . levETIRAcetam (KEPPRA) 500 MG tablet Take 1 tablet (500 mg total) by mouth 2 (two) times daily. 60 tablet 0  . nitroGLYCERIN (NITROSTAT) 0.4 MG SL tablet PLACE 1 TABLET UNDER THE TONGUE EVERY 5 MINUTES AS NEEDED FOR  CHEST PAIN 75 tablet 0  . pantoprazole (PROTONIX) 40 MG tablet Take 40 mg by mouth daily.      . potassium chloride SA (K-DUR,KLOR-CON) 20 MEQ tablet Take 1 tablet (20 mEq total) by mouth daily. 90 tablet 3  . sertraline (ZOLOFT) 100 MG tablet Take 150 mg by mouth daily.    . rosuvastatin (CRESTOR) 10 MG tablet TK 1 T PO QHS  0   Facility-Administered Medications Prior to Visit  Medication Dose Route Frequency Provider Last Rate Last Dose  . heparin infusion 2 units/mL in 0.9 % sodium chloride    Continuous PRN Martinique, Peter M, MD   1,000 mL at 01/15/16 0514  . iopamidol (ISOVUE-370) 76 % injection    PRN Martinique, Peter M, MD   105 mL at  01/15/16 0514  . lidocaine (PF) (XYLOCAINE) 1 % injection    PRN Martinique, Peter M, MD   2 mL at 01/15/16 0514  . Radial Cocktail/Verapamil only    PRN Martinique, Peter M, MD   10 mL at 01/15/16 0451    PAST MEDICAL HISTORY: Past Medical History:  Diagnosis Date  . Acute encephalopathy admitted 12/28/2013  . Bladder tumor    Recurrent  . Bradycardia 01/16/2016  . Cancer [facility])    "had TURBT; cancer free since ~ 11/2013/MD" (12/27/2013)  . Carotid artery disease ([place])    [place] 12/15: Bilateral ICA 1-39 // [place] 1/18: Bilateral ICA 1-39  . Chronic lower back pain   . Coronary artery disease    a. Cath 12/27/2013 EF 35-40%, 80-90% prox LAD treated with balloon angioplasty and 3.0x28 mm Xience DES, LCx with anomalous origin, 50-60% stenosis in OM, 50% stenosis in PDA and PLA // b. [place] 1/18: mLAD stent ok, dLAD 80, RI stent ok, oLCx stent ok, OM1 85, oRCA stent ok, mRA 40, dRCA 40, RPDA 60/60 >> med Rx  . Diabetes mellitus type II   . GERD (gastroesophageal reflux disease)   . History of echocardiogram    Echo 12/15: Mild LVH, EF 45-50, inf HK, Gr 1 diastolic dysfunction  // Echo 1/18: Moderate LVH, EF 45-50, diffuse HK, mild LAE, RA upper limits of normal // TEE 1/18:  EF 45, diffuse HK, frequent PVCs, trivial MR, mild LAE, normal RVSF, no LA or RA clot, + PFO   . History of hiatal hernia   . Hyperlipidemia   . Hypertension   . Inferior myocardial infarction ([place]) 1994   Inferior, PCI and Streptokinase  . Kidney stones    "passed them all" (12/27/2013)  . Obesity   . Seizure disorder as sequela of cerebrovascular accident ([place])   . Stroke [facility]) ~ 2012; 2018   multiple strokes // embolic CVA 01/6107 >> admx with encephalopathy >> s/p ILR  . TIA (transient ischemic attack) "several"  . Tobacco user    Quit 1994  . Wide-complex tachycardia ([place])     PAST SURGICAL HISTORY: Past Surgical History:  Procedure Laterality Date  . CARDIAC CATHETERIZATION     "he's had a couple before today where they  just looked around" (12/27/2013)  . CARDIAC CATHETERIZATION N/A 01/15/2016   Procedure: Left Heart Cath and Coronary Angiography;  Surgeon: Peter M Martinique, MD;  Location: [place] CV LAB;  Service: Cardiovascular;  Laterality: N/A;  . CORONARY ANGIOPLASTY WITH STENT PLACEMENT     "he's had 3-4 stents put in before today"   . CORONARY ANGIOPLASTY WITH STENT PLACEMENT  12/27/2013   "1"  . EP IMPLANTABLE DEVICE N/A  01/20/2016   Procedure: Loop Recorder Insertion;  Surgeon: Evans Lance, MD;  Location: [place] CV LAB;  Service: Cardiovascular;  Laterality: N/A;  . LEFT HEART CATHETERIZATION WITH CORONARY ANGIOGRAM N/A 10/02/2011   Procedure: LEFT HEART CATHETERIZATION WITH CORONARY ANGIOGRAM;  Surgeon: [name] Bow, MD;  Location: [facility] CATH LAB;  Service: Cardiovascular;  Laterality: N/A;  . LEFT HEART CATHETERIZATION WITH CORONARY ANGIOGRAM N/A 12/27/2013   Procedure: LEFT HEART CATHETERIZATION WITH CORONARY ANGIOGRAM;  Surgeon: Blane Ohara, MD;  Location: [facility] CATH LAB;  Service: Cardiovascular;  Laterality: N/A;  . TEE WITHOUT CARDIOVERSION N/A 01/20/2016   Procedure: TRANSESOPHAGEAL ECHOCARDIOGRAM (TEE);  Surgeon: Larey Dresser, MD;  Location: [place];  Service: Cardiovascular;  Laterality: N/A;  . TRANSURETHRAL RESECTION OF BLADDER TUMOR WITH GYRUS (TURBT-GYRUS)  "several times"    FAMILY HISTORY: Family History  Problem Relation Age of Onset  . Coronary artery disease Other   . Heart failure Father   . Heart disease Father     SOCIAL HISTORY: Social History   Social History  . Marital status: Married    Spouse name: N/A  . Number of children: N/A  . Years of education: N/A   Occupational History  . 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 Smoking status: Former Smoker    Packs/day: 3.00    Years: 22.00    Types: Cigarettes    Quit date: 08/12/1992  . Smokeless tobacco: Never Used  . Alcohol use No  . Drug use: No    . Sexual activity: Not on file   Other Topics Concern  . Not on file   Social History Narrative   Married    2 grown children     PHYSICAL EXAM  Vitals:   10/20/16 1551  BP: (!) 153/73  Pulse: (!) 55  Weight: 190 lb (86.2 kg)   Body mass index is 29.76 kg/m.  Generalized: Well developed, Mildly obeseMiddle-age Caucasian male in no acute distress  Head: normocephalic and atraumatic,.  Neck: Supple, no carotid bruits  Cardiac: Regular rate rhythm, no murmur  Musculoskeletal: No deformity   Neurological examination   Mentation: Alert oriented to time, place, history taking. Attention span and concentration appropriate. Recent and remote memory intact.  Follows all commands speech and language fluent.   Cranial nerve II-XII: .Pupils were equal round reactive to light extraocular movements were full, visual field were full on confrontational test. Mild left facial weakness . hearing was intact to finger rubbing bilaterally. Uvula tongue midline. head turning and shoulder shrug were normal and symmetric.Tongue protrusion into cheek strength was normal. Motor: normal bulk and tone, full strength in the BUE, BLE, on the right, 4 out of 5 left upper and left lower extremity Increased tone in the left lower extremity. Diminished fine finger movements on the left. Orbits right over left upper extremity. Sensory: normal and symmetric to light touch, pinprick, and  Vibration, in the upper and lower extremities  Coordination: finger-nose-finger, no dysmetria Reflexes: 1+ upper lower and symmetric plantar responses were flexor bilaterally. Gait and Station: Rising up from seated position with push off. Ambulates short distance in the hall with rolling walker with dragging of the left foot. unable to heel toe or tandem walk . No difficulty with turns DIAGNOSTIC DATA (LABS, IMAGING, TESTING) - I reviewed Carl Stephenson records, labs, notes, testing and imaging myself where available.  Lab Results   Component Value Date   WBC 8.7 01/16/2016  HGB 12.0 (L) 01/16/2016   HCT 36.4 (L) 01/16/2016   MCV 77.6 (L) 01/16/2016   PLT 160 01/16/2016      Component Value Date/Time   NA 138 02/18/2016 1143   K 3.8 02/18/2016 1143   CL 96 02/18/2016 1143   CO2 24 02/18/2016 1143   GLUCOSE 300 (H) 02/18/2016 1143   GLUCOSE 183 (H) 01/20/2016 0424   BUN 27 02/18/2016 1143   CREATININE 1.52 (H) 02/18/2016 1143   CREATININE 1.00 09/25/2011 1648   CALCIUM 9.2 02/18/2016 1143   PROT 6.1 (L) 01/15/2016 0420   ALBUMIN 3.3 (L) 01/15/2016 0420   AST 20 01/15/2016 0420   ALT 13 (L) 01/15/2016 0420   ALKPHOS 61 01/15/2016 0420   BILITOT 0.5 01/15/2016 0420   GFRNONAA 45 (L) 02/18/2016 1143   GFRAA 53 (L) 02/18/2016 1143   Lab Results  Component Value Date   CHOL 215 (H) 01/17/2016   HDL 27 (L) 01/17/2016   LDLCALC 140 (H) 01/17/2016   TRIG 238 (H) 01/17/2016   CHOLHDL 8.0 01/17/2016   Lab Results  Component Value Date   HGBA1C 7.7 (H) 01/16/2016   No results found for: VITAMINB12 Lab Results  Component Value Date   TSH 1.070 12/28/2013      ASSESSMENT AND PLAN 24 year Caucasian male with small right anterior and posterior circulation embolic infarcts in January 2018 with symptomatic generalized seizure. The strokes occurred after cardiac catheterization procedure. Admission to [facility] July 4,2018 recurrent  Lacunar infarcts due to small vessel disease but he doesn't have large vessel intracranial atherosclerosis as well. Multiple vascular risk factors of diabetes, hypertension, hyperlipidemia and s    PLAN: I had a long d/w Carl Stephenson , mom and daughter about his recent stroke, risk for recurrent stroke/TIAs, personally independently reviewed imaging studies and stroke evaluation results and answered questions.Continue aspirin 81 mg daily and clopidogrel 75 mg daily  for secondary stroke prevention and maintain strict control of hypertension with blood pressure goal below  130/90, diabetes with hemoglobin A1c goal below 6.5% and lipids with LDL cholesterol goal below 70 mg/dL. I also advised the Carl Stephenson to eat a healthy diet with plenty of whole grains, cereals, fruits and vegetables, exercise regularly and maintain ideal body weight .I advised the Carl Stephenson to use a walker at all times and fall and safety precautions. He may also benefit with prescription for the new PCS K9 inhibitor injections like Praluent or Repatha for his statin intolerance  and I advised him to see his primary care physician for the same. I also advised him to increase his Zoloft to 150 mg daily to help with his subptimally treated depression Followup in the future with my nurse practitioner in 6 months or call earlier if necessary   I spent 25 minutes in total face to face time with the Carl Stephenson more than 50% of which was spent counseling and coordination of care, reviewing test results reviewing medications and discussing and reviewing the diagnosis of stroke and management of risk factors and further treatment options. ,   Antony Contras, MD [facility] [address], [place], [place] [address] [phone]

## 2016-10-20 NOTE — Patient Instructions (Addendum)
I had a long d/w patient , mom and daughter about his recent stroke, risk for recurrent stroke/TIAs, personally independently reviewed imaging studies and stroke evaluation results and answered questions.Continue aspirin 81 mg daily and clopidogrel 75 mg daily  for secondary stroke prevention and maintain strict control of hypertension with blood pressure goal below 130/90, diabetes with hemoglobin A1c goal below 6.5% and lipids with LDL cholesterol goal below 70 mg/dL. I also advised the patient to eat a healthy diet with plenty of whole grains, cereals, fruits and vegetables, exercise regularly and maintain ideal body weight .I advised the patient to use a walker at all times and fall and safety precautions. He may also benefit with prescription for the new PCS K9 inhibitor injections like Praluent or Repatha for his statin intolerance  and I advised him to see his primary care physician for the same. I also advised him to increase his Zoloft to 150 mg daily to help with his subptimally treated depression Followup in the future with my nurse practitioner in 6 months or call earlier if necessary

## 2016-10-21 ENCOUNTER — Other Ambulatory Visit: Payer: Self-pay

## 2016-10-21 ENCOUNTER — Encounter: Payer: Self-pay | Admitting: Physician Assistant

## 2016-10-21 MED ORDER — SERTRALINE HCL 100 MG PO TABS: 150.0000 mg | ORAL_TABLET | Freq: Every day | ORAL | 3 refills | Status: AC

## 2016-10-30 DIAGNOSIS — Z23 Encounter for immunization: Secondary | ICD-10-CM | POA: Diagnosis not present

## 2016-11-16 ENCOUNTER — Ambulatory Visit (INDEPENDENT_AMBULATORY_CARE_PROVIDER_SITE_OTHER): Payer: Medicare Other | Admitting: *Deleted

## 2016-11-16 DIAGNOSIS — I6389 Other cerebral infarction: Secondary | ICD-10-CM | POA: Diagnosis not present

## 2016-11-17 LAB — CUP PACEART REMOTE DEVICE CHECK
Implantable Pulse Generator Implant Date: 20180108
MDC IDC SESS DTM: 20181104224028

## 2016-11-17 NOTE — Progress Notes (Signed)
Carelink Summary Report / Loop Recorder 

## 2016-12-02 DIAGNOSIS — R300 Dysuria: Secondary | ICD-10-CM | POA: Diagnosis not present

## 2016-12-02 DIAGNOSIS — N3001 Acute cystitis with hematuria: Secondary | ICD-10-CM | POA: Diagnosis not present

## 2016-12-14 ENCOUNTER — Ambulatory Visit: Payer: Medicare Other | Admitting: Physician Assistant

## 2016-12-15 ENCOUNTER — Ambulatory Visit (INDEPENDENT_AMBULATORY_CARE_PROVIDER_SITE_OTHER): Payer: Medicare Other | Admitting: *Deleted

## 2016-12-15 DIAGNOSIS — I6389 Other cerebral infarction: Secondary | ICD-10-CM

## 2016-12-16 NOTE — Progress Notes (Signed)
Carelink Summary Report / Loop Recorder 

## 2016-12-25 LAB — CUP PACEART REMOTE DEVICE CHECK
Date Time Interrogation Session: 20181204230947
Implantable Pulse Generator Implant Date: 20180108

## 2016-12-30 ENCOUNTER — Other Ambulatory Visit: Payer: Self-pay | Admitting: Internal Medicine

## 2016-12-30 ENCOUNTER — Ambulatory Visit: Payer: Medicare Other | Admitting: Sports Medicine

## 2017-01-01 ENCOUNTER — Encounter: Payer: Self-pay | Admitting: Sports Medicine

## 2017-01-01 ENCOUNTER — Ambulatory Visit (INDEPENDENT_AMBULATORY_CARE_PROVIDER_SITE_OTHER): Payer: Medicare Other | Admitting: Sports Medicine

## 2017-01-01 DIAGNOSIS — B351 Tinea unguium: Secondary | ICD-10-CM | POA: Diagnosis not present

## 2017-01-01 DIAGNOSIS — I739 Peripheral vascular disease, unspecified: Secondary | ICD-10-CM

## 2017-01-01 DIAGNOSIS — E1142 Type 2 diabetes mellitus with diabetic polyneuropathy: Secondary | ICD-10-CM

## 2017-01-01 DIAGNOSIS — M79676 Pain in unspecified toe(s): Secondary | ICD-10-CM

## 2017-01-01 DIAGNOSIS — Z8673 Personal history of transient ischemic attack (TIA), and cerebral infarction without residual deficits: Secondary | ICD-10-CM

## 2017-01-01 DIAGNOSIS — Z7901 Long term (current) use of anticoagulants: Secondary | ICD-10-CM

## 2017-01-01 NOTE — Progress Notes (Signed)
Subjective: Carl Stephenson is a 71 y.o. male patient with history of diabetes who presents to office today complaining of long, painful nails  while ambulating in shoes; unable to trim. Patient states that the glucose reading this morning was the same around 70. Denies any other acute symptoms are pedal complaints at this time.   Patient Active Problem List   Diagnosis Date Noted  . Weakness 09/07/2016  . Gait abnormality 09/07/2016  . Chronic combined systolic and diastolic CHF (congestive heart failure) ([place]) 02/18/2016  . History of embolic stroke 05/39/7673  . Bradycardia 01/16/2016  . Abnormal EKG   . LBBB (left bundle branch block)   . Coronary artery disease involving native coronary artery of native heart without angina pectoris   . ECG abnormality   . History of CVA (cerebrovascular accident)   . Lactic acidosis   . Convulsions ([place])   . Hypotension 01/16/2014  . CVA (cerebral vascular accident) ([place]) 12/29/2013  . Acute encephalopathy 12/28/2013  . Seborrheic dermatitis 10/04/2012  . Microcytosis 11/07/2011  . Aphasia 06/26/2011  . 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 Cerebral thrombosis with cerebral infarction ([place]) 08/14/2010  . Temporary cerebral vascular dysfunction 10/24/2009  . BENIGN NEOPLASM OF BLADDER 03/16/2008  . Hyperlipidemia 03/16/2008  . Obesity 03/16/2008  . Essential hypertension 03/16/2008  . MYOCARDIAL INFARCTION 03/16/2008  . UNSPECIFIED PAROXYSMAL TACHYCARDIA 03/16/2008  . GERD 03/16/2008  . Diabetes mellitus ([place]) 03/16/2008   Current Outpatient Medications on File Prior to Visit  Medication Sig Dispense Refill  . acetaminophen (TYLENOL) 325 MG tablet Take 2 tablets (650 mg total) by mouth every 4 (four) hours as needed for headache or mild pain. 30 tablet 0  . aspirin EC 81 MG EC tablet Take 1 tablet (81 mg total) by mouth daily. 30 tablet 3  . clopidogrel (PLAVIX) 75 MG tablet Take 1 tablet (75 mg total) by mouth daily with  breakfast. 90 tablet 3  . fexofenadine (ALLEGRA) 180 MG tablet Take 180 mg by mouth daily.    . furosemide (LASIX) 40 MG tablet Take 0.5 tablets (20 mg total) by mouth daily. 45 tablet 3  . insulin detemir (LEVEMIR) 100 UNIT/ML injection Inject 70 Units into the skin daily with supper.    . levETIRAcetam (KEPPRA) 500 MG tablet Take 1 tablet (500 mg total) by mouth 2 (two) times daily. 60 tablet 0  . nitroGLYCERIN (NITROSTAT) 0.4 MG SL tablet PLACE 1 TABLET UNDER THE TONGUE EVERY 5 MINUTES AS NEEDED FOR CHEST PAIN 75 tablet 0  . pantoprazole (PROTONIX) 40 MG tablet Take 40 mg by mouth daily.      . potassium chloride SA (K-DUR,KLOR-CON) 20 MEQ tablet Take 1 tablet (20 mEq total) by mouth daily. 90 tablet 3  . rosuvastatin (CRESTOR) 10 MG tablet TK 1 T PO QHS  0  . sertraline (ZOLOFT) 100 MG tablet Take 1.5 tablets (150 mg total) by mouth daily. 90 tablet 3   Current Facility-Administered Medications on File Prior to Visit  Medication Dose Route Frequency Provider Last Rate Last Dose  . heparin infusion 2 units/mL in 0.9 % sodium chloride    Continuous PRN Martinique, Peter M, MD   1,000 mL at 01/15/16 0514  . iopamidol (ISOVUE-370) 76 % injection    PRN Martinique, Peter M, MD   105 mL at 01/15/16 0514  . lidocaine (PF) (XYLOCAINE) 1 % injection    PRN Martinique, Peter M, MD   2 mL at 01/15/16 0514  . Radial Cocktail/Verapamil only  PRN Martinique, Peter M, MD   10 mL at 01/15/16 0451   Allergies  Allergen Reactions  . Atorvastatin Other (See Comments)    Leg weakness  . Nitrofurantoin Monohyd Macro Hives and Rash    Recent Results (from the past 2160 hour(s))  CUP PACEART REMOTE DEVICE CHECK     Status: None   Collection Time: 10/16/16  9:42 PM  Result Value Ref Range   Date Time Interrogation Session [id number]    Pulse Generator Manufacturer MERM    Pulse Gen Model [id number] Reveal LINQ    Pulse Gen Serial Number [id number]    Clinic Name [facility]    Implantable Pulse Generator Type  ICM/ILR    Implantable Pulse Generator Implant Date [id number]    Eval Rhythm SR with PVCs   CUP PACEART REMOTE DEVICE CHECK     Status: None   Collection Time: 11/16/16 10:40 PM  Result Value Ref Range   Date Time Interrogation Session [phone]    Pulse Generator Manufacturer MERM    Pulse Gen Model [id number] Reveal LINQ    Pulse Gen Serial Number [id number]    Clinic Name [facility] Pulse Generator Type ICM/ILR    Implantable Pulse Generator Implant Date [id number]    Eval Rhythm SB   CUP PACEART REMOTE DEVICE CHECK     Status: None   Collection Time: 12/15/16 11:09 PM  Result Value Ref Range   Date Time Interrogation Session [id number]    Pulse Generator Manufacturer MERM    Pulse Gen Model IOX73 Reveal LINQ    Pulse Gen Serial Number [id number]    Clinic Name [facility]    Implantable Pulse Generator Type ICM/ILR    Implantable Pulse Generator Implant Date [id number]    Eval Rhythm SR     Objective: General: Patient is awake, alert, and oriented x 3 and in no acute distress.Walker-assisted gait.  Integument: Skin is warm, dry and supple bilateral. Nails are tender, long, thickened and dystrophic with subungual debris, consistent with onychomycosis, 1-5 bilateral. No signs of infection. No open lesions or preulcerative lesions present bilateral. Remaining integument unremarkable.  Vasculature:  Dorsalis Pedis pulse 2/4 bilateral. Posterior Tibial pulse  0/4 bilateralDue to 1+ pitting edema at ankles. Capillary fill time <3 sec 1-5 bilateral. Scant hair growth to the level of the digits.Temperature gradient within normal limits. Mild varicosities present bilateral.  Neurology: The patient has diminished sensation measured with a 5.07/10g Semmes Weinstein Monofilament at all pedal sites bilateral . Vibratory sensation diminished bilateral with tuning fork. No Babinski sign present bilateral.   Musculoskeletal: Asymptomatic pes planus and mild hammertoe  pedal deformities noted bilateral. Muscular strength 4/5 in all lower extremity muscular groups bilateral without pain on range of motion, history of stroke. No tenderness with calf compression bilateral.  Assessment and Plan: Problem List Items Addressed This Visit    None    Visit Diagnoses    Pain due to onychomycosis of toenail    -  Primary   Diabetic polyneuropathy associated with type 2 diabetes mellitus (HCC)       Anticoagulant long-term use       History of stroke       PVD (peripheral vascular disease) ([place])          -Examined patient. -Discussed and educated patient on diabetic foot care, especially with  regards to the vascular, neurological and musculoskeletal systems.  -Stressed the importance of good glycemic control and the detriment of not controlling  glucose levels in relation to the foot. -Mechanically debrided all nails 1-5 bilateral using sterile nail nipper and filed with dremel without incident  -Answered all patient questions -Patient to return  in 3 months for at risk foot care -Patient advised to call the office if any problems or questions arise in the meantime.  Landis Martins, DPM

## 2017-01-06 ENCOUNTER — Ambulatory Visit: Payer: Medicare Other | Admitting: Nurse Practitioner

## 2017-01-06 NOTE — Progress Notes (Deleted)
CARDIOLOGY OFFICE NOTE  Date:  01/06/2017    Carl Stephenson Date of Birth: 1945/09/30 Medical Record #[id number]  PCP:  Myer Peer, MD  Cardiologist:  Carl Stephenson & ***    No chief complaint on file.   History of Present Illness: Carl Stephenson is a 71 y.o. male who presents today for a ***   Comes in today. Here with   Past Medical History:  Diagnosis Date  . Acute encephalopathy admitted 12/28/2013  . Bladder tumor    Recurrent  . Bradycardia 01/16/2016  . Cancer [facility])    "had TURBT; cancer free since ~ 11/2013/MD" (12/27/2013)  . Carotid artery disease ([place])    [place] 12/15: Bilateral ICA 1-39 // [place] 1/18: Bilateral ICA 1-39  . Chronic lower back pain   . Coronary artery disease    a. Cath 12/27/2013 EF 35-40%, 80-90% prox LAD treated with balloon angioplasty and 3.0x28 mm Xience DES, LCx with anomalous origin, 50-60% stenosis in OM, 50% stenosis in PDA and PLA // b. [place] 1/18: mLAD stent ok, dLAD 80, RI stent ok, oLCx stent ok, OM1 85, oRCA stent ok, mRA 40, dRCA 40, RPDA 60/60 >> med Rx  . Diabetes mellitus type II   . GERD (gastroesophageal reflux disease)   . History of echocardiogram    Echo 12/15: Mild LVH, EF 45-50, inf HK, Gr 1 diastolic dysfunction  // Echo 1/18: Moderate LVH, EF 45-50, diffuse HK, mild LAE, RA upper limits of normal // TEE 1/18:  EF 45, diffuse HK, frequent PVCs, trivial MR, mild LAE, normal RVSF, no LA or RA clot, + PFO   . History of hiatal hernia   . Hyperlipidemia   . Hypertension   . 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 Kidney stones    "passed them all" (12/27/2013)  . Obesity   . Seizure disorder as sequela of cerebrovascular accident ([place])   . Stroke [facility]) ~ 2012; 2018   multiple strokes // embolic CVA 0/8657 >> admx with encephalopathy >> s/p ILR  . TIA (transient ischemic attack) "several"  . Tobacco user    Quit 1994  . Wide-complex tachycardia ([place])     Past Surgical  History:  Procedure Laterality Date  . CARDIAC CATHETERIZATION     "he's had a couple before today where they just looked around" (12/27/2013)  . CARDIAC CATHETERIZATION N/A 01/15/2016   Procedure: Left Heart Cath and Coronary Angiography;  Surgeon: Peter M Martinique, MD;  Location: [facility];  Service: Cardiovascular;  Laterality: N/A;  . CORONARY ANGIOPLASTY WITH STENT PLACEMENT     "he's had 3-4 stents put in before today"   . CORONARY ANGIOPLASTY WITH STENT PLACEMENT  12/27/2013   "1"  . EP IMPLANTABLE DEVICE N/A 01/20/2016   Procedure: Loop Recorder Insertion;  Surgeon: Evans Lance, MD;  Location: [facility];  Service: Cardiovascular;  Laterality: N/A;  . LEFT HEART CATHETERIZATION WITH CORONARY ANGIOGRAM N/A 10/02/2011   Procedure: LEFT HEART CATHETERIZATION WITH CORONARY ANGIOGRAM;  Surgeon: [name] Bow, MD;  Location: [facility] CATH LAB;  Service: Cardiovascular;  Laterality: N/A;  . LEFT HEART CATHETERIZATION WITH CORONARY ANGIOGRAM N/A 12/27/2013   Procedure: LEFT HEART CATHETERIZATION WITH CORONARY ANGIOGRAM;  Surgeon: Blane Ohara, MD;  Location: [facility] CATH LAB;  Service: Cardiovascular;  Laterality: N/A;  . TEE WITHOUT CARDIOVERSION N/A 01/20/2016   Procedure: TRANSESOPHAGEAL ECHOCARDIOGRAM (TEE);  Surgeon: Larey Dresser, MD;  Location:  [facility];  Service: Cardiovascular;  Laterality: N/A;  . TRANSURETHRAL RESECTION OF BLADDER TUMOR WITH GYRUS (TURBT-GYRUS)  "several times"     Medications: No outpatient medications have been marked as taking for the 01/06/17 encounter (Appointment) with Burtis Junes, NP.     Allergies: Allergies  Allergen Reactions  . Atorvastatin Other (See Comments)    Leg weakness  . Nitrofurantoin Monohyd Macro Hives and Rash    Social History: The patient  reports that he quit smoking about 24 years ago. His smoking use included cigarettes. He has a 66.00 pack-year smoking history. he has never used smokeless tobacco. He  reports that he does not drink alcohol or use drugs.   Family History: The patient's ***family history includes Coronary artery disease in his other; Heart disease in his father; Heart failure in his father.   Review of Systems: Please see the history of present illness.   Otherwise, the review of systems is positive for {NONE DEFAULTED:18576::"none"}.   All other systems are reviewed and negative.   Physical Exam: VS:  There were no vitals taken for this visit. [name]  BMI There is no height or weight on file to calculate BMI.  Wt Readings from Last 3 Encounters:  10/20/16 190 lb (86.2 kg)  09/07/16 191 lb 9.6 oz (86.9 kg)  06/11/16 189 lb 12.8 oz (86.1 kg)    General: Pleasant. Well developed, well nourished and in no acute distress.   HEENT: Normal.  Neck: Supple, no JVD, carotid bruits, or masses noted.  Cardiac: ***Regular rate and rhythm. No murmurs, rubs, or gallops. No edema.  Respiratory:  Lungs are clear to auscultation bilaterally with normal work of breathing.  GI: Soft and nontender.  MS: No deformity or atrophy. Gait and ROM intact.  Skin: Warm and dry. Color is normal.  Neuro:  Strength and sensation are intact and no gross focal deficits noted.  Psych: Alert, appropriate and with normal affect.   LABORATORY DATA:  EKG:  EKG {ACTION; IS/IS [id number]} ordered today. This demonstrates ***.  Lab Results  Component Value Date   WBC 8.7 01/16/2016   HGB 12.0 (L) 01/16/2016   HCT 36.4 (L) 01/16/2016   PLT 160 01/16/2016   GLUCOSE 300 (H) 02/18/2016   CHOL 215 (H) 01/17/2016   TRIG 238 (H) 01/17/2016   HDL 27 (L) 01/17/2016   LDLCALC 140 (H) 01/17/2016   ALT 13 (L) 01/15/2016   AST 20 01/15/2016   NA 138 02/18/2016   K 3.8 02/18/2016   CL 96 02/18/2016   CREATININE 1.52 (H) 02/18/2016   BUN 27 02/18/2016   CO2 24 02/18/2016   TSH 1.070 12/28/2013   INR 1.00 01/20/2016   HGBA1C 7.7 (H) 01/16/2016     BNP (last 3 results) Recent Labs    01/16/16 0353    BNP 446.5*    ProBNP (last 3 results) No results for input(s): PROBNP in the last 8760 hours.   Other Studies Reviewed Today:   Assessment/Plan:   Current medicines are reviewed with the patient today.  The patient does not have concerns regarding medicines other than what has been noted above.  The following changes have been made:  See above.  Labs/ tests ordered today include:   No orders of the defined types were placed in this encounter.    Disposition:   FU with *** in {gen number [id number]} {Days to years:10300}.   Patient is agreeable to this plan and will call if any problems develop in  the interim.   SignedTruitt Merle, NP  01/06/2017 7:29 AM  [facility] [address] [place], [place]  [address] Phone: [phone] Fax: [phone]

## 2017-01-14 ENCOUNTER — Ambulatory Visit (INDEPENDENT_AMBULATORY_CARE_PROVIDER_SITE_OTHER): Payer: Medicare Other | Admitting: *Deleted

## 2017-01-14 DIAGNOSIS — I6389 Other cerebral infarction: Secondary | ICD-10-CM | POA: Diagnosis not present

## 2017-01-15 NOTE — Progress Notes (Signed)
Carelink Summary Report / Loop Recorder 

## 2017-01-27 LAB — CUP PACEART REMOTE DEVICE CHECK
Date Time Interrogation Session: 20190103231215
MDC IDC PG IMPLANT DT: 20180108

## 2017-02-09 ENCOUNTER — Encounter: Payer: Self-pay | Admitting: Nurse Practitioner

## 2017-02-09 ENCOUNTER — Ambulatory Visit (INDEPENDENT_AMBULATORY_CARE_PROVIDER_SITE_OTHER): Payer: Medicare Other | Admitting: Nurse Practitioner

## 2017-02-09 ENCOUNTER — Encounter (INDEPENDENT_AMBULATORY_CARE_PROVIDER_SITE_OTHER): Payer: Self-pay

## 2017-02-09 VITALS — BP 140/80 | HR 56 | Ht 66.0 in | Wt 192.0 lb

## 2017-02-09 DIAGNOSIS — Z9889 Other specified postprocedural states: Secondary | ICD-10-CM

## 2017-02-09 DIAGNOSIS — E7800 Pure hypercholesterolemia, unspecified: Secondary | ICD-10-CM

## 2017-02-09 DIAGNOSIS — I2510 Atherosclerotic heart disease of native coronary artery without angina pectoris: Secondary | ICD-10-CM | POA: Diagnosis not present

## 2017-02-09 DIAGNOSIS — I10 Essential (primary) hypertension: Secondary | ICD-10-CM

## 2017-02-09 DIAGNOSIS — I5042 Chronic combined systolic (congestive) and diastolic (congestive) heart failure: Secondary | ICD-10-CM

## 2017-02-09 NOTE — Progress Notes (Signed)
CARDIOLOGY OFFICE NOTE  Date:  02/09/2017    Carl Stephenson Date of Birth: April 18, 1945 Medical Record #[id number]  PCP:  Myer Peer, MD  Cardiologist:  Burt Knack   Chief Complaint  Patient presents with  . Coronary Artery Disease  . Hypertension  . Hyperlipidemia    8 month check -  seen for Dr. Burt Knack    History of Present Illness: Carl Stephenson is a 72 y.o. male who presents today for a follow up visit. Seen for Dr. Burt Knack.   He has a history of coronary artery disease with history of multiple PCI procedures, last in 2015. The patient has had multiple strokes in the past. Other medical problems include hypertension, hyperlipidemia, diabetes. He was last hospitalized in January of 5643 with an embolic stroke complicated with seizure. He underwent cardiac catheterization emergently because of marked anterior ST elevation on his EKG. This demonstrated patent stent sites with no clear culprit for an acute event. Medical therapy was recommended. LVEF is 45-50%. He underwent implantable loop monitoring.  Last seen by Dr. Burt Knack back in May - was having some weakness and aching from his statin - improved with stopping. To consider starting Crestor. Otherwise, he was felt to be doing ok.   Comes in today. Here with his daughter and wife today. He is using a walker. Does not look like he had his follow up labs. But they say he was tried on Crestor and "it did the same thing". Family tells me that neurology mentioned possible PCSK9 therapy - they were to talk with PCP. No recent labs noted. No chest pain. Breathing is ok. He is pretty sedentary as a general rule. No falls reported. He uses his walker. They have started him on a MVI.   Past Medical History:  Diagnosis Date  . Acute encephalopathy admitted 12/28/2013  . Bladder tumor    Recurrent  . Bradycardia 01/16/2016  . Cancer [facility])    "had TURBT; cancer free since ~ 11/2013/MD" (12/27/2013)  . Carotid artery disease ([place])     [place] 12/15: Bilateral ICA 1-39 // [place] 1/18: Bilateral ICA 1-39  . Chronic lower back pain   . Coronary artery disease    a. Cath 12/27/2013 EF 35-40%, 80-90% prox LAD treated with balloon angioplasty and 3.0x28 mm Xience DES, LCx with anomalous origin, 50-60% stenosis in OM, 50% stenosis in PDA and PLA // b. [place] 1/18: mLAD stent ok, dLAD 80, RI stent ok, oLCx stent ok, OM1 85, oRCA stent ok, mRA 40, dRCA 40, RPDA 60/60 >> med Rx  . Diabetes mellitus type II   . GERD (gastroesophageal reflux disease)   . History of echocardiogram    Echo 12/15: Mild LVH, EF 45-50, inf HK, Gr 1 diastolic dysfunction  // Echo 1/18: Moderate LVH, EF 45-50, diffuse HK, mild LAE, RA upper limits of normal // TEE 1/18:  EF 45, diffuse HK, frequent PVCs, trivial MR, mild LAE, normal RVSF, no LA or RA clot, + PFO   . History of hiatal hernia   . Hyperlipidemia   . Hypertension   . 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 Kidney stones    "passed them all" (12/27/2013)  . Obesity   . Seizure disorder as sequela of cerebrovascular accident ([place])   . Stroke [facility]) ~ 2012; 2018   multiple strokes // embolic CVA 03/2949 >> admx with encephalopathy >> s/p ILR  . TIA (transient ischemic attack) "several"  .  Tobacco user    Quit 1994  . Wide-complex tachycardia ([place])     Past Surgical History:  Procedure Laterality Date  . CARDIAC CATHETERIZATION     "he's had a couple before today where they just looked around" (12/27/2013)  . CARDIAC CATHETERIZATION N/A 01/15/2016   Procedure: Left Heart Cath and Coronary Angiography;  Surgeon: Peter M Martinique, MD;  Location: [facility] CV [facility];  Service: Cardiovascular;  Laterality: N/A;  . CORONARY ANGIOPLASTY WITH STENT PLACEMENT     "he's had 3-4 stents put in before today"   . CORONARY ANGIOPLASTY WITH STENT PLACEMENT  12/27/2013   "1"  . EP IMPLANTABLE DEVICE N/A 01/20/2016   Procedure: Loop Recorder Insertion;  Surgeon: Evans Lance, MD;   Location: [facility];  Service: Cardiovascular;  Laterality: N/A;  . LEFT HEART CATHETERIZATION WITH CORONARY ANGIOGRAM N/A 10/02/2011   Procedure: LEFT HEART CATHETERIZATION WITH CORONARY ANGIOGRAM;  Surgeon: [name] Bow, MD;  Location: [facility] CATH LAB;  Service: Cardiovascular;  Laterality: N/A;  . LEFT HEART CATHETERIZATION WITH CORONARY ANGIOGRAM N/A 12/27/2013   Procedure: LEFT HEART CATHETERIZATION WITH CORONARY ANGIOGRAM;  Surgeon: Blane Ohara, MD;  Location: [facility] CATH LAB;  Service: Cardiovascular;  Laterality: N/A;  . TEE WITHOUT CARDIOVERSION N/A 01/20/2016   Procedure: TRANSESOPHAGEAL ECHOCARDIOGRAM (TEE);  Surgeon: Larey Dresser, MD;  Location: [facility];  Service: Cardiovascular;  Laterality: N/A;  . TRANSURETHRAL RESECTION OF BLADDER TUMOR WITH GYRUS (TURBT-GYRUS)  "several times"     Medications: Current Meds  Medication Sig  . acetaminophen (TYLENOL) 325 MG tablet Take 2 tablets (650 mg total) by mouth every 4 (four) hours as needed for headache or mild pain.  [name] aspirin EC 81 MG EC tablet Take 1 tablet (81 mg total) by mouth daily.  . clopidogrel (PLAVIX) 75 MG tablet Take 1 tablet (75 mg total) by mouth daily with breakfast.  . fexofenadine (ALLEGRA) 180 MG tablet Take 180 mg by mouth daily.  . insulin detemir (LEVEMIR) 100 UNIT/ML injection Inject 70 Units into the skin daily with supper.  . levETIRAcetam (KEPPRA) 500 MG tablet Take 1 tablet (500 mg total) by mouth 2 (two) times daily.  . nitroGLYCERIN (NITROSTAT) 0.4 MG SL tablet PLACE 1 TABLET UNDER THE TONGUE EVERY 5 MINUTES AS NEEDED FOR CHEST PAIN  . pantoprazole (PROTONIX) 40 MG tablet Take 40 mg by mouth daily.    . potassium chloride SA (K-DUR,KLOR-CON) 20 MEQ tablet Take 1 tablet (20 mEq total) by mouth daily.  . sertraline (ZOLOFT) 100 MG tablet Take 1.5 tablets (150 mg total) by mouth daily. (Patient taking differently: Take 100 mg by mouth daily. )     Allergies: Allergies  Allergen Reactions    . Statins Nausea Only    mylgia  . Atorvastatin Other (See Comments)    Leg weakness  . Nitrofurantoin Monohyd Macro Hives and Rash    Social History: The patient  reports that he quit smoking about 24 years ago. His smoking use included cigarettes. He has a 66.00 pack-year smoking history. he has never used smokeless tobacco. He reports that he does not drink alcohol or use drugs.   Family History: The patient's family history includes Coronary artery disease in his other; Heart disease in his father; Heart failure in his father.   Review of Systems: Please see the history of present illness.   Otherwise, the review of systems is positive for none.   All other systems are reviewed and negative.   Physical  Exam: VS:  BP 140/80 (BP Location: Left Arm, Patient Position: Sitting, Cuff Size: Normal)   Pulse (!) 56   Ht 5\' 6"  (1.676 m)   Wt 192 lb (87.1 kg)   SpO2 95% Comment: at rest  BMI 30.99 kg/m  .  BMI Body mass index is 30.99 kg/m.  Wt Readings from Last 3 Encounters:  02/09/17 192 lb (87.1 kg)  10/20/16 190 lb (86.2 kg)  09/07/16 191 lb 9.6 oz (86.9 kg)   BP recheck by me is 130/80.   General: Pleasant. Chronically ill. Looks older than his stated age. Alert and in no acute distress.   HEENT: Normal.  Neck: Supple, no JVD, carotid bruits, or masses noted.  Cardiac: Regular rate and rhythm. No murmurs, rubs, or gallops. No edema.  Respiratory:  Lungs are clear to auscultation bilaterally with normal work of breathing.  GI: Soft and nontender.  MS: No deformity or atrophy. Gait and ROM intact.  Skin: Warm and dry. Color is normal.  Neuro:  Strength and sensation are intact and no gross focal deficits noted.  Psych: Alert, appropriate and with normal affect.   LABORATORY DATA:  EKG:  EKG is ordered today. This demonstrates sinus brady - inferior and anteroseptal Q's. Lateral T wave changes.   Lab Results  Component Value Date   WBC 8.7 02/11/2016   HGB 12.0 (L)  02-11-2016   HCT 36.4 (L) 02/11/2016   PLT 160 02/11/2016   GLUCOSE 300 (H) 02/18/2016   CHOL 215 (H) 01/17/2016   TRIG 238 (H) 01/17/2016   HDL 27 (L) 01/17/2016   LDLCALC 140 (H) 01/17/2016   ALT 13 (L) 01/15/2016   AST 20 01/15/2016   NA 138 02/18/2016   K 3.8 02/18/2016   CL 96 02/18/2016   CREATININE 1.52 (H) 02/18/2016   BUN 27 02/18/2016   CO2 24 02/18/2016   TSH 1.070 12/28/2013   INR 1.00 01/20/2016   HGBA1C 7.7 (H) 02/11/16     BNP (last 3 results) No results for input(s): BNP in the last 8760 hours.  ProBNP (last 3 results) No results for input(s): PROBNP in the last 8760 hours.   Other Studies Reviewed Today:  2D Echo 2016/02/11: Study Conclusions  - Procedure narrative: Transthoracic echocardiography. Technically difficult study with reduced echo windows. Definity contrast was administered. - Left ventricle: The cavity size was normal. Wall thickness was increased in a pattern of moderate LVH. Systolic function was mildly reduced. The estimated ejection fraction was in the range of 45% to 50%. Diffuse hypokinesis. The study is not technically sufficient to allow evaluation of LV diastolic function. - Aortic valve: Poorly visualized. - Left atrium: The atrium was mildly dilated. - Right atrium: The atrium was at the upper limits of normal in size. - Inferior vena cava: The vessel was normal in size. The respirophasic diameter changes were in the normal range (>= 50%), consistent with normal central venous pressure.  Impressions:  - Technically difficult study. LVEF appears similar to prior study in 2015. Difficult to assess wall motion due to frequent PVC&'s.   Procedures   Left Heart Cath and Coronary Angiography 01/2016  Conclusion     LV end diastolic pressure is moderately elevated.  Proximal LAD lesion, 0 %stenosed at prior stent.  Mid LAD to Dist LAD lesion, 80 %stenosed.  Ramus lesion, 0 %stenosed at prior  stent  Ost RCA to Mid RCA lesion, 20 %stenosed at prior stent.  Mid RCA lesion, 40 %stenosed.  RPDA-2 lesion,  60 %stenosed.  RPDA-1 lesion, 60 %stenosed.  Dist RCA lesion, 40 %stenosed.  1st Mrg lesion, 85 %stenosed.  Ost Cx to Mid Cx lesion, 10 %stenosed at prior stent.   1. 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 2 vessel obstructive CAD with diffuse mid LAD disease and diffuse disease in the first OM from the anomalous LCx 3. Multiple stents patent including the proximal LAD, ramus intermediate, LCx, and proximal RCA 4. Moderately elevated LVEDP  Plan: patient has no clear culprit for his acute event. The LAD was diffusely diseased on prior study in 2015. The OM is worse than prior but still has TIMI 3 flow and is a small branch. The etiology of his acute unresponsiveness and encephalopathy remain unclear. Will admit for further evaluation with Critical care.       ASSESSMENT AND PLAN:  1.  CAD - with last cath a year ago - stents are patent - he remains on chronic DAPT - currently without chest pain/angina - would favor continued medical management.   2. Chronic combined systolic and diastolic heart failure: Weight is stable. Volume status looks ok. Lab checked today. Not on beta blocker - suspect due to resting bradycardia. Also not on ACE/ARB - maybe due to CKD? Will get his lab checked today. Currently in no distress - would favor conservative management.   3. Recurrent stroke: Patient with an implantable loop recorder. He continues to be followed by the EP service.  4. Hypertension: recheck by me is ok - no changes made today.   5. Hyperlipidemia: intolerant to Lipitor - intolerant to Crestor apparently too. They are interested in possible PCSK9 therapy - lab today. Referral to lipid clinic. May need to just consider Zetia.   Current medicines are reviewed with the patient today.  The patient does not have concerns regarding medicines other than what has  been noted above.  The following changes have been made:  See above.  Labs/ tests ordered today include:    Orders Placed This Encounter  Procedures  . Basic metabolic panel  . CBC  . Hepatic function panel  . Lipid panel  . EKG 12-Lead     Disposition:   FU with Dr. Burt Knack in 6 months.  I will be happy to see back as needed.   Patient is agreeable to this plan and will call if any problems develop in the interim.   SignedTruitt Merle, NP  02/09/2017 2:18 PM  [facility] [address] [place], [place]  [address] Phone: [phone] Fax: [phone]

## 2017-02-09 NOTE — Patient Instructions (Addendum)
We will be checking the following labs today - BMET, CBC, HPF and Lipids   Medication Instructions:    Continue with your current medicines.     Testing/Procedures To Be Arranged:  N/A  Follow-Up:   See Dr. Burt Knack in June/July     Other Special Instructions:   N/A    If you need a refill on your cardiac medications before your next appointment, please call your pharmacy.   Call the [place] office at [phone] if you have any questions, problems or concerns.

## 2017-02-10 LAB — BASIC METABOLIC PANEL
BUN/Creatinine Ratio: 15 (ref 10–24)
BUN: 19 mg/dL (ref 8–27)
CO2: 25 mmol/L (ref 20–29)
Calcium: 9 mg/dL (ref 8.6–10.2)
Chloride: 103 mmol/L (ref 96–106)
Creatinine, Ser: 1.24 mg/dL (ref 0.76–1.27)
GFR calc Af Amer: 67 mL/min/{1.73_m2} (ref >59)
GFR calc non Af Amer: 58 mL/min/{1.73_m2} — ABNORMAL LOW (ref >59)
Glucose: 142 mg/dL — ABNORMAL HIGH (ref 65–99)
Potassium: 4.3 mmol/L (ref 3.5–5.2)
Sodium: 142 mmol/L (ref 134–144)

## 2017-02-10 LAB — LIPID PANEL
Chol/HDL Ratio: 7.7 ratio — ABNORMAL HIGH (ref 0.0–5.0)
Cholesterol, Total: 231 mg/dL — ABNORMAL HIGH (ref 100–199)
HDL: 30 mg/dL — ABNORMAL LOW
LDL Calculated: 123 mg/dL — ABNORMAL HIGH (ref 0–99)
Triglycerides: 388 mg/dL — ABNORMAL HIGH (ref 0–149)
VLDL Cholesterol Cal: 78 mg/dL — ABNORMAL HIGH (ref 5–40)

## 2017-02-10 LAB — HEPATIC FUNCTION PANEL
ALT: 13 IU/L (ref 0–44)
AST: 17 IU/L (ref 0–40)
Albumin: 3.7 g/dL (ref 3.5–4.8)
Alkaline Phosphatase: 87 IU/L (ref 39–117)
Bilirubin Total: 0.4 mg/dL (ref 0.0–1.2)
Bilirubin, Direct: 0.11 mg/dL (ref 0.00–0.40)
Total Protein: 6 g/dL (ref 6.0–8.5)

## 2017-02-10 LAB — CBC
Hematocrit: 41.5 % (ref 37.5–51.0)
Hemoglobin: 14.1 g/dL (ref 13.0–17.7)
MCH: 26.3 pg — ABNORMAL LOW (ref 26.6–33.0)
MCHC: 34 g/dL (ref 31.5–35.7)
MCV: 77 fL — ABNORMAL LOW (ref 79–97)
Platelets: 190 10*3/uL (ref 150–379)
RBC: 5.36 x10E6/uL (ref 4.14–5.80)
RDW: 15.1 % (ref 12.3–15.4)
WBC: 10.5 10*3/uL (ref 3.4–10.8)

## 2017-02-15 ENCOUNTER — Ambulatory Visit (INDEPENDENT_AMBULATORY_CARE_PROVIDER_SITE_OTHER): Payer: Medicare Other | Admitting: *Deleted

## 2017-02-15 DIAGNOSIS — I6389 Other cerebral infarction: Secondary | ICD-10-CM

## 2017-02-15 NOTE — Progress Notes (Signed)
Carelink Summary Reprot / Loop Recorder 

## 2017-02-19 DIAGNOSIS — Z6830 Body mass index (BMI) 30.0-30.9, adult: Secondary | ICD-10-CM | POA: Diagnosis not present

## 2017-02-19 DIAGNOSIS — N390 Urinary tract infection, site not specified: Secondary | ICD-10-CM | POA: Diagnosis not present

## 2017-02-22 ENCOUNTER — Ambulatory Visit (INDEPENDENT_AMBULATORY_CARE_PROVIDER_SITE_OTHER): Payer: Medicare Other | Admitting: Pharmacist

## 2017-02-22 DIAGNOSIS — E7800 Pure hypercholesterolemia, unspecified: Secondary | ICD-10-CM | POA: Diagnosis not present

## 2017-02-22 DIAGNOSIS — I2510 Atherosclerotic heart disease of native coronary artery without angina pectoris: Secondary | ICD-10-CM | POA: Diagnosis not present

## 2017-02-22 NOTE — Patient Instructions (Addendum)
We will send for coverage of Praluent/Repatha through the [facility] in [place].   Call the office with any questions or when you start the medication.    Cholesterol Cholesterol is a fat. Your body needs a small amount of cholesterol. Cholesterol (plaque) may build up in your blood vessels (arteries). That makes you more likely to have a heart attack or stroke. You cannot feel your cholesterol level. Having a blood test is the only way to find out if your level is high. Keep your test results. Work with your doctor to keep your cholesterol at a good level. What do the results mean?  Total cholesterol is how much cholesterol is in your blood.  LDL is bad cholesterol. This is the type that can build up. Try to have low LDL.  HDL is good cholesterol. It cleans your blood vessels and carries LDL away. Try to have high HDL.  Triglycerides are fat that the body can store or burn for energy. What are good levels of cholesterol?  Total cholesterol below 200.  LDL below 100 is good for people who have health risks. LDL below 70 is good for people who have very high risks.  HDL above 40 is good. It is best to have HDL of 60 or higher.  Triglycerides below 150. How can I lower my cholesterol? Diet Follow your diet program as told by your doctor.  Choose fish, white meat chicken, or [place] that is roasted or baked. Try not to eat red meat, fried foods, sausage, or lunch meats.  Eat lots of fresh fruits and vegetables.  Choose whole grains, beans, pasta, potatoes, and cereals.  Choose olive oil, corn oil, or canola oil. Only use small amounts.  Try not to eat butter, mayonnaise, shortening, or palm kernel oils.  Try not to eat foods with trans fats.  Choose low-fat or nonfat dairy foods. ? Drink skim or nonfat milk. ? Eat low-fat or nonfat yogurt and cheeses. ? Try not to drink whole milk or cream. ? Try not to eat ice cream, egg yolks, or full-fat cheeses.  Healthy desserts include  angel food cake, ginger snaps, animal crackers, hard candy, popsicles, and low-fat or nonfat frozen yogurt. Try not to eat pastries, cakes, pies, and cookies.  Exercise Follow your exercise program as told by your doctor.  Be more active. Try gardening, walking, and taking the stairs.  Ask your doctor about ways that you can be more active.  Medicine  Take over-the-counter and prescription medicines only as told by your doctor. This information is not intended to replace advice given to you by your health care provider. Make sure you discuss any questions you have with your health care provider. Document Released: 03/27/2008 Document Revised: 07/31/2015 Document Reviewed: 07/11/2015 Elsevier Interactive Patient Education  [employer].

## 2017-02-22 NOTE — Progress Notes (Signed)
Patient ID: Carl Stephenson                 DOB: [birth year]                    MRN: [id number]     HPI: Carl Stephenson is a 72 y.o. male patient of Dr. Burt Stephenson, referred by Carl Merle, NP that presents today for lipid evaluation.  PMH includes coronary artery disease with history of multiple PCI procedures, last in 2015. The patient has had multiple strokes in the past. Other medical problems include hypertension, hyperlipidemia, diabetes. He was last hospitalized in January of 6433 with an embolic stroke complicated with seizure. He underwent cardiac catheterization emergently because of marked anterior ST elevation on his EKG. This demonstrated patent stent sites with no clear culprit for an acute event. Medical therapy was recommended. LVEF is 45-50%.   He presents today for discussion on lipids with his daughter. He reports that his muscles hurt when he took statin medications. He was tried on several different regimens by his primary care physician. His daughter reports that he was taking one of the medications just once a week previously and he even had muscle aching with that. Each time the muscle aching improved after stopping the medication.   Risk Factors: CAD s/p stenting, multiple strokes, HTN  LDL Goal: <70  Current Medications: none Intolerances: Crestor dosed daily and once weekly (muscle aching), Lipitor (muscle aches) both times aching improved after discontinuation of therapy. Zetia (unknown side effects)  Diet: Admits he could do better with eating vegetables. He eats chicken, fish, steak, and pork. He generally eats grilled, but does occasionally eat fried. He admits he drinks mostly soda. He occasionally eats potatoes and bread.   Exercise: Limited due walker. He is fairly sedentary.   Family History: Heart disease in his father; Heart failure in his father.   Social History: The patient  reports that he quit smoking about 24 years ago. His smoking use included  cigarettes. He has a 66.00 pack-year smoking history. he has never used smokeless tobacco. He reports that he does not drink alcohol or use drugs  Labs: 02/09/17: TC 231, TG 388, HDL 30, LDL 123, nonHDL 201 (no therapy)  Past Medical History:  Diagnosis Date  . Acute encephalopathy admitted 12/28/2013  . Bladder tumor    Recurrent  . Bradycardia 01/16/2016  . Cancer [facility])    "had TURBT; cancer free since ~ 11/2013/MD" (12/27/2013)  . Carotid artery disease ([place])    [place] 12/15: Bilateral ICA 1-39 // [place] 1/18: Bilateral ICA 1-39  . Chronic lower back pain   . Coronary artery disease    a. Cath 12/27/2013 EF 35-40%, 80-90% prox LAD treated with balloon angioplasty and 3.0x28 mm Xience DES, LCx with anomalous origin, 50-60% stenosis in OM, 50% stenosis in PDA and PLA // b. [place] 1/18: mLAD stent ok, dLAD 80, RI stent ok, oLCx stent ok, OM1 85, oRCA stent ok, mRA 40, dRCA 40, RPDA 60/60 >> med Rx  . Diabetes mellitus type II   . GERD (gastroesophageal reflux disease)   . History of echocardiogram    Echo 12/15: Mild LVH, EF 45-50, inf HK, Gr 1 diastolic dysfunction  // Echo 1/18: Moderate LVH, EF 45-50, diffuse HK, mild LAE, RA upper limits of normal // TEE 1/18:  EF 45, diffuse HK, frequent PVCs, trivial MR, mild LAE, normal RVSF, no LA or RA clot, + PFO   . History of  hiatal hernia   . Hyperlipidemia   . Hypertension   . Inferior myocardial infarction ([place]) 1994   Inferior, PCI and Streptokinase  . Kidney stones    "passed them all" (12/27/2013)  . Obesity   . Seizure disorder as sequela of cerebrovascular accident ([place])   . Stroke [facility]) ~ 2012; 2018   multiple strokes // embolic CVA 01/6071 >> admx with encephalopathy >> s/p ILR  . TIA (transient ischemic attack) "several"  . Tobacco user    Quit 1994  . Wide-complex tachycardia ([place])     Current Outpatient Medications on File Prior to Visit  Medication Sig Dispense Refill  . acetaminophen (TYLENOL) 325 MG tablet Take 2 tablets (650 mg  total) by mouth every 4 (four) hours as needed for headache or mild pain. 30 tablet 0  . aspirin EC 81 MG EC tablet Take 1 tablet (81 mg total) by mouth daily. 30 tablet 3  . clopidogrel (PLAVIX) 75 MG tablet Take 1 tablet (75 mg total) by mouth daily with breakfast. 90 tablet 3  . fexofenadine (ALLEGRA) 180 MG tablet Take 180 mg by mouth daily.    . furosemide (LASIX) 40 MG tablet Take 0.5 tablets (20 mg total) by mouth daily. 45 tablet 3  . insulin detemir (LEVEMIR) 100 UNIT/ML injection Inject 70 Units into the skin daily with supper.    . levETIRAcetam (KEPPRA) 500 MG tablet Take 1 tablet (500 mg total) by mouth 2 (two) times daily. 60 tablet 0  . nitroGLYCERIN (NITROSTAT) 0.4 MG SL tablet PLACE 1 TABLET UNDER THE TONGUE EVERY 5 MINUTES AS NEEDED FOR CHEST PAIN 75 tablet 0  . pantoprazole (PROTONIX) 40 MG tablet Take 40 mg by mouth daily.      . potassium chloride SA (K-DUR,KLOR-CON) 20 MEQ tablet Take 1 tablet (20 mEq total) by mouth daily. 90 tablet 3  . sertraline (ZOLOFT) 100 MG tablet Take 1.5 tablets (150 mg total) by mouth daily. (Patient taking differently: Take 100 mg by mouth daily. ) 90 tablet 3   Current Facility-Administered Medications on File Prior to Visit  Medication Dose Route Frequency Provider Last Rate Last Dose  . heparin infusion 2 units/mL in 0.9 % sodium chloride    Continuous PRN Martinique, Peter M, MD   1,000 mL at 01/15/16 0514  . iopamidol (ISOVUE-370) 76 % injection    PRN Martinique, Peter M, MD   105 mL at 01/15/16 0514  . lidocaine (PF) (XYLOCAINE) 1 % injection    PRN Martinique, Peter M, MD   2 mL at 01/15/16 0514  . Radial Cocktail/Verapamil only    PRN Martinique, Peter M, MD   10 mL at 01/15/16 0451    Allergies  Allergen Reactions  . Statins Nausea Only    mylgia  . Atorvastatin Other (See Comments)    Leg weakness  . Nitrofurantoin Monohyd Macro Hives and Rash    Assessment/Plan: Hyperlipidemia: LDL not at goal. Will pursue Repatha for LDL reduction as only  nonstatin option that will get LDL to goal. Injection technique, side effects, and monitoring discussed with patient and daughter. He gets his medications from the [facility] in [place]. We will send a letter to his physician there to try to get covered. His daughter will call with the physician information so that the letter and clinical information can be sent.     Thank you,  Lelan Pons. Patterson Hammersmith, [facility]  02/22/2017 7:33 AM  02/23/17: Daughter LM that physician  is Dr. Posey Pronto and phone number is [phone]. - unable to speak with anyone at number provided - requests SSN then transfers to operator then repeats over and over. Will request daughter to speak with provider to get fax number for clinic so that can be sent.  02/26/17: Wife aware to call with fax number and provider first name so that message can be sent.

## 2017-02-23 ENCOUNTER — Encounter: Payer: Self-pay | Admitting: Pharmacist

## 2017-03-09 LAB — CUP PACEART REMOTE DEVICE CHECK
Implantable Pulse Generator Implant Date: 20180108
MDC IDC SESS DTM: 20190202234011

## 2017-03-10 ENCOUNTER — Telehealth: Payer: Self-pay | Admitting: Pharmacist

## 2017-03-10 NOTE — Telephone Encounter (Signed)
Zenia Resides, RN from [facility] [place] with Dr. Serita Grit office called to report that Dr. Posey Pronto will prescribe PCSK9i to Carl Stephenson, but would like our recommendation for which formulation and dose. He advised that he is able to order Praluent, but does not see Repatha in his formulary. Advised that would request Praluent 75mg  Q14Days. He requests that this be sent in writing. I have faxed this information to his attention to number requested. He will call to let me know if he needs anything additionally to get [name] covered for Praluent.

## 2017-03-18 ENCOUNTER — Other Ambulatory Visit: Payer: Self-pay | Admitting: Internal Medicine

## 2017-03-18 ENCOUNTER — Ambulatory Visit (INDEPENDENT_AMBULATORY_CARE_PROVIDER_SITE_OTHER): Payer: Medicare Other | Admitting: *Deleted

## 2017-03-18 DIAGNOSIS — I6389 Other cerebral infarction: Secondary | ICD-10-CM

## 2017-03-19 NOTE — Progress Notes (Signed)
Carelink Summary Report / Loop Recorder 

## 2017-03-28 ENCOUNTER — Other Ambulatory Visit: Payer: Self-pay | Admitting: Cardiovascular Disease

## 2017-04-02 ENCOUNTER — Ambulatory Visit: Payer: Medicare Other | Admitting: Sports Medicine

## 2017-04-09 ENCOUNTER — Encounter: Payer: Self-pay | Admitting: Sports Medicine

## 2017-04-09 ENCOUNTER — Ambulatory Visit (INDEPENDENT_AMBULATORY_CARE_PROVIDER_SITE_OTHER): Payer: Medicare Other | Admitting: Sports Medicine

## 2017-04-09 DIAGNOSIS — I739 Peripheral vascular disease, unspecified: Secondary | ICD-10-CM

## 2017-04-09 DIAGNOSIS — Z8673 Personal history of transient ischemic attack (TIA), and cerebral infarction without residual deficits: Secondary | ICD-10-CM | POA: Diagnosis not present

## 2017-04-09 DIAGNOSIS — M79676 Pain in unspecified toe(s): Secondary | ICD-10-CM

## 2017-04-09 DIAGNOSIS — B351 Tinea unguium: Secondary | ICD-10-CM

## 2017-04-09 DIAGNOSIS — Z7901 Long term (current) use of anticoagulants: Secondary | ICD-10-CM | POA: Diagnosis not present

## 2017-04-09 DIAGNOSIS — E1142 Type 2 diabetes mellitus with diabetic polyneuropathy: Secondary | ICD-10-CM

## 2017-04-09 NOTE — Progress Notes (Signed)
Subjective: Carl Stephenson is a 72 y.o. male patient with history of diabetes who presents to office today complaining of long, painful nails  while ambulating in shoes; unable to trim. Patient states that the glucose reading this morning not recorded and does not recall A1c. Last saw Dr. [name] his PCP 1 week ago. Denies any other acute symptoms are pedal complaints at this time.   Patient Active Problem List   Diagnosis Date Noted  . Weakness 09/07/2016  . Gait abnormality 09/07/2016  . Chronic combined systolic and diastolic CHF (congestive heart failure) ([place]) 02/18/2016  . History of embolic stroke 98/33/8250  . Bradycardia 01/16/2016  . Abnormal EKG   . LBBB (left bundle branch block)   . Coronary artery disease involving native coronary artery of native heart without angina pectoris   . ECG abnormality   . History of CVA (cerebrovascular accident)   . Lactic acidosis   . Convulsions ([place])   . Hypotension 01/16/2014  . CVA (cerebral vascular accident) ([place]) 12/29/2013  . Acute encephalopathy 12/28/2013  . Seborrheic dermatitis 10/04/2012  . Microcytosis 11/07/2011  . Aphasia 06/26/2011  . 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 Cerebral thrombosis with cerebral infarction ([place]) 08/14/2010  . Temporary cerebral vascular dysfunction 10/24/2009  . BENIGN NEOPLASM OF BLADDER 03/16/2008  . Hyperlipidemia 03/16/2008  . Obesity 03/16/2008  . Essential hypertension 03/16/2008  . MYOCARDIAL INFARCTION 03/16/2008  . UNSPECIFIED PAROXYSMAL TACHYCARDIA 03/16/2008  . GERD 03/16/2008  . Diabetes mellitus ([place]) 03/16/2008   Current Outpatient Medications on File Prior to Visit  Medication Sig Dispense Refill  . acetaminophen (TYLENOL) 325 MG tablet Take 2 tablets (650 mg total) by mouth every 4 (four) hours as needed for headache or mild pain. 30 tablet 0  . aspirin EC 81 MG EC tablet Take 1 tablet (81 mg total) by mouth daily. 30 tablet 3  . clopidogrel (PLAVIX) 75 MG  tablet Take 1 tablet (75 mg total) by mouth daily with breakfast. 90 tablet 3  . fexofenadine (ALLEGRA) 180 MG tablet Take 180 mg by mouth daily.    . furosemide (LASIX) 40 MG tablet TAKE 1/2 TABLET BY MOUTH DAILY 45 tablet 2  . insulin detemir (LEVEMIR) 100 UNIT/ML injection Inject 70 Units into the skin daily with supper.    . levETIRAcetam (KEPPRA) 500 MG tablet Take 1 tablet (500 mg total) by mouth 2 (two) times daily. 60 tablet 0  . nitroGLYCERIN (NITROSTAT) 0.4 MG SL tablet PLACE 1 TABLET UNDER THE TONGUE EVERY 5 MINUTES AS NEEDED FOR CHEST PAIN 75 tablet 0  . pantoprazole (PROTONIX) 40 MG tablet Take 40 mg by mouth daily.      . potassium chloride SA (K-DUR,KLOR-CON) 20 MEQ tablet Take 1 tablet (20 mEq total) by mouth daily. 90 tablet 3  . sertraline (ZOLOFT) 100 MG tablet Take 1.5 tablets (150 mg total) by mouth daily. (Patient taking differently: Take 100 mg by mouth daily. ) 90 tablet 3   Current Facility-Administered Medications on File Prior to Visit  Medication Dose Route Frequency Provider Last Rate Last Dose  . heparin infusion 2 units/mL in 0.9 % sodium chloride    Continuous PRN [place], Peter M, MD   1,000 mL at 01/15/16 0514  . iopamidol (ISOVUE-370) 76 % injection    PRN [place], Peter M, MD   105 mL at 01/15/16 0514  . lidocaine (PF) (XYLOCAINE) 1 % injection    PRN [place], Peter M, MD   2 mL at 01/15/16 0514  .  Radial Cocktail/Verapamil only    PRN [place], Peter M, MD   10 mL at 01/15/16 0451   Allergies  Allergen Reactions  . Statins Nausea Only    mylgia  . Atorvastatin Other (See Comments)    Leg weakness  . Nitrofurantoin Monohyd Macro Hives and Rash    Recent Results (from the past 2160 hour(s))  CUP PACEART REMOTE DEVICE CHECK     Status: None   Collection Time: 01/14/17 11:12 PM  Result Value Ref Range   Date Time Interrogation Session [phone]    Pulse Generator Manufacturer MERM    Pulse Gen Model [id number] Reveal LINQ    Pulse Gen Serial Number  [id number]    Clinic Name [facility]    Implantable Pulse Generator Type ICM/ILR    Implantable Pulse Generator Implant Date [id number]    Eval Rhythm SB with PVCs   Basic metabolic panel     Status: Abnormal   Collection Time: 02/09/17  2:20 PM  Result Value Ref Range   Glucose 142 (H) 65 - 99 mg/dL   BUN 19 8 - 27 mg/dL   Creatinine, Ser 1.24 0.76 - 1.27 mg/dL   GFR calc non Af Amer 58 (L) >59 mL/min/1.73   GFR calc Af Amer 67 >59 mL/min/1.73   BUN/Creatinine Ratio 15 10 - 24   Sodium 142 134 - 144 mmol/L   Potassium 4.3 3.5 - 5.2 mmol/L   Chloride 103 96 - 106 mmol/L   CO2 25 20 - 29 mmol/L   Calcium 9.0 8.6 - 10.2 mg/dL  CBC     Status: Abnormal   Collection Time: 02/09/17  2:20 PM  Result Value Ref Range   WBC 10.5 3.4 - 10.8 x10E3/uL   RBC 5.36 4.14 - 5.80 x10E6/uL   Hemoglobin 14.1 13.0 - 17.7 g/dL   Hematocrit 41.5 37.5 - 51.0 %   MCV 77 (L) 79 - 97 fL   MCH 26.3 (L) 26.6 - 33.0 pg   MCHC 34.0 31.5 - 35.7 g/dL   RDW 15.1 12.3 - 15.4 %   Platelets 190 150 - 379 x10E3/uL  Hepatic function panel     Status: None   Collection Time: 02/09/17  2:20 PM  Result Value Ref Range   Total Protein 6.0 6.0 - 8.5 g/dL   Albumin 3.7 3.5 - 4.8 g/dL   Bilirubin Total 0.4 0.0 - 1.2 mg/dL   Bilirubin, Direct 0.11 0.00 - 0.40 mg/dL   Alkaline Phosphatase 87 39 - 117 IU/L   AST 17 0 - 40 IU/L   ALT 13 0 - 44 IU/L  Lipid panel     Status: Abnormal   Collection Time: 02/09/17  2:20 PM  Result Value Ref Range   Cholesterol, Total 231 (H) 100 - 199 mg/dL   Triglycerides 388 (H) 0 - 149 mg/dL   HDL 30 (L) >39 mg/dL   VLDL Cholesterol Cal 78 (H) 5 - 40 mg/dL   LDL Calculated 123 (H) 0 - 99 mg/dL   Chol/HDL Ratio 7.7 (H) 0.0 - 5.0 ratio    Comment:                                   T. Chol/HDL Ratio  Men  Women                               1/2 Avg.Risk  3.4    3.3                                   Avg.Risk  5.0    4.4                                 2X Avg.Risk  9.6    7.1                                3X Avg.Risk 23.4   11.0   CUP PACEART REMOTE DEVICE CHECK     Status: None   Collection Time: 02/15/17 11:40 PM  Result Value Ref Range   Date Time Interrogation Session [id number]    Pulse Generator Manufacturer MERM    Pulse Gen Model [id number] Reveal LINQ    Pulse Gen Serial Number [id number]    Clinic Name [facility]    Implantable Pulse Generator Type ICM/ILR    Implantable Pulse Generator Implant Date [id number]    Eval Rhythm SB     Objective: General: Patient is awake, alert, and oriented x 3 and in no acute distress.Walker-assisted gait.  Integument: Skin is warm, dry and supple bilateral. Nails are tender, long, thickened and dystrophic with subungual debris, consistent with onychomycosis, 1-5 bilateral. No signs of infection. No open lesions or preulcerative lesions present bilateral. Remaining integument unremarkable.  Vasculature:  Dorsalis Pedis pulse 1/4 bilateral. Posterior Tibial pulse  0/4 bilateralDue to 1+ pitting edema at ankles. Capillary fill time <3 sec 1-5 bilateral. Scant hair growth to the level of the digits.Temperature gradient within normal limits. Mild varicosities present bilateral.  Neurology: The patient has diminished sensation measured with a 5.07/10g Semmes Weinstein Monofilament at all pedal sites bilateral . Vibratory sensation diminished bilateral with tuning fork. No Babinski sign present bilateral.   Musculoskeletal: Asymptomatic pes planus and mild hammertoe pedal deformities noted bilateral. Muscular strength 4/5 in all lower extremity muscular groups bilateral without pain on range of motion, history of stroke. No tenderness with calf compression bilateral.  Assessment and Plan: Problem List Items Addressed This Visit    None    Visit Diagnoses    Pain due to onychomycosis of toenail    -  Primary   Diabetic polyneuropathy associated with type 2 diabetes mellitus  (HCC)       Anticoagulant long-term use       History of stroke       PVD (peripheral vascular disease) ([place])         -Examined patient. -Discussed and educated patient on diabetic foot care, especially with  regards to the vascular, neurological and musculoskeletal systems.  -Stressed the importance of good glycemic control and the detriment of not controlling glucose levels in relation to the foot. -Mechanically debrided all nails 1-5 bilateral using sterile nail nipper and filed with dremel without incident  -Encouraged elevation of legs to assist with edema control -Patient to return  in 3 months for at risk foot care -Patient advised to call the office if any problems or questions arise in the meantime.  Landis Martins, DPM

## 2017-04-20 ENCOUNTER — Ambulatory Visit (INDEPENDENT_AMBULATORY_CARE_PROVIDER_SITE_OTHER): Payer: Medicare Other | Admitting: *Deleted

## 2017-04-20 DIAGNOSIS — I6389 Other cerebral infarction: Secondary | ICD-10-CM

## 2017-04-20 NOTE — Progress Notes (Signed)
[facility]  PATIENT: Carl Stephenson DOB: 1945/06/21   REASON FOR VISIT: Follow-up for history of stroke and recurrent stroke in July 2018 HISTORY FROM: Patient  and daughter    HISTORY OF PRESENT ILLNESS: 4/10/2019CM Carl Stephenson 72 year old, male returns for follow-up with history of stroke event in July 2018 and previous strokes.  He is currently on Plavix and aspirin with minimal bruising and no bleeding.  He has not had further stroke or TIA symptoms.  He is currently receiving home health PT OT.  He continues to use a walker.  He denies any falls.  He remains on Keppra without further Stephenson activity.  Blood pressure in the office today 140/70.  He remains on insulin, most recent A1c 7.  He has a statin intolerance and PCS 9 inhibitor has been recommended.  He is to follow-up with the [place] in [place] regarding this.  His primary physician is Carl Stephenson in [place].  Patient is also recently reduced to 100 mg daily for depression.  Loop recorder so far no atrial fibrillation.  He returns for reevaluation of depression on Zoloft    Update 10/9/2018PS : He returns for follow-up after last visit with the Carl Stephenson, nurse practitioner on 09/07/16. He is accompanied by his mother and daughter. Patient states is doing well is finished home physical and occupation therapy 2 weeks ago. He is walking with a walker he states his balance is good his had no falls. He is tolerating aspirin and Plavix well with only minor bruising and no bleeding. His blood pressure is usually well controlled and today it is elevated in office at 153/73. His sugar is doing better and lasted about an A1c was down to 6.8. He has seen his primary physician Carl Stephenson who is considering recommending the new PCS 9 inhibitor injections for his statin intolerance. Patient is taking Zoloft 100 mg daily for depression but the family feels his depression is not yet optimally controlled. I have personally  reviewed his hospitalization records from Carl Stephenson for a stroke in July 2018. MRI scan of the brain on 08/05/16 shows small acute infarct in the right paramedian pons as well as small late subacute infarct in the left parietal white matter with extensive changes of chronic small vessel disease CT angiogram of the brain showed no emergent large vessel occlusion but moderate multifocal intracranial atherosclerotic changes involving distal right vertebral artery, proximal left posterior cerebral artery and mid right posterior cerebral artery as well as from small right M1 middle cerebral artery stenosis.     UPDATE 08/27/2018CM Carl Stephenson, 72 year old male returns for follow-up. He was last seen in this office by Carl Stephenson in February for stroke and Stephenson. Since that time he had a admission to [facility] in July for stroke and spent a month in [place] home for rehabilitation. He is now getting physical therapy and occupational therapy at home. We do not have any information from his [place] hospitalization. He remains on Plavix and aspirin for secondary stroke prevention. He has not had further stroke or TIA symptoms since his most recent admission. He remains on Keppra for Stephenson prophylaxis is on Crestor for hyperlipidemia without complaints of myalgias . Blood pressure the office today 118/78. Loop recorder without evidence of atrial fibrillation. He returns for reevaluation 03/09/16 Carl Stephenson is a 28 year caucasian male seen today for the first office follow-up visit following hospital admission for Stephenson and stroke in January 2018. He is accompanied  by his wife and daughter. I have personally reviewed the hospital admission summary, imaging studies and evaluation.Carl Stephenson, head trauma, or febrile Stephenson. 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 Family and wife, patient had gotten up to go to the bathroom when suddenly he was noticed to fall down with both arms extended andlegs extended, eyes rolling back, foaming at the mouth and bleeding at the mouth. Per wife this lasted for approximately 15 minutes and then patient was postictal and confused afterwards. EMS was called and patient was brought to the ED. On arrival to Carl Stephenson was taken to the cardiac cath lab which showed two-vessel obstructive CAD with diffuse mild LAD disease and diffuse disease in the first OM, multiple stents patent, and moderately elevated LVEDP.After discussing with family patient it was noted that  did not smoke, patient does not drink, patient did not drink on New Year's Eve, patient does not do any illicit drugs. Patient does have issues with fluid behind the eyes and now having some disconjugate gaze twitches was to see a neurologist in the future for an MRI. As stated patient has never had a Stephenson in the past. Currently patient is alert, he is able to come ease at the hospital but does not have any recollection of what happened. He is having some difficulty following commands secondary to postictal state. EEG was obtained which did not show any overt Stephenson activity however formal reading is pending . Patient  only had 1 Stephenson .Marland KitchenPatient was not considered for IV t-PA secondary to non-stroke presentation.  MRI scan of the brain showed 4 small punctate infarcts in the right MCA, right PCN right cerebellar territories. MRI of the brain showed posterior separation atherosclerotic moderate changes involving distal right vertebral artery only. Transthoracic echo showed normal ejection fraction with slight diminished 45-50% ejection and diffuse hypokinesis. EEG showed moderate diffuse slowing and focal slowingwith additional  focal slowing over the right hemispheire. LDL cholesterol was elevated at 140. Hemoglobin A1c was 7.7. Patient was started on Keppra 500 twice daily. He states his drinking while he has had no recurrent seizures or stroke or TIA symptoms. His meds are full physical recovery but does feel tired and sleepy a lot. He is currently on Keppra 500 twice daily. The family is concerned and wonders if he needs to stay on it. His blood pressure is well controlled today it is 136/61. His fasting sugars yet ranging in the 140 range. Patient has been eating healthy he states his activity has not had any weight loss yet. He is on aspirin and Plavix and tolerating it well with only minor bruising and no bleeding. Patient had not been driving but is interested in driving soon.    REVIEW OF SYSTEMS: Full 14 system review of systems performed and notable only for those listed, all others are neg:  Constitutional: Fatigue  Cardiovascular: neg Ear/Nose/Throat: neg  Skin: neg Eyes: neg Respiratory: neg Gastroitestinal: neg  Hematology/Lymphatic: Easy bruising  Endocrine: neg Musculoskeletal: Walking difficulty Allergy/Immunology: neg Neurological: Memory loss Psychiatric: Depression on Zoloft Sleep : neg   ALLERGIES: Allergies  Allergen Reactions  . Statins Nausea Only    mylgia  . Atorvastatin Other (See Comments)  Leg weakness  . Nitrofurantoin Monohyd Macro Hives and Rash    HOME MEDICATIONS: Outpatient Medications Prior to Visit  Medication Sig Dispense Refill  . acetaminophen (TYLENOL) 325 MG tablet Take 2 tablets (650 mg total) by mouth every 4 (four) hours as needed for headache or mild pain. 30 tablet 0  . aspirin EC 81 MG EC tablet Take 1 tablet (81 mg total) by mouth daily. 30 tablet 3  . clopidogrel (PLAVIX) 75 MG tablet Take 1 tablet (75 mg total) by mouth daily with breakfast. 90 tablet 3  . fexofenadine (ALLEGRA) 180 MG tablet Take 180 mg by mouth daily.    . furosemide (LASIX) 40 MG  tablet TAKE 1/2 TABLET BY MOUTH DAILY 45 tablet 2  . insulin detemir (LEVEMIR) 100 UNIT/ML injection Inject 70 Units into the skin daily with supper.    . levETIRAcetam (KEPPRA) 500 MG tablet Take 1 tablet (500 mg total) by mouth 2 (two) times daily. 60 tablet 0  . nitroGLYCERIN (NITROSTAT) 0.4 MG SL tablet PLACE 1 TABLET UNDER THE TONGUE EVERY 5 MINUTES AS NEEDED FOR CHEST PAIN 75 tablet 0  . pantoprazole (PROTONIX) 40 MG tablet Take 40 mg by mouth daily.      . potassium chloride SA (K-DUR,KLOR-CON) 20 MEQ tablet Take 1 tablet (20 mEq total) by mouth daily. 90 tablet 3  . sertraline (ZOLOFT) 100 MG tablet Take 1.5 tablets (150 mg total) by mouth daily. (Patient taking differently: Take 100 mg by mouth daily. ) 90 tablet 3   Facility-Administered Medications Prior to Visit  Medication Dose Route Frequency Provider Last Rate Last Dose  . heparin infusion 2 units/mL in 0.9 % sodium chloride    Continuous PRN Martinique, Peter M, MD   1,000 mL at 01/15/16 0514  . iopamidol (ISOVUE-370) 76 % injection    PRN Martinique, Peter M, MD   105 mL at 01/15/16 0514  . lidocaine (PF) (XYLOCAINE) 1 % injection    PRN Martinique, Peter M, MD   2 mL at 01/15/16 0514  . Radial Cocktail/Verapamil only    PRN Martinique, Peter M, MD   10 mL at 01/15/16 0451    PAST MEDICAL HISTORY: Past Medical History:  Diagnosis Date  . Acute encephalopathy admitted 12/28/2013  . Bladder tumor    Recurrent  . Bradycardia 01/16/2016  . Cancer [facility])    "had TURBT; cancer free since ~ 11/2013/MD" (12/27/2013)  . Carotid artery disease ([place])    [place] 12/15: Bilateral ICA 1-39 // [place] 1/18: Bilateral ICA 1-39  . Chronic lower back pain   . Coronary artery disease    a. Cath 12/27/2013 EF 35-40%, 80-90% prox LAD treated with balloon angioplasty and 3.0x28 mm Xience DES, LCx with anomalous origin, 50-60% stenosis in OM, 50% stenosis in PDA and PLA // b. [place] 1/18: mLAD stent ok, dLAD 80, RI stent ok, oLCx stent ok, OM1 85, oRCA stent ok, mRA 40, dRCA  40, RPDA 60/60 >> med Rx  . Diabetes mellitus type II   . GERD (gastroesophageal reflux disease)   . History of echocardiogram    Echo 12/15: Mild LVH, EF 45-50, inf HK, Gr 1 diastolic dysfunction  // Echo 1/18: Moderate LVH, EF 45-50, diffuse HK, mild LAE, RA upper limits of normal // TEE 1/18:  EF 45, diffuse HK, frequent PVCs, trivial MR, mild LAE, normal RVSF, no LA or RA clot, + PFO   . History of hiatal hernia   . Hyperlipidemia   . Hypertension   .  Inferior myocardial infarction ([place]) 1994   Inferior, PCI and Streptokinase  . Kidney stones    "passed them all" (12/27/2013)  . Obesity   . 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 Stroke [facility]) ~ 2012; 2018   multiple strokes // embolic CVA 0/2585 >> admx with encephalopathy >> s/p ILR  . TIA (transient ischemic attack) "several"  . Tobacco user    Quit 1994  . Wide-complex tachycardia ([place])     PAST SURGICAL HISTORY: Past Surgical History:  Procedure Laterality Date  . CARDIAC CATHETERIZATION     "he's had a couple before today where they just looked around" (12/27/2013)  . CARDIAC CATHETERIZATION N/A 01/15/2016   Procedure: Left Heart Cath and Coronary Angiography;  Surgeon: Peter M Martinique, MD;  Location: [facility];  Service: Cardiovascular;  Laterality: N/A;  . CORONARY ANGIOPLASTY WITH STENT PLACEMENT     "he's had 3-4 stents put in before today"   . CORONARY ANGIOPLASTY WITH STENT PLACEMENT  12/27/2013   "1"  . EP IMPLANTABLE DEVICE N/A 01/20/2016   Procedure: Loop Recorder Insertion;  Surgeon: Evans Lance, MD;  Location: [facility];  Service: Cardiovascular;  Laterality: N/A;  . LEFT HEART CATHETERIZATION WITH CORONARY ANGIOGRAM N/A 10/02/2011   Procedure: LEFT HEART CATHETERIZATION WITH CORONARY ANGIOGRAM;  Surgeon: [name] Bow, MD;  Location: [facility] CATH LAB;  Service: Cardiovascular;  Laterality: N/A;  . LEFT HEART CATHETERIZATION WITH CORONARY ANGIOGRAM N/A 12/27/2013    Procedure: LEFT HEART CATHETERIZATION WITH CORONARY ANGIOGRAM;  Surgeon: Blane Ohara, MD;  Location: [facility] C[facility];  Service: Cardiovascular;  Laterality: N/A;  . TEE WITHOUT CARDIOVERSION N/A 01/20/2016   Procedure: TRANSESOPHAGEAL ECHOCARDIOGRAM (TEE);  Surgeon: Larey Dresser, MD;  Location: [facility];  Service: Cardiovascular;  Laterality: N/A;  . TRANSURETHRAL RESECTION OF BLADDER TUMOR WITH GYRUS (TURBT-GYRUS)  "several times"    FAMILY HISTORY: Family History  Problem Relation Age of Onset  . Coronary artery disease Other   . Heart failure Father   . Heart disease Father     SOCIAL HISTORY: Social History   Socioeconomic History  . Marital status: Married    Spouse name: Not on file  . Number of children: Not on file  . Years of education: Not on file  . Highest education level: Not on file  Occupational History  . 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 Financial resource strain: Not on file  . Food insecurity:    Worry: Not on file    Inability: Not on file  . Transportation needs:    Medical: Not on file    Non-medical: Not on file  Tobacco Use  . Smoking status: Former Smoker    Packs/day: 3.00    Years: 22.00    Pack years: 66.00    Types: Cigarettes    Last attempt to quit: 08/12/1992    Years since quitting: 24.7  . Smokeless tobacco: Never Used  Substance and Sexual Activity  . Alcohol use: No  . Drug use: No  . Sexual activity: Not on file  Lifestyle  . Physical activity:    Days per week: Not on file    Minutes per session: Not on file  . Stress: Not on file  Relationships  . Social connections:    Talks on phone: Not on file    Gets together: Not on file    Attends  religious service: Not on file    Active member of club or organization: Not on file    Attends meetings of clubs or organizations: Not on file    Relationship status: Not on file  . Intimate partner violence:    Fear of  current or ex partner: Not on file    Emotionally abused: Not on file    Physically abused: Not on file    Forced sexual activity: Not on file  Other Topics Concern  . Not on file  Social History Narrative   Married    2 grown children     PHYSICAL EXAM  Vitals:   04/21/17 1500  BP: (!) 140/70   Pulse: (!) 55  Weight: 185 lb 3.2 oz (84 kg)  Height: 5\' 6"  (1.676 m)   Body mass index is 29.89 kg/m.  Generalized: Well developed, Mildly obese male in no acute distress  Head: normocephalic and atraumatic,. Oropharynx benign  Neck: Supple, no carotid bruits  Cardiac: Regular rate rhythm, no murmur  Musculoskeletal: No deformity   Neurological examination   Mentation: Alert oriented to time, place, history taking. Attention span and concentration appropriate. Recent and remote memory intact.  Follows all commands speech and language fluent.   Cranial nerve II-XII: .Pupils were equal round reactive to light extraocular movements were full, visual field were full on confrontational test. Mild left facial weakness . hearing was intact to finger rubbing bilaterally. Uvula tongue midline. head turning and shoulder shrug were normal and symmetric.Tongue protrusion into cheek strength was normal. Motor: normal bulk and tone, full strength in the BUE, BLE, on the right, 4 out of 5 left upper and left lower extremity .  Increased tone in the left lower extremity. Sensory: normal and symmetric to light touch, pinprick, and  Vibration, in the upper and lower extremities  Coordination: finger-nose-finger, no dysmetria Reflexes: 1+ upper lower and symmetric plantar responses were flexor bilaterally. Gait and Station: Rising up from seated position with push off. Ambulates short distance in the hall with rolling walker , drags his left foot, unable to heel toe or tandem.  No difficulty with turns  DIAGNOSTIC DATA (LABS, IMAGING, TESTING) - I reviewed patient records, labs, notes, testing and  imaging myself where available.  Lab Results  Component Value Date   WBC 10.5 02/09/2017   HGB 14.1 02/09/2017   HCT 41.5 02/09/2017   MCV 77 (L) 02/09/2017   PLT 190 02/09/2017      Component Value Date/Time   NA 142 02/09/2017 1420   K 4.3 02/09/2017 1420   CL 103 02/09/2017 1420   CO2 25 02/09/2017 1420   GLUCOSE 142 (H) 02/09/2017 1420   GLUCOSE 183 (H) 01/20/2016 0424   BUN 19 02/09/2017 1420   CREATININE 1.24 02/09/2017 1420   CREATININE 1.00 09/25/2011 1648   CALCIUM 9.0 02/09/2017 1420   PROT 6.0 02/09/2017 1420   ALBUMIN 3.7 02/09/2017 1420   AST 17 02/09/2017 1420   ALT 13 02/09/2017 1420   ALKPHOS 87 02/09/2017 1420   BILITOT 0.4 02/09/2017 1420   GFRNONAA 58 (L) 02/09/2017 1420   GFRAA 67 02/09/2017 1420   Lab Results  Component Value Date   CHOL 231 (H) 02/09/2017   HDL 30 (L) 02/09/2017   LDLCALC 123 (H) 02/09/2017   TRIG 388 (H) 02/09/2017   CHOLHDL 7.7 (H) 02/09/2017   Lab Results  Component Value Date   HGBA1C 7.7 (H) 01/16/2016   No results found for: [id number] Lab Results  Component Value Date   TSH 1.070 12/28/2013      ASSESSMENT AND PLAN 59 year Caucasian male with small right anterior and posterior circulation embolic infarcts in January 2018 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 The strokes occurred after cardiac catheterization procedure. Admission to [facility] July 4,2018 recurrent  Lacunar infarcts due to small vessel disease but he doesn't have large vessel intracranial atherosclerosis as well. Multiple vascular risk factors of diabetes, hypertension, hyperlipidemia and age   PLAN: Stressed the importance of management of risk factors to prevent further stroke Continue Plavix and aspirin for secondary stroke prevention Maintain strict control of hypertension with blood pressure goal below 130/90, today's reading 140/70 Control of diabetes with hemoglobin A1c below 6.5 followed by primary care continue insulin    Cholesterol with LDL cholesterol less than 70, followed by primary care,  He may also benefit with prescription for the new PCS K9 inhibitor injections like Praluent or Repatha for his statin intolerance  and I advised him to see his primary care physician or the [place]  for the same.  Continue PT and OT rehab and HEP  Use walker for safe ambulation Continue Zoloft for depression Continue Keppra for Stephenson prophylaxis Loop recorder without evidence of atrial fibrillation F/U in 6 months I spent 25 minutes in total face to face time with the patient more than 50% of which was spent counseling and coordination of care, reviewing test results reviewing medications and discussing and reviewing the diagnosis of stroke and management of risk factors and further treatment options. , Rayburn Ma, [place], APRN  [facility] [address], [place], [place] [address] [phone]

## 2017-04-21 ENCOUNTER — Encounter: Payer: Self-pay | Admitting: Nurse Practitioner

## 2017-04-21 ENCOUNTER — Ambulatory Visit (INDEPENDENT_AMBULATORY_CARE_PROVIDER_SITE_OTHER): Payer: Medicare Other | Admitting: Nurse Practitioner

## 2017-04-21 VITALS — BP 140/70 | HR 55 | Ht 66.0 in | Wt 185.2 lb

## 2017-04-21 DIAGNOSIS — R269 Unspecified abnormalities of gait and mobility: Secondary | ICD-10-CM | POA: Diagnosis not present

## 2017-04-21 DIAGNOSIS — I6330 Cerebral infarction due to thrombosis of unspecified cerebral artery: Secondary | ICD-10-CM

## 2017-04-21 DIAGNOSIS — R531 Weakness: Secondary | ICD-10-CM | POA: Diagnosis not present

## 2017-04-21 DIAGNOSIS — I10 Essential (primary) hypertension: Secondary | ICD-10-CM

## 2017-04-21 DIAGNOSIS — E7800 Pure hypercholesterolemia, unspecified: Secondary | ICD-10-CM

## 2017-04-21 NOTE — Progress Notes (Signed)
Carelink Summary Report / Loop Recorder 

## 2017-04-21 NOTE — Patient Instructions (Signed)
Continue Plavix and aspirin for secondary stroke prevention Maintain strict control of hypertension with blood pressure goal below 130/90, today's reading 140/70 Control of diabetes with hemoglobin A1c below 6.5 followed by primary care continue insulin  Cholesterol with LDL cholesterol less than 70, followed by primary care,  He may also benefit with prescription for the new PCS K9 inhibitor injections like Praluent or Repatha for his statin intolerance  and I advised him to see his primary care physician or the [facility]  for the same.  Continue PT and OT rehab and HEP  Use walker for safe ambulation Continue Zoloft for depression Continue Keppra for seizure prophylaxis Loop recorder without evidence of atrial fibrillation F/U in 6 months

## 2017-04-29 LAB — CUP PACEART REMOTE DEVICE CHECK
Date Time Interrogation Session: 20190308012753
MDC IDC PG IMPLANT DT: 20180108

## 2017-05-20 ENCOUNTER — Other Ambulatory Visit: Payer: Self-pay | Admitting: Internal Medicine

## 2017-05-24 ENCOUNTER — Ambulatory Visit (INDEPENDENT_AMBULATORY_CARE_PROVIDER_SITE_OTHER): Payer: Medicare Other | Admitting: *Deleted

## 2017-05-24 DIAGNOSIS — I6389 Other cerebral infarction: Secondary | ICD-10-CM

## 2017-05-24 LAB — CUP PACEART REMOTE DEVICE CHECK
Implantable Pulse Generator Implant Date: 20180108
MDC IDC SESS DTM: 20190410013940

## 2017-05-25 NOTE — Progress Notes (Signed)
Carelink Summary Report / Loop Recorder 

## 2017-06-03 ENCOUNTER — Inpatient Hospital Stay (HOSPITAL_COMMUNITY)
Admission: EM | Admit: 2017-06-03 | Discharge: 2017-06-07 | DRG: 065 | Disposition: A | Discharge status: Skilled Nursing Facility | Payer: Medicare Other | Attending: Student in an Organized Health Care Education/Training Program | Admitting: Student in an Organized Health Care Education/Training Program

## 2017-06-03 DIAGNOSIS — J9811 Atelectasis: Secondary | ICD-10-CM | POA: Diagnosis not present

## 2017-06-03 DIAGNOSIS — J811 Chronic pulmonary edema: Secondary | ICD-10-CM | POA: Diagnosis not present

## 2017-06-03 DIAGNOSIS — I6789 Other cerebrovascular disease: Secondary | ICD-10-CM | POA: Diagnosis not present

## 2017-06-03 DIAGNOSIS — R531 Weakness: Secondary | ICD-10-CM | POA: Diagnosis not present

## 2017-06-03 DIAGNOSIS — R1312 Dysphagia, oropharyngeal phase: Secondary | ICD-10-CM | POA: Diagnosis present

## 2017-06-03 DIAGNOSIS — I63529 Cerebral infarction due to unspecified occlusion or stenosis of unspecified anterior cerebral artery: Secondary | ICD-10-CM | POA: Diagnosis not present

## 2017-06-03 DIAGNOSIS — Z8249 Family history of ischemic heart disease and other diseases of the circulatory system: Secondary | ICD-10-CM

## 2017-06-03 DIAGNOSIS — Z7902 Long term (current) use of antithrombotics/antiplatelets: Secondary | ICD-10-CM

## 2017-06-03 DIAGNOSIS — Z8673 Personal history of transient ischemic attack (TIA), and cerebral infarction without residual deficits: Secondary | ICD-10-CM

## 2017-06-03 DIAGNOSIS — I2510 Atherosclerotic heart disease of native coronary artery without angina pectoris: Secondary | ICD-10-CM | POA: Diagnosis not present

## 2017-06-03 DIAGNOSIS — K219 Gastro-esophageal reflux disease without esophagitis: Secondary | ICD-10-CM | POA: Diagnosis present

## 2017-06-03 DIAGNOSIS — I5042 Chronic combined systolic (congestive) and diastolic (congestive) heart failure: Secondary | ICD-10-CM | POA: Diagnosis not present

## 2017-06-03 DIAGNOSIS — I639 Cerebral infarction, unspecified: Secondary | ICD-10-CM | POA: Diagnosis not present

## 2017-06-03 DIAGNOSIS — J432 Centrilobular emphysema: Secondary | ICD-10-CM | POA: Diagnosis present

## 2017-06-03 DIAGNOSIS — Z8551 Personal history of malignant neoplasm of bladder: Secondary | ICD-10-CM

## 2017-06-03 DIAGNOSIS — F329 Major depressive disorder, single episode, unspecified: Secondary | ICD-10-CM | POA: Diagnosis present

## 2017-06-03 DIAGNOSIS — E669 Obesity, unspecified: Secondary | ICD-10-CM | POA: Diagnosis present

## 2017-06-03 DIAGNOSIS — I480 Paroxysmal atrial fibrillation: Secondary | ICD-10-CM | POA: Diagnosis present

## 2017-06-03 DIAGNOSIS — I110 Hypertensive heart disease with heart failure: Secondary | ICD-10-CM | POA: Diagnosis present

## 2017-06-03 DIAGNOSIS — I69354 Hemiplegia and hemiparesis following cerebral infarction affecting left non-dominant side: Secondary | ICD-10-CM | POA: Diagnosis not present

## 2017-06-03 DIAGNOSIS — M47812 Spondylosis without myelopathy or radiculopathy, cervical region: Secondary | ICD-10-CM | POA: Diagnosis present

## 2017-06-03 DIAGNOSIS — I10 Essential (primary) hypertension: Secondary | ICD-10-CM | POA: Diagnosis present

## 2017-06-03 DIAGNOSIS — Z7982 Long term (current) use of aspirin: Secondary | ICD-10-CM

## 2017-06-03 DIAGNOSIS — R569 Unspecified convulsions: Secondary | ICD-10-CM

## 2017-06-03 DIAGNOSIS — E119 Type 2 diabetes mellitus without complications: Secondary | ICD-10-CM

## 2017-06-03 DIAGNOSIS — Z79899 Other long term (current) drug therapy: Secondary | ICD-10-CM

## 2017-06-03 DIAGNOSIS — E785 Hyperlipidemia, unspecified: Secondary | ICD-10-CM | POA: Diagnosis present

## 2017-06-03 DIAGNOSIS — Z794 Long term (current) use of insulin: Secondary | ICD-10-CM

## 2017-06-03 DIAGNOSIS — Z9861 Coronary angioplasty status: Secondary | ICD-10-CM

## 2017-06-03 DIAGNOSIS — Z87891 Personal history of nicotine dependence: Secondary | ICD-10-CM

## 2017-06-03 DIAGNOSIS — Z87442 Personal history of urinary calculi: Secondary | ICD-10-CM

## 2017-06-03 DIAGNOSIS — G40909 Epilepsy, unspecified, not intractable, without status epilepticus: Secondary | ICD-10-CM | POA: Diagnosis present

## 2017-06-03 DIAGNOSIS — I69398 Other sequelae of cerebral infarction: Secondary | ICD-10-CM

## 2017-06-03 DIAGNOSIS — Z888 Allergy status to other drugs, medicaments and biological substances status: Secondary | ICD-10-CM

## 2017-06-03 DIAGNOSIS — I252 Old myocardial infarction: Secondary | ICD-10-CM

## 2017-06-03 DIAGNOSIS — I6340 Cerebral infarction due to embolism of unspecified cerebral artery: Secondary | ICD-10-CM

## 2017-06-03 DIAGNOSIS — Z7901 Long term (current) use of anticoagulants: Secondary | ICD-10-CM

## 2017-06-03 DIAGNOSIS — Z6829 Body mass index (BMI) 29.0-29.9, adult: Secondary | ICD-10-CM

## 2017-06-03 LAB — I-STAT TROPONIN, ED: Troponin i, poc: 0.02 ng/mL (ref 0.00–0.08)

## 2017-06-03 NOTE — ED Triage Notes (Signed)
Pt arrived via [employer] after family called EMS for weakness and inability to move self from commode and was leaning to the right side. Pt has hx of stroke with left sided residual affect. Pt has hx of MI. EMS reported several episodes of bradycardia (20-30bpm) lasting roughly 20 seconds. Pt was asymptomatic during times of bradycardia. Pt resumed regular rate of 70-80bpm thereafter. Pt is alert and oriented but slow to respond to questions at time of triage. Pt has hx of seizures. Family reports no recent falls.

## 2017-06-04 ENCOUNTER — Inpatient Hospital Stay (HOSPITAL_COMMUNITY): Payer: Medicare Other

## 2017-06-04 ENCOUNTER — Other Ambulatory Visit: Payer: Self-pay

## 2017-06-04 ENCOUNTER — Encounter (HOSPITAL_COMMUNITY): Payer: Self-pay | Admitting: General Practice

## 2017-06-04 ENCOUNTER — Emergency Department (HOSPITAL_COMMUNITY): Payer: Medicare Other

## 2017-06-04 DIAGNOSIS — Z87442 Personal history of urinary calculi: Secondary | ICD-10-CM | POA: Diagnosis not present

## 2017-06-04 DIAGNOSIS — F329 Major depressive disorder, single episode, unspecified: Secondary | ICD-10-CM | POA: Diagnosis not present

## 2017-06-04 DIAGNOSIS — I69398 Other sequelae of cerebral infarction: Secondary | ICD-10-CM | POA: Diagnosis not present

## 2017-06-04 DIAGNOSIS — M47812 Spondylosis without myelopathy or radiculopathy, cervical region: Secondary | ICD-10-CM | POA: Diagnosis present

## 2017-06-04 DIAGNOSIS — J9811 Atelectasis: Secondary | ICD-10-CM | POA: Diagnosis present

## 2017-06-04 DIAGNOSIS — I6340 Cerebral infarction due to embolism of unspecified cerebral artery: Secondary | ICD-10-CM | POA: Diagnosis not present

## 2017-06-04 DIAGNOSIS — E119 Type 2 diabetes mellitus without complications: Secondary | ICD-10-CM | POA: Diagnosis present

## 2017-06-04 DIAGNOSIS — E1159 Type 2 diabetes mellitus with other circulatory complications: Secondary | ICD-10-CM | POA: Diagnosis not present

## 2017-06-04 DIAGNOSIS — I63529 Cerebral infarction due to unspecified occlusion or stenosis of unspecified anterior cerebral artery: Secondary | ICD-10-CM | POA: Diagnosis present

## 2017-06-04 DIAGNOSIS — I110 Hypertensive heart disease with heart failure: Secondary | ICD-10-CM | POA: Diagnosis not present

## 2017-06-04 DIAGNOSIS — G40909 Epilepsy, unspecified, not intractable, without status epilepticus: Secondary | ICD-10-CM | POA: Diagnosis not present

## 2017-06-04 DIAGNOSIS — Z7902 Long term (current) use of antithrombotics/antiplatelets: Secondary | ICD-10-CM | POA: Diagnosis not present

## 2017-06-04 DIAGNOSIS — I480 Paroxysmal atrial fibrillation: Secondary | ICD-10-CM | POA: Diagnosis present

## 2017-06-04 DIAGNOSIS — Z7982 Long term (current) use of aspirin: Secondary | ICD-10-CM | POA: Diagnosis not present

## 2017-06-04 DIAGNOSIS — Z87891 Personal history of nicotine dependence: Secondary | ICD-10-CM | POA: Diagnosis not present

## 2017-06-04 DIAGNOSIS — Z8673 Personal history of transient ischemic attack (TIA), and cerebral infarction without residual deficits: Secondary | ICD-10-CM | POA: Diagnosis not present

## 2017-06-04 DIAGNOSIS — J432 Centrilobular emphysema: Secondary | ICD-10-CM | POA: Diagnosis present

## 2017-06-04 DIAGNOSIS — I10 Essential (primary) hypertension: Secondary | ICD-10-CM | POA: Diagnosis not present

## 2017-06-04 DIAGNOSIS — J811 Chronic pulmonary edema: Secondary | ICD-10-CM | POA: Diagnosis not present

## 2017-06-04 DIAGNOSIS — I639 Cerebral infarction, unspecified: Secondary | ICD-10-CM | POA: Diagnosis not present

## 2017-06-04 DIAGNOSIS — E7800 Pure hypercholesterolemia, unspecified: Secondary | ICD-10-CM

## 2017-06-04 DIAGNOSIS — I69354 Hemiplegia and hemiparesis following cerebral infarction affecting left non-dominant side: Secondary | ICD-10-CM | POA: Diagnosis not present

## 2017-06-04 DIAGNOSIS — I5042 Chronic combined systolic (congestive) and diastolic (congestive) heart failure: Secondary | ICD-10-CM | POA: Diagnosis present

## 2017-06-04 DIAGNOSIS — I5040 Unspecified combined systolic (congestive) and diastolic (congestive) heart failure: Secondary | ICD-10-CM | POA: Diagnosis not present

## 2017-06-04 DIAGNOSIS — Z888 Allergy status to other drugs, medicaments and biological substances status: Secondary | ICD-10-CM | POA: Diagnosis not present

## 2017-06-04 DIAGNOSIS — I2510 Atherosclerotic heart disease of native coronary artery without angina pectoris: Secondary | ICD-10-CM | POA: Diagnosis not present

## 2017-06-04 DIAGNOSIS — Z7901 Long term (current) use of anticoagulants: Secondary | ICD-10-CM | POA: Diagnosis not present

## 2017-06-04 DIAGNOSIS — E785 Hyperlipidemia, unspecified: Secondary | ICD-10-CM | POA: Diagnosis not present

## 2017-06-04 DIAGNOSIS — Z79899 Other long term (current) drug therapy: Secondary | ICD-10-CM | POA: Diagnosis not present

## 2017-06-04 DIAGNOSIS — R1312 Dysphagia, oropharyngeal phase: Secondary | ICD-10-CM | POA: Diagnosis not present

## 2017-06-04 DIAGNOSIS — K219 Gastro-esophageal reflux disease without esophagitis: Secondary | ICD-10-CM | POA: Diagnosis present

## 2017-06-04 DIAGNOSIS — Z794 Long term (current) use of insulin: Secondary | ICD-10-CM | POA: Diagnosis not present

## 2017-06-04 DIAGNOSIS — I252 Old myocardial infarction: Secondary | ICD-10-CM | POA: Diagnosis not present

## 2017-06-04 DIAGNOSIS — I6789 Other cerebrovascular disease: Secondary | ICD-10-CM | POA: Diagnosis not present

## 2017-06-04 DIAGNOSIS — Z8249 Family history of ischemic heart disease and other diseases of the circulatory system: Secondary | ICD-10-CM | POA: Diagnosis not present

## 2017-06-04 DIAGNOSIS — R531 Weakness: Secondary | ICD-10-CM | POA: Diagnosis not present

## 2017-06-04 DIAGNOSIS — R569 Unspecified convulsions: Secondary | ICD-10-CM | POA: Diagnosis not present

## 2017-06-04 LAB — URINALYSIS, ROUTINE W REFLEX MICROSCOPIC
Bacteria, UA: NONE SEEN
Bilirubin Urine: NEGATIVE
Glucose, UA: NEGATIVE mg/dL
Hgb urine dipstick: NEGATIVE
KETONES UR: NEGATIVE mg/dL
Leukocytes, UA: NEGATIVE
Nitrite: NEGATIVE
Specific Gravity, Urine: 1.02 (ref 1.005–1.030)
pH: 6 (ref 5.0–8.0)

## 2017-06-04 LAB — COMPREHENSIVE METABOLIC PANEL
ALBUMIN: 3.3 g/dL — AB (ref 3.5–5.0)
ALK PHOS: 76 U/L (ref 38–126)
ALT: 13 U/L — ABNORMAL LOW (ref 17–63)
ANION GAP: 9 (ref 5–15)
AST: 21 U/L (ref 15–41)
BILIRUBIN TOTAL: 0.8 mg/dL (ref 0.3–1.2)
BUN: 25 mg/dL — ABNORMAL HIGH (ref 6–20)
CO2: 25 mmol/L (ref 22–32)
Calcium: 8.7 mg/dL — ABNORMAL LOW (ref 8.9–10.3)
Chloride: 107 mmol/L (ref 101–111)
Creatinine, Ser: 1.22 mg/dL (ref 0.61–1.24)
GFR, EST NON AFRICAN AMERICAN: 57 mL/min — AB (ref >60)
GLUCOSE: 133 mg/dL — AB (ref 65–99)
Potassium: 4 mmol/L (ref 3.5–5.1)
Sodium: 141 mmol/L (ref 135–145)
TOTAL PROTEIN: 5.8 g/dL — AB (ref 6.5–8.1)

## 2017-06-04 LAB — GLUCOSE, CAPILLARY
Glucose-Capillary: 124 mg/dL — ABNORMAL HIGH (ref 65–99)
Glucose-Capillary: 168 mg/dL — ABNORMAL HIGH (ref 65–99)
Glucose-Capillary: 115 mg/dL — ABNORMAL HIGH (ref 65–99)

## 2017-06-04 LAB — CBC
HEMATOCRIT: 40.6 % (ref 39.0–52.0)
HEMOGLOBIN: 13.2 g/dL (ref 13.0–17.0)
MCH: 26.3 pg (ref 26.0–34.0)
MCHC: 32.5 g/dL (ref 30.0–36.0)
MCV: 80.9 fL (ref 78.0–100.0)
Platelets: 172 10*3/uL (ref 150–400)
RBC: 5.02 MIL/uL (ref 4.22–5.81)
RDW: 14.7 % (ref 11.5–15.5)
WBC: 9.2 10*3/uL (ref 4.0–10.5)

## 2017-06-04 MED ORDER — SERTRALINE HCL 100 MG PO TABS
100.0000 mg | ORAL_TABLET | Freq: Every day | ORAL | Status: DC
  Administered 2017-06-04 – 2017-06-07 (×4): 100 mg via ORAL
  Filled 2017-06-04 (×4): qty 1

## 2017-06-04 MED ORDER — ACETAMINOPHEN 325 MG PO TABS: 650.0000 mg | ORAL_TABLET | ORAL | Status: DC | PRN

## 2017-06-04 MED ORDER — ACETAMINOPHEN 160 MG/5ML PO SOLN: 650.0000 mg | ORAL | Status: DC | PRN

## 2017-06-04 MED ORDER — ASPIRIN EC 81 MG PO TBEC: 81.0000 mg | DELAYED_RELEASE_TABLET | Freq: Every day | ORAL | Status: DC

## 2017-06-04 MED ORDER — ACETAMINOPHEN 650 MG RE SUPP: 650.0000 mg | RECTAL | Status: DC | PRN

## 2017-06-04 MED ORDER — ENOXAPARIN SODIUM 40 MG/0.4ML ~~LOC~~ SOLN: 40.0000 mg | SUBCUTANEOUS | Status: DC

## 2017-06-04 MED ORDER — INSULIN DETEMIR 100 UNIT/ML ~~LOC~~ SOLN
35.0000 [IU] | Freq: Every day | SUBCUTANEOUS | Status: DC
  Administered 2017-06-04 – 2017-06-06 (×2): 35 [IU] via SUBCUTANEOUS
  Filled 2017-06-04 (×4): qty 0.35

## 2017-06-04 MED ORDER — STROKE: EARLY STAGES OF RECOVERY BOOK
Freq: Once | Status: AC
  Administered 2017-06-04: 17:00:00

## 2017-06-04 MED ORDER — CLOPIDOGREL BISULFATE 75 MG PO TABS
75.0000 mg | ORAL_TABLET | Freq: Every day | ORAL | Status: DC
  Administered 2017-06-04: 75 mg via ORAL
  Filled 2017-06-04: qty 1

## 2017-06-04 MED ORDER — ASPIRIN 300 MG RE SUPP: 300.0000 mg | Freq: Every day | RECTAL | Status: DC

## 2017-06-04 MED ORDER — ASPIRIN 325 MG PO TABS
325.0000 mg | ORAL_TABLET | Freq: Every day | ORAL | Status: DC
  Administered 2017-06-04: 325 mg via ORAL
  Filled 2017-06-04: qty 1

## 2017-06-04 MED ORDER — LORATADINE 10 MG PO TABS
10.0000 mg | ORAL_TABLET | Freq: Every day | ORAL | Status: DC
  Administered 2017-06-04 – 2017-06-07 (×4): 10 mg via ORAL
  Filled 2017-06-04 (×4): qty 1

## 2017-06-04 MED ORDER — INSULIN ASPART 100 UNIT/ML ~~LOC~~ SOLN: 0.0000 [IU] | Freq: Every day | SUBCUTANEOUS | Status: DC

## 2017-06-04 MED ORDER — LORAZEPAM 2 MG/ML IJ SOLN
0.5000 mg | Freq: Once | INTRAMUSCULAR | Status: AC
Start: 2017-06-04 — End: 2017-06-04
  Administered 2017-06-04: 0.5 mg via INTRAVENOUS
  Filled 2017-06-04: qty 1

## 2017-06-04 MED ORDER — LEVETIRACETAM 500 MG PO TABS
500.0000 mg | ORAL_TABLET | Freq: Two times a day (BID) | ORAL | Status: DC
  Administered 2017-06-04 – 2017-06-07 (×7): 500 mg via ORAL
  Filled 2017-06-04 (×7): qty 1

## 2017-06-04 MED ORDER — PANTOPRAZOLE SODIUM 40 MG PO TBEC
40.0000 mg | DELAYED_RELEASE_TABLET | Freq: Every day | ORAL | Status: DC
  Administered 2017-06-04 – 2017-06-07 (×4): 40 mg via ORAL
  Filled 2017-06-04 (×4): qty 1

## 2017-06-04 MED ORDER — APIXABAN 5 MG PO TABS
5.0000 mg | ORAL_TABLET | Freq: Two times a day (BID) | ORAL | Status: DC
  Administered 2017-06-04 – 2017-06-07 (×6): 5 mg via ORAL
  Filled 2017-06-04 (×6): qty 1

## 2017-06-04 MED ORDER — IOPAMIDOL (ISOVUE-370) INJECTION 76%
INTRAVENOUS | Status: AC
  Administered 2017-06-04: 50 mL
  Filled 2017-06-04: qty 100

## 2017-06-04 MED ORDER — INSULIN ASPART 100 UNIT/ML ~~LOC~~ SOLN
0.0000 [IU] | Freq: Three times a day (TID) | SUBCUTANEOUS | Status: DC
  Administered 2017-06-04: 2 [IU] via SUBCUTANEOUS
  Administered 2017-06-05 – 2017-06-06 (×3): 1 [IU] via SUBCUTANEOUS

## 2017-06-04 NOTE — ED Notes (Signed)
ED Provider at bedside. 

## 2017-06-04 NOTE — ED Notes (Signed)
Suzane from [employer] states no recorded events picked up on loop-recorder. Awaiting official report.

## 2017-06-04 NOTE — Consult Note (Signed)
Neurology Consultation Reason for Consult: Stroke Referring Physician: Venora Maples, K  CC: Trouble getting up from toilet  History is obtained from: Family  HPI: Carl Stephenson is a 72 y.o. male with a history of a total of 4 episodes of stroke all with multiple strokes on MRI.  His first stroke happened in October 2012 and involved the posterior corpus callosum on the left and felt to be a white matter lacune.  He then had a cardiac catheterization in 2015 with discoordination following the procedure.  He had embolic appearing strokes on MRI following this.  In July 2018 he had a pontine infarct as well as a  Juxtacortical white matter infarct on the left.  Today he presents with sudden onset difficulty walking.  He states that he felt relatively normal before he sat down on the commode this afternoon.  Following sitting on the commode he tried to stand up but was unable to.   According to his loop interrogation he has had a total of 5 episodes of atrial fibrillation since October 2018, all lasting just a couple of minutes.  These have all been evaluated by cardiology, however, and have been it felt to be sinus rhythm not atrial fibrillation.   LKW: 7:30 PM tpa given?: no, mild symptoms Premorbid modified rankin scale: 3  ROS: A 14 point ROS was performed and is negative except as noted in the HPI.   Past Medical History:  Diagnosis Date  . Acute encephalopathy admitted 12/28/2013  . Bladder tumor    Recurrent  . Bradycardia 01/16/2016  . Cancer [facility])    "had TURBT; cancer free since ~ 11/2013/MD" (12/27/2013)  . Carotid artery disease ([place])    [place] 12/15: Bilateral ICA 1-39 // [place] 1/18: Bilateral ICA 1-39  . Chronic lower back pain   . Coronary artery disease    a. Cath 12/27/2013 EF 35-40%, 80-90% prox LAD treated with balloon angioplasty and 3.0x28 mm Xience DES, LCx with anomalous origin, 50-60% stenosis in OM, 50% stenosis in PDA and PLA // b. [place] 1/18: mLAD stent ok, dLAD 80,  RI stent ok, oLCx stent ok, OM1 85, oRCA stent ok, mRA 40, dRCA 40, RPDA 60/60 >> med Rx  . Diabetes mellitus type II   . GERD (gastroesophageal reflux disease)   . History of echocardiogram    Echo 12/15: Mild LVH, EF 45-50, inf HK, Gr 1 diastolic dysfunction  // Echo 1/18: Moderate LVH, EF 45-50, diffuse HK, mild LAE, RA upper limits of normal // TEE 1/18:  EF 45, diffuse HK, frequent PVCs, trivial MR, mild LAE, normal RVSF, no LA or RA clot, + PFO   . History of hiatal hernia   . Hyperlipidemia   . Hypertension   . 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 Kidney stones    "passed them all" (12/27/2013)  . Obesity   . Seizure disorder as sequela of cerebrovascular accident ([place])   . Stroke [facility]) ~ 2012; 2018   multiple strokes // embolic CVA 03/5007 >> admx with encephalopathy >> s/p ILR  . TIA (transient ischemic attack) "several"  . Tobacco user    Quit 1994  . Wide-complex tachycardia (HCC)      Family History  Problem Relation Age of Onset  . Coronary artery disease Other   . Heart failure Father   . Heart disease Father      Social History:  reports that he quit smoking about 24 years ago. His  smoking use included cigarettes. He has a 66.00 pack-year smoking history. He has never used smokeless tobacco. He reports that he does not drink alcohol or use drugs.   Exam: Current vital signs: BP (!) 152/66   Pulse (!) 112   Resp 15   SpO2 100%  Vital signs in last 24 hours: Pulse Rate:  [54-112] 112 (05/24 0600) Resp:  [14-18] 15 (05/24 0600) BP: (148-175)/(59-86) 152/66 (05/24 0600) SpO2:  [97 %-100 %] 100 % (05/24 0600)   Physical Exam  Constitutional: Appears well-developed and well-nourished.  Psych: Affect appropriate to situation Eyes: No scleral injection HENT: No OP obstrucion Head: Normocephalic.  Cardiovascular: Normal rate and regular rhythm.  Respiratory: Effort normal, non-labored breathing GI: Soft.  No  distension. There is no tenderness.  Skin: WDI  Neuro: Mental Status: Patient is awake, alert, he is able to provide some details of the history, but he is unable to give the month and gives the year is 1800. Cranial Nerves: II: He has inconsistent answers with field counting, more difficulty with the left than the right.  Right pupil is slightly larger than the left III,IV, VI: Initially, on awakening he has a left exotropia, but he conjugates with waking with full EOM V: Facial sensation is symmetric to temperature VII: Facial movement is decreased on the left VIII: hearing is intact to voice X: Uvula elevates symmetrically XI: Shoulder shrug is symmetric. XII: tongue is midline without atrophy or fasciculations.  Motor: He has 4+ strength in the right arm, 4- in the left arm, 4-/5 strength in bilateral hip flexion. Sensory: Sensation is diminished on the right compared to the left to light touch, symmetric to light touch and temperature in the arms and face Cerebellar: He does not cooperate well with finger-nose-finger, but does touch my finger and then reaches for his nose with both hands, slow, but no clear ataxia  I have reviewed labs in epic and the results pertinent to this consultation are: CMP- unremarkable  I have reviewed the images obtained: MRI brain- multiple subcortical infarcts  Impression: 72 year old male with a history of multiple episodes of multiple strokes.  Concern for embolic disease versus some underlying arteriopathy.  His first stroke was at age 66, so this would be in the range for CADASIL, though he lacks the typical anterior temporal  White matter findings associated with this.  He denies headaches typically which I think makes angiitis less likely. Concomitant SVD is possible as well.    No clear evidence of atrial fibrillation on loop recorder.  Recommendations: 1. HgbA1c, fasting lipid panel 2. CTA head and neck 3. Frequent neuro checks  4.  Echocardiogram 5. RPR, HIV 6. Prophylactic therapy-Antiplatelet med: Aspirin - dose 325mg  PO or 300mg  PR and plavix 7. Risk factor modification 8. Telemetry monitoring 9. PT consult, OT consult, Speech consult 10. please page stroke NP  Or  PA  Or MD  from 8am -4 pm as this patient will be followed by the stroke team at this point.   You can look them up on [email or web address]      Roland Rack, MD [facility] [phone]  If 7pm- 7am, please page neurology on call as listed in [place].

## 2017-06-04 NOTE — ED Provider Notes (Signed)
[place] EMERGENCY DEPARTMENT Provider Note   CSN: [id number] Arrival date & time: 06/03/17  2247     History   Chief Complaint Chief Complaint  Patient presents with  . Weakness    HPI Carl Stephenson is a 72 y.o. male.  HPI 72 year old male who has a history of prior strokes and left-sided weakness on Plavix presents to the emergency department after generalized weakness and inability to get off the commode.  He was last seen walking normally around 730 and then went to use the commode.  He was able to ambulate himself to the commode but then he called his wife and stated he was too weak to get off the commode.  He was not having pain at that time.  This persisted for some period of time and then finally family was contacted.  Finally EMS was contacted and he is brought to the emergency department for further evaluation.  Family feels as though he is leaning towards the right.  He has had some more confusion than baseline.  He does have a history of seizures.  No seizure activity noted.  On examination it seems as though he is more weak in his right lower extremity.  No back pain.  No chest pain shortness breath.  No abdominal pain.  Denies headache at this time.  No recent injury or trauma.  Otherwise had a normal week.   Past Medical History:  Diagnosis Date  . Acute encephalopathy admitted 12/28/2013  . Bladder tumor    Recurrent  . Bradycardia 01/16/2016  . Cancer [facility])    "had TURBT; cancer free since ~ 11/2013/MD" (12/27/2013)  . Carotid artery disease ([place])    [place] 12/15: Bilateral ICA 1-39 // [place] 1/18: Bilateral ICA 1-39  . Chronic lower back pain   . Coronary artery disease    a. Cath 12/27/2013 EF 35-40%, 80-90% prox LAD treated with balloon angioplasty and 3.0x28 mm Xience DES, LCx with anomalous origin, 50-60% stenosis in OM, 50% stenosis in PDA and PLA // b. [place] 1/18: mLAD stent ok, dLAD 80, RI stent ok, oLCx stent ok, OM1 85, oRCA stent ok, mRA 40,  dRCA 40, RPDA 60/60 >> med Rx  . Diabetes mellitus type II   . GERD (gastroesophageal reflux disease)   . History of echocardiogram    Echo 12/15: Mild LVH, EF 45-50, inf HK, Gr 1 diastolic dysfunction  // Echo 1/18: Moderate LVH, EF 45-50, diffuse HK, mild LAE, RA upper limits of normal // TEE 1/18:  EF 45, diffuse HK, frequent PVCs, trivial MR, mild LAE, normal RVSF, no LA or RA clot, + PFO   . History of hiatal hernia   . Hyperlipidemia   . Hypertension   . Inferior myocardial infarction ([place]) 1994   Inferior, PCI and Streptokinase  . Kidney stones    "passed them all" (12/27/2013)  . Obesity   . Seizure disorder as sequela of cerebrovascular accident ([place])   . Stroke [facility]) ~ 2012; 2018   multiple strokes // embolic CVA 01/7508 >> admx with encephalopathy >> s/p ILR  . TIA (transient ischemic attack) "several"  . Tobacco user    Quit 1994  . Wide-complex tachycardia [facility])     Patient Active Problem List   Diagnosis Date Noted  . Weakness 09/07/2016  . Gait abnormality 09/07/2016  . Chronic combined systolic and diastolic CHF (congestive heart failure) ([place]) 02/18/2016  . History of embolic stroke 25/85/2778  . Bradycardia 01/16/2016  .  Abnormal EKG   . LBBB (left bundle branch block)   . Coronary artery disease involving native coronary artery of native heart without angina pectoris   . ECG abnormality   . History of CVA (cerebrovascular accident)   . Lactic acidosis   . Convulsions ([place])   . Hypotension 01/16/2014  . CVA (cerebral vascular accident) ([place]) 12/29/2013  . Acute encephalopathy 12/28/2013  . Seborrheic dermatitis 10/04/2012  . Microcytosis 11/07/2011  . Aphasia 06/26/2011  . 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 Cerebral thrombosis with cerebral infarction ([place]) 08/14/2010  . Temporary cerebral vascular dysfunction 10/24/2009  . BENIGN NEOPLASM OF BLADDER 03/16/2008  . Hyperlipidemia 03/16/2008  . Obesity 03/16/2008  . Essential  hypertension 03/16/2008  . MYOCARDIAL INFARCTION 03/16/2008  . UNSPECIFIED PAROXYSMAL TACHYCARDIA 03/16/2008  . GERD 03/16/2008  . Diabetes mellitus ([place]) 03/16/2008    Past Surgical History:  Procedure Laterality Date  . CARDIAC CATHETERIZATION     "he's had a couple before today where they just looked around" (12/27/2013)  . CARDIAC CATHETERIZATION N/A 01/15/2016   Procedure: Left Heart Cath and Coronary Angiography;  Surgeon: Peter M Martinique, MD;  Location: [place] CV LAB;  Service: Cardiovascular;  Laterality: N/A;  . CORONARY ANGIOPLASTY WITH STENT PLACEMENT     "he's had 3-4 stents put in before today"   . CORONARY ANGIOPLASTY WITH STENT PLACEMENT  12/27/2013   "1"  . EP IMPLANTABLE DEVICE N/A 01/20/2016   Procedure: Loop Recorder Insertion;  Surgeon: Evans Lance, MD;  Location: [place] CV LAB;  Service: Cardiovascular;  Laterality: N/A;  . LEFT HEART CATHETERIZATION WITH CORONARY ANGIOGRAM N/A 10/02/2011   Procedure: LEFT HEART CATHETERIZATION WITH CORONARY ANGIOGRAM;  Surgeon: [name] Bow, MD;  Location: [facility] CATH LAB;  Service: Cardiovascular;  Laterality: N/A;  . LEFT HEART CATHETERIZATION WITH CORONARY ANGIOGRAM N/A 12/27/2013   Procedure: LEFT HEART CATHETERIZATION WITH CORONARY ANGIOGRAM;  Surgeon: Blane Ohara, MD;  Location: [facility] CATH LAB;  Service: Cardiovascular;  Laterality: N/A;  . TEE WITHOUT CARDIOVERSION N/A 01/20/2016   Procedure: TRANSESOPHAGEAL ECHOCARDIOGRAM (TEE);  Surgeon: Larey Dresser, MD;  Location: [place];  Service: Cardiovascular;  Laterality: N/A;  . TRANSURETHRAL RESECTION OF BLADDER TUMOR WITH GYRUS (TURBT-GYRUS)  "several times"        Home Medications    Prior to Admission medications   Medication Sig Start Date End Date Taking? Authorizing Provider  acetaminophen (TYLENOL) 325 MG tablet Take 2 tablets (650 mg total) by mouth every 4 (four) hours as needed for headache or mild pain. 01/21/16  Yes Robbie Lis, MD  aspirin EC 81  MG EC tablet Take 1 tablet (81 mg total) by mouth daily. 01/22/16  Yes Robbie Lis, MD  clopidogrel (PLAVIX) 75 MG tablet Take 1 tablet (75 mg total) by mouth daily with breakfast. 06/11/16  Yes Sherren Mocha, MD  fexofenadine (ALLEGRA) 180 MG tablet Take 180 mg by mouth daily.   Yes [provider]  furosemide (LASIX) 40 MG tablet TAKE 1/2 TABLET BY MOUTH DAILY 03/31/17  Yes Sherren Mocha, MD  insulin detemir (LEVEMIR) 100 UNIT/ML injection Inject 70 Units into the skin daily with supper.   Yes [provider]  levETIRAcetam (KEPPRA) 500 MG tablet Take 1 tablet (500 mg total) by mouth 2 (two) times daily. 01/21/16  Yes Robbie Lis, MD  nitroGLYCERIN (NITROSTAT) 0.4 MG SL tablet PLACE 1 TABLET UNDER THE TONGUE EVERY 5 MINUTES AS NEEDED FOR CHEST PAIN 02/19/16  Yes Richardson Dopp T,  PA-C  pantoprazole (PROTONIX) 40 MG tablet Take 40 mg by mouth daily.     Yes [provider]  potassium chloride SA (K-DUR,KLOR-CON) 20 MEQ tablet Take 1 tablet (20 mEq total) by mouth daily. 06/11/16  Yes Sherren Mocha, MD  sertraline (ZOLOFT) 100 MG tablet Take 1.5 tablets (150 mg total) by mouth daily. Patient taking differently: Take 100 mg by mouth daily.  10/21/16  Yes Garvin Fila, MD    Family History Family History  Problem Relation Age of Onset  . Coronary artery disease Other   . Heart failure Father   . Heart disease Father     Social History Social History   Tobacco Use  . Smoking status: Former Smoker    Packs/day: 3.00    Years: 22.00    Pack years: 66.00    Types: Cigarettes    Last attempt to quit: 08/12/1992    Years since quitting: 24.8  . Smokeless tobacco: Never Used  Substance Use Topics  . Alcohol use: No  . Drug use: No     Allergies   Statins; Atorvastatin; and Nitrofurantoin monohyd macro   Review of Systems Review of Systems  All other systems reviewed and are negative.    Physical Exam Updated Vital Signs BP (!) 163/76   Pulse  (!) 58   Resp 17   SpO2 97%   Physical Exam  Constitutional: He is oriented to person, place, and time. He appears well-developed and well-nourished.  HENT:  Head: Normocephalic and atraumatic.  Eyes: EOM are normal.  Neck: Normal range of motion.  Cardiovascular: Normal rate, regular rhythm, normal heart sounds and intact distal pulses.  Pulmonary/Chest: Effort normal and breath sounds normal. No respiratory distress.  Abdominal: Soft. He exhibits no distension. There is no tenderness.  Musculoskeletal: Normal range of motion.  Neurological: He is alert and oriented to person, place, and time.  Inability to fully move his left upper extremity against gravity (baseline).  Unable to lift left lower extremity off bed (baseline).  Strong grip strength of the right hand.  Able to lift right upper extremity against gravity.  No drift. (Baseline).  Unable to lift right lower extremity off bed. (new deficit)  Skin: Skin is warm and dry.  Psychiatric: He has a normal mood and affect. Judgment normal.  Nursing note and vitals reviewed.    ED Treatments / Results  Labs (all labs ordered are listed, but only abnormal results are displayed) Labs Reviewed  COMPREHENSIVE METABOLIC PANEL - Abnormal; Notable for the following components:      Result Value   Glucose, Bld 133 (*)    BUN 25 (*)    Calcium 8.7 (*)    Total Protein 5.8 (*)    Albumin 3.3 (*)    ALT 13 (*)    GFR calc non Af Amer 57 (*)    All other components within normal limits  URINALYSIS, ROUTINE W REFLEX MICROSCOPIC - Abnormal; Notable for the following components:   APPearance HAZY (*)    Protein, ur >=300 (*)    All other components within normal limits  CULTURE, BLOOD (ROUTINE X 2)  CULTURE, BLOOD (ROUTINE X 2)  URINE CULTURE  CBC  I-STAT TROPONIN, ED    EKG EKG Interpretation  Date/Time:  Thursday Jun 03 2017 22:55:19 EDT Ventricular Rate:  76 PR Interval:    QRS Duration: 109 QT Interval:  429 QTC  Calculation: 483 R Axis:   69 Text Interpretation:  Sinus rhythm Paired  ventricular premature complexes Anterior infarct, old No significant change was found Confirmed by Jola Schmidt [phone]) on 06/04/2017 5:00:04 AM   Radiology Dg Chest 2 View  Result Date: 06/04/2017 CLINICAL DATA:  Weakness. EXAM: CHEST - 2 VIEW COMPARISON:  08/04/2016 FINDINGS: Lower lung volumes from prior exam. Cardiomegaly is stable. Mediastinal contours are unchanged. Implanted loop recorder projects over the left chest wall. There is pulmonary edema, mild. No focal airspace disease. No pleural effusion or pneumothorax. No acute osseous abnormality. IMPRESSION: Mild pulmonary edema.  Cardiomegaly is stable. Electronically Signed   By: Jeb Levering M.D.   On: 06/04/2017 01:19   Ct Head Wo Contrast  Result Date: 06/04/2017 CLINICAL DATA:  Weakness EXAM: CT HEAD WITHOUT CONTRAST TECHNIQUE: Contiguous axial images were obtained from the base of the skull through the vertex without intravenous contrast. COMPARISON:  MRI 08/05/2016, CT brain 08/04/2016 FINDINGS: Brain: No hemorrhage or intracranial mass is visualized. Extensive small vessel ischemic changes of the white matter. Moderate atrophy. Multiple old lacunar infarcts in the thalamus and basal ganglia. Age indeterminate lacunar infarct in left basal ganglia. Stable ventricle size. Old lacunar infarct in the pons. Vascular: No hyperdense vessel. Vertebral artery and carotid vascular calcification. Skull: No fracture or suspicious lesion Sinuses/Orbits: Mild mucosal thickening in the ethmoid sinuses. No acute orbital abnormality. Other: None IMPRESSION: 1. Atrophy with extensive small vessel ischemic changes of the white matter and multiple old lacunar infarcts in the pons, thalamus, and basal ganglia. Age indeterminate lacunar infarct in the left basal ganglia since the comparison CT. 2. Otherwise no CT evidence for acute intracranial abnormality. Electronically Signed   By:  Donavan Foil M.D.   On: 06/04/2017 01:23   Mr Brain Wo Contrast  Result Date: 06/04/2017 CLINICAL DATA:  Weakness, bradycardia. History of seizures, carotid artery disease, diabetes, hypertension, hyperlipidemia and cancer. EXAM: MRI HEAD WITHOUT CONTRAST TECHNIQUE: Multiplanar, multiecho pulse sequences of the brain and surrounding structures were obtained without intravenous contrast. COMPARISON:  CT HEAD Jun 04, 2017 and MRI of the head August 05, 2016 FINDINGS: INTRACRANIAL CONTENTS: Subcentimeter reduced diffusion RIGHT posterior frontal lobe and genu LEFT capsule with low ADC values. Punctate RIGHT frontal reduced diffusion, difficult to confirm ADC values due to small size.Chronic microhemorrhage RIGHT pons. 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 Confluent supratentorial pontine white matter T2 hyperintensities. Prominent basal ganglia perivascular spaces associated with chronic small vessel ischemic changes. No midline shift, mass effect or masses. Moderate parenchymal brain volume loss. No hydrocephalus. No abnormal extra-axial fluid collections. VASCULAR: Normal major intracranial vascular flow voids present at skull base. SKULL AND UPPER CERVICAL SPINE: No abnormal sellar expansion. No suspicious calvarial bone marrow signal. Craniocervical junction maintained. SINUSES/ORBITS: The mastoid air-cells and included paranasal sinuses are well-aerated.The included ocular globes and orbital contents are non-suspicious. Status post LEFT ocular lens implant. OTHER: None. IMPRESSION: 1. 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 Punctate RIGHT frontal acute versus subacute infarct. 2. 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 3. Moderate to severe chronic small vessel ischemic changes. Electronically Signed   By: Elon Alas M.D.   On: 06/04/2017 04:37    Procedures Procedures (including critical care time)  Medications Ordered in  ED Medications  LORazepam (ATIVAN) injection 0.5 mg (0.5 mg Intravenous Given 06/04/17 0323)     Initial Impression / Assessment and Plan / ED Course  I have reviewed the triage vital signs and the nursing notes.  Pertinent labs & imaging results that were available during my care  of the patient were reviewed by me and considered in my medical decision making (see chart for details).     Concerning for new right lower extremity weakness.  Complex history however.  Patient is outside the window for TPA given last seen well at 7:30 PM.  There does not appear to be large vessel occlusion and therefore I do not think he needs stat neurology consultation as it is unlikely that he will benefit from catheter directed lytics.  This is concerning however for a new stroke.  To be worked up from a medical standpoint and will initiate CT imaging of the head.  Will likely need to proceed to MRI.  If we do proceed to MRI he will need interrogation of his loop recorder.  Loop recorder was interrogated and shows 5 episodes of atrial fibrillation since September 2018.  He may be a candidate for anticoagulation given his history of stroke.  Will proceed to MRI  MRI demonstrates acute stroke likely embolic in nature especially given his history of paroxysmal atrial fibrillation.   Patient will be admitted to hospital for additional work-up.  Consultations: Neurology: Dr. Leonel Ramsay Hospitalist: [facility]   Final Clinical Impressions(s) / ED Diagnoses   Final diagnoses:  Ischemic stroke [facility])    ED Discharge Orders    None       Jola Schmidt, MD 06/04/17 [phone]

## 2017-06-04 NOTE — ED Notes (Signed)
S/p ativan administration pt is drowsy and unable to follow instructions for swallow screen at this time. He has no hx of dysphagia diet. Will re-attempt when pt is more awake.

## 2017-06-04 NOTE — Progress Notes (Signed)
  Modified Barium Swallow Progress Note  Patient Details  Name: Carl Stephenson MRN: [id number] Date of Birth: [birth year]  Today's Date: 06/04/2017  Modified Barium Swallow completed.  Full report located under Chart Review in the Imaging Section.  Brief recommendations include the following:  Clinical Impression  MBS was completed using thin liquids via tsp and cup, nectar thick liquids via cup, honey thick liquids via cup, pureed material and regular solids.  The patient presented with a mild oropharyngeal dysphagia.  The oral phase was characterized by decreased lingual movement that led to delayed oral transit across boluses.  The pharngeal phase was characterized by a swallow trigger in either the vallecula or pyriform sinuses.  Thin liquids triggered in the pyriform sinuses and thicker material in the vallecula.  The patient was noted to have reduced laryngeal vestibule closure, decreased base of tongue retraction and mildly decreased pharyngeal constriction.  These deficits led to vallecula, pyriform sinus and posterior pharyngeal wall residue.  This did not appear to be sensed by the patient.  In addition, the patient was observed to penetrate into the laryngeal vestibule with thin liquids via cup and nectar thick liquids via cup.  Intermittent penetration into the laryngeal vestibule was seen given thin liquids via spoon.  Use of a chin tuck was attempted and unsuccessful.  In fact it made the penetration worse.  Cued cough with reswallow was successful to clear material from the laryngeal vestibule but the patient struggled to generate a volitional swallow.  Esophageal sweep revealed it slow to clear.  Recommend a mechanical soft diet with thin liquids.  Patient should clear throat and reswallow given liquids to mitigate risk of aspiration.  In addition, oral care should be provided PRIOR to meals to mitigate risk of aspiration.  If patient has a decline in respiratory status may need to  downgrade the patient's liquids to honey thick.  ST will follow during acute stay.  He will benefit from ST follow up at the next level of care.     Swallow Evaluation Recommendations       SLP Diet Recommendations: Thin liquid;Dysphagia 3 (Mech soft) solids   Liquid Administration via: Cup;No straw   Medication Administration: Whole meds with puree   Supervision: Staff to assist with self feeding   Compensations: Slow rate;Small sips/bites;Follow solids with liquid(cough reswallow with liquids)   Postural Changes: Seated upright at 90 degrees   Oral Care Recommendations: Oral care before and after [place], MA, CCC-SLP Acute Rehab SLP [phone]  Lamar Sprinkles 06/04/2017,3:24 PM

## 2017-06-04 NOTE — Progress Notes (Signed)
STROKE TEAM PROGRESS NOTE   INTERVAL HISTORY His daughter, son and wife are at the bedside.  Pt lying in bed comfortably. Disorientated but not in distress. MRI showed new left BG and right MCA/ACA x2 small punctate infarcts. I had long discussion with all family members in room regarding further medication treatment options. Discussed with clinical trials and empiric treatment with anticoagulation. They are in agreement that they would like to try anticoagulation for further stroke prevention given multiple failures of the antiplatelet therapy.   Vitals:   06/04/17 0830 06/04/17 0845 06/04/17 0900 06/04/17 0930  BP: (!) 162/84 (!) 159/83 (!) 157/83 (!) 152/76  Pulse:   69 77  Resp: 15 16 17 16   SpO2:  97% 94% 92%    CBC:  Recent Labs  Lab 06/03/17 2310  WBC 9.2  HGB 13.2  HCT 40.6  MCV 80.9  PLT 440    Basic Metabolic Panel:  Recent Labs  Lab 06/03/17 2310  NA 141  K 4.0  CL 107  CO2 25  GLUCOSE 133*  BUN 25*  CREATININE 1.22  CALCIUM 8.7*   Lipid Panel:     Component Value Date/Time   CHOL 231 (H) 02/09/2017 1420   TRIG 388 (H) 02/09/2017 1420   HDL 30 (L) 02/09/2017 1420   CHOLHDL 7.7 (H) 02/09/2017 1420   CHOLHDL 8.0 01/17/2016 0416   VLDL 48 (H) 01/17/2016 0416   LDLCALC 123 (H) 02/09/2017 1420   HgbA1c:  Lab Results  Component Value Date   HGBA1C 7.7 (H) 01/16/2016   Urine Drug Screen:     Component Value Date/Time   LABOPIA NONE DETECTED 01/15/2016 1021   COCAINSCRNUR NONE DETECTED 01/15/2016 1021   LABBENZ NONE DETECTED 01/15/2016 1021   AMPHETMU NONE DETECTED 01/15/2016 1021   THCU NONE DETECTED 01/15/2016 1021   LABBARB NONE DETECTED 01/15/2016 1021    Alcohol Level     Component Value Date/Time   ETH <5 01/15/2016 1256    IMAGING I have personally reviewed the radiological images below and agree with the radiology interpretations.  Ct Angio Head W Or Wo Contrast  Result Date: 06/04/2017 CLINICAL DATA:  Bilateral subcortical and  basal ganglia acute/subacute lacunar infarcts. EXAM: CT ANGIOGRAPHY HEAD AND NECK TECHNIQUE: Multidetector CT imaging of the head and neck was performed using the standard protocol during bolus administration of intravenous contrast. Multiplanar CT image reconstructions and MIPs were obtained to evaluate the vascular anatomy. Carotid stenosis measurements (when applicable) are obtained utilizing NASCET criteria, using the distal internal carotid diameter as the denominator. CONTRAST:  <See Chart> ISOVUE-370 IOPAMIDOL (ISOVUE-370) INJECTION 76% COMPARISON:  MRI brain 06/04/2017. CT head without contrast 06/04/2017 FINDINGS: CTA NECK FINDINGS Aortic arch: A 3 vessel arch configuration is present. No significant atherosclerotic calcifications are present. There is no aneurysm or stenosis at the origins the great vessels. Right carotid system: The right common carotid artery is within normal limits. Atherosclerotic changes are present at the proximal right internal carotid artery and bifurcation both calcified and noncalcified plaque. Minimal luminal diameter is 3.3 mm. More distal cervical right ICA is within normal limits. Left carotid system: The left common carotid artery is within normal limits. Atherosclerotic calcifications are present at the left carotid bifurcation proximal left ICA. Calcified and noncalcified plaque is present. Minimal luminal diameter is 3.7 mm. More distal cervical left ICA is mildly tortuous. There is calcification just below the skull base without a significant stenosis. Vertebral arteries: Left vertebral artery is the dominant vessel. Both vertebral  arteries originate from the subclavian arteries. There is some atherosclerotic irregularity proximally within the right vertebral artery without significant stenosis. There is no significant stenosis of either vertebral artery in the neck. Skeleton: Multilevel degenerative changes are present in the cervical spine. Vertebral disease is present  at C5-6 and C6-7. Foraminal narrowing is worse on the left at C5-6 and on the right at C6-7. Right-sided uncovertebral and facet spurring contributes to foraminal narrowing at C3-4. Vertebral body heights and alignment are maintained. No focal lytic or blastic lesions are present. Other neck: New soft tissues the neck are otherwise unremarkable. Salivary glands are within normal limits. No significant adenopathy is present. Thyroid is within normal limits. Upper chest: Centrilobular and paraseptal emphysematous changes are present. Ground-glass attenuation likely reflects mild edema or atelectasis. Review of the MIP images confirms the above findings CTA HEAD FINDINGS Anterior circulation: Atherosclerotic calcifications are present within the cavernous internal carotid arteries bilaterally without a significant luminal stenosis relative to the more distal ICA termini. A high-grade stenosis or partial occlusion is present in the proximal right M1 segment. This extends over approximately 5 mm. Right A1, left A1, and left M1 segments are normal. Left MCA bifurcation and proximal branch vessels are within normal limits. There is mild irregularity of distal MCA segments on the left and bilateral ACA segments. Right MCA is intact. Vessels are attenuated compared to the left without a significant focal branch vessel occlusion. There is no aneurysm. Posterior circulation: The left vertebral artery is the dominant vessel. Atherosclerotic calcifications are present at the dural margin bilaterally without significant stenosis at that level. PICA origins are within normal limits. A high-grade stenosis or occlusion of the distal right vertebral artery is present proximal to the vertebrobasilar junction. The left vertebral artery feeds the basilar artery. Basilar artery is within normal limits. High-grade proximal PCA stenoses are noted bilaterally without significant posterior communicating arteries. There is significant  attenuation of PCA branch vessels on the left. Irregularity is present throughout right PCA branch vessels without a significant branch vessel occlusion. The tandem high-grade left P2 segment stenosis is present. Venous sinuses: Dural sinuses are patent. Straight sinus and deep cerebral veins are intact. Cortical veins are unremarkable. Anatomic variants: None Delayed phase: Postcontrast images demonstrate no pathologic enhancement. Review of the MIP images confirms the above findings IMPRESSION: 1. High-grade stenosis proximal right M1 segment with asymmetric attenuation of right MCA branch vessels but no significant proximal branch vessel occlusion. This suggests collateral support to the right MCA branch vessels. 2. High-grade proximal PCA stenoses bilaterally with tandem left P2 segment high-grade stenosis and asymmetric attenuation of left PCA branch vessels. 3. Atherosclerotic changes at the carotid bifurcations and cavernous internal carotid arteries bilaterally without other focal stenosis. 4. No other significant proximal branch vessel occlusion. 5. No intracranial aneurysm. 6. Multilevel spondylosis of the cervical spine as described. 7. Emphysema (ICD10-J43.9). Centrilobular and paraseptal emphysematous changes are noted at the lung apices. 8. Ground-glass attenuation at the lung apices likely reflects atelectasis or edema. Electronically Signed   By: San Morelle M.D.   On: 06/04/2017 08:24   Dg Chest 2 View  Result Date: 06/04/2017 CLINICAL DATA:  Weakness. EXAM: CHEST - 2 VIEW COMPARISON:  08/04/2016 FINDINGS: Lower lung volumes from prior exam. Cardiomegaly is stable. Mediastinal contours are unchanged. Implanted loop recorder projects over the left chest wall. There is pulmonary edema, mild. No focal airspace disease. No pleural effusion or pneumothorax. No acute osseous abnormality. IMPRESSION: Mild pulmonary edema.  Cardiomegaly is  stable. Electronically Signed   By: Jeb Levering  M.D.   On: 06/04/2017 01:19   Ct Head Wo Contrast  Result Date: 06/04/2017 CLINICAL DATA:  Weakness EXAM: CT HEAD WITHOUT CONTRAST TECHNIQUE: Contiguous axial images were obtained from the base of the skull through the vertex without intravenous contrast. COMPARISON:  MRI 08/05/2016, CT brain 08/04/2016 FINDINGS: Brain: No hemorrhage or intracranial mass is visualized. Extensive small vessel ischemic changes of the white matter. Moderate atrophy. Multiple old lacunar infarcts in the thalamus and basal ganglia. Age indeterminate lacunar infarct in left basal ganglia. Stable ventricle size. Old lacunar infarct in the pons. Vascular: No hyperdense vessel. Vertebral artery and carotid vascular calcification. Skull: No fracture or suspicious lesion Sinuses/Orbits: Mild mucosal thickening in the ethmoid sinuses. No acute orbital abnormality. Other: None IMPRESSION: 1. Atrophy with extensive small vessel ischemic changes of the white matter and multiple old lacunar infarcts in the pons, thalamus, and basal ganglia. Age indeterminate lacunar infarct in the left basal ganglia since the comparison CT. 2. Otherwise no CT evidence for acute intracranial abnormality. Electronically Signed   By: Donavan Foil M.D.   On: 06/04/2017 01:23   Ct Angio Neck W Or Wo Contrast  Result Date: 06/04/2017 CLINICAL DATA:  Bilateral subcortical and basal ganglia acute/subacute lacunar infarcts. EXAM: CT ANGIOGRAPHY HEAD AND NECK TECHNIQUE: Multidetector CT imaging of the head and neck was performed using the standard protocol during bolus administration of intravenous contrast. Multiplanar CT image reconstructions and MIPs were obtained to evaluate the vascular anatomy. Carotid stenosis measurements (when applicable) are obtained utilizing NASCET criteria, using the distal internal carotid diameter as the denominator. CONTRAST:  <See Chart> ISOVUE-370 IOPAMIDOL (ISOVUE-370) INJECTION 76% COMPARISON:  MRI brain 06/04/2017. CT head  without contrast 06/04/2017 FINDINGS: CTA NECK FINDINGS Aortic arch: A 3 vessel arch configuration is present. No significant atherosclerotic calcifications are present. There is no aneurysm or stenosis at the origins the great vessels. Right carotid system: The right common carotid artery is within normal limits. Atherosclerotic changes are present at the proximal right internal carotid artery and bifurcation both calcified and noncalcified plaque. Minimal luminal diameter is 3.3 mm. More distal cervical right ICA is within normal limits. Left carotid system: The left common carotid artery is within normal limits. Atherosclerotic calcifications are present at the left carotid bifurcation proximal left ICA. Calcified and noncalcified plaque is present. Minimal luminal diameter is 3.7 mm. More distal cervical left ICA is mildly tortuous. There is calcification just below the skull base without a significant stenosis. Vertebral arteries: Left vertebral artery is the dominant vessel. Both vertebral arteries originate from the subclavian arteries. There is some atherosclerotic irregularity proximally within the right vertebral artery without significant stenosis. There is no significant stenosis of either vertebral artery in the neck. Skeleton: Multilevel degenerative changes are present in the cervical spine. Vertebral disease is present at C5-6 and C6-7. Foraminal narrowing is worse on the left at C5-6 and on the right at C6-7. Right-sided uncovertebral and facet spurring contributes to foraminal narrowing at C3-4. Vertebral body heights and alignment are maintained. No focal lytic or blastic lesions are present. Other neck: New soft tissues the neck are otherwise unremarkable. Salivary glands are within normal limits. No significant adenopathy is present. Thyroid is within normal limits. Upper chest: Centrilobular and paraseptal emphysematous changes are present. Ground-glass attenuation likely reflects mild edema or  atelectasis. Review of the MIP images confirms the above findings CTA HEAD FINDINGS Anterior circulation: Atherosclerotic calcifications are present within the cavernous  internal carotid arteries bilaterally without a significant luminal stenosis relative to the more distal ICA termini. A high-grade stenosis or partial occlusion is present in the proximal right M1 segment. This extends over approximately 5 mm. Right A1, left A1, and left M1 segments are normal. Left MCA bifurcation and proximal branch vessels are within normal limits. There is mild irregularity of distal MCA segments on the left and bilateral ACA segments. Right MCA is intact. Vessels are attenuated compared to the left without a significant focal branch vessel occlusion. There is no aneurysm. Posterior circulation: The left vertebral artery is the dominant vessel. Atherosclerotic calcifications are present at the dural margin bilaterally without significant stenosis at that level. PICA origins are within normal limits. A high-grade stenosis or occlusion of the distal right vertebral artery is present proximal to the vertebrobasilar junction. The left vertebral artery feeds the basilar artery. Basilar artery is within normal limits. High-grade proximal PCA stenoses are noted bilaterally without significant posterior communicating arteries. There is significant attenuation of PCA branch vessels on the left. Irregularity is present throughout right PCA branch vessels without a significant branch vessel occlusion. The tandem high-grade left P2 segment stenosis is present. Venous sinuses: Dural sinuses are patent. Straight sinus and deep cerebral veins are intact. Cortical veins are unremarkable. Anatomic variants: None Delayed phase: Postcontrast images demonstrate no pathologic enhancement. Review of the MIP images confirms the above findings IMPRESSION: 1. High-grade stenosis proximal right M1 segment with asymmetric attenuation of right MCA branch  vessels but no significant proximal branch vessel occlusion. This suggests collateral support to the right MCA branch vessels. 2. High-grade proximal PCA stenoses bilaterally with tandem left P2 segment high-grade stenosis and asymmetric attenuation of left PCA branch vessels. 3. Atherosclerotic changes at the carotid bifurcations and cavernous internal carotid arteries bilaterally without other focal stenosis. 4. No other significant proximal branch vessel occlusion. 5. No intracranial aneurysm. 6. Multilevel spondylosis of the cervical spine as described. 7. Emphysema (ICD10-J43.9). Centrilobular and paraseptal emphysematous changes are noted at the lung apices. 8. Ground-glass attenuation at the lung apices likely reflects atelectasis or edema. Electronically Signed   By: San Morelle M.D.   On: 06/04/2017 08:24   Mr Brain Wo Contrast  Result Date: 06/04/2017 CLINICAL DATA:  Weakness, bradycardia. History of seizures, carotid artery disease, diabetes, hypertension, hyperlipidemia and cancer. EXAM: MRI HEAD WITHOUT CONTRAST TECHNIQUE: Multiplanar, multiecho pulse sequences of the brain and surrounding structures were obtained without intravenous contrast. COMPARISON:  CT HEAD Jun 04, 2017 and MRI of the head August 05, 2016 FINDINGS: INTRACRANIAL CONTENTS: Subcentimeter reduced diffusion RIGHT posterior frontal lobe and genu LEFT capsule with low ADC values. Punctate RIGHT frontal reduced diffusion, difficult to confirm ADC values due to small size.Chronic microhemorrhage RIGHT pons. 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 Confluent supratentorial pontine white matter T2 hyperintensities. Prominent basal ganglia perivascular spaces associated with chronic small vessel ischemic changes. No midline shift, mass effect or masses. Moderate parenchymal brain volume loss. No hydrocephalus. No abnormal extra-axial fluid collections. VASCULAR: Normal major intracranial vascular flow voids  present at skull base. SKULL AND UPPER CERVICAL SPINE: No abnormal sellar expansion. No suspicious calvarial bone marrow signal. Craniocervical junction maintained. SINUSES/ORBITS: The mastoid air-cells and included paranasal sinuses are well-aerated.The included ocular globes and orbital contents are non-suspicious. Status post LEFT ocular lens implant. OTHER: None. IMPRESSION: 1. 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 Punctate RIGHT frontal acute versus subacute infarct. 2. 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 3. Moderate  to severe chronic small vessel ischemic changes. Electronically Signed   By: Elon Alas M.D.   On: 06/04/2017 04:37   TTE pending   PHYSICAL EXAM   Temp:  [97.6 F (36.4 C)] 97.6 F (36.4 C) (05/24 1706) Pulse Rate:  [54-112] 58 (05/24 1706) Resp:  [14-18] 17 (05/24 1706) BP: (148-179)/(59-92) 160/66 (05/24 1706) SpO2:  [92 %-100 %] 95 % (05/24 1706) Weight:  [187 lb 9.8 oz (85.1 kg)] 187 lb 9.8 oz (85.1 kg) (05/24 1700)  General - Well nourished, well developed, in no apparent distress.  Ophthalmologic - Fundi not visualized due to noncooperation.  Cardiovascular - Regular rate and rhythm.  Mental Status -  Level of arousal and orientation to self and people, but not to time and place. Language including expression, naming, repetition, comprehension was assessed and found intact, however, paucity of speech and psychomotor slowing. Fund of Knowledge was assessed and was severely impaired.  Cranial Nerves II - XII - II - Visual field intact OU. III, IV, VI - Extraocular movements intact. V - Facial sensation intact bilaterally. VII - mild left nasolabial fold flattening. VIII - Hearing & vestibular intact bilaterally. X - Palate elevates symmetrically. XI - Chin turning & shoulder shrug intact bilaterally. XII - Tongue protrusion intact.  Motor Strength - The patient's strength was 4/5 BUEs and 3/5 BLEs.  Bulk  was normal and fasciculations were absent.   Motor Tone - Muscle tone was assessed at the neck and appendages and was normal.  Reflexes - The patient's reflexes were 1+ in all extremities and he had no pathological reflexes.  Sensory - Light touch, temperature/pinprick were assessed and were symmetrical.    Coordination - The patient had normal movements in the hands with no ataxia or dysmetria, but slow in action.  Tremor was absent.  Gait and Station - deferred.  NIH Stroke Scale  Level Of Consciousness 0=Alert; keenly responsive 1=Not alert, but arousable by minor stimulation 2=Not alert, requires repeated stimulation 3=Responds only with reflex movements 0  LOC Questions to Month and Age 69=Answers both questions correctly 1=Answers one question correctly 2=Answers neither question correctly 2  LOC Commands      -Open/Close eyes     -Open/close grip 0=Performs both tasks correctly 1=Performs one task correctly 2=Performs neighter task correctly 0  Best Gaze 0=Normal 1=Partial gaze palsy 2=Forced deviation, or total gaze paresis 0  Visual 0=No visual loss 1=Partial hemianopia 2=Complete hemianopia 3=Bilateral hemianopia (blind including cortical blindness) 0  Facial Palsy 0=Normal symmetrical movement 1=Minor paralysis (asymmetry) 2=Partial paralysis (lower face) 3=Complete paralysis (upper and lower face) 1  Motor  0=No drift, limb holds posture for full 10 seconds 1=Drift, limb holds posture, no drift to bed 2=Some antigravity effort, cannot maintain posture, drifts to bed 3=No effort against gravity, limb falls 4=No movement Right Arm 1     Leg 0    Left Arm 1     Leg 1  Limb Ataxia 0=Absent 1=Present in one limb 2=Present in two limbs 0  Sensory 0=Normal 1=Mild to moderate sensory loss 2=Severe to total sensory loss 0  Best Language 0=No aphasia, normal 1=Mild to moderate aphasia 2=Mute, global aphasia 3=Mute, global aphasia 0  Dysarthria 0=Normal 1=Mild  to moderate 2=Severe, unintelligible or mute/anarthric 0  Extinction/Neglect 0=No abnormality 1=Extinction to bilateral simultaneous stimulation 2=Profound neglect 1  Total   7     ASSESSMENT/PLAN [name] is a 72 y.o. male with history of multiple previous cryptogenic strokes since 2012,  HTN, HLD, DM, loop recorder placement neg for AF, obesity, CAD/MI w/ stent, bladder cancer presenting with generalized weakness.   Stroke:   Acute L basal ganglia infarct and acute/subacute R MCA/ACA x 2 infarcts in setting of multiple previous cryptogenic infarcts. All infarcts felt to be embolic secondary to unknown source  CT head Severe small vessel disease. Atrophy. New L BG infarct since last imaging.  MRI  Acute L basal ganglia infarct. Subacute R MCA/ACA infarct  CTA head & neck high grade R M1 and prox B PCA stenosis, tandem L P2 high grade stenosis. atherosclerosis at ICA bifurcations and cavernous.   2D Echo  pending   LDL pending   HgbA1c pending   Ioop recorder - confirmed with EP today all reported AF episodes are NOT AF but SR w/ ectopy and PACs  Lovenox 40 mg sq daily for VTE prophylaxis  aspirin 81 mg daily and clopidogrel 75 mg daily prior to admission, now on aspirin 325 mg daily and clopidogrel 75 mg daily.   Considered Axiomatic trial, however excluded from trial due to high NIHSS.  Had lengthy discussion with family and pt regarding recurrent cryptogenic strokes while on antiplatelets as well as current treatment strategies, pt and family are in consensus that they would like [facility] for further stroke prevention  Will recommend eliquis 5mg  bid this time Also ASA 81mg  on top of eliquis due to atherosclerosis  Therapy recommendations:  pending   Disposition:  pending   Hx stroke/TIA  10/2010 - posterior corpus callosum on the left and felt to be a white matter lacune.  12/2013 - MRI left frontal and right PICA punctate infarcts, old left thalamic and b/l BG  lacunar infarcts. 2 days post cardiac cath and stent placement. CTA h&n bilateral siphon and ICA proximal athero. LDL 68 and A1C 7.3. put on DAPT. TEE and loop not completed.  01/2016 - R brain, anterior and posterior circulation punctate infarcts, felt to be cardioembolic. 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 CUS neg, TT EF 45-50%, TEE small PFO, LDL 140 and A1C 7.7. Loop placed. Pt discharged with DAPT and crestor and zetia.    07/2016 - R pontine, L corona radiata infarcts. CTA h/n R M1 stenosis. Admitted to [facility]  History of seizure  01/2016 during admission - GTC - EEG showed left focal slowing - pt on keppra  Hypertension  Stable 150-160s . Permissive hypertension (OK if < 220/120) but gradually normalize in 5-7 days . Long-term BP goal normotensive  Hyperlipidemia  Home meds:  No statin listed, has been prescribed previously  LDL pending, goal < 70  Statin intolerance  Follows with PCP for consideration of PCSK9 inhibitors  Diabetes type II  HgbA1c pending, goal < 7.0  SSI  CBG monitoring  Other Stroke Risk Factors  Advanced age  Former Cigarette smoker, quit 26 yrs ago  Coronary artery disease s/p stent  Other Active Problems    Hospital day # 0  I spent  35 minutes in total face-to-face time with the patient, more than 50% of which was spent in counseling and coordination of care, reviewing test results, images and medication, and discussing the diagnosis of recurrent embolic strokes, treatment plan and potential prognosis. This patient's care requiresreview of multiple databases, neurological assessment, discussion with family, other specialists and medical decision making of high complexity.   Neurology will sign off. Please call with questions. Pt will follow up with stroke clinic [name] at [facility] in about 4  weeks. Thanks for the consult.  Rosalin Hawking, MD PhD Stroke Neurology 06/04/2017 5:51 PM   To contact Stroke Continuity  provider, please refer to [email or web address] After hours, contact General Neurology

## 2017-06-04 NOTE — Evaluation (Signed)
Clinical/Bedside Swallow Evaluation Patient Details  Name: Carl Stephenson MRN: [id number] Date of Birth: Nov 02, 1945  Today's Date: 06/04/2017 Time: SLP Start Time (ACUTE ONLY): 1045 SLP Stop Time (ACUTE ONLY): 1105 SLP Time Calculation (min) (ACUTE ONLY): 20 min  Past Medical History:  Past Medical History:  Diagnosis Date  . Acute encephalopathy admitted 12/28/2013  . Bladder tumor    Recurrent  . Bradycardia 01/16/2016  . Cancer [facility])    "had TURBT; cancer free since ~ 11/2013/MD" (12/27/2013)  . Carotid artery disease ([place])    [place] 12/15: Bilateral ICA 1-39 // [place] 1/18: Bilateral ICA 1-39  . Chronic lower back pain   . Coronary artery disease    a. Cath 12/27/2013 EF 35-40%, 80-90% prox LAD treated with balloon angioplasty and 3.0x28 mm Xience DES, LCx with anomalous origin, 50-60% stenosis in OM, 50% stenosis in PDA and PLA // b. [place] 1/18: mLAD stent ok, dLAD 80, RI stent ok, oLCx stent ok, OM1 85, oRCA stent ok, mRA 40, dRCA 40, RPDA 60/60 >> med Rx  . Diabetes mellitus type II   . GERD (gastroesophageal reflux disease)   . History of echocardiogram    Echo 12/15: Mild LVH, EF 45-50, inf HK, Gr 1 diastolic dysfunction  // Echo 1/18: Moderate LVH, EF 45-50, diffuse HK, mild LAE, RA upper limits of normal // TEE 1/18:  EF 45, diffuse HK, frequent PVCs, trivial MR, mild LAE, normal RVSF, no LA or RA clot, + PFO   . History of hiatal hernia   . Hyperlipidemia   . Hypertension   . 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 Kidney stones    "passed them all" (12/27/2013)  . Obesity   . Seizure disorder as sequela of cerebrovascular accident ([place])   . Stroke [facility]) ~ 2012; 2018   multiple strokes // embolic CVA 06/7339 >> admx with encephalopathy >> s/p ILR  . TIA (transient ischemic attack) "several"  . Tobacco user    Quit 1994  . Wide-complex tachycardia ([place])    Past Surgical History:  Past Surgical History:  Procedure Laterality Date  .  CARDIAC CATHETERIZATION     "he's had a couple before today where they just looked around" (12/27/2013)  . CARDIAC CATHETERIZATION N/A 01/15/2016   Procedure: Left Heart Cath and Coronary Angiography;  Surgeon: Peter M Martinique, MD;  Location: [place] CV LAB;  Service: Cardiovascular;  Laterality: N/A;  . CORONARY ANGIOPLASTY WITH STENT PLACEMENT     "he's had 3-4 stents put in before today"   . CORONARY ANGIOPLASTY WITH STENT PLACEMENT  12/27/2013   "1"  . EP IMPLANTABLE DEVICE N/A 01/20/2016   Procedure: Loop Recorder Insertion;  Surgeon: Evans Lance, MD;  Location: [place] CV LAB;  Service: Cardiovascular;  Laterality: N/A;  . LEFT HEART CATHETERIZATION WITH CORONARY ANGIOGRAM N/A 10/02/2011   Procedure: LEFT HEART CATHETERIZATION WITH CORONARY ANGIOGRAM;  Surgeon: [name] Bow, MD;  Location: [facility] CATH LAB;  Service: Cardiovascular;  Laterality: N/A;  . LEFT HEART CATHETERIZATION WITH CORONARY ANGIOGRAM N/A 12/27/2013   Procedure: LEFT HEART CATHETERIZATION WITH CORONARY ANGIOGRAM;  Surgeon: Blane Ohara, MD;  Location: [facility] CATH LAB;  Service: Cardiovascular;  Laterality: N/A;  . TEE WITHOUT CARDIOVERSION N/A 01/20/2016   Procedure: TRANSESOPHAGEAL ECHOCARDIOGRAM (TEE);  Surgeon: Larey Dresser, MD;  Location: [place];  Service: Cardiovascular;  Laterality: N/A;  . TRANSURETHRAL RESECTION OF BLADDER TUMOR WITH GYRUS (TURBT-GYRUS)  "several times"   HPI:  Carl Stephenson is a 72 y.o. male with a history of a total of 4 episodes of stroke all with multiple strokes on MRI.  His first stroke happened in October 2012 and involved the posterior corpus callosum on the left and felt to be a white matter lacune.  He then had a cardiac catheterization in 2015 with discoordination following the procedure.  He had embolic appearing strokes on MRI following this.  In July 2018 he had a pontine infarct as well as a Juxtacortical white matter infarct on the left.  Today he presents with  sudden onset difficulty walking.  He states that he felt relatively normal before he sat down on the commode this afternoon.  Following sitting on the commode he tried to stand up but was unable to.  MRI is showing right frontal and left internal capsule infarct as well as numerous old infarcts.  Chest xray is showing mild pulmonary edema.   Assessment / Plan / Recommendation Clinical Impression  Clinical swallowing evaluation was completed using thin liquids via spoon and cup and pureed material.  Patient with previous clinical swallowing evaluation completed on 12/29/2013 with initial recommendation for dysphagia 3 with thin liquids.  He was eventually upgraded to regular and thin.  The family present reported that the patient had speech therapy at [place] when he was there in July 2018.  Patient nor family reported any respiratory issues since then.  However, they did reoprt that he does cough intermittently wtih intake.  Oral mechanism exam was completed and remarkable for left sided labial droop, decreased and weak jaw opening and lingual weakness.  The patient presented with a possible oropharyngeal dysphagia.  The oral phase appeared to be slow and swallow trigger appeared to be delayed.  Immediate strong cough was seen given thin liquids via spoon and cup.  Recommend that the patient remain NPO and MBS be completed to determine current swallowing physiology and least restrictive diet.   SLP Visit Diagnosis: Dysphagia, oropharyngeal phase (R13.12)    Aspiration Risk  Moderate aspiration risk    Diet Recommendation   NPO pending MBS.  Medication Administration: Crushed with puree    Other  Recommendations Oral Care Recommendations: Oral care BID   Follow up Recommendations Other (comment)(TBD)             Prognosis Prognosis for Safe Diet Advancement: Good      Swallow Study   General Date of Onset: 06/03/17 HPI: Carl Stephenson is a 72 y.o. male with a history of a total of 4  episodes of stroke all with multiple strokes on MRI.  His first stroke happened in October 2012 and involved the posterior corpus callosum on the left and felt to be a white matter lacune.  He then had a cardiac catheterization in 2015 with discoordination following the procedure.  He had embolic appearing strokes on MRI following this.  In July 2018 he had a pontine infarct as well as a Juxtacortical white matter infarct on the left.  Today he presents with sudden onset difficulty walking.  He states that he felt relatively normal before he sat down on the commode this afternoon.  Following sitting on the commode he tried to stand up but was unable to.  MRI is showing right frontal and left internal capsule infarct as well as numerous old infarcts.  Chest xray is showing mild pulmonary edema. Type of Study: Bedside Swallow Evaluation Previous Swallow Assessment: 12/29/2013 wtih recommendation fo dysphagia 3 wtih thin liquids.  Pt was eventually upgraded to regular and DC'd from [place].   Diet Prior to this Study: NPO History of Recent Intubation: No Behavior/Cognition: Alert;Cooperative Oral Cavity Assessment: Within Functional Limits Oral Care Completed by SLP: No Vision: Functional for self-feeding Self-Feeding Abilities: Needs assist Patient Positioning: Upright in bed Baseline Vocal Quality: Low vocal intensity Volitional Cough: Weak Volitional Swallow: Able to elicit    Oral/Motor/Sensory Function Overall Oral Motor/Sensory Function: Mild impairment Facial ROM: Reduced left;Suspected CN VII (facial) dysfunction Facial Symmetry: Abnormal symmetry left;Suspected CN VII (facial) dysfunction Facial Strength: Reduced left;Suspected CN VII (facial) dysfunction Lingual ROM: Within Functional Limits Lingual Symmetry: Within Functional Limits Lingual Strength: Reduced;Suspected CN XII (hypoglossal) dysfunction Mandible: Impaired;Suspected CN V (Trigeminal) dysfunction   Ice Chips Ice chips: Not  tested   Thin Liquid Thin Liquid: Impaired Presentation: Cup;Spoon;Self Fed Pharyngeal  Phase Impairments: Suspected delayed Swallow;Cough - Immediate    Nectar Thick Nectar Thick Liquid: Not tested   Honey Thick Honey Thick Liquid: Not tested   Puree Puree: Impaired Presentation: Spoon Pharyngeal Phase Impairments: Suspected delayed Swallow   Solid   GO   Solid: Not tested        Shelly Flatten, MA, CCC-SLP Acute Rehab SLP [phone] Lamar Sprinkles 06/04/2017,11:31 AM

## 2017-06-04 NOTE — ED Notes (Signed)
Dr. Kirkpatrick at bedside 

## 2017-06-04 NOTE — ED Notes (Signed)
Pt returned from CT °

## 2017-06-04 NOTE — ED Notes (Signed)
Patient transported to CT 

## 2017-06-04 NOTE — H&P (Signed)
Date: 06/04/2017               Patient Name:  Carl Stephenson MRN: [id number]  DOB: 1945-06-10 Age / Sex: 72 y.o., male   PCP: Myer Peer, MD         Medical Service: Internal Medicine Teaching Service         Attending Physician: Dr. Rebeca Alert Raynaldo Opitz, MD    First Contact: Dr. Berneice Gandy Pager: [phone]  Second Contact: Dr. Reesa Chew Pager: [phone]       After Hours (After 5p/  First Contact Pager: [phone]  weekends / holidays): Second Contact Pager: [phone]   Chief Complaint: Stroke  History of Present Illness: This is a 72 y.o. man with PMHx of prior strokes dating back to 2012 with resultant left sided weakness, CAD s/p PCI, HTN, HLD, DM, seizure, resting bradycardia who presented to the ED this morning with sudden onset diffulty walking.  He has received Ativan this morning for imaging studies and is still somewhat lethargic so most of history obtained via chart review as well as discussion with his wife and son-in-law.  Apparently patient was feeling normal earlier last evening before sitting down to use the bathroom.  FOllowing this, he was unable to stand up from his seated position.  His wife noted that he was weak on the right side and leaning towards the left.  He does have a loop recorder which have detected 5 episodes of atrial fibrillation since Oct 2018.  However, these have been evaluated by cardiology and actually felt to be sinus rhythm and not necessarily AF.  He underwent MRI in the ED that revealed multiple subcortical infarcts.  He is being admitted for further stroke work up today. Currently denies any symptoms of HA, SOB, CP, vision change, abdominal pain, lightheadedness, dizziness.   Meds:  Current Meds  Medication Sig  . acetaminophen (TYLENOL) 325 MG tablet Take 2 tablets (650 mg total) by mouth every 4 (four) hours as needed for headache or mild pain.  [name] aspirin EC 81 MG EC tablet Take 1 tablet (81 mg total) by mouth daily.  . clopidogrel (PLAVIX) 75 MG  tablet Take 1 tablet (75 mg total) by mouth daily with breakfast.  . fexofenadine (ALLEGRA) 180 MG tablet Take 180 mg by mouth daily.  . furosemide (LASIX) 40 MG tablet TAKE 1/2 TABLET BY MOUTH DAILY  . insulin detemir (LEVEMIR) 100 UNIT/ML injection Inject 70 Units into the skin daily with supper.  . levETIRAcetam (KEPPRA) 500 MG tablet Take 1 tablet (500 mg total) by mouth 2 (two) times daily.  . nitroGLYCERIN (NITROSTAT) 0.4 MG SL tablet PLACE 1 TABLET UNDER THE TONGUE EVERY 5 MINUTES AS NEEDED FOR CHEST PAIN  . pantoprazole (PROTONIX) 40 MG tablet Take 40 mg by mouth daily.    . potassium chloride SA (K-DUR,KLOR-CON) 20 MEQ tablet Take 1 tablet (20 mEq total) by mouth daily.  . sertraline (ZOLOFT) 100 MG tablet Take 1.5 tablets (150 mg total) by mouth daily. (Patient taking differently: Take 100 mg by mouth daily. )     Allergies: Allergies as of 06/03/2017 - Review Complete 04/21/2017  Allergen Reaction Noted  . Statins Nausea Only 02/09/2017  . Atorvastatin Other (See Comments) 04/07/2016  . Nitrofurantoin monohyd macro Hives and Rash 08/14/2010   Past Medical History:  Diagnosis Date  . Acute encephalopathy admitted 12/28/2013  . Bladder tumor    Recurrent  . Bradycardia 01/16/2016  . Cancer ([place])    "  had TURBT; cancer free since ~ 11/2013/MD" (12/27/2013)  . Carotid artery disease ([place])    [place] 12/15: Bilateral ICA 1-39 // [place] 1/18: Bilateral ICA 1-39  . Chronic lower back pain   . Coronary artery disease    a. Cath 12/27/2013 EF 35-40%, 80-90% prox LAD treated with balloon angioplasty and 3.0x28 mm Xience DES, LCx with anomalous origin, 50-60% stenosis in OM, 50% stenosis in PDA and PLA // b. [place] 1/18: mLAD stent ok, dLAD 80, RI stent ok, oLCx stent ok, OM1 85, oRCA stent ok, mRA 40, dRCA 40, RPDA 60/60 >> med Rx  . Diabetes mellitus type II   . GERD (gastroesophageal reflux disease)   . History of echocardiogram    Echo 12/15: Mild LVH, EF 45-50, inf HK, Gr 1 diastolic  dysfunction  // Echo 1/18: Moderate LVH, EF 45-50, diffuse HK, mild LAE, RA upper limits of normal // TEE 1/18:  EF 45, diffuse HK, frequent PVCs, trivial MR, mild LAE, normal RVSF, no LA or RA clot, + PFO   . History of hiatal hernia   . Hyperlipidemia   . Hypertension   . Inferior myocardial infarction ([place]) 1994   Inferior, PCI and Streptokinase  . Kidney stones    "passed them all" (12/27/2013)  . Obesity   . Seizure disorder as sequela of cerebrovascular accident ([place])   . Stroke [facility]) ~ 2012; 2018   multiple strokes // embolic CVA 0/2585 >> admx with encephalopathy >> s/p ILR  . TIA (transient ischemic attack) "several"  . Tobacco user    Quit 1994  . Wide-complex tachycardia ([place])     Family History: Heart disease in his father; Heart failure in his father.  Social History: Lives with wife, former smoker, no drugs, no alcohol.   Review of Systems: A complete ROS was negative except as per HPI.   Physical Exam: Blood pressure (!) 152/76, pulse 77, resp. rate 16, SpO2 92 %.  General: Patient is a well-developed and well-nourished, in no acute distress.  He is lethargic from earlier Ativan administration Head: Normocephalic and atraumatic. Eyes: PERRL, EOMI, conjunctivae normal, No scleral icterus.  Neck: Supple and trachea midline Cardiovascular: RRR Pulmonary/Chest: equal chest rise and fall, snoring breath sounds, able to cough and follow commands Abdominal: Soft, NTND Extremities: Trace edema Neurological: Alert but unable to participate in orientation.  He has no obvious focal deficits but his exam is hindered by his lethargy.  His grip strength is equal bilaterally and his plantarflexion testing appears to be weaker on the left Skin: Warm, dry and intact. No rashes or erythema. Psychiatric: Unable to assess   EKG: personally reviewed my interpretation is sinus rhythm, ventricular trigeminy.  No significant change.  CXR: personally reviewed my interpretation is  mild edema and stable cardiomegaly  Assessment & Plan by Problem: Principal Problem:   CVA (cerebral vascular accident) ([place]) Active Problems:   Hyperlipidemia   Diabetes mellitus ([place])   Convulsions ([place])   Chronic combined systolic and diastolic CHF (congestive heart failure) ([place])  CVA Presenting with acute weakness with MRI findings of new right frontal and left internal capsule infarcts.  He has history of prior multiple strokes.  Seen by neurology already in the ED.  Concerning for embolic disease versus an underlying arteriopathy.  He has a loop recorder that has not provided definite evidence of atrial fibrillation. - Check A1c, lipids - CTA head and neck - Frequent neuro checks - Echo - HIV, RPR - ASA and Plavix -  Telemetry - PT/OT/SLP  CAD s/p PCI - Currently on DAPT as above.  No evidence of chest pain and troponin negative here  Combined CHF Not on beta blocker likely due to his resting bradycardia.  He is not on ACE/ARB but not entirely sure as to the reasoning.  His creatinine and GFR are within range that he could tolerate this medication.  Last EF was about 45%.  Nonetheless, given acute CVA would not start antihypertensives right now to allow for more permissive HTN.  CXR does show some mild edema but otherwise his volume status appears stable.  His weight has yet to be recorded but he does not appear to be in exacerbation.  Home medication is Lasix 20mg  daily - Echo as above - Hold Lasix today, reassess daily to resume as this likely will not impact his blood pressure too significantly  HTN Not on any home medications.  Allowing permissive hypertension.  Hyperlipidemia Has been intolerant of statins in the past and being considered for PCSK9 therapy.  Prior notes also suggest starting Zetia - Lipid panel - Consider Zetia  Diabetes Home medication includes Levemir 70 units with supper.  Last A1c 7.19 Jan 2016 in our system.  Currently NPO awaiting swallow  evaluation.  Glucose on admission okay at 133 - Levemir 35 units with supper.  Can increase as needed based on diet order - Sliding scale sensitive with HS coverage - CBG monitoring - A1c  History of Seizure - Continue Keppra  History of Depression - Continue Zoloft  FEN Fluids: None Electrolytes: monitor Nutrition: NPO awaiting swallow eval  DVT PPX: lovenox  CODE: FULL    Dispo: Admit patient to Inpatient with expected length of stay greater than 2 midnights.  SignedJule Ser, DO 06/04/2017, 11:07 AM  Pager: [phone]

## 2017-06-05 ENCOUNTER — Inpatient Hospital Stay (HOSPITAL_COMMUNITY): Payer: Medicare Other

## 2017-06-05 DIAGNOSIS — F329 Major depressive disorder, single episode, unspecified: Secondary | ICD-10-CM

## 2017-06-05 DIAGNOSIS — I6340 Cerebral infarction due to embolism of unspecified cerebral artery: Secondary | ICD-10-CM

## 2017-06-05 DIAGNOSIS — G40909 Epilepsy, unspecified, not intractable, without status epilepticus: Secondary | ICD-10-CM

## 2017-06-05 DIAGNOSIS — I5040 Unspecified combined systolic (congestive) and diastolic (congestive) heart failure: Secondary | ICD-10-CM

## 2017-06-05 DIAGNOSIS — I2510 Atherosclerotic heart disease of native coronary artery without angina pectoris: Secondary | ICD-10-CM

## 2017-06-05 DIAGNOSIS — Z794 Long term (current) use of insulin: Secondary | ICD-10-CM

## 2017-06-05 DIAGNOSIS — I110 Hypertensive heart disease with heart failure: Secondary | ICD-10-CM

## 2017-06-05 DIAGNOSIS — E785 Hyperlipidemia, unspecified: Secondary | ICD-10-CM

## 2017-06-05 DIAGNOSIS — Z79899 Other long term (current) drug therapy: Secondary | ICD-10-CM

## 2017-06-05 LAB — BASIC METABOLIC PANEL
Anion gap: 8 (ref 5–15)
BUN: 20 mg/dL (ref 6–20)
CO2: 26 mmol/L (ref 22–32)
Calcium: 8.8 mg/dL — ABNORMAL LOW (ref 8.9–10.3)
Chloride: 106 mmol/L (ref 101–111)
Creatinine, Ser: 1.18 mg/dL (ref 0.61–1.24)
GFR calc Af Amer: >60 mL/min (ref >60)
GFR calc non Af Amer: 60 mL/min — ABNORMAL LOW (ref >60)
Glucose, Bld: 107 mg/dL — ABNORMAL HIGH (ref 65–99)
Potassium: 3.5 mmol/L (ref 3.5–5.1)
Sodium: 140 mmol/L (ref 135–145)

## 2017-06-05 LAB — HEMOGLOBIN A1C
Hgb A1c MFr Bld: 5.8 % — ABNORMAL HIGH (ref 4.8–5.6)
Mean Plasma Glucose: 119.76 mg/dL

## 2017-06-05 LAB — GLUCOSE, CAPILLARY
Glucose-Capillary: 118 mg/dL — ABNORMAL HIGH (ref 65–99)
Glucose-Capillary: 146 mg/dL — ABNORMAL HIGH (ref 65–99)
Glucose-Capillary: 121 mg/dL — ABNORMAL HIGH (ref 65–99)
Glucose-Capillary: 117 mg/dL — ABNORMAL HIGH (ref 65–99)

## 2017-06-05 LAB — URINE CULTURE: Culture: NO GROWTH

## 2017-06-05 LAB — RPR: RPR: NONREACTIVE

## 2017-06-05 LAB — ECHOCARDIOGRAM COMPLETE
Height: 66 in
Weight: 2920.65 oz

## 2017-06-05 LAB — LIPID PANEL
Cholesterol: 218 mg/dL — ABNORMAL HIGH (ref 0–200)
HDL: 28 mg/dL — ABNORMAL LOW (ref >40)
LDL Cholesterol: 143 mg/dL — ABNORMAL HIGH (ref 0–99)
Total CHOL/HDL Ratio: 7.8 RATIO
Triglycerides: 236 mg/dL — ABNORMAL HIGH (ref <150)
VLDL: 47 mg/dL — ABNORMAL HIGH (ref 0–40)

## 2017-06-05 LAB — HIV ANTIBODY (ROUTINE TESTING W REFLEX): HIV Screen 4th Generation wRfx: NONREACTIVE

## 2017-06-05 MED ORDER — PERFLUTREN LIPID MICROSPHERE
INTRAVENOUS | Status: AC
  Administered 2017-06-05: 4 mL via INTRAVENOUS
  Filled 2017-06-05: qty 10

## 2017-06-05 MED ORDER — FUROSEMIDE 20 MG PO TABS
20.0000 mg | ORAL_TABLET | Freq: Every day | ORAL | Status: DC
  Administered 2017-06-05 – 2017-06-07 (×3): 20 mg via ORAL
  Filled 2017-06-05 (×3): qty 1

## 2017-06-05 MED ORDER — EZETIMIBE 10 MG PO TABS
10.0000 mg | ORAL_TABLET | Freq: Every day | ORAL | Status: DC
  Administered 2017-06-05 – 2017-06-07 (×3): 10 mg via ORAL
  Filled 2017-06-05 (×3): qty 1

## 2017-06-05 MED ORDER — PERFLUTREN LIPID MICROSPHERE
1.0000 mL | INTRAVENOUS | Status: AC | PRN
  Administered 2017-06-05: 4 mL via INTRAVENOUS
  Filled 2017-06-05: qty 10

## 2017-06-05 NOTE — Discharge Instructions (Signed)
Information on my medicine - ELIQUIS® (apixaban) ° °This medication education was reviewed with me or my healthcare representative as part of my discharge preparation.   ° ° °Why was Eliquis® prescribed for you? °Eliquis® was prescribed for you to reduce the risk of a blood clot forming that can cause a stroke. ° °What do You need to know about Eliquis® ? °Take your Eliquis® TWICE DAILY - one tablet in the morning and one tablet in the evening with or without food. If you have difficulty swallowing the tablet whole please discuss with your pharmacist how to take the medication safely. ° °Take Eliquis® exactly as prescribed by your doctor and DO NOT stop taking Eliquis® without talking to the doctor who prescribed the medication.  Stopping may increase your risk of developing a stroke.  Refill your prescription before you run out. ° °After discharge, you should have regular check-up appointments with your healthcare provider that is prescribing your Eliquis®.  In the future your dose may need to be changed if your kidney function or weight changes by a significant amount or as you get older. ° °What do you do if you miss a dose? °If you miss a dose, take it as soon as you remember on the same day and resume taking twice daily.  Do not take more than one dose of ELIQUIS at the same time to make up a missed dose. ° °Important Safety Information °A possible side effect of Eliquis® is bleeding. You should call your healthcare provider right away if you experience any of the following: °Bleeding from an injury or your nose that does not stop. °Unusual colored urine (red or dark brown) or unusual colored stools (red or black). °Unusual bruising for unknown reasons. °A serious fall or if you hit your head (even if there is no bleeding). ° °Some medicines may interact with Eliquis® and might increase your risk of bleeding or clotting while on Eliquis®. To help avoid this, consult your healthcare provider or pharmacist prior  to using any new prescription or non-prescription medications, including herbals, vitamins, non-steroidal anti-inflammatory drugs (NSAIDs) and supplements. ° °This website has more information on Eliquis® (apixaban): http://www.eliquis.com/eliquis/home  °

## 2017-06-05 NOTE — Progress Notes (Signed)
  Echocardiogram 2D Echocardiogram has been performed.  Johny Chess 06/05/2017, 4:48 PM

## 2017-06-05 NOTE — Progress Notes (Signed)
  Speech Language Pathology Treatment: Dysphagia  Patient Details Name: Carl Stephenson MRN: [id number] DOB: [birth year] Today's Date: 06/05/2017 Time: 5462-7035 SLP Time Calculation (min) (ACUTE ONLY): 13 min  Assessment / Plan / Recommendation Clinical Impression  SLP provided skilled treatment targeting dysphagia as pt consumed items from his dinner tray consisting of dys 3, thin liquid textures. SLP assisted pt with performing oral care prior to PO, providing education re: rationale per MBS yesterday to pt and his wife. Pt self fed solids and thin liquids (assist required for set-up). SLP demo'd appropriate cuing for cough/reswallow to pt's wife, demonstrating breaking down multi-step commands into single steps for improved success. Pt successfully performing cough/reswallow when instructed step-by-step. No coughing aside from volitional cough as compensatory measure; pt appears to be tolerating dys 3 thin liquids at this time. Will continue to follow for tolerance, pt/caregiver training in aspiration precautions and compensatory techniques.    HPI HPI: Carl Stephenson is a 72 y.o. male with a history of a total of 4 episodes of stroke all with multiple strokes on MRI.  His first stroke happened in October 2012 and involved the posterior corpus callosum on the left and felt to be a white matter lacune.  He then had a cardiac catheterization in 2015 with discoordination following the procedure.  He had embolic appearing strokes on MRI following this.  In July 2018 he had a pontine infarct as well as a Juxtacortical white matter infarct on the left.  Today he presents with sudden onset difficulty walking.  He states that he felt relatively normal before he sat down on the commode this afternoon.  Following sitting on the commode he tried to stand up but was unable to.  MRI is showing right frontal and left internal capsule infarct as well as numerous old infarcts.  Chest xray is showing mild  pulmonary edema.      SLP Plan  Continue with current plan of care  Patient needs continued Speech Lanaguage Pathology Services    Recommendations  Diet recommendations: Dysphagia 3 (mechanical soft);Thin liquid Liquids provided via: Cup;No straw Medication Administration: Crushed with puree Supervision: Patient able to self feed;Full supervision/cueing for compensatory strategies Compensations: Slow rate;Small sips/bites;Follow solids with liquid(cough, reswallow with liquids)                Oral Care Recommendations: Oral care before and after PO Follow up Recommendations: Other (comment)(tbd) SLP Visit Diagnosis: Dysphagia, oropharyngeal phase (R13.12) Plan: Continue with current plan of care       [place], [place], [place] Speech-Language Pathologist [place] 06/05/2017, 5:59 PM

## 2017-06-05 NOTE — Progress Notes (Signed)
Internal Medicine Attending:   I saw and examined the patient. I reviewed the resident's note and I agree with the resident's findings and plan as documented in the resident's note.  Acute CVA from presumed embolic source, stable today with mild left sided weakness. He is a mild aspiration risk according to speech therapy, but eating breakfast without problem today. I agree with plan to start eliquis today. Awaiting PT and OT consultations to guide disposition.

## 2017-06-05 NOTE — Evaluation (Signed)
Speech Language Pathology Evaluation Patient Details Name: Carl Stephenson MRN: [id number] DOB: October 24, 1945 Today's Date: 06/05/2017 Time: 1245-8099 SLP Time Calculation (min) (ACUTE ONLY): 25 min  Problem List:  Patient Active Problem List   Diagnosis Date Noted  . Weakness 09/07/2016  . Gait abnormality 09/07/2016  . Chronic combined systolic and diastolic CHF (congestive heart failure) ([place]) 02/18/2016  . History of embolic stroke 83/38/2505  . Bradycardia 01/16/2016  . Abnormal EKG   . LBBB (left bundle branch block)   . Coronary artery disease involving native coronary artery of native heart without angina pectoris   . ECG abnormality   . History of CVA (cerebrovascular accident)   . Lactic acidosis   . Convulsions ([place])   . Hypotension 01/16/2014  . CVA (cerebral vascular accident) ([place]) 12/29/2013  . Acute encephalopathy 12/28/2013  . Seborrheic dermatitis 10/04/2012  . Microcytosis 11/07/2011  . Aphasia 06/26/2011  . 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 Cerebral thrombosis with cerebral infarction ([place]) 08/14/2010  . Temporary cerebral vascular dysfunction 10/24/2009  . BENIGN NEOPLASM OF BLADDER 03/16/2008  . Hyperlipidemia 03/16/2008  . Obesity 03/16/2008  . Essential hypertension 03/16/2008  . MYOCARDIAL INFARCTION 03/16/2008  . UNSPECIFIED PAROXYSMAL TACHYCARDIA 03/16/2008  . GERD 03/16/2008  . Diabetes mellitus ([place]) 03/16/2008   Past Medical History:  Past Medical History:  Diagnosis Date  . Acute encephalopathy admitted 12/28/2013  . Bladder tumor    Recurrent  . Bradycardia 01/16/2016  . Cancer [facility])    "had TURBT; cancer free since ~ 11/2013/MD" (12/27/2013)  . Carotid artery disease ([place])    [place] 12/15: Bilateral ICA 1-39 // [place] 1/18: Bilateral ICA 1-39  . Chronic lower back pain   . Coronary artery disease    a. Cath 12/27/2013 EF 35-40%, 80-90% prox LAD treated with balloon angioplasty and 3.0x28 mm Xience DES, LCx with anomalous  origin, 50-60% stenosis in OM, 50% stenosis in PDA and PLA // b. [place] 1/18: mLAD stent ok, dLAD 80, RI stent ok, oLCx stent ok, OM1 85, oRCA stent ok, mRA 40, dRCA 40, RPDA 60/60 >> med Rx  . Diabetes mellitus type II   . GERD (gastroesophageal reflux disease)   . History of echocardiogram    Echo 12/15: Mild LVH, EF 45-50, inf HK, Gr 1 diastolic dysfunction  // Echo 1/18: Moderate LVH, EF 45-50, diffuse HK, mild LAE, RA upper limits of normal // TEE 1/18:  EF 45, diffuse HK, frequent PVCs, trivial MR, mild LAE, normal RVSF, no LA or RA clot, + PFO   . History of hiatal hernia   . Hyperlipidemia   . Hypertension   . Inferior myocardial infarction ([place]) 1994   Inferior, PCI and Streptokinase  . Kidney stones    "passed them all" (12/27/2013)  . Obesity   . Seizure disorder as sequela of cerebrovascular accident ([place])   . Stroke [facility]) ~ 2012; 2018   multiple strokes // embolic CVA 03/9765 >> admx with encephalopathy >> s/p ILR  . TIA (transient ischemic attack) "several"  . Tobacco user    Quit 1994  . Wide-complex tachycardia ([place])    Past Surgical History:  Past Surgical History:  Procedure Laterality Date  . CARDIAC CATHETERIZATION     "he's had a couple before today where they just looked around" (12/27/2013)  . CARDIAC CATHETERIZATION N/A 01/15/2016   Procedure: Left Heart Cath and Coronary Angiography;  Surgeon: Peter M Martinique, MD;  Location: [place] CV LAB;  Service: Cardiovascular;  Laterality: N/A;  .  CORONARY ANGIOPLASTY WITH STENT PLACEMENT     "he's had 3-4 stents put in before today"   . CORONARY ANGIOPLASTY WITH STENT PLACEMENT  12/27/2013   "1"  . EP IMPLANTABLE DEVICE N/A 01/20/2016   Procedure: Loop Recorder Insertion;  Surgeon: Evans Lance, MD;  Location: [place] CV LAB;  Service: Cardiovascular;  Laterality: N/A;  . LEFT HEART CATHETERIZATION WITH CORONARY ANGIOGRAM N/A 10/02/2011   Procedure: LEFT HEART CATHETERIZATION WITH CORONARY ANGIOGRAM;  Surgeon: [name] Bow, MD;  Location[facility] CATH LAB;  Service: Cardiovascular;  Laterality: N/A;  . LEFT HEART CATHETERIZATION WITH CORONARY ANGIOGRAM N/A 12/27/2013   Procedure: LEFT HEART CATHETERIZATION WITH CORONARY ANGIOGRAM;  Surgeon: Blane Ohara, MD;  Location: [facility] CATH LAB;  Service: Cardiovascular;  Laterality: N/A;  . TEE WITHOUT CARDIOVERSION N/A 01/20/2016   Procedure: TRANSESOPHAGEAL ECHOCARDIOGRAM (TEE);  Surgeon: Larey Dresser, MD;  Location: [facility] ENDOSCOPY;  Service: Cardiovascular;  Laterality: N/A;  . TRANSURETHRAL RESECTION OF BLADDER TUMOR WITH GYRUS (TURBT-GYRUS)  "several times"   HPI:  Carl Stephenson is a 72 y.o. male with a history of a total of 4 episodes of stroke all with multiple strokes on MRI.  His first stroke happened in October 2012 and involved the posterior corpus callosum on the left and felt to be a white matter lacune.  He then had a cardiac catheterization in 2015 with discoordination following the procedure.  He had embolic appearing strokes on MRI following this.  In July 2018 he had a pontine infarct as well as a Juxtacortical white matter infarct on the left.  Today he presents with sudden onset difficulty walking.  He states that he felt relatively normal before he sat down on the commode this afternoon.  Following sitting on the commode he tried to stand up but was unable to.  MRI is showing right frontal and left internal capsule infarct as well as numerous old infarcts.  Chest xray is showing mild pulmonary edema.   Assessment / Plan / Recommendation Clinical Impression   Patient presents with significant cognitive linguistic impairments and a decline in function from his baseline. Pt unaware of deficits, however wife reports she has noticed increased difficulty with memory, decrease in vocal intensity and that pt is "slow to respond." Administered MOCA Basic; pt scored 3/30 which is consistent with severe cognitive impairments (>26 is considered WNL). Pt with slow  processing, impaired working memory and sustained attention contributing to diminished problem solving, verbal fluency, reasoning and visuoperception. Pt's ability to follow single step commands is quite good with extended time, though accuracy decreases with increased length or additional components to commands. Pt required assistance for many ADLs prior to admission. Wife states her goal is for pt to be able to express his feelings and needs. I recommend skilled ST to address cognition within a functional context to improve pt's communication of wants and needs and his ability to follow commands in daily activities. He may also benefit from intensity-based vocal retraining to improve his intelligibility, which is [facility] in a quiet environment but would likely be significantly reduced in a noisier environment. Will follow acutely.    SLP Assessment  SLP Recommendation/Assessment: Patient needs continued Speech Lanaguage Pathology Services SLP Visit Diagnosis: Cognitive communication deficit (R41.841);Dysarthria and anarthria (R47.1)    Follow Up Recommendations  Other (comment)(tbd)    Frequency and Duration min 2x/week  2 weeks      SLP Evaluation Cognition  Overall Cognitive Status: Impaired/Different from baseline Arousal/Alertness: Awake/alert  Orientation Level: Oriented to person;Disoriented to place;Disoriented to time;Disoriented to situation("am I at the governor's house?") Attention: Focused;Sustained Focused Attention: Appears intact Sustained Attention: Impaired Sustained Attention Impairment: Verbal basic;Functional basic Memory: Impaired Memory Impairment: Storage deficit;Decreased recall of new information;Decreased short term memory Decreased Short Term Memory: Verbal basic;Functional basic Awareness: Impaired Awareness Impairment: Intellectual impairment;Emergent impairment;Anticipatory impairment Problem Solving: Impaired Problem Solving Impairment: Verbal basic;Functional  basic Executive Function: Reasoning;Organizing;Initiating;Sequencing Reasoning: Impaired Reasoning Impairment: Verbal basic;Functional basic Sequencing: Impaired Sequencing Impairment: Verbal basic;Functional basic Organizing: Impaired Organizing Impairment: Verbal basic;Functional basic Initiating: Impaired Initiating Impairment: Verbal basic;Functional basic Safety/Judgment: Impaired       Comprehension  Auditory Comprehension Overall Auditory Comprehension: Impaired Yes/No Questions: Within Functional Limits Commands: Impaired One Step Basic Commands: 75-100% accurate Two Step Basic Commands: 50-74% accurate Multistep Basic Commands: 0-24% accurate Conversation: Simple Interfering Components: Attention;Processing speed;Working memory EffectiveTechniques: Slowed speech;Pausing;Repetition;Extra processing time;Visual/Gestural cues(information chunking) Reading Comprehension Reading Status: Not tested    Expression Expression Primary Mode of Expression: Verbal Verbal Expression Overall Verbal Expression: Impaired at baseline Initiation: Impaired Automatic Speech: Name Level of Generative/Spontaneous Verbalization: Sentence Repetition: Impaired Level of Impairment: Sentence level Naming: Impairment Confrontation: Impaired(1/4 black and white images) Divergent: 0-24% accurate(1 fruit in 60 seconds) Pragmatics: Impairment Impairments: Monotone Interfering Components: Attention;Premorbid deficit Effective Techniques: Open ended questions;Semantic cues;Sentence completion Non-Verbal Means of Communication: Not applicable Written Expression Dominant Hand: Right Written Expression: Not tested   Oral / Motor  Oral Motor/Sensory Function Overall Oral Motor/Sensory Function: Mild impairment Facial ROM: Reduced left;Suspected CN VII (facial) dysfunction Facial Symmetry: Abnormal symmetry left;Suspected CN VII (facial) dysfunction Facial Strength: Reduced left;Suspected CN VII  (facial) dysfunction Lingual ROM: Within Functional Limits Lingual Symmetry: Within Functional Limits Lingual Strength: Reduced;Suspected CN XII (hypoglossal) dysfunction Mandible: Impaired;Suspected CN V (Trigeminal) dysfunction Motor Speech Overall Motor Speech: Impaired Respiration: Within functional limits Phonation: Low vocal intensity Resonance: Within functional limits Articulation: Impaired Level of Impairment: Sentence Intelligibility: Intelligible(in quiet environment) Motor Planning: Witnin functional limits Interfering Components: Premorbid status Effective Techniques: Increased vocal intensity   GO                   Carl Stephenson, [place], CCC-SLP Speech-Language Pathologist [phone]  Carl Stephenson 06/05/2017, 5:45 PM

## 2017-06-05 NOTE — Evaluation (Signed)
Physical Therapy Evaluation Patient Details Name: Carl Stephenson MRN: [id number] DOB: [birth year] Today's Date: 06/05/2017   History of Present Illness  Pt is a 72 y.o. male admitted 06/03/17 with new onset weakness with difficulty walking. MRI revealed R frontal and L internal capsule infarcts; acute/subacute R punctate frontal infarct; numerous old small supra and infratentorial infarcts. PMH includes multiple prior CVAs, HTN, CAD, DMII, chronic LBP.  Clinical Impression  Pt presented supine in bed with HOB elevated, awake and willing to participate in therapy session. Pt's spouse and daughter present throughout session as well. Prior to admission, pt reported that he ambulated with use of RW/rollator and required assistance from an aide for ADLs. Pt currently requires heavy physical assistance of two for bed mobility and transfers with RW. Pt unable to take any steps or advance either LE in any direction during evaluation. Pt would continue to benefit from skilled physical therapy services at this time while admitted and after d/c to address the below listed limitations in order to improve overall safety and independence with functional mobility.     Follow Up Recommendations SNF    Equipment Recommendations  None recommended by PT    Recommendations for Other Services       Precautions / Restrictions Precautions Precautions: Fall Restrictions Weight Bearing Restrictions: No      Mobility  Bed Mobility Overal bed mobility: Needs Assistance Bed Mobility: Supine to Sit;Sit to Supine     Supine to sit: Max assist;+2 for physical assistance Sit to supine: Total assist;+2 for physical assistance   General bed mobility comments: increased time and effort, pt able to initiate bilateral LE movement, attempted to use bed rail with bilateral UEs, use of bed pads to position pt's hips at EOB  Transfers Overall transfer level: Needs assistance Equipment used: Rolling walker (2  wheeled) Transfers: Sit to/from Stand Sit to Stand: From elevated surface;Min assist;+2 physical assistance;+2 safety/equipment         General transfer comment: increased time and effort, assist to power into standing  Ambulation/Gait             General Gait Details: pt unable to advance either LE forwards or laterally to take side steps at [facility]  Stairs            Wheelchair Mobility    Modified Rankin (Stroke Patients Only) Modified Rankin (Stroke Patients Only) Pre-Morbid Rankin Score: Moderately severe disability Modified Rankin: Severe disability     Balance Overall balance assessment: Needs assistance Sitting-balance support: Feet supported;Bilateral upper extremity supported;Single extremity supported Sitting balance-[name]: Poor     Standing balance support: During functional activity;Bilateral upper extremity supported Standing balance-[name]: Poor                               Pertinent Vitals/Pain Pain Assessment: Faces Faces Pain Scale: Hurts a little bit Pain Location: L hip Pain Descriptors / Indicators: Sore Pain Intervention(s): Monitored during session;Repositioned    Home Living Family/patient expects to be discharged to:: Skilled nursing facility Living Arrangements: Spouse/significant other                    Prior Function Level of Independence: Needs assistance   Gait / Transfers Assistance Needed: ambulates with RW/rollator  ADL's / Homemaking Assistance Needed: requires assistance from an aide for ADLs        Hand Dominance   Dominant Hand: Right    Extremity/Trunk  Assessment   Upper Extremity Assessment Upper Extremity Assessment: Defer to OT evaluation    Lower Extremity Assessment Lower Extremity Assessment: Generalized weakness       Communication   Communication: No difficulties  Cognition Arousal/Alertness: Awake/alert Behavior During Therapy: WFL for tasks  assessed/performed Overall Cognitive Status: Impaired/Different from baseline Area of Impairment: Following commands;Safety/judgement;Problem solving                       Following Commands: Follows one step commands consistently Safety/Judgement: Decreased awareness of safety;Decreased awareness of deficits   Problem Solving: Slow processing;Requires verbal cues;Requires tactile cues        General Comments      Exercises     Assessment/Plan    PT Assessment Patient needs continued PT services  PT Problem List Decreased strength;Decreased range of motion;Decreased activity tolerance;Decreased balance;Decreased mobility;Decreased coordination;Decreased cognition;Decreased knowledge of use of DME;Decreased safety awareness;Decreased knowledge of precautions       PT Treatment Interventions DME instruction;Stair training;Gait training;Functional mobility training;Therapeutic activities;Therapeutic exercise;Balance training;Neuromuscular re-education;Patient/family education    PT Goals (Current goals can be found in the Care Plan section)  Acute Rehab PT Goals Patient Stated Goal: return to PLOF PT Goal Formulation: With patient Time For Goal Achievement: 06/19/17 Potential to Achieve Goals: Fair    Frequency Min 3X/week   Barriers to discharge        Co-evaluation PT/OT/SLP Co-Evaluation/Treatment: Yes Reason for Co-Treatment: Complexity of the patient's impairments (multi-system involvement);For patient/therapist safety;To address functional/ADL transfers PT goals addressed during session: Mobility/safety with mobility;Balance;Proper use of DME;Strengthening/ROM         AM-PAC PT "6 Clicks" Daily Activity  Outcome Measure Difficulty turning over in bed (including adjusting bedclothes, sheets and blankets)?: Unable Difficulty moving from lying on back to sitting on the side of the bed? : Unable Difficulty sitting down on and standing up from a chair with  arms (e.g., wheelchair, bedside commode, etc,.)?: Unable Help needed moving to and from a bed to chair (including a wheelchair)?: A Lot Help needed walking in hospital room?: Total Help needed climbing 3-5 steps with a railing? : Total 6 Click Score: 7    End of Session Equipment Utilized During Treatment: Gait belt Activity Tolerance: Patient limited by fatigue Patient left: in bed;with call bell/phone within reach;with bed alarm set;with family/visitor present Nurse Communication: Mobility status PT Visit Diagnosis: Other abnormalities of gait and mobility (R26.89);Muscle weakness (generalized) (M62.81);Other symptoms and signs involving the nervous system (R29.898)    Time: 3710-6269 PT Time Calculation (min) (ACUTE ONLY): 20 min   Charges:   PT Evaluation $PT Eval Moderate Complexity: 1 Mod     PT G Codes:        [place], PT, DPT [place] 06/05/2017, 1:43 PM

## 2017-06-05 NOTE — Progress Notes (Signed)
   Subjective: Patient was feeling better when seen this morning.  Denies any new deficit, stating that his left-sided weakness is improving.  He was eating his breakfast. He understands the need of starting anticoagulation and was agreeable with that.  Objective:  Vital signs in last 24 hours: Vitals:   06/04/17 2029 06/04/17 2306 06/05/17 0347 06/05/17 0754  BP: (!) 154/71 (!) 146/64 (!) 166/62 (!) 156/64  Pulse: (!) 59 60 (!) 57 (!) 43  Resp: 17 18 18 20   Temp: 97.9 F (36.6 C) 98.1 F (36.7 C) 98.2 F (36.8 C) 98.5 F (36.9 C)  TempSrc: Oral Oral Oral Oral  SpO2: 95% 95% 97% 95%  Weight:  182 lb 8.7 oz (82.8 kg)    Height:       General.  Well-developed, well-nourished gentleman, in no acute distress. Lungs.  Clear bilaterally. CV.  Regular rate and rhythm. Abdomen.  Soft, nontender, bowel sounds positive. Neuro.  Alert and oriented, mild left-sided facial asymmetry, mildly decreased strength on left lower extremity, rest of the exam was nonfocal. Extremities.  No edema, no cyanosis, pulses intact and symmetrical.  Assessment/Plan:  CVA.  Patient with history of multiple cryptogenic infarct with new infarct in right frontal and left internal capsule.  CTA with high-grade stenosis of right M1 and proximal PCA.  Patient was on dual antiplatelet and continue to get new strokes.  No definitive evidence of atrial fibrillation. Speech evaluation with oropharyngeal dysphagia-recommending dysphagia 3 diet. -He was started on Eliquis by neurology. -We will discontinue aspirin. -Continue Plavix. -Echo pending. PT OT evaluation pending. -Unable to tolerate statin in the past with lipid panel shows total cholesterol of 218, HDL of 38 and LDL of 143. -Can be discharged after completion of stroke work-up.  CAD s/p PCI.  No evidence of chest pain or elevated troponin. Patient was on DAPT. -We will discontinue aspirin as he was started on Eliquis to decrease the risk of  bleeding.  HTN.  Blood pressure mildly elevated at 156/71. Currently not on any antihypertensives due to permissive hypertension.  Can be restarted on discharge.  Combined CHF.  Previous ejection fraction of 45%, repeat echo still pending. He was not on any beta-blocker most likely due to his resting bradycardia. No ACE/ARB either-not sure about the reason.  Renal function within normal limit. Appears euvolemic on exam. -Restart home Lasix 20 mg daily.  Hyperlipidemia Has been intolerant of statins in the past and being considered for PCSK9 therapy.  Prior notes also suggest starting Zetia. Lipid profile with total cholesterol of 218, triglyceride of 236, HDL of 38 and LDL of 143. -Can try Zetia.  Diabetes.  A1c of 5.8, improved from previous check of 7.7, with CBG remained between 115-168 over the last 24-hour. -Continue Lantus 35 unit at bedtime. -Continue SSI with at bedtime coverage.  History of Seizure - Continue Keppra  History of Depression - Continue Zoloft  Dispo: Anticipated discharge in approximately 0-1 day(s).   Lorella Nimrod, MD 06/05/2017, 10:30 AM Pager: [phone]

## 2017-06-05 NOTE — Discharge Summary (Signed)
Name: Carl Stephenson MRN: [id number] DOB: 30-Apr-1945 72 y.o. PCP: Myer Peer, MD  Date of Admission: 06/03/2017 10:47 PM Date of Discharge: 06/07/2017 Attending Physician: Axel Filler, *  Discharge Diagnosis:  Principal Problem:   CVA (cerebral vascular accident) [facility]) Active Problems:   Hyperlipidemia   Essential hypertension   Diabetes mellitus ([place])   Seizures ([place])   Chronic combined systolic and diastolic CHF (congestive heart failure) ([place])  Discharge Medications: Allergies as of 06/07/2017      Reactions   Statins Nausea Only   mylgia   Atorvastatin Other (See Comments)   Leg weakness   Nitrofurantoin Monohyd Macro Hives, Rash      Medication List    STOP taking these medications   aspirin 81 MG EC tablet     TAKE these medications   acetaminophen 325 MG tablet Commonly known as:  TYLENOL Take 2 tablets (650 mg total) by mouth every 4 (four) hours as needed for headache or mild pain.   apixaban 5 MG Tabs tablet Commonly known as:  ELIQUIS Take 1 tablet (5 mg total) by mouth 2 (two) times daily.   clopidogrel 75 MG tablet Commonly known as:  PLAVIX Take 1 tablet (75 mg total) by mouth daily with breakfast.   ezetimibe 10 MG tablet Commonly known as:  ZETIA Take 1 tablet (10 mg total) by mouth daily. Start taking on:  06/08/2017   fexofenadine 180 MG tablet Commonly known as:  ALLEGRA Take 180 mg by mouth daily.   furosemide 40 MG tablet Commonly known as:  LASIX TAKE 1/2 TABLET BY MOUTH DAILY   insulin detemir 100 UNIT/ML injection Commonly known as:  LEVEMIR Inject 0.35 mLs (35 Units total) into the skin daily with supper. What changed:  how much to take   levETIRAcetam 500 MG tablet Commonly known as:  KEPPRA Take 1 tablet (500 mg total) by mouth 2 (two) times daily.   lisinopril 10 MG tablet Commonly known as:  PRINIVIL,ZESTRIL Take 1 tablet (10 mg total) by mouth daily. Start taking on:  06/08/2017   nitroGLYCERIN  0.4 MG SL tablet Commonly known as:  NITROSTAT PLACE 1 TABLET UNDER THE TONGUE EVERY 5 MINUTES AS NEEDED FOR CHEST PAIN   pantoprazole 40 MG tablet Commonly known as:  PROTONIX Take 40 mg by mouth daily.   potassium chloride SA 20 MEQ tablet Commonly known as:  K-DUR,KLOR-CON Take 1 tablet (20 mEq total) by mouth daily.   sertraline 100 MG tablet Commonly known as:  ZOLOFT Take 1.5 tablets (150 mg total) by mouth daily. What changed:  how much to take       Disposition and follow-up:   Carl Stephenson was discharged from [facility] in Stable condition.  At the hospital follow up visit please address:  1.  Patient was admitted with recurrent stroke that did not cause any new, significant functional defects.   He was started on Eliquis 5 mg BID for additional anticoagulation/risk reduction as the current stroke occurred while patient taking dual anti-platelet therapy. Please assess compliance with Eliquis, any new neurologic deficits, and any concern for bleeding.  Patient was also started on Zetia, 10 mg a day as the patient's lipid profile revealed hyperlipidemia and patient intolerant of statin therapy in the past. Please assess compliance with this regimen and/or consider addition of PCSK9 therapy.   SLP evaluation suggested dysphagia at baseline and recommended initiation of a dysphagia 2 diet. Please assess compliance with this change  and consider reevaluation after acute rehab stay. Patient discharged from hospital for further PT/OT therapy at skilled nursing facility. Please assess if patient needs additional home health services after SNF discharge.   2.  Labs / imaging needed at time of follow-up: None  3.  Pending labs/ test needing follow-up: None  Follow-up Appointments: Follow-up Information    Garvin Fila, MD. Schedule an appointment as soon as possible for a visit in 4 week(s).   Specialties:  Neurology, Radiology Contact  information: [address] [place] [address] [phone]        Myer Peer, MD. Schedule an appointment as soon as possible for a visit.   Specialty:  Family Medicine Why:  Please make appointment within 1-2 weeks of discharge to discuss your hospitalization and multiple medication changes made during your hospital stay. Contact information: [place] [id number] [place] Hospital Course by problem list:  New right frontal and left internal capsule infarcts: Patient presented with sudden onset difficulty walking and was found to have new, multiple areas of small infarcts on Brain MRI consistent with recurrent stroke. Patient with history of cryptogenic strokes that resulted in residual left sided weakness and no additional functional deficits were observed on admission as a result of newly identified stroke foci. Prior to admission patient was on dual antiplatelet therapy for history of stroke, CAD with PCI (aspirin 81 mg, plavix 75 mg daily), and possible underlying atrial fibrillation. Neurology consult recommended escalating his anti-coagulation therapy as the patient's new stroke occurred in the setting of dual anti-platelet therapy. He was subsequently started on Eliquis 5 mg BID and his home aspirin 81 mg was discontinued to decrease the risk of bleeding. Patient's clopidogrel was continued on discharge. Neurology recommended ongoing risk factor modification, including optimizing medications for DM, HTN, and HLD, to further reduce the risk of recurrent stroke. Patient will follow in outpatient stroke clinic as an outpatient within 6 weeks of discharge.   Oropharyngeal dysphagia: Patient had preliminary swallow study given new stroke and there was some concern for aspiration at baseline. It is unclear if this is a new or chronic problem for the patient. Modified barium swallow study graded patient as mild aspiration risk and  dysphagia type 2 diet was started during hosptialization.   CAD s/p PCI in 2018: Patient was previously on aspirin 81 mg and plavix 75 mg given history of PCI and CAD. During the current hospitalization his aspirin 81 mg daily was discontinued as neurology recommended initiation of Eliquis 5 mg BID for further risk reduction. Patient is to remain on Plavix 75 mg daily in addition to Eliquis.   Combined CHF (LVEF 40-45% on 05/2017 echo), HTN: Patient was hypertensive on admission and remained hypertensive during first 48 hours of hospitalization. Patient is not on a BB as an outpatient 2/2 bradycardia, but patient's with low EF typically on ACE-inhibitor or ARB as outpatient given the mortality benefits of this medication in this patient population. Since there were no known contraindications to ACE-inhibitor/ARB therapy, patient was started on lisinopril 10 mg daily. The patient was normotensive on this newly started regimen while inpatient. He also remained without signs/symptoms of volume overload on regimen of home lasix 20 mg daily.  Hyperlipidemia: Patient with history of statin intolerance. LDL = 143, Total cholesterol = 218, HDL = 28. Patient needs diet and lifestyle modifications in addition to medications for risk factor optimization to  prevent future strokes. He was started on zetia 10 mg this admission with plans to continue on discharge.  Diabetes: Hemoglobin A1c = 5.8% on admission, indicating good control at baseline on home levimer 70 units daily. Patient likely can decrease his daily insulin requirements given A1C <6.0% as outpatient and good control on regimen of Lantus 35 units qHS and SSI TID with meals while inpatient (BG between 85 - 133 while inpatient).  Discharge Vitals:   BP 138/68 (BP Location: Right Arm)   Pulse 66   Temp 98.4 F (36.9 C) (Oral)   Resp 17   Ht 5\' 6"  (1.676 m)   Wt 182 lb 8.7 oz (82.8 kg)   SpO2 97%   BMI 29.46 kg/m   Pertinent Labs, Studies, and  Procedures:  CBC Latest Ref Rng & Units 06/03/2017 02/09/2017 01/16/2016  WBC 4.0 - 10.5 K/uL 9.2 10.5 8.7  Hemoglobin 13.0 - 17.0 g/dL 13.2 14.1 12.0(L)  Hematocrit 39.0 - 52.0 % 40.6 41.5 36.4(L)  Platelets 150 - 400 K/uL 172 190 160   BMP Latest Ref Rng & Units 06/05/2017 06/03/2017 02/09/2017  Glucose 65 - 99 mg/dL 107(H) 133(H) 142(H)  BUN 6 - 20 mg/dL 20 25(H) 19  Creatinine 0.61 - 1.24 mg/dL 1.18 1.22 1.24  BUN/Creat Ratio 10 - 24 - - 15  Sodium 135 - 145 mmol/L 140 141 142  Potassium 3.5 - 5.1 mmol/L 3.5 4.0 4.3  Chloride 101 - 111 mmol/L 106 107 103  CO2 22 - 32 mmol/L 26 25 25   Calcium 8.9 - 10.3 mg/dL 8.8(L) 8.7(L) 9.0   Lipid Panel     Component Value Date/Time   CHOL 218 (H) 06/05/2017 0233   CHOL 231 (H) 02/09/2017 1420   TRIG 236 (H) 06/05/2017 0233   HDL 28 (L) 06/05/2017 0233   HDL 30 (L) 02/09/2017 1420   CHOLHDL 7.8 06/05/2017 0233   VLDL 47 (H) 06/05/2017 0233   LDLCALC 143 (H) 06/05/2017 0233   LDLCALC 123 (H) 02/09/2017 1420   Urinalysis    Component Value Date/Time   COLORURINE YELLOW 06/04/2017 0335   APPEARANCEUR HAZY (A) 06/04/2017 0335   LABSPEC 1.020 06/04/2017 0335   PHURINE 6.0 06/04/2017 0335   GLUCOSEU NEGATIVE 06/04/2017 0335   HGBUR NEGATIVE 06/04/2017 0335   BILIRUBINUR NEGATIVE 06/04/2017 0335   KETONESUR NEGATIVE 06/04/2017 0335   PROTEINUR >=300 (A) 06/04/2017 0335   UROBILINOGEN 0.2 12/29/2013 0040   NITRITE NEGATIVE 06/04/2017 0335   LEUKOCYTESUR NEGATIVE 06/04/2017 0335   Hemoglobin A1c= 5.8% HIV = Nonreactive RPR = Nonreactive  CT head without contrast, 06/04/2017 IMPRESSION: 1. Atrophy with extensive small vessel ischemic changes of the white matter and multiple old lacunar infarcts in the pons, thalamus, and basal ganglia. Age indeterminate lacunar infarct in the left basal ganglia since the comparison CT. 2. Otherwise no CT evidence for acute intracranial abnormality.  MRI brain, 06/04/2017 IMPRESSION: 1. Acute  subcentimeter RIGHT frontal LEFT internal capsule infarct. Punctate RIGHT frontal acute versus subacute infarct. 2. 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 3. Moderate to severe chronic small vessel ischemic changes.  CTA head and neck, 06/04/2017  IMPRESSION: 1. High-grade stenosis proximal right M1 segment with asymmetric attenuation of right MCA branch vessels but no significant proximal branch vessel occlusion. This suggests collateral support to the right MCA branch vessels. 2. High-grade proximal PCA stenoses bilaterally with tandem left P2 segment high-grade stenosis and asymmetric attenuation of left PCA branch vessels. 3. Atherosclerotic changes at the carotid bifurcations and cavernous  internal carotid arteries bilaterally without other focal stenosis. 4. No other significant proximal branch vessel occlusion. 5. No intracranial aneurysm. 6. Multilevel spondylosis of the cervical spine as described. 7. Emphysema (ICD10-J43.9). Centrilobular and paraseptal emphysematous changes are noted at the lung apices. 8. Ground-glass attenuation at the lung apices likely reflects atelectasis or edema.  Echocardiography, 06/05/2017 Study Conclusions: - Procedure narrative: Transthoracic echocardiography. Image  quality was suboptimal. The study was technically difficult, as a result of poor patient compliance. Intravenous contrast   (Definity) was administered. - Left ventricle: The cavity size was normal. Wall thickness was   increased in a pattern of mild LVH. Systolic function was mildly   to moderately reduced. The estimated ejection fraction was in the   range of 40% to 45%. Incoordinate septal motion - possible   inferior hypokinesis. The study is not technically sufficient to   allow evaluation of LV diastolic function. - Mitral valve: Mildly thickened leaflets . There was trivial regurgitation. - Left atrium: The atrium was normal in size. - Systemic veins: Not  visualized.  Impressions: - Technically difficult study. LVEF 40-45%, mild LVH, possible   inferior hypokinesis with incoordinate septal motion, trivial MR,   normal LA size.  Discharge Instructions: Discharge Instructions    Ambulatory referral to Neurology   Complete by:  As directed    Pt will follow up with [name] at [facility] in about 2 months. Thanks.   Call MD for:  persistant dizziness or light-headedness   Complete by:  As directed    Call MD for:  severe uncontrolled pain   Complete by:  As directed    Call MD for:  temperature >100.4   Complete by:  As directed    DIET DYS 2   Complete by:  As directed    Please continue dysphagia 2 diet on discharge to SNF.   Fluid consistency:  Thin   Discharge instructions   Complete by:  As directed    You were evaluated for weakness and found to have recurrent stroke. To further reduce your risk of stroke you were started on new medications while you were in the hospital. Please start taking Eliquis 5 mg tablets twice a day to further reduce your risk of stroke. Because this medication is a blood thinner it increases your risk of bleeding. Given this medication change and risk of bleeding we want you to stop taking aspirin 81 mg daily. You will need to continue taking Plavix 75 mg daily, as this medication is important to prevent progression of heart disease and reduce risk of future stroke.   Your blood pressure was elevated while you were in the hospital, so you were started on a medication called lisinopril. This medication is good for your heart failure, blood pressure, and kidneys. Please continue taking one 10 mg tablet daily after leaving the hospital. Good blood pressure control will help further reduce your risk of stroke.   Your lipid levels were also elevated when you came to the hospital. We typically recommend taking medications, such as statins, to reduce your cholesterol and future risk of stroke. You were started on a  non-statin cholesterol medication, Zetia, while you were in the hospital. Please continue taking one 10 mg tablet daily to help with your cholesterol. Good control of your cholesterol will help prevent future strokes.   For your diabetes, please decrease the amount of insulin you take on a daily basis. You are doing great with your blood sugar and while you  were in the hospital you did not need more than 35 units of long-acting insulin a day. Please start taking 35 units of Levemir daily at bedtime and continue to monitor your blood sugar three times a day.   You will need to follow up with your primary care doctor to see how you are doing with all of these medications within 1-2 weeks of leaving the hospital. The neurology team that took care of you in the hospital would also like to see you as an outpatient. Please see them within 6 weeks of discharge so they can go over your hospitalization and physical progress after your most recent stroke.   Increase activity slowly   Complete by:  As directed       Signed: Thomasene Ripple, MD 06/07/2017, 11:41 AM   Pager: [phone]

## 2017-06-05 NOTE — Plan of Care (Signed)
  Problem: Education: Goal: Knowledge of disease or condition will improve Outcome: Progressing   

## 2017-06-05 NOTE — Evaluation (Signed)
Occupational Therapy Evaluation Patient Details Name: Carl Stephenson MRN: [id number] DOB: Sep 09, 1945 Today's Date: 06/05/2017    History of Present Illness Pt is a 72 y.o. male admitted 06/03/17 with new onset weakness with difficulty walking. MRI revealed R frontal and L internal capsule infarcts; acute/subacute R punctate frontal infarct; numerous old small supra and infratentorial infarcts. PMH includes multiple prior CVAs, HTN, CAD, DMII, chronic LBP.   Clinical Impression   PTA, pt was able to ambulate with rollator or RW without assistance but required assistance from aide for ADL participation. Pt currently requiring significant 2+ assistance for bed mobility with mod assist +2 to sit at EOB and total assist +2 to return to supine. He was able to stand at EOB with min assist +2 and required total assist for pericare and LB dressing tasks. Pt presenting with R gaze but is able to track past midline to the L. He demonstrates L UE swelling, R UE decreased coordination and strength, and poor activity tolerance for ADL at this time. He demonstrates significant decline from PLOF and would benefit from continued OT services wheil admitted as well as OT follow-up at SNF level in order to maximize functional recovery and return to PLOF. OT will continue to follow while admitted.     Follow Up Recommendations  SNF;Supervision/Assistance - 24 hour    Equipment Recommendations  Other (comment)(TBD at next venue of care)    Recommendations for Other Services       Precautions / Restrictions Precautions Precautions: Fall Restrictions Weight Bearing Restrictions: No      Mobility Bed Mobility Overal bed mobility: Needs Assistance Bed Mobility: Supine to Sit;Sit to Supine     Supine to sit: Max assist;+2 for physical assistance Sit to supine: Total assist;+2 for physical assistance   General bed mobility comments: increased time and effort, pt able to initiate bilateral LE movement,  attempted to use bed rail with bilateral UEs, use of bed pads to position pt's hips at EOB  Transfers Overall transfer level: Needs assistance Equipment used: Rolling walker (2 wheeled) Transfers: Sit to/from Stand Sit to Stand: From elevated surface;Min assist;+2 physical assistance;+2 safety/equipment         General transfer comment: increased time and effort, assist to power into standing    Balance Overall balance assessment: Needs assistance Sitting-balance support: Feet supported;Bilateral upper extremity supported;Single extremity supported Sitting balance-[name]: Poor Sitting balance - Comments: forward and R lateral leans   Standing balance support: During functional activity;Bilateral upper extremity supported Standing balance-[name]: Poor Standing balance comment: relies on external assistance                           ADL either performed or assessed with clinical judgement   ADL Overall ADL's : Needs assistance/impaired Eating/Feeding: Moderate assistance;Sitting   Grooming: Sitting;Moderate assistance   Upper Body Bathing: Maximal assistance;Sitting   Lower Body Bathing: Total assistance;Sit to/from stand   Upper Body Dressing : Maximal assistance;Sitting   Lower Body Dressing: Total assistance;Sit to/from Health and safety inspector Details (indicate cue type and reason): Able to complete sit<>stand at EOB with min assist +2 and attempt side steps but unable to off-weight R LE to move foot sideways.  Toileting- Clothing Manipulation and Hygiene: Total assistance;Bed level Toileting - Clothing Manipulation Details (indicate cue type and reason): Pt noted with slight soiled area on arrival.        General ADL Comments: Pt lethargic at  onset and end of session. He was able to tolerate sitting at EOB for approximately 5 minutes for ADL and sensation/coordination testing.      Vision   Vision Assessment?: Yes Eye Alignment: Within  Functional Limits Ocular Range of Motion: Within Functional Limits Alignment/Gaze Preference: Gaze right Tracking/Visual Pursuits: Able to track stimulus in all quads without difficulty Additional Comments: Pt reports no blurry or double vision during assessment. He does demonstrate R gaze but is able to cross midline to the L when tracking.     Perception     Praxis      Pertinent Vitals/Pain Pain Assessment: Faces Faces Pain Scale: Hurts a little bit Pain Location: L hip Pain Descriptors / Indicators: Sore Pain Intervention(s): Monitored during session;Repositioned     Hand Dominance Right   Extremity/Trunk Assessment Upper Extremity Assessment Upper Extremity Assessment: RUE deficits/detail;LUE deficits/detail RUE Deficits / Details: Strength grossly 3+/5. Moving slowly and incoordinated with finger to nose testing. With opposition, attempted to touch each finger to nose rather than each to thumb.  LUE Deficits / Details: Weakness at baseline and 2/5 strength noted grossly. Noted swelling in hand and above IV site.    Lower Extremity Assessment Lower Extremity Assessment: Defer to PT evaluation       Communication Communication Communication: No difficulties   Cognition Arousal/Alertness: Lethargic Behavior During Therapy: WFL for tasks assessed/performed Overall Cognitive Status: Impaired/Different from baseline Area of Impairment: Following commands;Safety/judgement;Problem solving;Attention                   Current Attention Level: Sustained   Following Commands: Follows one step commands consistently Safety/Judgement: Decreased awareness of safety;Decreased awareness of deficits   Problem Solving: Slow processing;Requires verbal cues;Requires tactile cues General Comments: Pt falling asleep at times but more alert when seated at EOB. Slow to process information.    General Comments  Pt requiring increased assistance to sustain balance at EOB. Wife and  daughter present during session.     Exercises     Shoulder Instructions      Home Living Family/patient expects to be discharged to:: Skilled nursing facility Living Arrangements: Spouse/significant other                                      Prior Functioning/Environment Level of Independence: Needs assistance  Gait / Transfers Assistance Needed: ambulates with RW/rollator ADL's / Homemaking Assistance Needed: Has assistance from aide for ADL. Had been assisted wtih showering until lately when requiring wash-ups.             OT Problem List: Decreased strength;Decreased range of motion;Decreased activity tolerance;Impaired balance (sitting and/or standing);Decreased safety awareness;Decreased knowledge of use of DME or AE;Decreased knowledge of precautions;Pain      OT Treatment/Interventions: Self-care/ADL training;Therapeutic exercise;Energy conservation;DME and/or AE instruction;Therapeutic activities;Patient/family education;Balance training    OT Goals(Current goals can be found in the care plan section) Acute Rehab OT Goals Patient Stated Goal: return to PLOF OT Goal Formulation: With patient Time For Goal Achievement: 06/19/17 Potential to Achieve Goals: Good ADL Goals Pt Will Perform Eating: (P) with supervision;sitting Pt Will Perform Grooming: (P) with min assist;standing Pt Will Transfer to Toilet: (P) with min assist;stand pivot transfer;bedside commode Pt Will Perform Toileting - Clothing Manipulation and hygiene: (P) with min assist;sit to/from stand Additional ADL Goal #1: (P) Pt will demonstrate selective attention to task in a minimally distracting environment during grooming tasks.  OT Frequency: Min 2X/week   Barriers to D/C:            Co-evaluation PT/OT/SLP Co-Evaluation/Treatment: Yes Reason for Co-Treatment: Complexity of the patient's impairments (multi-system involvement);For patient/therapist safety;To address functional/ADL  transfers PT goals addressed during session: Mobility/safety with mobility;Balance;Proper use of DME;Strengthening/ROM OT goals addressed during session: ADL's and self-care;Strengthening/ROM      AM-PAC PT "6 Clicks" Daily Activity     Outcome Measure Help from another person eating meals?: A Lot Help from another person taking care of personal grooming?: A Lot Help from another person toileting, which includes using toliet, bedpan, or urinal?: Total Help from another person bathing (including washing, rinsing, drying)?: A Lot Help from another person to put on and taking off regular upper body clothing?: A Lot Help from another person to put on and taking off regular lower body clothing?: Total 6 Click Score: 10   End of Session Nurse Communication: Mobility status  Activity Tolerance: Patient tolerated treatment well Patient left: in bed;with call bell/phone within reach;with bed alarm set;with family/visitor present  OT Visit Diagnosis: Other abnormalities of gait and mobility (R26.89);Ataxia, unspecified (R27.0);Other symptoms and signs involving cognitive function;Hemiplegia and hemiparesis Hemiplegia - Right/Left: Left Hemiplegia - dominant/non-dominant: Non-Dominant 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

## 2017-06-06 LAB — GLUCOSE, CAPILLARY
Glucose-Capillary: 117 mg/dL — ABNORMAL HIGH (ref 65–99)
Glucose-Capillary: 133 mg/dL — ABNORMAL HIGH (ref 65–99)
Glucose-Capillary: 130 mg/dL — ABNORMAL HIGH (ref 65–99)
Glucose-Capillary: 103 mg/dL — ABNORMAL HIGH (ref 65–99)

## 2017-06-06 MED ORDER — LISINOPRIL 10 MG PO TABS
10.0000 mg | ORAL_TABLET | Freq: Every day | ORAL | Status: DC
  Administered 2017-06-06 – 2017-06-07 (×2): 10 mg via ORAL
  Filled 2017-06-06 (×2): qty 1

## 2017-06-06 MED ORDER — ORAL CARE MOUTH RINSE
15.0000 mL | Freq: Two times a day (BID) | OROMUCOSAL | Status: DC
  Administered 2017-06-06 – 2017-06-07 (×3): 15 mL via OROMUCOSAL

## 2017-06-06 NOTE — Plan of Care (Signed)
  Problem: Education: Goal: Knowledge of General Education information will improve Outcome: Progressing   Problem: Health Behavior/Discharge Planning: Goal: Ability to manage health-related needs will improve Outcome: Progressing   Problem: Clinical Measurements: Goal: Ability to maintain clinical measurements within normal limits will improve Outcome: Progressing Goal: Will remain free from infection Outcome: Progressing Goal: Diagnostic test results will improve Outcome: Progressing Goal: Respiratory complications will improve Outcome: Progressing Goal: Cardiovascular complication will be avoided Outcome: Progressing   Problem: Activity: Goal: Risk for activity intolerance will decrease Outcome: Progressing   Problem: Nutrition: Goal: Adequate nutrition will be maintained Outcome: Progressing   Problem: Coping: Goal: Level of anxiety will decrease Outcome: Progressing   Problem: Elimination: Goal: Will not experience complications related to bowel motility Outcome: Progressing Goal: Will not experience complications related to urinary retention Outcome: Progressing   Problem: Pain Managment: Goal: General experience of comfort will improve Outcome: Progressing   Problem: Safety: Goal: Ability to remain free from injury will improve Outcome: Progressing   Problem: Skin Integrity: Goal: Risk for impaired skin integrity will decrease Outcome: Progressing   Problem: Education: Goal: Knowledge of disease or condition will improve Outcome: Progressing Goal: Knowledge of secondary prevention will improve Outcome: Progressing Goal: Knowledge of patient specific risk factors addressed and post discharge goals established will improve Outcome: Progressing   

## 2017-06-06 NOTE — Progress Notes (Signed)
Subjective: Patient was feeling tired when seen this morning, according to him he wants to get some more sleep.  He was able to answer appropriately and denies any new weakness.  When talked with him regarding his SNF placement as advised by PT/OT, he would prefer going home with home health.  Talked with wife on phone as patient will be requiring 24-hour assistance and she said that she can not provide that much assistance and would like him to go to a rehab facility.  According to patient patient was at  [facility] year and that will be their first choice.  If there is no availability she will like to try another facility in [facility] close to her home.  Objective:  Vital signs in last 24 hours: Vitals:   06/05/17 2000 06/06/17 0005 06/06/17 0422 06/06/17 0500  BP: (!) 155/72 (!) 159/81 (!) 155/82   Pulse: 66 66 64   Resp: 20 18 18    Temp: 98 F (36.7 C) 98.5 F (36.9 C) 98.1 F (36.7 C)   TempSrc: Oral Oral Oral   SpO2: 95% 97% 95%   Weight:    182 lb 15.7 oz (83 kg)  Height:       General.  Well-developed, well-nourished gentleman, appears little tired, in no acute distress. Lungs.  Clear bilaterally. CV.  Regular rate and rhythm. Abdomen.  Soft, nontender, bowel sounds positive. Neuro.  Alert and oriented, mild left facial asymmetry with mildly decreased strength on left upper and lower extremity. Extremities.  No edema, no cyanosis, pulses intact and symmetrical.  Assessment/Plan:  CVA.  Patient with history of multiple cryptogenic infarct with new infarct in right frontal and left internal capsule.  CTA with high-grade stenosis of right M1 and proximal PCA.  Patient was on dual antiplatelet and continue to get new strokes.  No definitive evidence of atrial fibrillation. Speech evaluation with oropharyngeal dysphagia-recommending dysphagia 3 diet. -He was started on Eliquis by neurology. -We will discontinue aspirin. -Continue Plavix. -Echo  shows ejection fraction of 40 to 45% with inferior hypokinesia. PT OT recommending SNF placement, patient wants to go home with home health but wife is unable to take care of him at this time and would like him to go to a rehab facility.  He was at [facility] last year in August and that will be their first choice.  Called the social worker and left a message. -Unable to tolerate statin in the past with lipid panel shows total cholesterol of 218, HDL of 38 and LDL of 143. -Can be discharged once a bed become available at a rehab facility.  CAD s/p PCI.  No evidence of chest pain or elevated troponin. Patient was on DAPT. - aspirin was discontinued yesterday as he was started on Eliquis to decrease the risk of bleeding.  HTN.  Blood pressure mildly elevated. -No antihypertensives on home meds. -Start him on lisinopril 10 mg daily-no allergies noted and he will get benefit because of his combined heart failure.  Combined CHF.  Previous ejection fraction of 45%, repeat echo with ejection fraction of 40 to 45% with inferior wall hypokinesia. He was not on any beta-blocker most likely due to his resting bradycardia. No ACE/ARB either-not sure about the reason.  Renal function within normal limit. Appears euvolemic on exam. -Continue home Lasix 20 mg daily.  Hyperlipidemia Has been intolerant of statins in the past and being considered for PCSK9 therapy. Prior notes also suggest starting  Zetia. - Zetia started yesterday.  Diabetes.  A1c of 5.8, improved from previous check of 7.7, with CBG remained between 117-146 over the last 24-hour. -Continue Lantus 35 unit at bedtime. -Continue SSI with at bedtime coverage.  History of Seizure - Continue Keppra  History of Depression - Continue Zoloft  Dispo: Anticipated discharge in approximately 1-2 day(s).  Depending on bed availability at a rehab facility in [place].  Lorella Nimrod, MD 06/06/2017, 10:46 AM Pager:  [phone]

## 2017-06-07 ENCOUNTER — Other Ambulatory Visit: Payer: Self-pay | Admitting: Internal Medicine

## 2017-06-07 DIAGNOSIS — Z794 Long term (current) use of insulin: Secondary | ICD-10-CM | POA: Diagnosis not present

## 2017-06-07 DIAGNOSIS — R1310 Dysphagia, unspecified: Secondary | ICD-10-CM | POA: Diagnosis not present

## 2017-06-07 DIAGNOSIS — I2510 Atherosclerotic heart disease of native coronary artery without angina pectoris: Secondary | ICD-10-CM | POA: Diagnosis not present

## 2017-06-07 DIAGNOSIS — Z888 Allergy status to other drugs, medicaments and biological substances status: Secondary | ICD-10-CM

## 2017-06-07 DIAGNOSIS — E785 Hyperlipidemia, unspecified: Secondary | ICD-10-CM | POA: Diagnosis not present

## 2017-06-07 DIAGNOSIS — E119 Type 2 diabetes mellitus without complications: Secondary | ICD-10-CM | POA: Diagnosis not present

## 2017-06-07 DIAGNOSIS — G40909 Epilepsy, unspecified, not intractable, without status epilepticus: Secondary | ICD-10-CM | POA: Diagnosis not present

## 2017-06-07 DIAGNOSIS — F329 Major depressive disorder, single episode, unspecified: Secondary | ICD-10-CM | POA: Diagnosis not present

## 2017-06-07 DIAGNOSIS — Z79899 Other long term (current) drug therapy: Secondary | ICD-10-CM | POA: Diagnosis not present

## 2017-06-07 DIAGNOSIS — I639 Cerebral infarction, unspecified: Secondary | ICD-10-CM | POA: Diagnosis not present

## 2017-06-07 DIAGNOSIS — R1312 Dysphagia, oropharyngeal phase: Secondary | ICD-10-CM

## 2017-06-07 DIAGNOSIS — I110 Hypertensive heart disease with heart failure: Secondary | ICD-10-CM | POA: Diagnosis not present

## 2017-06-07 DIAGNOSIS — I10 Essential (primary) hypertension: Secondary | ICD-10-CM | POA: Diagnosis not present

## 2017-06-07 DIAGNOSIS — E1152 Type 2 diabetes mellitus with diabetic peripheral angiopathy with gangrene: Secondary | ICD-10-CM | POA: Diagnosis not present

## 2017-06-07 DIAGNOSIS — I6340 Cerebral infarction due to embolism of unspecified cerebral artery: Secondary | ICD-10-CM | POA: Diagnosis not present

## 2017-06-07 DIAGNOSIS — R569 Unspecified convulsions: Secondary | ICD-10-CM | POA: Diagnosis not present

## 2017-06-07 DIAGNOSIS — I5040 Unspecified combined systolic (congestive) and diastolic (congestive) heart failure: Secondary | ICD-10-CM | POA: Diagnosis not present

## 2017-06-07 LAB — GLUCOSE, CAPILLARY: Glucose-Capillary: 85 mg/dL (ref 65–99)

## 2017-06-07 MED ORDER — LISINOPRIL 10 MG PO TABS: 10.0000 mg | ORAL_TABLET | Freq: Every day | ORAL | 0 refills | Status: AC

## 2017-06-07 MED ORDER — INSULIN DETEMIR 100 UNIT/ML ~~LOC~~ SOLN: 35.0000 [IU] | Freq: Every day | SUBCUTANEOUS | 11 refills | Status: AC

## 2017-06-07 MED ORDER — EZETIMIBE 10 MG PO TABS: 10.0000 mg | ORAL_TABLET | Freq: Every day | ORAL | 0 refills | Status: AC

## 2017-06-07 MED ORDER — APIXABAN 5 MG PO TABS: 5.0000 mg | ORAL_TABLET | Freq: Two times a day (BID) | ORAL | 0 refills | Status: AC

## 2017-06-07 NOTE — NC FL2 (Signed)
[place] LEVEL OF CARE SCREENING TOOL     IDENTIFICATION  Patient Name: Carl Stephenson Birthdate: November 22, 1945 Sex: male Admission Date (Current Location): 06/03/2017  [facility] and [place] Number:  Herbalist and Address:  [facility], [place] [address], [place], [place] [address]      Provider Number: [id number]  Attending Physician Name and Address:  Axel Filler, *  Relative Name and Phone Number:       Current Level of Care: Hospital Recommended Level of Care: [place] Prior Approval Number:    Date Approved/Denied:   PASRR Number: [id number]  Discharge Plan: SNF    Current Diagnoses: Patient Active Problem List   Diagnosis Date Noted  . Weakness 09/07/2016  . Gait abnormality 09/07/2016  . Chronic combined systolic and diastolic CHF (congestive heart failure) ([place]) 02/18/2016  . History of embolic stroke 45/80/9983  . Bradycardia 01/16/2016  . Abnormal EKG   . LBBB (left bundle branch block)   . Coronary artery disease involving native coronary artery of native heart without angina pectoris   . ECG abnormality   . History of CVA (cerebrovascular accident)   . Lactic acidosis   . Convulsions ([place])   . Hypotension 01/16/2014  . CVA (cerebral vascular accident) ([place]) 12/29/2013  . Acute encephalopathy 12/28/2013  . Seborrheic dermatitis 10/04/2012  . Microcytosis 11/07/2011  . Aphasia 06/26/2011  . 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 Cerebral thrombosis with cerebral infarction ([place]) 08/14/2010  . Temporary cerebral vascular dysfunction 10/24/2009  . BENIGN NEOPLASM OF BLADDER 03/16/2008  . Hyperlipidemia 03/16/2008  . Obesity 03/16/2008  . Essential hypertension 03/16/2008  . MYOCARDIAL INFARCTION 03/16/2008  . UNSPECIFIED PAROXYSMAL TACHYCARDIA 03/16/2008  . GERD 03/16/2008  . Diabetes mellitus ([place]) 03/16/2008    Orientation RESPIRATION BLADDER Height & Weight      Self, Situation  Normal External catheter, Incontinent(Placed 5/26) Weight: 182 lb 8.7 oz (82.8 kg) Height:  5\' 6"  (167.6 cm)  BEHAVIORAL SYMPTOMS/MOOD NEUROLOGICAL BOWEL NUTRITION STATUS      Continent Diet(DYS 2 thin liquids)  AMBULATORY STATUS COMMUNICATION OF NEEDS Skin   Extensive Assist Verbally Normal                       Personal Care Assistance Level of Assistance  Bathing, Feeding, Dressing Bathing Assistance: Maximum assistance Feeding assistance: Limited assistance Dressing Assistance: Maximum assistance     Functional Limitations Info  Sight, Hearing, Speech Sight Info: Impaired Hearing Info: Adequate Speech Info: Adequate(delayed responses)    SPECIAL CARE FACTORS FREQUENCY  PT (By licensed PT), OT (By licensed OT)     PT Frequency: 3x OT Frequency: 3x            Contractures Contractures Info: Not present    Additional Factors Info  Code Status, Allergies Code Status Info: Full Code Allergies Info: Statins, Atorvastatin, Nitrofurantoin Monohyd Macro           Current Medications (06/07/2017):  This is the current hospital active medication list Current Facility-Administered Medications  Medication Dose Route Frequency Provider Last Rate Last Dose  . acetaminophen (TYLENOL) tablet 650 mg  650 mg Oral Q4H PRN Jule Ser, DO       Or  . acetaminophen (TYLENOL) solution 650 mg  650 mg Per Tube Q4H PRN Jule Ser, DO       Or  . acetaminophen (TYLENOL) suppository 650 mg  650 mg Rectal Q4H PRN Jule Ser, DO      .  apixaban (ELIQUIS) tablet 5 mg  5 mg Oral BID Rosalin Hawking, MD   5 mg at 06/07/17 1034  . ezetimibe (ZETIA) tablet 10 mg  10 mg Oral Daily Lorella Nimrod, MD   10 mg at 06/07/17 1033  . furosemide (LASIX) tablet 20 mg  20 mg Oral Daily Lorella Nimrod, MD   20 mg at 06/07/17 1034  . insulin aspart (novoLOG) injection 0-5 Units  0-5 Units Subcutaneous QHS Jule Ser, DO      . insulin aspart (novoLOG) injection 0-9  Units  0-9 Units Subcutaneous TID WC Jule Ser, DO   1 Units at 06/06/17 1707  . insulin detemir (LEVEMIR) injection 35 Units  35 Units Subcutaneous Q supper Jule Ser, DO   35 Units at 06/06/17 1707  . levETIRAcetam (KEPPRA) tablet 500 mg  500 mg Oral BID Jule Ser, DO   500 mg at 06/07/17 1034  . lisinopril (PRINIVIL,ZESTRIL) tablet 10 mg  10 mg Oral Daily Lorella Nimrod, MD   10 mg at 06/07/17 1034  . loratadine (CLARITIN) tablet 10 mg  10 mg Oral Daily Jule Ser, DO   10 mg at 06/07/17 1034  . MEDLINE mouth rinse  15 mL Mouth Rinse BID Axel Filler, MD   15 mL at 06/07/17 1034  . pantoprazole (PROTONIX) EC tablet 40 mg  40 mg Oral Daily Jule Ser, DO   40 mg at 06/07/17 1034  . sertraline (ZOLOFT) tablet 100 mg  100 mg Oral Daily Jule Ser, DO   100 mg at 06/07/17 1034   Facility-Administered Medications Ordered in Other Encounters  Medication Dose Route Frequency Provider Last Rate Last Dose  . heparin infusion 2 units/mL in 0.9 % sodium chloride    Continuous PRN Martinique, Peter M, MD   1,000 mL at 01/15/16 0514  . iopamidol (ISOVUE-370) 76 % injection    PRN Martinique, Peter M, MD   105 mL at 01/15/16 0514  . lidocaine (PF) (XYLOCAINE) 1 % injection    PRN Martinique, Peter M, MD   2 mL at 01/15/16 0514  . Radial Cocktail/Verapamil only    PRN Martinique, Peter M, MD   10 mL at 01/15/16 0451     Discharge Medications: Please see discharge summary for a list of discharge medications.  Relevant Imaging Results:  Relevant Lab Results:   Additional Information SSN: [id number]  Eileen Stanford, LCSW

## 2017-06-07 NOTE — Clinical Social Work Placement (Signed)
   CLINICAL SOCIAL WORK PLACEMENT  NOTE  Date:  06/07/2017  Patient Details  Name: Carl Stephenson MRN: [id number] Date of Birth: October 26, 1945  Clinical Social Work is seeking post-discharge placement for this patient at the [place] level of care (*CSW will initial, date and re-position this form in  chart as items are completed):      Patient/family provided with [facility] Work Department's list of facilities offering this level of care within the geographic area requested by the patient (or if unable, by the patient's family).  Yes   Patient/family informed of their freedom to choose among providers that offer the needed level of care, that participate in Medicare, Medicaid or managed care program needed by the patient, have an available bed and are willing to accept the patient.      Patient/family informed of [facility]'s ownership interest in [facility] and [facility], as well as of the fact that they are under no obligation to receive care at these facilities.  PASRR submitted to EDS on       PASRR number received on 06/07/17     Existing PASRR number confirmed on 06/07/17     FL2 transmitted to all facilities in geographic area requested by pt/family on 06/07/17     FL2 transmitted to all facilities within larger geographic area on       Patient informed that his/her managed care company has contracts with or will negotiate with certain facilities, including the following:        Yes   Patient/family informed of bed offers received.  Patient chooses bed at [place], [facility]     Physician recommends and patient chooses bed at      Patient to be transferred to [facility] on 06/07/17.  Patient to be transferred to facility by [facility]     Patient family notified on 06/07/17 of transfer.  Name of family member notified:  Abigail Butts, daughter     PHYSICIAN Please prepare priority discharge summary, including medications, Please  prepare prescriptions, Please sign FL2     Additional Comment:    _______________________________________________ Eileen Stanford, LCSW 06/07/2017, 10:45 AM

## 2017-06-07 NOTE — Progress Notes (Signed)
Report called to [facility] facility in [place].  All patients belongings gathered and sent with family.  Patient sleepy, but easily arouses and responds appropiately.  Discussed sleepiness with Dr. Berneice Gandy who agrees patient is up during the night and is much more alert in the mornings and evenings, family reassured.  Discussed patient continuing both Plavix and Eliquis upon discharge as well.  No further questions or concerns from receiving RN or family at this time.

## 2017-06-07 NOTE — Progress Notes (Signed)
Internal Medicine Attending:   I saw and examined the patient. I reviewed the resident's note and I agree with the resident's findings and plan as documented in the resident's note.  Hospital day #3 with acute CVA that is presumed embolic. Doing well today, stable neurologic status though seems to have more depressed mood. We talked about SNF for rehab needs and hope that anticoagulation will be a better secondary prevention strategy.

## 2017-06-07 NOTE — Progress Notes (Signed)
Patient is awake at this time. During bedside shift report,wife voice concern that he appears more lethargic during the day RN offered comforts measure such as turning off TV, repositioning, and soft music to aid sleep and to be in tune with his day and night. pt declined and prefer to watch TV at this time. Will continue to monitor.   Ave Filter, RN

## 2017-06-07 NOTE — Clinical Social Work Note (Signed)
Clinical Social Worker facilitated patient discharge including contacting patient family and facility to confirm patient discharge plans.  Clinical information faxed to facility and family agreeable with plan.  CSW arranged ambulance transport via [facility] to [facility] in [place].  RN to call [phone] for report prior to discharge.  Clinical Social Worker will sign off for now as social work intervention is no longer needed. Please consult [place] again if new need arises.  [place], [place]

## 2017-06-07 NOTE — Progress Notes (Signed)
  Speech Language Pathology Treatment: Dysphagia;Cognitive-Linquistic  Patient Details Name: Carl Stephenson MRN: [id number] DOB: 1945-11-02 Today's Date: 06/07/2017 Time: 0102-7253 SLP Time Calculation (min) (ACUTE ONLY): 26 min  Assessment / Plan / Recommendation Clinical Impression  SLP provided skilled treatment targeting dysphagia and cognitive-linguistics. Pt in bed with breakfast tray present, no family or caregivers present. SLP repositioned more upright and facilitated sustained attention, following 1 step commands. Visual, verbal cues required to adjust/retrieve items from tray. Min-mod A and extended time to pour syrup on pancakes; pt had difficulty retrieving pancake with fork, so SLP modified texture by cutting into small pieces. SLP facilitated expression of wants/needs; pt does not choose desired item from tray when offered from a f:3, however with Y/N questions able to communicate choice. Verbal, tactile cues required to refrain from taking multiple bites at one time. Mastication/bolus formation prolonged, and cough x2 noted with liquid wash of solid (not volitional). With smaller bites and cues for thorough oral clearance with moistened soft solid and purees, no overt signs of aspiration even with liquid wash. Pt requires consistent cuing to use compensatory cough after liquids. SLP assisted pt with oral care after he finished his meal. Per chart review lung sounds have been clear and pt afebrile. Will continue on thin liquids, but downgrade to dys 2 solids. Oral care prior to and after meals; continue full supervision due to max cues required for swallow precautions, and assist pt with self-feeding as needed.     HPI HPI: Carl Stephenson is a 72 y.o. male with a history of a total of 4 episodes of stroke all with multiple strokes on MRI.  His first stroke happened in October 2012 and involved the posterior corpus callosum on the left and felt to be a white matter lacune.  He then had  a cardiac catheterization in 2015 with discoordination following the procedure.  He had embolic appearing strokes on MRI following this.  In July 2018 he had a pontine infarct as well as a Juxtacortical white matter infarct on the left.  Today he presents with sudden onset difficulty walking.  He states that he felt relatively normal before he sat down on the commode this afternoon.  Following sitting on the commode he tried to stand up but was unable to.  MRI is showing right frontal and left internal capsule infarct as well as numerous old infarcts.  Chest xray is showing mild pulmonary edema.      SLP Plan  Continue with current plan of care(downgrade to dsy 2, thin liquids)       Recommendations  Diet recommendations: Dysphagia 2 (fine chop);Thin liquid Liquids provided via: Cup;No straw Medication Administration: Whole meds with puree Supervision: Full supervision/cueing for compensatory strategies;Patient able to self feed(needs occasional assistance) Compensations: Slow rate;Small sips/bites;Follow solids with liquid(cough reswallow with liquids)                Oral Care Recommendations: Oral care before and after PO Follow up Recommendations: Other (comment)(TBD) SLP Visit Diagnosis: Dysphagia, oropharyngeal phase (R13.12);Cognitive communication deficit (R41.841) Plan: Continue with current plan of care(downgrade to dsy 2, thin liquids)       GO               Carl Stephenson, [place], CCC-SLP Speech-Language Pathologist [phone]  Carl Stephenson 06/07/2017, 9:31 AM

## 2017-06-07 NOTE — Progress Notes (Signed)
Subjective:  Patient seen sitting comfortably in bed this AM in no acute distress. Patient states that he feels well overall and has no acute complaints.   Objective:  Vital signs in last 24 hours: Vitals:   06/06/17 2322 06/07/17 0319 06/07/17 0411 06/07/17 0816  BP: (!) 155/68 (!) 146/67  138/68  Pulse: 65 67  66  Resp: 20 18  17   Temp: 98.5 F (36.9 C) 98.9 F (37.2 C)  98.4 F (36.9 C)  TempSrc: Oral Oral  Oral  SpO2: 96% 92%  97%  Weight: 184 lb 4.9 oz (83.6 kg)  182 lb 8.7 oz (82.8 kg)   Height:       Physical Exam  Constitutional:  Appears older than stated age sitting comfortably in bed, non-diaphoretic and in no acute distress.  HENT:  Mouth/Throat: Oropharynx is clear and moist.  Eyes: Conjunctivae are normal. No scleral icterus.  Cardiovascular: Normal rate, regular rhythm and intact distal pulses.  Respiratory: Effort normal.  Lungs clear to auscultation in bilateral lung fields  GI: Soft. He exhibits no distension. There is no tenderness.  Musculoskeletal: He exhibits no edema (of bilateral lower extremities) or tenderness (of bilateral lower extremities).  Neurological:  Face strength and sensation intact bilaterally. Tongue midline. 4-/5 bicep, tricep, and grip strength bilaterally. Gross sensation to light touch of upper and lower extremities intact bilaterally.  Skin: Skin is warm and dry. No rash noted. No erythema.  Psychiatric:  Flat affect today. Judgement and thought content normal.   Assessment/Plan:  Principal Problem:   CVA (cerebral vascular accident) ([place]) Active Problems:   Hyperlipidemia   Diabetes mellitus ([place])   Convulsions ([place])   Chronic combined systolic and diastolic CHF (congestive heart failure) ([place])  Recurrent TWS:FKCLEXN with history of multiple cryptogenic infarcts and residual generalized weakness who presented with new infarcts in right frontal lobe and left internal capsule. Physical exam without new deficits but rather  global weakness that appears to be chronic per family discussion. PT/OT recommend acute rehabilitation, patient and family amenable to this plan.  -Neurology consulted on admission, signed off -Continue risk factor modification as outlined below -Continue Plavix 75 mg daily and Eliquis 5 mg BID -PT/OT recommend SNF placement, SW consulted for assistance  CAD s/p PCI: Patient previously on aspirin 81 mg and plavix 75 mg given this history. Discontinued aspirin this admission, since Eliquis was started to help with further risk reduction. Will continue Plavix and Eliquis on discharge.   Combined CHF (LVEF 40-45% on 2019 echo), TZG:[id number] normotensive and patient without signs/symptoms of volume overload on exam. He is not on BB as outpatient 2/2 bradycardia, but was started on low-dose lisinopril for hypertension given this diagnosis. -Continue lisinopril 10 mg daily -Continue home Lasix 20 mg daily  Hyperlipidemia: Patient with history of statin intolerance. LDL = 143, Total cholesterol = 218, HDL = 28. Patient needs diet and lifestyle modifications in addition to medications for risk factor optimization to prevent future strokes. He was started on zetia 10 mg this admission with plans to continue on discharge. -Continue Zetia 10 mg daily  Diabetes: Hemoglobin A1c = 5.8% on admission, indicating good control at baseline on home levimer 70 units daily. Patient likely can decrease his daily insulin requirements given A1C <6.0% as outpatient. Recent CBG within hospital goal of <180 on current regimen. -Continue Lantus 35 units qHS -Continue SSI TID with meals, qHS coverage  Hx of Seizure: Chronic, stable. Will continue Keppra 500 mg BID.   FEN/GI: -Dysphagia  2 diet -No IVF, replace electrolytes as needed -Protonix  VTE Prophylaxis: Eliquis 5 mg BID Code Status: Full  Dispo: Anticipated discharge pending SNF placment.   Thomasene Ripple, MD 06/07/2017, 10:32 AM Pager:  [phone]

## 2017-06-07 NOTE — Care Management Important Message (Signed)
Important Message  Patient Details  Name: Carl Stephenson MRN: [id number] Date of Birth: Apr 14, 1945   Medicare Important Message Given:  Yes    Barb Merino Rosali Augello 06/07/2017, 12:44 PM

## 2017-06-07 NOTE — Plan of Care (Signed)
Patient advanced to dysphagia 1 diet with thin liquids.  Encouraging patient to take small sips and sip straight up.  Patient to be d/c to [facility] facility today, transportation to be set up .

## 2017-06-07 NOTE — Clinical Social Work Note (Signed)
sClinical Social Work Assessment  Patient Details  Name: Carl Stephenson MRN: [id number] Date of Birth: [birth year]  Date of referral:  06/07/17               Reason for consult:  Facility Placement                Permission sought to share information with:  Family Supports Permission granted to share information::  Yes, Release of Information Signed  Name::     Abigail Butts  Agency::  [facility]  Relationship::  daughter  Contact Information:  [phone]  Housing/Transportation Living arrangements for the past 2 months:  Single Family Home Source of Information:  Adult Children Patient Interpreter Needed:  None Criminal Activity/Legal Involvement Pertinent to Current Situation/Hospitalization:  No - Comment as needed Significant Relationships:  Adult Children, Spouse Lives with:  Spouse Do you feel safe going back to the place where you live?  No Need for family participation in patient care:  No (Coment)  Care giving concerns:  Pt is alert to self and situation. No family present at bedside at this time. CSW attempted to reach pt's spouse via telephone however unsuccessful at reaching her. CSW then spoke with pt's daughter via telephone. Pt lives at home with spouse. No other care givers noted at this time. Pt' daughter denies any concerns at this time.   Social Worker assessment / plan:  CSW spoke with pt's daughter via telephone. Pt's daughter and pt's spouse will be coming to the hospital soon. Pt's daughter request that pt go to [facility]  as pt has been there before and they were pleased with facility and care. CSW explained that pt can d/c today. Pt's daughter understanding. CSW to follow up with facility as well as daughter regarding bed availability at [facility] in [place] today.   Employment status:  Retired Forensic scientist:  Medicare PT Recommendations:  [place] / Referral to community resources:  [place]  Patient/Family's Response to care:  Pt's daughter verbalized understanding of CSW role and expressed appreciation for support. Pt's daughter denies any concern regarding pt care at this time.   Patient/Family's Understanding of and Emotional Response to Diagnosis, Current Treatment, and Prognosis:  Pt's daughter understanding and realistic regarding pt's physical limitations. Pt's daughter understands the need for pt to go to SNF at d/c--pt's daughter agreeable. Pt's daughter denies any concern regarding pt's treatment plan at this time. CSW will continue to provide support and facilitate d/c needs.   Emotional Assessment Appearance:  Appears stated age Attitude/Demeanor/Rapport:  Unable to Assess Affect (typically observed):  Unable to Assess Orientation:  Oriented to Self, Oriented to Situation Alcohol / Substance use:  Not Applicable Psych involvement (Current and /or in the community):  No (Comment)  Discharge Needs  Concerns to be addressed:  Basic Needs, Care Coordination Readmission within the last 30 days:  No Current discharge risk:  Dependent with Mobility Barriers to Discharge:  Continued Medical Work up   [employer]. [employer], LCSW 06/07/2017, 10:29 AM

## 2017-06-09 DIAGNOSIS — E785 Hyperlipidemia, unspecified: Secondary | ICD-10-CM | POA: Diagnosis not present

## 2017-06-09 DIAGNOSIS — E1152 Type 2 diabetes mellitus with diabetic peripheral angiopathy with gangrene: Secondary | ICD-10-CM | POA: Diagnosis not present

## 2017-06-09 DIAGNOSIS — R1310 Dysphagia, unspecified: Secondary | ICD-10-CM | POA: Diagnosis not present

## 2017-06-09 DIAGNOSIS — I639 Cerebral infarction, unspecified: Secondary | ICD-10-CM | POA: Diagnosis not present

## 2017-06-09 LAB — CULTURE, BLOOD (ROUTINE X 2)
Culture: NO GROWTH
Culture: NO GROWTH
SPECIAL REQUESTS: ADEQUATE
Special Requests: ADEQUATE

## 2017-06-16 ENCOUNTER — Telehealth: Payer: Self-pay | Admitting: Cardiology

## 2017-06-16 LAB — CUP PACEART REMOTE DEVICE CHECK
Implantable Pulse Generator Implant Date: 20180108
MDC IDC SESS DTM: 20190513021038

## 2017-06-16 NOTE — Telephone Encounter (Signed)
Spoke w/ pt wife and requested that he send a manual transmission b/c his home monitor has not updated in at least 14 days. Pt wife is aware that she can take the monitor to the facility where patient is at for Rehab or she can wait to reconnect the monitor when the patient gets home. Pt wife is going to wait for pt to get back home to reconnect the monitor.

## 2017-06-21 ENCOUNTER — Ambulatory Visit: Payer: Medicare Other | Admitting: Cardiovascular Disease

## 2017-06-22 ENCOUNTER — Encounter: Payer: Self-pay | Admitting: Cardiology

## 2017-06-23 ENCOUNTER — Ambulatory Visit: Payer: Medicare Other | Admitting: Nurse Practitioner

## 2017-06-25 ENCOUNTER — Encounter: Payer: Medicare Other | Admitting: *Deleted

## 2017-07-09 ENCOUNTER — Ambulatory Visit: Payer: Medicare Other | Admitting: Sports Medicine

## 2017-07-09 DIAGNOSIS — I69391 Dysphagia following cerebral infarction: Secondary | ICD-10-CM | POA: Diagnosis not present

## 2017-07-09 DIAGNOSIS — I110 Hypertensive heart disease with heart failure: Secondary | ICD-10-CM | POA: Diagnosis not present

## 2017-07-09 DIAGNOSIS — R1310 Dysphagia, unspecified: Secondary | ICD-10-CM | POA: Diagnosis not present

## 2017-07-09 DIAGNOSIS — I69354 Hemiplegia and hemiparesis following cerebral infarction affecting left non-dominant side: Secondary | ICD-10-CM | POA: Diagnosis not present

## 2017-07-09 DIAGNOSIS — E119 Type 2 diabetes mellitus without complications: Secondary | ICD-10-CM | POA: Diagnosis not present

## 2017-07-09 DIAGNOSIS — I5040 Unspecified combined systolic (congestive) and diastolic (congestive) heart failure: Secondary | ICD-10-CM | POA: Diagnosis not present

## 2017-07-12 DIAGNOSIS — I69391 Dysphagia following cerebral infarction: Secondary | ICD-10-CM | POA: Diagnosis not present

## 2017-07-12 DIAGNOSIS — R1310 Dysphagia, unspecified: Secondary | ICD-10-CM | POA: Diagnosis not present

## 2017-07-12 DIAGNOSIS — I110 Hypertensive heart disease with heart failure: Secondary | ICD-10-CM | POA: Diagnosis not present

## 2017-07-12 DIAGNOSIS — I5040 Unspecified combined systolic (congestive) and diastolic (congestive) heart failure: Secondary | ICD-10-CM | POA: Diagnosis not present

## 2017-07-12 DIAGNOSIS — E119 Type 2 diabetes mellitus without complications: Secondary | ICD-10-CM | POA: Diagnosis not present

## 2017-07-12 DIAGNOSIS — I69354 Hemiplegia and hemiparesis following cerebral infarction affecting left non-dominant side: Secondary | ICD-10-CM | POA: Diagnosis not present

## 2017-07-13 DIAGNOSIS — I110 Hypertensive heart disease with heart failure: Secondary | ICD-10-CM | POA: Diagnosis not present

## 2017-07-13 DIAGNOSIS — I69354 Hemiplegia and hemiparesis following cerebral infarction affecting left non-dominant side: Secondary | ICD-10-CM | POA: Diagnosis not present

## 2017-07-13 DIAGNOSIS — R1310 Dysphagia, unspecified: Secondary | ICD-10-CM | POA: Diagnosis not present

## 2017-07-13 DIAGNOSIS — I5040 Unspecified combined systolic (congestive) and diastolic (congestive) heart failure: Secondary | ICD-10-CM | POA: Diagnosis not present

## 2017-07-13 DIAGNOSIS — I69391 Dysphagia following cerebral infarction: Secondary | ICD-10-CM | POA: Diagnosis not present

## 2017-07-13 DIAGNOSIS — E119 Type 2 diabetes mellitus without complications: Secondary | ICD-10-CM | POA: Diagnosis not present

## 2017-07-14 DIAGNOSIS — E1165 Type 2 diabetes mellitus with hyperglycemia: Secondary | ICD-10-CM | POA: Diagnosis not present

## 2017-07-14 DIAGNOSIS — I69321 Dysphasia following cerebral infarction: Secondary | ICD-10-CM | POA: Diagnosis not present

## 2017-07-14 DIAGNOSIS — Z794 Long term (current) use of insulin: Secondary | ICD-10-CM | POA: Diagnosis not present

## 2017-07-14 DIAGNOSIS — I69359 Hemiplegia and hemiparesis following cerebral infarction affecting unspecified side: Secondary | ICD-10-CM | POA: Diagnosis not present

## 2017-07-16 DIAGNOSIS — E119 Type 2 diabetes mellitus without complications: Secondary | ICD-10-CM | POA: Diagnosis not present

## 2017-07-16 DIAGNOSIS — I69354 Hemiplegia and hemiparesis following cerebral infarction affecting left non-dominant side: Secondary | ICD-10-CM | POA: Diagnosis not present

## 2017-07-16 DIAGNOSIS — I110 Hypertensive heart disease with heart failure: Secondary | ICD-10-CM | POA: Diagnosis not present

## 2017-07-16 DIAGNOSIS — I5040 Unspecified combined systolic (congestive) and diastolic (congestive) heart failure: Secondary | ICD-10-CM | POA: Diagnosis not present

## 2017-07-16 DIAGNOSIS — I69391 Dysphagia following cerebral infarction: Secondary | ICD-10-CM | POA: Diagnosis not present

## 2017-07-16 DIAGNOSIS — R1310 Dysphagia, unspecified: Secondary | ICD-10-CM | POA: Diagnosis not present

## 2017-07-17 DIAGNOSIS — I5040 Unspecified combined systolic (congestive) and diastolic (congestive) heart failure: Secondary | ICD-10-CM | POA: Diagnosis not present

## 2017-07-17 DIAGNOSIS — I69354 Hemiplegia and hemiparesis following cerebral infarction affecting left non-dominant side: Secondary | ICD-10-CM | POA: Diagnosis not present

## 2017-07-17 DIAGNOSIS — I69391 Dysphagia following cerebral infarction: Secondary | ICD-10-CM | POA: Diagnosis not present

## 2017-07-17 DIAGNOSIS — I110 Hypertensive heart disease with heart failure: Secondary | ICD-10-CM | POA: Diagnosis not present

## 2017-07-17 DIAGNOSIS — R1310 Dysphagia, unspecified: Secondary | ICD-10-CM | POA: Diagnosis not present

## 2017-07-17 DIAGNOSIS — E119 Type 2 diabetes mellitus without complications: Secondary | ICD-10-CM | POA: Diagnosis not present

## 2017-07-19 DIAGNOSIS — I69354 Hemiplegia and hemiparesis following cerebral infarction affecting left non-dominant side: Secondary | ICD-10-CM | POA: Diagnosis not present

## 2017-07-19 DIAGNOSIS — E119 Type 2 diabetes mellitus without complications: Secondary | ICD-10-CM | POA: Diagnosis not present

## 2017-07-19 DIAGNOSIS — I5040 Unspecified combined systolic (congestive) and diastolic (congestive) heart failure: Secondary | ICD-10-CM | POA: Diagnosis not present

## 2017-07-19 DIAGNOSIS — R1310 Dysphagia, unspecified: Secondary | ICD-10-CM | POA: Diagnosis not present

## 2017-07-19 DIAGNOSIS — I69391 Dysphagia following cerebral infarction: Secondary | ICD-10-CM | POA: Diagnosis not present

## 2017-07-19 DIAGNOSIS — I110 Hypertensive heart disease with heart failure: Secondary | ICD-10-CM | POA: Diagnosis not present

## 2017-07-21 DIAGNOSIS — E119 Type 2 diabetes mellitus without complications: Secondary | ICD-10-CM | POA: Diagnosis not present

## 2017-07-21 DIAGNOSIS — I69391 Dysphagia following cerebral infarction: Secondary | ICD-10-CM | POA: Diagnosis not present

## 2017-07-21 DIAGNOSIS — I5040 Unspecified combined systolic (congestive) and diastolic (congestive) heart failure: Secondary | ICD-10-CM | POA: Diagnosis not present

## 2017-07-21 DIAGNOSIS — R1310 Dysphagia, unspecified: Secondary | ICD-10-CM | POA: Diagnosis not present

## 2017-07-21 DIAGNOSIS — I110 Hypertensive heart disease with heart failure: Secondary | ICD-10-CM | POA: Diagnosis not present

## 2017-07-21 DIAGNOSIS — I69354 Hemiplegia and hemiparesis following cerebral infarction affecting left non-dominant side: Secondary | ICD-10-CM | POA: Diagnosis not present

## 2017-07-22 DIAGNOSIS — I110 Hypertensive heart disease with heart failure: Secondary | ICD-10-CM | POA: Diagnosis not present

## 2017-07-22 DIAGNOSIS — I69354 Hemiplegia and hemiparesis following cerebral infarction affecting left non-dominant side: Secondary | ICD-10-CM | POA: Diagnosis not present

## 2017-07-22 DIAGNOSIS — I69391 Dysphagia following cerebral infarction: Secondary | ICD-10-CM | POA: Diagnosis not present

## 2017-07-22 DIAGNOSIS — E119 Type 2 diabetes mellitus without complications: Secondary | ICD-10-CM | POA: Diagnosis not present

## 2017-07-22 DIAGNOSIS — R1310 Dysphagia, unspecified: Secondary | ICD-10-CM | POA: Diagnosis not present

## 2017-07-22 DIAGNOSIS — I5040 Unspecified combined systolic (congestive) and diastolic (congestive) heart failure: Secondary | ICD-10-CM | POA: Diagnosis not present

## 2017-07-23 ENCOUNTER — Telehealth: Payer: Self-pay | Admitting: *Deleted

## 2017-07-23 DIAGNOSIS — I69391 Dysphagia following cerebral infarction: Secondary | ICD-10-CM | POA: Diagnosis not present

## 2017-07-23 DIAGNOSIS — E119 Type 2 diabetes mellitus without complications: Secondary | ICD-10-CM | POA: Diagnosis not present

## 2017-07-23 DIAGNOSIS — I110 Hypertensive heart disease with heart failure: Secondary | ICD-10-CM | POA: Diagnosis not present

## 2017-07-23 DIAGNOSIS — R1310 Dysphagia, unspecified: Secondary | ICD-10-CM | POA: Diagnosis not present

## 2017-07-23 DIAGNOSIS — I69354 Hemiplegia and hemiparesis following cerebral infarction affecting left non-dominant side: Secondary | ICD-10-CM | POA: Diagnosis not present

## 2017-07-23 DIAGNOSIS — I5040 Unspecified combined systolic (congestive) and diastolic (congestive) heart failure: Secondary | ICD-10-CM | POA: Diagnosis not present

## 2017-07-23 NOTE — Telephone Encounter (Signed)
LMOM requesting manual ILR transmission.  [facility] phone number for questions/concerns.

## 2017-07-23 NOTE — Telephone Encounter (Signed)
Dr. Lovena Le reviewed "AF" episode from 07/18/17, duration 74min.  ECG indeterminate due to artifact, plan to continue watchful waiting per Dr. Lovena Le. No changes at this time.  Will review manual transmission when received to ensure no additional unreviewed episodes.

## 2017-07-26 ENCOUNTER — Encounter (HOSPITAL_COMMUNITY): Payer: Self-pay | Admitting: *Deleted

## 2017-07-26 ENCOUNTER — Emergency Department (HOSPITAL_COMMUNITY): Payer: Medicare Other

## 2017-07-26 ENCOUNTER — Inpatient Hospital Stay (HOSPITAL_COMMUNITY)
Admission: EM | Admit: 2017-07-26 | Discharge: 2017-07-30 | DRG: 065 | Disposition: A | Discharge status: Skilled Nursing Facility | Payer: Medicare Other | Attending: Internal Medicine | Admitting: Internal Medicine

## 2017-07-26 ENCOUNTER — Other Ambulatory Visit: Payer: Self-pay

## 2017-07-26 ENCOUNTER — Observation Stay (HOSPITAL_COMMUNITY): Payer: Medicare Other

## 2017-07-26 DIAGNOSIS — I491 Atrial premature depolarization: Secondary | ICD-10-CM | POA: Diagnosis not present

## 2017-07-26 DIAGNOSIS — I130 Hypertensive heart and chronic kidney disease with heart failure and stage 1 through stage 4 chronic kidney disease, or unspecified chronic kidney disease: Secondary | ICD-10-CM | POA: Diagnosis present

## 2017-07-26 DIAGNOSIS — R001 Bradycardia, unspecified: Secondary | ICD-10-CM | POA: Diagnosis not present

## 2017-07-26 DIAGNOSIS — R531 Weakness: Secondary | ICD-10-CM | POA: Diagnosis not present

## 2017-07-26 DIAGNOSIS — G8194 Hemiplegia, unspecified affecting left nondominant side: Secondary | ICD-10-CM | POA: Diagnosis present

## 2017-07-26 DIAGNOSIS — I6340 Cerebral infarction due to embolism of unspecified cerebral artery: Secondary | ICD-10-CM

## 2017-07-26 DIAGNOSIS — Z87442 Personal history of urinary calculi: Secondary | ICD-10-CM

## 2017-07-26 DIAGNOSIS — N183 Chronic kidney disease, stage 3 unspecified: Secondary | ICD-10-CM | POA: Diagnosis present

## 2017-07-26 DIAGNOSIS — Z87891 Personal history of nicotine dependence: Secondary | ICD-10-CM

## 2017-07-26 DIAGNOSIS — Z8249 Family history of ischemic heart disease and other diseases of the circulatory system: Secondary | ICD-10-CM

## 2017-07-26 DIAGNOSIS — E440 Moderate protein-calorie malnutrition: Secondary | ICD-10-CM | POA: Diagnosis present

## 2017-07-26 DIAGNOSIS — Z888 Allergy status to other drugs, medicaments and biological substances status: Secondary | ICD-10-CM

## 2017-07-26 DIAGNOSIS — E119 Type 2 diabetes mellitus without complications: Secondary | ICD-10-CM

## 2017-07-26 DIAGNOSIS — Z8673 Personal history of transient ischemic attack (TIA), and cerebral infarction without residual deficits: Secondary | ICD-10-CM | POA: Diagnosis not present

## 2017-07-26 DIAGNOSIS — I5042 Chronic combined systolic (congestive) and diastolic (congestive) heart failure: Secondary | ICD-10-CM | POA: Diagnosis present

## 2017-07-26 DIAGNOSIS — E1122 Type 2 diabetes mellitus with diabetic chronic kidney disease: Secondary | ICD-10-CM | POA: Diagnosis present

## 2017-07-26 DIAGNOSIS — Z79899 Other long term (current) drug therapy: Secondary | ICD-10-CM

## 2017-07-26 DIAGNOSIS — I252 Old myocardial infarction: Secondary | ICD-10-CM

## 2017-07-26 DIAGNOSIS — I639 Cerebral infarction, unspecified: Secondary | ICD-10-CM | POA: Diagnosis not present

## 2017-07-26 DIAGNOSIS — R29708 NIHSS score 8: Secondary | ICD-10-CM | POA: Diagnosis present

## 2017-07-26 DIAGNOSIS — R1310 Dysphagia, unspecified: Secondary | ICD-10-CM | POA: Diagnosis present

## 2017-07-26 DIAGNOSIS — R260 Ataxic gait: Secondary | ICD-10-CM | POA: Diagnosis present

## 2017-07-26 DIAGNOSIS — R270 Ataxia, unspecified: Secondary | ICD-10-CM | POA: Diagnosis not present

## 2017-07-26 DIAGNOSIS — Z6826 Body mass index (BMI) 26.0-26.9, adult: Secondary | ICD-10-CM

## 2017-07-26 DIAGNOSIS — E785 Hyperlipidemia, unspecified: Secondary | ICD-10-CM | POA: Diagnosis present

## 2017-07-26 DIAGNOSIS — Z794 Long term (current) use of insulin: Secondary | ICD-10-CM

## 2017-07-26 DIAGNOSIS — R569 Unspecified convulsions: Secondary | ICD-10-CM | POA: Diagnosis not present

## 2017-07-26 DIAGNOSIS — Z955 Presence of coronary angioplasty implant and graft: Secondary | ICD-10-CM

## 2017-07-26 DIAGNOSIS — K219 Gastro-esophageal reflux disease without esophagitis: Secondary | ICD-10-CM | POA: Diagnosis present

## 2017-07-26 DIAGNOSIS — Z8551 Personal history of malignant neoplasm of bladder: Secondary | ICD-10-CM

## 2017-07-26 DIAGNOSIS — I69398 Other sequelae of cerebral infarction: Secondary | ICD-10-CM

## 2017-07-26 DIAGNOSIS — I2510 Atherosclerotic heart disease of native coronary artery without angina pectoris: Secondary | ICD-10-CM | POA: Diagnosis present

## 2017-07-26 DIAGNOSIS — N179 Acute kidney failure, unspecified: Secondary | ICD-10-CM

## 2017-07-26 DIAGNOSIS — R4702 Dysphasia: Secondary | ICD-10-CM | POA: Diagnosis not present

## 2017-07-26 DIAGNOSIS — F329 Major depressive disorder, single episode, unspecified: Secondary | ICD-10-CM | POA: Diagnosis present

## 2017-07-26 DIAGNOSIS — Z7901 Long term (current) use of anticoagulants: Secondary | ICD-10-CM

## 2017-07-26 DIAGNOSIS — Z7902 Long term (current) use of antithrombotics/antiplatelets: Secondary | ICD-10-CM

## 2017-07-26 LAB — DIFFERENTIAL
ABS IMMATURE GRANULOCYTES: 0 10*3/uL (ref 0.0–0.1)
Basophils Absolute: 0.1 10*3/uL (ref 0.0–0.1)
Basophils Relative: 1 %
Eosinophils Absolute: 0.3 10*3/uL (ref 0.0–0.7)
Eosinophils Relative: 4 %
Immature Granulocytes: 0 %
LYMPHS ABS: 1.4 10*3/uL (ref 0.7–4.0)
LYMPHS PCT: 16 %
MONO ABS: 0.5 10*3/uL (ref 0.1–1.0)
MONOS PCT: 6 %
NEUTROS ABS: 6.3 10*3/uL (ref 1.7–7.7)
Neutrophils Relative %: 73 %

## 2017-07-26 LAB — RAPID URINE DRUG SCREEN, HOSP PERFORMED
Amphetamines: NOT DETECTED
Benzodiazepines: NOT DETECTED
COCAINE: NOT DETECTED
OPIATES: NOT DETECTED
TETRAHYDROCANNABINOL: NOT DETECTED

## 2017-07-26 LAB — COMPREHENSIVE METABOLIC PANEL
ALK PHOS: 67 U/L (ref 38–126)
ALT: 12 U/L (ref 0–44)
ANION GAP: 9 (ref 5–15)
AST: 18 U/L (ref 15–41)
Albumin: 3.3 g/dL — ABNORMAL LOW (ref 3.5–5.0)
BILIRUBIN TOTAL: 0.7 mg/dL (ref 0.3–1.2)
BUN: 23 mg/dL (ref 8–23)
CO2: 24 mmol/L (ref 22–32)
Calcium: 9 mg/dL (ref 8.9–10.3)
Chloride: 108 mmol/L (ref 98–111)
Creatinine, Ser: 1.5 mg/dL — ABNORMAL HIGH (ref 0.61–1.24)
GFR calc non Af Amer: 44 mL/min — ABNORMAL LOW (ref >60)
GFR, EST AFRICAN AMERICAN: 52 mL/min — AB (ref >60)
GLUCOSE: 139 mg/dL — AB (ref 70–99)
POTASSIUM: 4 mmol/L (ref 3.5–5.1)
Sodium: 141 mmol/L (ref 135–145)
TOTAL PROTEIN: 5.8 g/dL — AB (ref 6.5–8.1)

## 2017-07-26 LAB — CK: Total CK: 36 U/L — ABNORMAL LOW (ref 49–397)

## 2017-07-26 LAB — APTT: aPTT: 43 seconds — ABNORMAL HIGH (ref 24–36)

## 2017-07-26 LAB — CBC
HCT: 38.8 % — ABNORMAL LOW (ref 39.0–52.0)
HEMOGLOBIN: 12.7 g/dL — AB (ref 13.0–17.0)
MCH: 27 pg (ref 26.0–34.0)
MCHC: 32.7 g/dL (ref 30.0–36.0)
MCV: 82.4 fL (ref 78.0–100.0)
Platelets: 178 10*3/uL (ref 150–400)
RBC: 4.71 MIL/uL (ref 4.22–5.81)
RDW: 14.1 % (ref 11.5–15.5)
WBC: 8.5 10*3/uL (ref 4.0–10.5)

## 2017-07-26 LAB — URINALYSIS, ROUTINE W REFLEX MICROSCOPIC
BILIRUBIN URINE: NEGATIVE
Glucose, UA: NEGATIVE mg/dL
HGB URINE DIPSTICK: NEGATIVE
KETONES UR: NEGATIVE mg/dL
LEUKOCYTES UA: NEGATIVE
NITRITE: NEGATIVE
Protein, ur: 30 mg/dL — AB
Specific Gravity, Urine: 1.013 (ref 1.005–1.030)
pH: 5 (ref 5.0–8.0)

## 2017-07-26 LAB — I-STAT TROPONIN, ED: TROPONIN I, POC: 0.01 ng/mL (ref 0.00–0.08)

## 2017-07-26 LAB — PROTIME-INR
INR: 1.46
Prothrombin Time: 17.6 seconds — ABNORMAL HIGH (ref 11.4–15.2)

## 2017-07-26 LAB — ETHANOL: Alcohol, Ethyl (B): <10 mg/dL (ref <10)

## 2017-07-26 MED ORDER — ACETAMINOPHEN 160 MG/5ML PO SOLN
650.0000 mg | ORAL | Status: DC | PRN
Start: 2017-07-26 — End: 2017-07-30

## 2017-07-26 MED ORDER — CLOPIDOGREL BISULFATE 75 MG PO TABS
75.0000 mg | ORAL_TABLET | Freq: Every day | ORAL | Status: DC
  Administered 2017-07-27 – 2017-07-30 (×4): 75 mg via ORAL
  Filled 2017-07-26 (×4): qty 1

## 2017-07-26 MED ORDER — ACETAMINOPHEN 650 MG RE SUPP: 650.0000 mg | RECTAL | Status: DC | PRN

## 2017-07-26 MED ORDER — SODIUM CHLORIDE 0.9 % IV SOLN
INTRAVENOUS | Status: AC
  Administered 2017-07-26: 23:00:00 via INTRAVENOUS

## 2017-07-26 MED ORDER — SENNOSIDES-DOCUSATE SODIUM 8.6-50 MG PO TABS: 1.0000 | ORAL_TABLET | Freq: Every evening | ORAL | Status: DC | PRN

## 2017-07-26 MED ORDER — SODIUM CHLORIDE 0.9 % IV SOLN: 1000.0000 mL | INTRAVENOUS | Status: DC

## 2017-07-26 MED ORDER — LEVETIRACETAM 500 MG PO TABS
500.0000 mg | ORAL_TABLET | Freq: Two times a day (BID) | ORAL | Status: DC
  Administered 2017-07-26 – 2017-07-30 (×8): 500 mg via ORAL
  Filled 2017-07-26 (×8): qty 1

## 2017-07-26 MED ORDER — PANTOPRAZOLE SODIUM 40 MG PO TBEC
40.0000 mg | DELAYED_RELEASE_TABLET | Freq: Every day | ORAL | Status: DC
  Administered 2017-07-27 – 2017-07-30 (×4): 40 mg via ORAL
  Filled 2017-07-26 (×4): qty 1

## 2017-07-26 MED ORDER — SODIUM CHLORIDE 0.9 % IV BOLUS (SEPSIS)
500.0000 mL | Freq: Once | INTRAVENOUS | Status: AC
  Administered 2017-07-26: 500 mL via INTRAVENOUS

## 2017-07-26 MED ORDER — INSULIN ASPART 100 UNIT/ML ~~LOC~~ SOLN
0.0000 [IU] | SUBCUTANEOUS | Status: DC
  Administered 2017-07-27 – 2017-07-28 (×3): 1 [IU] via SUBCUTANEOUS
  Administered 2017-07-29 (×2): 2 [IU] via SUBCUTANEOUS
  Administered 2017-07-29: 1 [IU] via SUBCUTANEOUS
  Administered 2017-07-30: 2 [IU] via SUBCUTANEOUS

## 2017-07-26 MED ORDER — ACETAMINOPHEN 325 MG PO TABS
650.0000 mg | ORAL_TABLET | ORAL | Status: DC | PRN
  Administered 2017-07-27: 650 mg via ORAL
  Filled 2017-07-26: qty 2

## 2017-07-26 MED ORDER — APIXABAN 5 MG PO TABS
5.0000 mg | ORAL_TABLET | Freq: Two times a day (BID) | ORAL | Status: DC
  Administered 2017-07-26 – 2017-07-30 (×8): 5 mg via ORAL
  Filled 2017-07-26 (×8): qty 1

## 2017-07-26 MED ORDER — STROKE: EARLY STAGES OF RECOVERY BOOK
Freq: Once | Status: AC
  Administered 2017-07-27: 06:00:00
  Filled 2017-07-26: qty 1

## 2017-07-26 MED ORDER — SERTRALINE HCL 100 MG PO TABS
100.0000 mg | ORAL_TABLET | Freq: Every day | ORAL | Status: DC
  Administered 2017-07-27 – 2017-07-30 (×4): 100 mg via ORAL
  Filled 2017-07-26 (×4): qty 1

## 2017-07-26 MED ORDER — EZETIMIBE 10 MG PO TABS
10.0000 mg | ORAL_TABLET | Freq: Every day | ORAL | Status: DC
  Administered 2017-07-27 – 2017-07-30 (×4): 10 mg via ORAL
  Filled 2017-07-26 (×4): qty 1

## 2017-07-26 NOTE — ED Triage Notes (Signed)
Per EMS  Pt from home with wife. Wife reported over the weekend Pt was to weak to walk with walker. PT has had several CVA's  And has receptive  aphaga . Pt answers questions in one word answers.

## 2017-07-26 NOTE — ED Provider Notes (Signed)
[facility] EMERGENCY DEPARTMENT Provider Note   CSN: [id number] Arrival date & time: 07/26/17  1552     History   Chief Complaint Chief Complaint  Patient presents with  . Weakness  . Bradycardia    HPI Carl Stephenson is a 72 y.o. male.  HPI Patient presented to the emergency room for evaluation of increasing weakness according to the EMS report.  Patient has a history of prior strokes.  He was admitted to [facility] with strokes back in the end of May.  Patient resides at home with his wife.  Over the weekend he became too weak to walk with his walker.  The patient himself does not know why he is here.  He denies any complaints of headache.  He denies any complaints of chest pain.  He denies any vomiting, diarrhea or fevers.  He does not feel any weaker than usual Past Medical History:  Diagnosis Date  . Acute encephalopathy admitted 12/28/2013  . Bladder tumor    Recurrent  . Bradycardia 01/16/2016  . Cancer [facility])    "had TURBT; cancer free since ~ 11/2013/MD" (12/27/2013)  . Carotid artery disease ([place])    [place] 12/15: Bilateral ICA 1-39 // [place] 1/18: Bilateral ICA 1-39  . Chronic lower back pain   . Coronary artery disease    a. Cath 12/27/2013 EF 35-40%, 80-90% prox LAD treated with balloon angioplasty and 3.0x28 mm Xience DES, LCx with anomalous origin, 50-60% stenosis in OM, 50% stenosis in PDA and PLA // b. [place] 1/18: mLAD stent ok, dLAD 80, RI stent ok, oLCx stent ok, OM1 85, oRCA stent ok, mRA 40, dRCA 40, RPDA 60/60 >> med Rx  . Diabetes mellitus type II   . GERD (gastroesophageal reflux disease)   . History of echocardiogram    Echo 12/15: Mild LVH, EF 45-50, inf HK, Gr 1 diastolic dysfunction  // Echo 1/18: Moderate LVH, EF 45-50, diffuse HK, mild LAE, RA upper limits of normal // TEE 1/18:  EF 45, diffuse HK, frequent PVCs, trivial MR, mild LAE, normal RVSF, no LA or RA clot, + PFO   . History of hiatal hernia   . Hyperlipidemia   . Hypertension    . 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 Kidney stones    "passed them all" (12/27/2013)  . Obesity   . Seizure disorder as sequela of cerebrovascular accident ([place])   . Stroke [facility]) ~ 2012; 2018   multiple strokes // embolic CVA 01/6965 >> admx with encephalopathy >> s/p ILR  . TIA (transient ischemic attack) "several"  . Tobacco user    Quit 1994  . Wide-complex tachycardia [facility])     Patient Active Problem List   Diagnosis Date Noted  . Weakness 09/07/2016  . Gait abnormality 09/07/2016  . Chronic combined systolic and diastolic CHF (congestive heart failure) ([place]) 02/18/2016  . History of embolic stroke 89/38/1017  . Bradycardia 01/16/2016  . Abnormal EKG   . LBBB (left bundle branch block)   . Coronary artery disease involving native coronary artery of native heart without angina pectoris   . ECG abnormality   . History of CVA (cerebrovascular accident)   . Lactic acidosis   . Seizures ([place])   . Hypotension 01/16/2014  . CVA (cerebral vascular accident) ([place]) 12/29/2013  . Acute encephalopathy 12/28/2013  . Seborrheic dermatitis 10/04/2012  . Microcytosis 11/07/2011  . Aphasia 06/26/2011  . 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  Cerebral thrombosis with cerebral infarction ([place]) 08/14/2010  . Temporary cerebral vascular dysfunction 10/24/2009  . BENIGN NEOPLASM OF BLADDER 03/16/2008  . Hyperlipidemia 03/16/2008  . Obesity 03/16/2008  . Essential hypertension 03/16/2008  . MYOCARDIAL INFARCTION 03/16/2008  . UNSPECIFIED PAROXYSMAL TACHYCARDIA 03/16/2008  . GERD 03/16/2008  . Diabetes mellitus ([place]) 03/16/2008    Past Surgical History:  Procedure Laterality Date  . CARDIAC CATHETERIZATION     "he's had a couple before today where they just looked around" (12/27/2013)  . CARDIAC CATHETERIZATION N/A 01/15/2016   Procedure: Left Heart Cath and Coronary Angiography;  Surgeon: Peter M Martinique, MD;  Location: [facility] CV LAB;  Service: Cardiovascular;  Laterality: N/A;  . CORONARY ANGIOPLASTY WITH STENT PLACEMENT     "he's had 3-4 stents put in before today"   . CORONARY ANGIOPLASTY WITH STENT PLACEMENT  12/27/2013   "1"  . EP IMPLANTABLE DEVICE N/A 01/20/2016   Procedure: Loop Recorder Insertion;  Surgeon: Evans Lance, MD;  Location: [facility] CV LAB;  Service: Cardiovascular;  Laterality: N/A;  . LEFT HEART CATHETERIZATION WITH CORONARY ANGIOGRAM N/A 10/02/2011   Procedure: LEFT HEART CATHETERIZATION WITH CORONARY ANGIOGRAM;  Surgeon: [name] Bow, MD;  Location: [facility] CATH LAB;  Service: Cardiovascular;  Laterality: N/A;  . LEFT HEART CATHETERIZATION WITH CORONARY ANGIOGRAM N/A 12/27/2013   Procedure: LEFT HEART CATHETERIZATION WITH CORONARY ANGIOGRAM;  Surgeon: Blane Ohara, MD;  Location: [facility] CATH LAB;  Service: Cardiovascular;  Laterality: N/A;  . TEE WITHOUT CARDIOVERSION N/A 01/20/2016   Procedure: TRANSESOPHAGEAL ECHOCARDIOGRAM (TEE);  Surgeon: Larey Dresser, MD;  Location: [facility];  Service: Cardiovascular;  Laterality: N/A;  . TRANSURETHRAL RESECTION OF BLADDER TUMOR WITH GYRUS (TURBT-GYRUS)  "several times"        Home Medications    Prior to Admission medications   Medication Sig Start Date End Date Taking? Authorizing Provider  acetaminophen (TYLENOL) 325 MG tablet Take 2 tablets (650 mg total) by mouth every 4 (four) hours as needed for headache or mild pain. 01/21/16   Robbie Lis, MD  apixaban (ELIQUIS) 5 MG TABS tablet Take 1 tablet (5 mg total) by mouth 2 (two) times daily. 06/07/17   Thomasene Ripple, MD  clopidogrel (PLAVIX) 75 MG tablet Take 1 tablet (75 mg total) by mouth daily with breakfast. 06/11/16   Sherren Mocha, MD  ezetimibe (ZETIA) 10 MG tablet Take 1 tablet (10 mg total) by mouth daily. 06/08/17   Thomasene Ripple, MD  fexofenadine (ALLEGRA) 180 MG tablet Take 180 mg by mouth daily.    [provider]  furosemide (LASIX) 40 MG tablet TAKE  1/2 TABLET BY MOUTH DAILY 03/31/17   Sherren Mocha, MD  insulin detemir (LEVEMIR) 100 UNIT/ML injection Inject 0.35 mLs (35 Units total) into the skin daily with supper. 06/07/17   Thomasene Ripple, MD  levETIRAcetam (KEPPRA) 500 MG tablet Take 1 tablet (500 mg total) by mouth 2 (two) times daily. 01/21/16   Robbie Lis, MD  lisinopril (PRINIVIL,ZESTRIL) 10 MG tablet Take 1 tablet (10 mg total) by mouth daily. 06/08/17   Thomasene Ripple, MD  nitroGLYCERIN (NITROSTAT) 0.4 MG SL tablet PLACE 1 TABLET UNDER THE TONGUE EVERY 5 MINUTES AS NEEDED FOR CHEST PAIN 02/19/16   Richardson Dopp T, PA-C  pantoprazole (PROTONIX) 40 MG tablet Take 40 mg by mouth daily.      [provider]  potassium chloride SA (K-DUR,KLOR-CON) 20 MEQ tablet Take 1 tablet (20 mEq total) by mouth daily. 06/11/16  Sherren Mocha, MD  sertraline (ZOLOFT) 100 MG tablet Take 1.5 tablets (150 mg total) by mouth daily. Patient taking differently: Take 100 mg by mouth daily.  10/21/16   Garvin Fila, MD    Family History Family History  Problem Relation Age of Onset  . Coronary artery disease Other   . Heart failure Father   . Heart disease Father     Social History Social History   Tobacco Use  . Smoking status: Former Smoker    Packs/day: 3.00    Years: 22.00    Pack years: 66.00    Types: Cigarettes    Last attempt to quit: 08/12/1992    Years since quitting: 24.9  . Smokeless tobacco: Never Used  Substance Use Topics  . Alcohol use: No  . Drug use: No     Allergies   Statins; Atorvastatin; and Nitrofurantoin monohyd macro   Review of Systems Review of Systems  Constitutional: Negative for fever.  Respiratory: Negative for choking and shortness of breath.   Cardiovascular: Negative for chest pain.  Gastrointestinal: Negative for abdominal pain.  Genitourinary: Negative for dysuria.  Neurological: Negative for headaches.  All other systems reviewed and are negative.    Physical Exam Updated  Vital Signs BP (!) 148/58   Pulse 66   Temp 97.8 F (36.6 C)   Resp 16   SpO2 97%   Physical Exam  Constitutional: No distress.  HENT:  Head: Normocephalic and atraumatic.  Right Ear: External ear normal.  Left Ear: External ear normal.  Eyes: Conjunctivae are normal. Right eye exhibits no discharge. Left eye exhibits no discharge. No scleral icterus.  Neck: Neck supple. No tracheal deviation present.  Cardiovascular: Normal rate, regular rhythm and intact distal pulses.  Pulmonary/Chest: Effort normal and breath sounds normal. No stridor. No respiratory distress. He has no wheezes. He has no rales.  Abdominal: Soft. Bowel sounds are normal. He exhibits no distension. There is no tenderness. There is no rebound and no guarding.  Musculoskeletal: He exhibits no edema or tenderness.  Neurological: He is alert. No cranial nerve deficit (Mild left facial droop, extraocular movements intact, no slurred speech ) or sensory deficit. He exhibits normal muscle tone. He displays no seizure activity. Coordination normal.  Patient is able to lift both arms off the bed, but weaker on the left, he is able to lift both legs off the bed but is weaker on the left   Skin: Skin is warm and dry. No rash noted. He is not diaphoretic.  Psychiatric: He has a normal mood and affect.  Nursing note and vitals reviewed.    ED Treatments / Results  Labs (all labs ordered are listed, but only abnormal results are displayed) Labs Reviewed  PROTIME-INR - Abnormal; Notable for the following components:      Result Value   Prothrombin Time 17.6 (*)    All other components within normal limits  APTT - Abnormal; Notable for the following components:   aPTT 43 (*)    All other components within normal limits  CBC - Abnormal; Notable for the following components:   Hemoglobin 12.7 (*)    HCT 38.8 (*)    All other components within normal limits  COMPREHENSIVE METABOLIC PANEL - Abnormal; Notable for the  following components:   Glucose, Bld 139 (*)    Creatinine, Ser 1.50 (*)    Total Protein 5.8 (*)    Albumin 3.3 (*)    GFR calc non Af Amer 44 (*)  GFR calc Af Amer 52 (*)    All other components within normal limits  RAPID URINE DRUG SCREEN, HOSP PERFORMED - Abnormal; Notable for the following components:   Barbiturates   (*)    Value: Result not available. Reagent lot number recalled by manufacturer.   All other components within normal limits  URINALYSIS, ROUTINE W REFLEX MICROSCOPIC - Abnormal; Notable for the following components:   Protein, ur 30 (*)    Bacteria, UA RARE (*)    All other components within normal limits  ETHANOL  DIFFERENTIAL  I-STAT TROPONIN, ED    EKG EKG Interpretation  Date/Time:  Monday July 26 2017 16:05:28 EDT Ventricular Rate:  64 PR Interval:    QRS Duration: 108 QT Interval:  412 QTC Calculation: 426 R Axis:   45 Text Interpretation:  Sinus rhythm Multiform ventricular premature complexes Inferior infarct, old Anteroseptal infarct, old No significant change since last tracing Confirmed by Dorie Rank [id number]) on 07/26/2017 4:23:12 PM   Radiology Ct Head Wo Contrast  Result Date: 07/26/2017 CLINICAL DATA:  Increasing weakness over the past few days. Unable to walk due to weakness. EXAM: CT HEAD WITHOUT CONTRAST TECHNIQUE: Contiguous axial images were obtained from the base of the skull through the vertex without intravenous contrast. COMPARISON:  CT head 06/04/2017. MR head 06/04/2017. FINDINGS: Brain: No definite acute stroke, acute hemorrhage, mass lesion, hydrocephalus, or extra-axial fluid. Advanced cerebral and cerebellar atrophy. Hypoattenuation of white matter, likely small vessel disease. Evolutionary change of the previously noted acute infarction to subacute/chronic appearance in the RIGHT frontal deep white matter and LEFT internal capsular regions. Continued hypodensity of the brainstem representing chronic ischemia. Vascular:  Calcification of the cavernous internal carotid arteries consistent with cerebrovascular atherosclerotic disease. No signs of intracranial large vessel occlusion. Skull: Calvarium intact. Sinuses/Orbits: Clear sinuses. Other: None. IMPRESSION: Advanced atrophy and small vessel disease. No definite acute intracranial findings. Electronically Signed   By: Staci Righter M.D.   On: 07/26/2017 18:07    Procedures Procedures (including critical care time)  Medications Ordered in ED Medications - No data to display   Initial Impression / Assessment and Plan / ED Course  I have reviewed the triage vital signs and the nursing notes.  Pertinent labs & imaging results that were available during my care of the patient were reviewed by me and considered in my medical decision making (see chart for details).  Clinical Course as of Jul 27 2006  Mon Jul 26, 2017  2006 Patient attempted to stand here in the ED.  Family states he had a very difficult time   [JK]    Clinical Course User Index [JK] Dorie Rank, MD   Patient presents emergency room for increasing weakness.  Patient had a history of strokes in the past with similar presentation.  ED work-up is unremarkable.  No signs of any metabolic issues that would cause his weakness other than a slight increase in his creatinine.  No signs of acute infection.  I am concerned about the possibility of an occult stroke since he is having difficulty walking still.  Will consult with medical service to bring him in for further evaluation including MRI   Final Clinical Impressions(s) / ED Diagnoses   Final diagnoses:  Ataxia      Dorie Rank, MD 07/26/17 2023

## 2017-07-26 NOTE — H&P (Signed)
History and Physical    GRAESYN SCHREIFELS GYJ:[id number] DOB: 05/17/1945 DOA: 07/26/2017  PCP: Myer Peer, MD   Patient coming from: home  Chief Complaint: generalized weakness, unable to walk  HPI: Carl Stephenson is a 72 y.o. male with medical history significant for chronic combined systolic/diastolic CHF, insulin-dependent diabetes mellitus, chronic kidney disease stage III, and history of multiple strokes, and presenting to the emergency department with 2 days of generalized weakness with gait difficulty. Patient was admitted to the hospital in May with similar symptoms, new areas of ischemia were noted on MRI brain despite dual antiplatelet therapy. He was discharged home on Eliquis at that time and had been fairly stable until developing a poor appetite and generalized weakness with a past 2 days. He is typically able to ambulate with a walker, but has been unable to do this for the past 2 days. There has not been any significant cough, no diarrhea or vomiting, and the patient has not complained of anything.  ED Course: Upon arrival to the ED, patient is found to be afebrile, saturating mid 80s on room air, and with vitals otherwise normal. EKG features a sinus rhythm with frequent PVCs. Noncontrast head CT is negative for definite acute finding. Chemistry panel features a creatinine 1.50, up from 1.18 in May. CBC is unremarkable, UDS negative, ethanol level undetectable, and urinalysis unremarkable. Patient was given 500 mL normal saline in the ED. He remains medically stable, and no apparent distress, but too weak to stand without intensive assistance. He will be observed for ongoing evaluation and management of this.  Review of Systems:  All other systems reviewed and apart from HPI, are negative.  Past Medical History:  Diagnosis Date  . Acute encephalopathy admitted 12/28/2013  . Bladder tumor    Recurrent  . Bradycardia 01/16/2016  . Cancer [facility])    "had TURBT; cancer free  since ~ 11/2013/MD" (12/27/2013)  . Carotid artery disease ([place])    [place] 12/15: Bilateral ICA 1-39 // [place] 1/18: Bilateral ICA 1-39  . Chronic lower back pain   . Coronary artery disease    a. Cath 12/27/2013 EF 35-40%, 80-90% prox LAD treated with balloon angioplasty and 3.0x28 mm Xience DES, LCx with anomalous origin, 50-60% stenosis in OM, 50% stenosis in PDA and PLA // b. [place] 1/18: mLAD stent ok, dLAD 80, RI stent ok, oLCx stent ok, OM1 85, oRCA stent ok, mRA 40, dRCA 40, RPDA 60/60 >> med Rx  . Diabetes mellitus type II   . GERD (gastroesophageal reflux disease)   . History of echocardiogram    Echo 12/15: Mild LVH, EF 45-50, inf HK, Gr 1 diastolic dysfunction  // Echo 1/18: Moderate LVH, EF 45-50, diffuse HK, mild LAE, RA upper limits of normal // TEE 1/18:  EF 45, diffuse HK, frequent PVCs, trivial MR, mild LAE, normal RVSF, no LA or RA clot, + PFO   . History of hiatal hernia   . Hyperlipidemia   . Hypertension   . 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 Kidney stones    "passed them all" (12/27/2013)  . Obesity   . Seizure disorder as sequela of cerebrovascular accident ([place])   . Stroke [facility]) ~ 2012; 2018   multiple strokes // embolic CVA 02/6376 >> admx with encephalopathy >> s/p ILR  . TIA (transient ischemic attack) "several"  . Tobacco user    Quit 1994  . Wide-complex tachycardia ([place])  Past Surgical History:  Procedure Laterality Date  . CARDIAC CATHETERIZATION     "he's had a couple before today where they just looked around" (12/27/2013)  . CARDIAC CATHETERIZATION N/A 01/15/2016   Procedure: Left Heart Cath and Coronary Angiography;  Surgeon: Peter M Martinique, MD;  Location: [place] [facility];  Service: Cardiovascular;  Laterality: N/A;  . CORONARY ANGIOPLASTY WITH STENT PLACEMENT     "he's had 3-4 stents put in before today"   . CORONARY ANGIOPLASTY WITH STENT PLACEMENT  12/27/2013   "1"  . EP IMPLANTABLE DEVICE N/A 01/20/2016    Procedure: Loop Recorder Insertion;  Surgeon: Evans Lance, MD;  Location: [place] [facility];  Service: Cardiovascular;  Laterality: N/A;  . LEFT HEART CATHETERIZATION WITH CORONARY ANGIOGRAM N/A 10/02/2011   Procedure: LEFT HEART CATHETERIZATION WITH CORONARY ANGIOGRAM;  Surgeon: [name] Bow, MD;  Location: [facility] CATH LAB;  Service: Cardiovascular;  Laterality: N/A;  . LEFT HEART CATHETERIZATION WITH CORONARY ANGIOGRAM N/A 12/27/2013   Procedure: LEFT HEART CATHETERIZATION WITH CORONARY ANGIOGRAM;  Surgeon: Blane Ohara, MD;  Location: [facility] CATH LAB;  Service: Cardiovascular;  Laterality: N/A;  . TEE WITHOUT CARDIOVERSION N/A 01/20/2016   Procedure: TRANSESOPHAGEAL ECHOCARDIOGRAM (TEE);  Surgeon: Larey Dresser, MD;  Location: [place];  Service: Cardiovascular;  Laterality: N/A;  . TRANSURETHRAL RESECTION OF BLADDER TUMOR WITH GYRUS (TURBT-GYRUS)  "several times"     reports that he quit smoking about 24 years ago. His smoking use included cigarettes. He has a 66.00 pack-year smoking history. He has never used smokeless tobacco. He reports that he does not drink alcohol or use drugs.  Allergies  Allergen Reactions  . Statins Nausea Only    mylgia  . Atorvastatin Other (See Comments)    Leg weakness  . Nitrofurantoin Monohyd Macro Hives and Rash    Family History  Problem Relation Age of Onset  . Coronary artery disease Other   . Heart failure Father   . Heart disease Father      Prior to Admission medications   Medication Sig Start Date End Date Taking? Authorizing Provider  acetaminophen (TYLENOL) 325 MG tablet Take 2 tablets (650 mg total) by mouth every 4 (four) hours as needed for headache or mild pain. 01/21/16   Robbie Lis, MD  apixaban (ELIQUIS) 5 MG TABS tablet Take 1 tablet (5 mg total) by mouth 2 (two) times daily. 06/07/17   Thomasene Ripple, MD  clopidogrel (PLAVIX) 75 MG tablet Take 1 tablet (75 mg total) by mouth daily with breakfast. 06/11/16   Sherren Mocha, MD  ezetimibe (ZETIA) 10 MG tablet Take 1 tablet (10 mg total) by mouth daily. 06/08/17   Thomasene Ripple, MD  fexofenadine (ALLEGRA) 180 MG tablet Take 180 mg by mouth daily.    [provider]  furosemide (LASIX) 40 MG tablet TAKE 1/2 TABLET BY MOUTH DAILY 03/31/17   Sherren Mocha, MD  insulin detemir (LEVEMIR) 100 UNIT/ML injection Inject 0.35 mLs (35 Units total) into the skin daily with supper. 06/07/17   Thomasene Ripple, MD  levETIRAcetam (KEPPRA) 500 MG tablet Take 1 tablet (500 mg total) by mouth 2 (two) times daily. 01/21/16   Robbie Lis, MD  lisinopril (PRINIVIL,ZESTRIL) 10 MG tablet Take 1 tablet (10 mg total) by mouth daily. 06/08/17   Thomasene Ripple, MD  nitroGLYCERIN (NITROSTAT) 0.4 MG SL tablet PLACE 1 TABLET UNDER THE TONGUE EVERY 5 MINUTES AS NEEDED FOR CHEST PAIN 02/19/16   Richardson Dopp T, PA-C  pantoprazole (PROTONIX) 40 MG tablet Take 40 mg by mouth daily.      [provider]  potassium chloride SA (K-DUR,KLOR-CON) 20 MEQ tablet Take 1 tablet (20 mEq total) by mouth daily. 06/11/16   Sherren Mocha, MD  sertraline (ZOLOFT) 100 MG tablet Take 1.5 tablets (150 mg total) by mouth daily. Patient taking differently: Take 100 mg by mouth daily.  10/21/16   Garvin Fila, MD    Physical Exam: Vitals:   07/26/17 1815 07/26/17 1830 07/26/17 1900 07/26/17 1930  BP: 131/63 129/71 (!) 142/72 (!) 148/58  Pulse: 62 73 (!) 56 66  Resp: 17 16 17 16   Temp:      SpO2: 98% 96% 98% 97%      Constitutional: NAD, calm  Eyes: PERTLA, lids and conjunctivae normal ENMT: Mucous membranes are moist. Posterior pharynx clear of any exudate or lesions.   Neck: normal, supple, no masses, no thyromegaly Respiratory: clear to auscultation bilaterally, no wheezing, no crackles. Normal respiratory effort.   Cardiovascular: S1 & S2 heard, regular rate and rhythm. No extremity edema. No significant JVD. Abdomen: No distension, no tenderness, soft. Bowel sounds normal.    Musculoskeletal: no clubbing / cyanosis. No joint deformity upper and lower extremities.    Skin: no significant rashes, lesions, ulcers. Warm, dry, well-perfused. Neurologic: Aphasia. Left facial weakness. Sensation to light touch intact. Global motor weakness.  Psychiatric: Alert and oriented to person, place, and situation. Calm, cooperative.     Labs on Admission: I have personally reviewed following labs and imaging studies  CBC: Recent Labs  Lab 07/26/17 1700  WBC 8.5  NEUTROABS 6.3  HGB 12.7*  HCT 38.8*  MCV 82.4  PLT 563   Basic Metabolic Panel: Recent Labs  Lab 07/26/17 1700  NA 141  K 4.0  CL 108  CO2 24  GLUCOSE 139*  BUN 23  CREATININE 1.50*  CALCIUM 9.0   GFR: CrCl cannot be calculated (Unknown ideal weight.). Liver Function Tests: Recent Labs  Lab 07/26/17 1700  AST 18  ALT 12  ALKPHOS 67  BILITOT 0.7  PROT 5.8*  ALBUMIN 3.3*   No results for input(s): LIPASE, AMYLASE in the last 168 hours. No results for input(s): AMMONIA in the last 168 hours. Coagulation Profile: Recent Labs  Lab 07/26/17 1700  INR 1.46   Cardiac Enzymes: No results for input(s): CKTOTAL, CKMB, CKMBINDEX, TROPONINI in the last 168 hours. BNP (last 3 results) No results for input(s): PROBNP in the last 8760 hours. HbA1C: No results for input(s): HGBA1C in the last 72 hours. CBG: No results for input(s): GLUCAP in the last 168 hours. Lipid Profile: No results for input(s): CHOL, HDL, LDLCALC, TRIG, CHOLHDL, LDLDIRECT in the last 72 hours. Thyroid Function Tests: No results for input(s): TSH, T4TOTAL, FREET4, T3FREE, THYROIDAB in the last 72 hours. Anemia Panel: No results for input(s): VITAMINB12, FOLATE, FERRITIN, TIBC, IRON, RETICCTPCT in the last 72 hours. Urine analysis:    Component Value Date/Time   COLORURINE YELLOW 07/26/2017 1901   APPEARANCEUR CLEAR 07/26/2017 1901   LABSPEC 1.013 07/26/2017 1901   PHURINE 5.0 07/26/2017 1901   GLUCOSEU NEGATIVE  07/26/2017 1901   HGBUR NEGATIVE 07/26/2017 1901   BILIRUBINUR NEGATIVE 07/26/2017 1901   KETONESUR NEGATIVE 07/26/2017 1901   PROTEINUR 30 (A) 07/26/2017 1901   UROBILINOGEN 0.2 12/29/2013 0040   NITRITE NEGATIVE 07/26/2017 1901   LEUKOCYTESUR NEGATIVE 07/26/2017 1901   Sepsis Labs: @LABRCNTIP (procalcitonin:4,lacticidven:4) )No results found for this or any previous visit (from the  past 240 hour(s)).   Radiological Exams on Admission: Ct Head Wo Contrast  Result Date: 07/26/2017 CLINICAL DATA:  Increasing weakness over the past few days. Unable to walk due to weakness. EXAM: CT HEAD WITHOUT CONTRAST TECHNIQUE: Contiguous axial images were obtained from the base of the skull through the vertex without intravenous contrast. COMPARISON:  CT head 06/04/2017. MR head 06/04/2017. FINDINGS: Brain: No definite acute stroke, acute hemorrhage, mass lesion, hydrocephalus, or extra-axial fluid. Advanced cerebral and cerebellar atrophy. Hypoattenuation of white matter, likely small vessel disease. Evolutionary change of the previously noted acute infarction to subacute/chronic appearance in the RIGHT frontal deep white matter and LEFT internal capsular regions. Continued hypodensity of the brainstem representing chronic ischemia. Vascular: Calcification of the cavernous internal carotid arteries consistent with cerebrovascular atherosclerotic disease. No signs of intracranial large vessel occlusion. Skull: Calvarium intact. Sinuses/Orbits: Clear sinuses. Other: None. IMPRESSION: Advanced atrophy and small vessel disease. No definite acute intracranial findings. Electronically Signed   By: Staci Righter M.D.   On: 07/26/2017 18:07    EKG: Independently reviewed. Sinus rhythm, PVC's.   Assessment/Plan  1. Generalized weakness  - Presents with 2 days of generalized weakness, unable to ambulate with walker as usual and has difficulty standing without intensive assistance  - Head CT without definite acute  finding but difficult to exclude recurrent CVA  - Continue cardiac monitoring; check MRI brain, CK, B12, and folate; PT eval, supportive care    2. Hx of CVA  - Pt has hx of multiple CVA's and residual motor and speech deficits  - He was started on Eliquis in May 2019 after his most recent CVA  - No definite new focal findings, but difficult to exclude acute CVA as noted above  - Continue Eliquis, Plavix; has intolerance to statins, continue Zetia    3. Chronic combined CHF  - Appears well-compensated  - Treated with 500 cc NS in ED  - He has recent poor appetite, mild increase in creatinine, and no s/s of hypervolemia  - Continue gentle IVF hydration, hold Lasix, hold lisinopril initially, follow daily wt and I/O's    4. CKD stage III  - SCr is 1.50 on admission, up from most recent prior of 1.18  - Treated in ED with 500 cc NS  - He has poor appetite recently and appears hypovolemic, will hold Lasix and continue a gentle IVF hydration overnight, renally-dose medications, and repeat chem panel in am    5. Insulin-dependent DM  - A1c was only 5.8% in May  - Managed at home with Levemir 35 units qHS  - Follow CBG's and start with SSI only    6. Seizure disorder  - No recent seizure-like activity  - Continue Keppra    DVT prophylaxis: Eliquis  Code Status: Full  Family Communication: Family updated at bedside Consults called: None Admission status: Observation     Vianne Bulls, MD [facility] Pager [phone]  If 7PM-7AM, please contact night-coverage [email or web address] Password [email or web address]  07/26/2017, 8:50 PM

## 2017-07-27 ENCOUNTER — Observation Stay (HOSPITAL_COMMUNITY): Payer: Medicare Other

## 2017-07-27 ENCOUNTER — Other Ambulatory Visit: Payer: Self-pay

## 2017-07-27 DIAGNOSIS — I6349 Cerebral infarction due to embolism of other cerebral artery: Secondary | ICD-10-CM

## 2017-07-27 DIAGNOSIS — J439 Emphysema, unspecified: Secondary | ICD-10-CM | POA: Diagnosis not present

## 2017-07-27 DIAGNOSIS — Z743 Need for continuous supervision: Secondary | ICD-10-CM | POA: Diagnosis not present

## 2017-07-27 DIAGNOSIS — G4089 Other seizures: Secondary | ICD-10-CM | POA: Diagnosis not present

## 2017-07-27 DIAGNOSIS — R29708 NIHSS score 8: Secondary | ICD-10-CM | POA: Diagnosis present

## 2017-07-27 DIAGNOSIS — Z79899 Other long term (current) drug therapy: Secondary | ICD-10-CM | POA: Diagnosis not present

## 2017-07-27 DIAGNOSIS — R5383 Other fatigue: Secondary | ICD-10-CM | POA: Diagnosis not present

## 2017-07-27 DIAGNOSIS — I10 Essential (primary) hypertension: Secondary | ICD-10-CM | POA: Diagnosis not present

## 2017-07-27 DIAGNOSIS — Z6826 Body mass index (BMI) 26.0-26.9, adult: Secondary | ICD-10-CM | POA: Diagnosis not present

## 2017-07-27 DIAGNOSIS — Z87442 Personal history of urinary calculi: Secondary | ICD-10-CM | POA: Diagnosis not present

## 2017-07-27 DIAGNOSIS — R1310 Dysphagia, unspecified: Secondary | ICD-10-CM | POA: Diagnosis present

## 2017-07-27 DIAGNOSIS — R270 Ataxia, unspecified: Secondary | ICD-10-CM

## 2017-07-27 DIAGNOSIS — E785 Hyperlipidemia, unspecified: Secondary | ICD-10-CM | POA: Diagnosis present

## 2017-07-27 DIAGNOSIS — R531 Weakness: Secondary | ICD-10-CM

## 2017-07-27 DIAGNOSIS — K219 Gastro-esophageal reflux disease without esophagitis: Secondary | ICD-10-CM | POA: Diagnosis present

## 2017-07-27 DIAGNOSIS — R2689 Other abnormalities of gait and mobility: Secondary | ICD-10-CM | POA: Diagnosis not present

## 2017-07-27 DIAGNOSIS — Z794 Long term (current) use of insulin: Secondary | ICD-10-CM | POA: Diagnosis not present

## 2017-07-27 DIAGNOSIS — N179 Acute kidney failure, unspecified: Secondary | ICD-10-CM | POA: Diagnosis not present

## 2017-07-27 DIAGNOSIS — Z7901 Long term (current) use of anticoagulants: Secondary | ICD-10-CM | POA: Diagnosis not present

## 2017-07-27 DIAGNOSIS — I6930 Unspecified sequelae of cerebral infarction: Secondary | ICD-10-CM | POA: Diagnosis not present

## 2017-07-27 DIAGNOSIS — I6389 Other cerebral infarction: Secondary | ICD-10-CM | POA: Diagnosis not present

## 2017-07-27 DIAGNOSIS — R41841 Cognitive communication deficit: Secondary | ICD-10-CM | POA: Diagnosis not present

## 2017-07-27 DIAGNOSIS — F329 Major depressive disorder, single episode, unspecified: Secondary | ICD-10-CM | POA: Diagnosis present

## 2017-07-27 DIAGNOSIS — I6340 Cerebral infarction due to embolism of unspecified cerebral artery: Secondary | ICD-10-CM

## 2017-07-27 DIAGNOSIS — Z87891 Personal history of nicotine dependence: Secondary | ICD-10-CM | POA: Diagnosis not present

## 2017-07-27 DIAGNOSIS — N183 Chronic kidney disease, stage 3 (moderate): Secondary | ICD-10-CM

## 2017-07-27 DIAGNOSIS — R161 Splenomegaly, not elsewhere classified: Secondary | ICD-10-CM | POA: Diagnosis not present

## 2017-07-27 DIAGNOSIS — Z7902 Long term (current) use of antithrombotics/antiplatelets: Secondary | ICD-10-CM | POA: Diagnosis not present

## 2017-07-27 DIAGNOSIS — I69398 Other sequelae of cerebral infarction: Secondary | ICD-10-CM | POA: Diagnosis not present

## 2017-07-27 DIAGNOSIS — R569 Unspecified convulsions: Secondary | ICD-10-CM | POA: Diagnosis present

## 2017-07-27 DIAGNOSIS — I130 Hypertensive heart and chronic kidney disease with heart failure and stage 1 through stage 4 chronic kidney disease, or unspecified chronic kidney disease: Secondary | ICD-10-CM | POA: Diagnosis present

## 2017-07-27 DIAGNOSIS — R1312 Dysphagia, oropharyngeal phase: Secondary | ICD-10-CM | POA: Diagnosis not present

## 2017-07-27 DIAGNOSIS — M6281 Muscle weakness (generalized): Secondary | ICD-10-CM | POA: Diagnosis not present

## 2017-07-27 DIAGNOSIS — R260 Ataxic gait: Secondary | ICD-10-CM | POA: Diagnosis present

## 2017-07-27 DIAGNOSIS — R279 Unspecified lack of coordination: Secondary | ICD-10-CM | POA: Diagnosis not present

## 2017-07-27 DIAGNOSIS — R2681 Unsteadiness on feet: Secondary | ICD-10-CM | POA: Diagnosis not present

## 2017-07-27 DIAGNOSIS — I2510 Atherosclerotic heart disease of native coronary artery without angina pectoris: Secondary | ICD-10-CM | POA: Diagnosis present

## 2017-07-27 DIAGNOSIS — E1122 Type 2 diabetes mellitus with diabetic chronic kidney disease: Secondary | ICD-10-CM | POA: Diagnosis present

## 2017-07-27 DIAGNOSIS — I639 Cerebral infarction, unspecified: Secondary | ICD-10-CM | POA: Diagnosis present

## 2017-07-27 DIAGNOSIS — I252 Old myocardial infarction: Secondary | ICD-10-CM | POA: Diagnosis not present

## 2017-07-27 DIAGNOSIS — E119 Type 2 diabetes mellitus without complications: Secondary | ICD-10-CM | POA: Diagnosis not present

## 2017-07-27 DIAGNOSIS — E1159 Type 2 diabetes mellitus with other circulatory complications: Secondary | ICD-10-CM | POA: Diagnosis not present

## 2017-07-27 DIAGNOSIS — I5042 Chronic combined systolic (congestive) and diastolic (congestive) heart failure: Secondary | ICD-10-CM | POA: Diagnosis present

## 2017-07-27 DIAGNOSIS — R278 Other lack of coordination: Secondary | ICD-10-CM | POA: Diagnosis not present

## 2017-07-27 DIAGNOSIS — E440 Moderate protein-calorie malnutrition: Secondary | ICD-10-CM | POA: Diagnosis present

## 2017-07-27 DIAGNOSIS — G8194 Hemiplegia, unspecified affecting left nondominant side: Secondary | ICD-10-CM | POA: Diagnosis present

## 2017-07-27 LAB — BASIC METABOLIC PANEL
ANION GAP: 8 (ref 5–15)
BUN: 20 mg/dL (ref 8–23)
CHLORIDE: 109 mmol/L (ref 98–111)
CO2: 26 mmol/L (ref 22–32)
Calcium: 9.1 mg/dL (ref 8.9–10.3)
Creatinine, Ser: 1.28 mg/dL — ABNORMAL HIGH (ref 0.61–1.24)
GFR calc Af Amer: >60 mL/min (ref >60)
GFR, EST NON AFRICAN AMERICAN: 54 mL/min — AB (ref >60)
GLUCOSE: 132 mg/dL — AB (ref 70–99)
POTASSIUM: 3.7 mmol/L (ref 3.5–5.1)
Sodium: 143 mmol/L (ref 135–145)

## 2017-07-27 LAB — TSH: TSH: 1.819 u[IU]/mL (ref 0.350–4.500)

## 2017-07-27 LAB — GLUCOSE, CAPILLARY
GLUCOSE-CAPILLARY: 114 mg/dL — AB (ref 70–99)
GLUCOSE-CAPILLARY: 117 mg/dL — AB (ref 70–99)
GLUCOSE-CAPILLARY: 118 mg/dL — AB (ref 70–99)
GLUCOSE-CAPILLARY: 125 mg/dL — AB (ref 70–99)
GLUCOSE-CAPILLARY: 127 mg/dL — AB (ref 70–99)
Glucose-Capillary: 124 mg/dL — ABNORMAL HIGH (ref 70–99)

## 2017-07-27 LAB — VITAMIN B12: Vitamin B-12: 458 pg/mL (ref 180–914)

## 2017-07-27 MED ORDER — PRAVASTATIN SODIUM 20 MG PO TABS
20.0000 mg | ORAL_TABLET | Freq: Every day | ORAL | Status: DC
  Administered 2017-07-27 – 2017-07-29 (×3): 20 mg via ORAL
  Filled 2017-07-27 (×3): qty 1

## 2017-07-27 MED ORDER — OMEGA-3-ACID ETHYL ESTERS 1 G PO CAPS
1.0000 g | ORAL_CAPSULE | Freq: Two times a day (BID) | ORAL | Status: DC
  Administered 2017-07-27 – 2017-07-30 (×7): 1 g via ORAL
  Filled 2017-07-27 (×7): qty 1

## 2017-07-27 MED ORDER — IOPAMIDOL (ISOVUE-300) INJECTION 61%
INTRAVENOUS | Status: AC
  Filled 2017-07-27: qty 30

## 2017-07-27 MED ORDER — IOHEXOL 300 MG/ML  SOLN
100.0000 mL | Freq: Once | INTRAMUSCULAR | Status: AC | PRN
  Administered 2017-07-27: 100 mL via INTRAVENOUS

## 2017-07-27 NOTE — Consult Note (Signed)
Physical Medicine and Rehabilitation Consult   Reason for Consult: Stroke with functional decline.  Referring Physician: Dr. Dyann Kief   HPI: Carl Stephenson is a 72 y.o. male with history of CAD, bladder cancer, chronic combined CHF, T2DM, CKD, multiple strokes --last 5/19 who was admitted on 07/26/17 with generalized weakness X 2 days and inability to walk. Since d/c from [place], patient has required assistance with transfers, ADLs, meals and was able to ambulate with min assist/walker.  MRI brain done revealing  Three subcentimeter acute/subacute infarcts in left frontal and right putamen and advanced chronic small vessel disease. He was treated with IVF due to acute on chronic renal failure and admitted for further work up. Patient with chronic dysphagia and ST recommended dysphagia 2, thins. Therapy evaluations done today revealing diffuse weakness with difficulty following commands. CIR recommended due to functional decline.    Review of Systems  Unable to perform ROS: Medical condition      Past Medical History:  Diagnosis Date  . Acute encephalopathy admitted 12/28/2013  . Bladder tumor    Recurrent  . Bradycardia 01/16/2016  . Cancer [facility])    "had TURBT; cancer free since ~ 11/2013/MD" (12/27/2013)  . Carotid artery disease ([place])    [place] 12/15: Bilateral ICA 1-39 // [place] 1/18: Bilateral ICA 1-39  . Chronic lower back pain   . Coronary artery disease    a. Cath 12/27/2013 EF 35-40%, 80-90% prox LAD treated with balloon angioplasty and 3.0x28 mm Xience DES, LCx with anomalous origin, 50-60% stenosis in OM, 50% stenosis in PDA and PLA // b. [place] 1/18: mLAD stent ok, dLAD 80, RI stent ok, oLCx stent ok, OM1 85, oRCA stent ok, mRA 40, dRCA 40, RPDA 60/60 >> med Rx  . Diabetes mellitus type II   . GERD (gastroesophageal reflux disease)   . History of echocardiogram    Echo 12/15: Mild LVH, EF 45-50, inf HK, Gr 1 diastolic dysfunction  // Echo 1/18: Moderate LVH, EF 45-50, diffuse  HK, mild LAE, RA upper limits of normal // TEE 1/18:  EF 45, diffuse HK, frequent PVCs, trivial MR, mild LAE, normal RVSF, no LA or RA clot, + PFO   . History of hiatal hernia   . Hyperlipidemia   . Hypertension   . 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 Kidney stones    "passed them all" (12/27/2013)  . Obesity   . Seizure disorder as sequela of cerebrovascular accident ([place])   . 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 TIA (transient ischemic attack) "several"  . Tobacco user    Quit 1994  . Wide-complex tachycardia ([place])     Past Surgical History:  Procedure Laterality Date  . CARDIAC CATHETERIZATION     "he's had a couple before today where they just looked around" (12/27/2013)  . CARDIAC CATHETERIZATION N/A 01/15/2016   Procedure: Left Heart Cath and Coronary Angiography;  Surgeon: Peter M Martinique, MD;  Location: [place] [facility];  Service: Cardiovascular;  Laterality: N/A;  . CORONARY ANGIOPLASTY WITH STENT PLACEMENT     "he's had 3-4 stents put in before today"   . CORONARY ANGIOPLASTY WITH STENT PLACEMENT  12/27/2013   "1"  . EP IMPLANTABLE DEVICE N/A 01/20/2016   Procedure: Loop Recorder Insertion;  Surgeon: Evans Lance, MD;  Location: [place] [facility];  Service: Cardiovascular;  Laterality: N/A;  . LEFT HEART CATHETERIZATION WITH CORONARY ANGIOGRAM N/A 10/02/2011   Procedure: LEFT HEART CATHETERIZATION WITH CORONARY ANGIOGRAM;  Surgeon: [name] Bow, MD;  Location: [facility] CATH LAB;  Service: Cardiovascular;  Laterality: N/A;  . LEFT HEART CATHETERIZATION WITH CORONARY ANGIOGRAM N/A 12/27/2013   Procedure: LEFT HEART CATHETERIZATION WITH CORONARY ANGIOGRAM;  Surgeon: Blane Ohara, MD;  Location: [facility] CATH LAB;  Service: Cardiovascular;  Laterality: N/A;  . TEE WITHOUT CARDIOVERSION N/A 01/20/2016   Procedure: TRANSESOPHAGEAL ECHOCARDIOGRAM (TEE);  Surgeon: Larey Dresser, MD;  Location: [place];  Service: Cardiovascular;  Laterality: N/A;  . TRANSURETHRAL RESECTION OF BLADDER TUMOR WITH GYRUS (TURBT-GYRUS)  "several times"   Family History  Problem Relation Age of Onset  . Coronary artery disease Other   . Heart failure Father   . Heart disease Father     Social History:  reports that he quit smoking about 24 years ago. His smoking use included cigarettes. He has a 66.00 pack-year smoking history. He has never used smokeless tobacco. He reports that he does not drink alcohol or use drugs.    Allergies  Allergen Reactions  . Statins Nausea Only    mylgia  . Atorvastatin Other (See Comments)    Leg weakness  . Nitrofurantoin Monohyd Macro Hives and Rash    Medications Prior to Admission  Medication Sig Dispense Refill  . acetaminophen (TYLENOL) 325 MG tablet Take 2 tablets (650 mg total) by mouth every 4 (four) hours as needed for headache or mild pain. 30 tablet 0  . apixaban (ELIQUIS) 5 MG TABS tablet Take 1 tablet (5 mg total) by mouth 2 (two) times daily. 60 tablet 0  . clopidogrel (PLAVIX) 75 MG tablet Take 1 tablet (75 mg total) by mouth daily with breakfast. 90 tablet 3  . ezetimibe (ZETIA) 10 MG tablet Take 1 tablet (10 mg total) by mouth daily. 30 tablet 0  . fexofenadine (ALLEGRA) 180 MG tablet Take 180 mg by mouth daily.    . furosemide (LASIX) 40 MG tablet TAKE 1/2 TABLET BY MOUTH DAILY 45 tablet 2  . insulin detemir (LEVEMIR) 100 UNIT/ML injection Inject 0.35 mLs (35 Units total) into the skin daily with supper. 10 mL 11  . levETIRAcetam (KEPPRA) 500 MG tablet Take 1 tablet (500 mg total) by mouth 2 (two) times daily. 60 tablet 0  . lisinopril (PRINIVIL,ZESTRIL) 10 MG tablet Take 1 tablet (10 mg total) by mouth daily. 30 tablet 0  . nitroGLYCERIN (NITROSTAT) 0.4 MG SL tablet PLACE 1 TABLET UNDER THE TONGUE EVERY 5 MINUTES AS NEEDED FOR CHEST PAIN 75 tablet 0  . pantoprazole (PROTONIX) 40 MG tablet Take 40 mg by mouth daily.       . potassium chloride SA (K-DUR,KLOR-CON) 20 MEQ tablet Take 1 tablet (20 mEq total) by mouth daily. 90 tablet 3  . sertraline (ZOLOFT) 100 MG tablet Take 1.5 tablets (150 mg total) by mouth daily. (Patient taking differently: Take 100 mg by mouth daily. ) 90 tablet 3    Home: Home Living Family/patient expects to be discharged to:: Private residence Living Arrangements: Spouse/significant other Available Help at Discharge: Family, Personal care attendant(Pt has hired caregiver 12 hours a day) Type of Home: [place]: One level Bathroom Shower/Tub: Multimedia programmer: Handicapped height(bars on the toilet) Home Equipment: Environmental consultant - 2 wheels, Shower seat, Grab bars - toilet Additional Comments: unsure of home environment as patient is a poor historian with no family present   Lives  With: Spouse  Functional History: Prior Function Level of Independence: Needs assistance Gait / Transfers Assistance Needed: ambulates with RW/rollator with min assist ADL's / Homemaking Assistance Needed: Has assistance from aide for 12 hours a day.  She gets him up and then helps him with all ADLs and for transfers until she puts him to bed in the evening.   Functional Status:  Mobility: Bed Mobility Overal bed mobility: Needs Assistance Bed Mobility: Supine to Sit Supine to sit: Max assist General bed mobility comments: Max A for LE management; able to initiate bringing LE towards EOB but with physical assist to complete and for upright trunk control with use of bed pad for positioning Transfers Overall transfer level: Needs assistance Equipment used: Rolling walker (2 wheeled) Transfers: Sit to/from Stand, [employer] Transfers Sit to Stand: Min assist Stand pivot transfers: Min assist General transfer comment: max cueing for stepping pattern and safety; Mod A +2 to power up at bedside; Mod A for RW management      ADL: ADL Overall ADL's : Needs  assistance/impaired Eating/Feeding: Minimal assistance Eating/Feeding Details (indicate cue type and reason): To drink from cup Grooming: Wash/dry hands, Wash/dry face, Minimal assistance Upper Body Bathing: Moderate assistance, Sitting Lower Body Bathing: Maximal assistance, Sit to/from stand Upper Body Dressing : Maximal assistance, Sitting Lower Body Dressing: Total assistance Toilet Transfer: Minimal assistance, Stand-pivot, RW Toileting- Clothing Manipulation and Hygiene: Moderate assistance Functional mobility during ADLs: Minimal assistance, Rolling walker General ADL Comments: Pt able to complete sit to stand from the bedside chair with min assist and then ambulate a few small steps forward and backwards with min facilitation.  Prior to admission pt had caregiver for 12 hours a day to assist with ADL function.  Discussed importance for aide to allow pt to participate in as much of ADL  tasks as possible and not just complete them for him.  They also agree that this would be most beneficial and that pt has not been doing this.    Cognition: Cognition Overall Cognitive Status: History of cognitive impairments - at baseline Arousal/Alertness: Awake/alert Orientation Level: Oriented to person Attention: Sustained, Focused Focused Attention: Appears intact Sustained Attention: Impaired Sustained Attention Impairment: Verbal basic, Functional basic Memory: Impaired Memory Impairment: Storage deficit, Decreased recall of new information, Decreased short term memory Decreased Short Term Memory: Verbal basic, Functional basic Awareness: Impaired Awareness Impairment: Intellectual impairment, Emergent impairment, Anticipatory impairment Problem Solving: Impaired Problem Solving Impairment: Verbal basic, Functional basic Executive Function: Reasoning, Organizing, Initiating, Sequencing Reasoning: Impaired Reasoning Impairment: Verbal basic, Functional basic Sequencing:  Impaired Sequencing Impairment: Verbal basic, Functional basic Organizing: Impaired Organizing Impairment: Verbal basic, Functional basic Initiating: Impaired Initiating Impairment: Verbal basic, Functional basic Safety/Judgment: Impaired Cognition Arousal/Alertness: Awake/alert Behavior During Therapy: Flat affect Overall Cognitive Status: History of cognitive impairments - at baseline Area of Impairment: Orientation, Memory, Problem solving, Awareness, Safety/judgement Orientation Level: Disoriented to, Place, Time, Situation Current Attention Level: Sustained Following Commands: Follows one step commands with increased time Safety/Judgement: Decreased awareness of safety Problem Solving: Slow processing, Decreased initiation, Requires verbal cues, Requires tactile cues General Comments: Pt with delayed initiation to verbal questioning.   Blood pressure (!) 142/77, pulse (!) 58, temperature 98 F (36.7 C), temperature source Oral, resp. rate 17, height 5' 5.98" (1.676 m), weight 74.4 kg (164 lb 0.4 oz), SpO2 97 %. Physical Exam  Constitutional: No distress.  HENT:  Head: Normocephalic.  Eyes: Pupils are equal, round, and reactive to light.  Neck: Normal range of motion.  Cardiovascular: Normal rate.  Respiratory: Effort normal.  GI: Soft.  Neurological:  Alert, distracted. Attempting to get out of bed. Left central 7. LUE 3/5, LLE 3-4/5. RUE and RLE seem to be at least 4 to 4+/5. Right gaze preference  Skin:  Few scattered abrasions  Psychiatric:  Restless, cooperative    Results for orders placed or performed during the hospital encounter of 07/26/17 (from the past 24 hour(s))  Ethanol     Status: None   Collection Time: 07/26/17  5:00 PM  Result Value Ref Range   Alcohol, Ethyl (B) <10 <10 mg/dL  Protime-INR     Status: Abnormal   Collection Time: 07/26/17  5:00 PM  Result Value Ref Range   Prothrombin Time 17.6 (H) 11.4 - 15.2 seconds   INR 1.46   APTT      Status: Abnormal   Collection Time: 07/26/17  5:00 PM  Result Value Ref Range   aPTT 43 (H) 24 - 36 seconds  CBC     Status: Abnormal   Collection Time: 07/26/17  5:00 PM  Result Value Ref Range   WBC 8.5 4.0 - 10.5 K/uL   RBC 4.71 4.22 - 5.81 MIL/uL   Hemoglobin 12.7 (L) 13.0 - 17.0 g/dL   HCT 38.8 (L) 39.0 - 52.0 %   MCV 82.4 78.0 - 100.0 fL   MCH 27.0 26.0 - 34.0 pg   MCHC 32.7 30.0 - 36.0 g/dL   RDW 14.1 11.5 - 15.5 %   Platelets 178 150 - 400 K/uL  Differential     Status: None   Collection Time: 07/26/17  5:00 PM  Result Value Ref Range   Neutrophils Relative % 73 %   Neutro Abs 6.3 1.7 - 7.7 K/uL   Lymphocytes Relative 16 %   Lymphs Abs 1.4 0.7 - 4.0 K/uL   Monocytes Relative 6 %   Monocytes Absolute 0.5 0.1 - 1.0 K/uL   Eosinophils Relative 4 %   Eosinophils Absolute 0.3 0.0 - 0.7 K/uL   Basophils Relative 1 %   Basophils Absolute 0.1 0.0 - 0.1 K/uL   Immature Granulocytes 0 %   Abs Immature Granulocytes 0.0 0.0 - 0.1 K/uL  Comprehensive metabolic panel     Status: Abnormal   Collection Time: 07/26/17  5:00 PM  Result Value Ref Range   Sodium 141 135 - 145 mmol/L   Potassium 4.0 3.5 - 5.1 mmol/L   Chloride 108 98 - 111 mmol/L   CO2 24 22 - 32 mmol/L   Glucose, Bld 139 (H) 70 - 99 mg/dL   BUN 23 8 - 23 mg/dL   Creatinine, Ser 1.50 (H) 0.61 - 1.24 mg/dL   Calcium 9.0 8.9 - 10.3 mg/dL   Total Protein 5.8 (L) 6.5 - 8.1 g/dL   Albumin 3.3 (L) 3.5 - 5.0 g/dL   AST 18 15 - 41 U/L   ALT 12 0 - 44 U/L   Alkaline Phosphatase 67 38 - 126 U/L   Total Bilirubin 0.7 0.3 - 1.2 mg/dL   GFR calc non Af Amer 44 (L) >60 mL/min   GFR calc Af Amer 52 (L) >60 mL/min   Anion gap 9 5 - 15  CK     Status: Abnormal   Collection Time: 07/26/17  5:00 PM  Result Value Ref Range   Total CK 36 (L) 49 - 397 U/L  I-stat troponin, ED (not at [facility], [facility])     Status: None   Collection Time: 07/26/17  5:05 PM  Result Value Ref Range   Troponin i, poc 0.01 0.00 - 0.08 ng/mL   Comment 3           Urine rapid drug screen (hosp performed)not at [facility]     Status: Abnormal   Collection Time: 07/26/17  7:00 PM  Result Value Ref Range   Opiates NONE DETECTED NONE DETECTED   Cocaine NONE DETECTED NONE DETECTED   Benzodiazepines NONE DETECTED NONE DETECTED   Amphetamines NONE DETECTED NONE DETECTED   Tetrahydrocannabinol NONE DETECTED NONE DETECTED   Barbiturates (A) NONE DETECTED    Result not available. Reagent lot number recalled by manufacturer.  Urinalysis, Routine w reflex microscopic (not at [facility])     Status: Abnormal   Collection Time: 07/26/17  7:01 PM  Result Value Ref Range   Color, Urine YELLOW YELLOW   APPearance CLEAR CLEAR   Specific Gravity, Urine 1.013 1.005 - 1.030   pH 5.0 5.0 - 8.0   Glucose, UA NEGATIVE NEGATIVE mg/dL   Hgb urine dipstick NEGATIVE NEGATIVE   Bilirubin Urine NEGATIVE NEGATIVE   Ketones, ur NEGATIVE NEGATIVE mg/dL   Protein, ur 30 (A) NEGATIVE mg/dL   Nitrite NEGATIVE NEGATIVE   Leukocytes, UA NEGATIVE NEGATIVE   RBC / HPF 0-5 0 - 5 RBC/hpf   WBC, UA 0-5 0 - 5 WBC/hpf   Bacteria, UA RARE (A) NONE SEEN   Mucus PRESENT    Hyaline Casts, UA PRESENT   Glucose, capillary     Status: Abnormal   Collection Time: 07/27/17 12:41 AM  Result Value Ref Range   Glucose-Capillary 118 (H) 70 - 99 mg/dL  Glucose, capillary     Status: Abnormal   Collection Time: 07/27/17  4:03 AM  Result Value Ref Range   Glucose-Capillary 127 (H) 70 - 99 mg/dL  Basic metabolic panel     Status: Abnormal   Collection Time: 07/27/17  4:10 AM  Result Value Ref Range   Sodium 143 135 - 145 mmol/L   Potassium 3.7 3.5 - 5.1 mmol/L   Chloride 109 98 - 111 mmol/L   CO2 26 22 - 32 mmol/L   Glucose, Bld 132 (H) 70 - 99 mg/dL   BUN 20 8 - 23 mg/dL   Creatinine, Ser 1.28 (H) 0.61 - 1.24 mg/dL   Calcium 9.1 8.9 - 10.3 mg/dL   GFR calc non Af Amer 54 (L) >60 mL/min   GFR calc Af Amer >60 >60 mL/min   Anion gap 8 5 - 15  TSH     Status: None   Collection Time: 07/27/17   4:10 AM  Result Value Ref Range   TSH 1.819 0.350 - 4.500 uIU/mL  Vitamin B12     Status: None   Collection Time: 07/27/17  4:10 AM  Result Value Ref Range   Vitamin B-12 458 180 - 914 pg/mL  Glucose, capillary     Status: Abnormal   Collection Time: 07/27/17  8:09 AM  Result Value Ref Range   Glucose-Capillary 117 (H) 70 - 99 mg/dL  Glucose, capillary     Status: Abnormal   Collection Time: 07/27/17 11:37 AM  Result Value Ref Range   Glucose-Capillary 124 (H) 70 - 99 mg/dL   Ct Head Wo Contrast  Result Date: 07/26/2017 CLINICAL DATA:  Increasing weakness over the past few days. Unable to walk due to weakness. EXAM: CT HEAD WITHOUT CONTRAST TECHNIQUE: Contiguous axial images were obtained from the base of the skull through the vertex without  intravenous contrast. COMPARISON:  CT head 06/04/2017. MR head 06/04/2017. FINDINGS: Brain: No definite acute stroke, acute hemorrhage, mass lesion, hydrocephalus, or extra-axial fluid. Advanced cerebral and cerebellar atrophy. Hypoattenuation of white matter, likely small vessel disease. Evolutionary change of the previously noted acute infarction to subacute/chronic appearance in the RIGHT frontal deep white matter and LEFT internal capsular regions. Continued hypodensity of the brainstem representing chronic ischemia. Vascular: Calcification of the cavernous internal carotid arteries consistent with cerebrovascular atherosclerotic disease. No signs of intracranial large vessel occlusion. Skull: Calvarium intact. Sinuses/Orbits: Clear sinuses. Other: None. IMPRESSION: Advanced atrophy and small vessel disease. No definite acute intracranial findings. Electronically Signed   By: Staci Righter M.D.   On: 07/26/2017 18:07   Mr Brain Wo Contrast  Result Date: 07/26/2017 CLINICAL DATA:  72 y/o M; history of multiple strokes. Patient presents with 2 days of generalized weakness and gait difficulty. EXAM: MRI HEAD WITHOUT CONTRAST TECHNIQUE: Multiplanar,  multiecho pulse sequences of the brain and surrounding structures were obtained without intravenous contrast. COMPARISON:  07/26/2017 CT head.  06/04/2017 MRI head. FINDINGS: Brain: Subcentimeter foci of reduced diffusion are present within the left posteromedial frontal lobe, left parietal white matter, and the right putamen compatible with acute/early subacute infarction. No associated hemorrhage or mass effect. Small chronic infarcts are present within the right cerebellar hemisphere, pons, bilateral thalami, bilateral lentiform nuclei, and bilateral caudate heads. Advanced chronic microvascular ischemic changes of white matter parenchymal volume loss of the brain. No extra-axial collection, hydrocephalus, or herniation. Small foci of susceptibility hypointensity are present scattered throughout white matter as well as in the left putamen and right medulla compatible hemosiderin deposition of chronic microhemorrhage. Vascular: Normal flow voids. Skull and upper cervical spine: Normal marrow signal. Sinuses/Orbits: Negative. Other: None. IMPRESSION: 1. Three subcentimeter foci of acute/early subacute infarction in left frontal and parietal lobes as well as the right putamen. No hemorrhage or mass effect. 2. Advanced chronic microvascular ischemic changes, volume loss, and numerous small chronic infarcts of the brain. These results will be called to the ordering clinician or representative by the Radiologist Assistant, and communication documented in the PACS or zVision Dashboard. Electronically Signed   By: Kristine Garbe M.D.   On: 07/26/2017 23:05     Assessment/Plan: Diagnosis: 72 yo male with history of CVA's and multiple medical issues now s/p left frontal and right putamen infarcts 1. Does the need for close, 24 hr/day medical supervision in concert with the patient's rehab needs make it unreasonable for this patient to be served in a less intensive setting? No 2. Co-Morbidities requiring  supervision/potential complications: chf, ckd 3. Due to bladder management, bowel management, safety, skin/wound care, disease management, medication administration, pain management and patient education, does the patient require 24 hr/day rehab nursing? No 4. Does the patient require coordinated care of a physician, rehab nurse, PT, OT, SLP to address physical and functional deficits in the context of the above medical diagnosis(es)? No Addressing deficits in the following areas: balance, endurance, strength, transferring, bowel/bladder control, bathing and feeding 5. Can the patient actively participate in an intensive therapy program of at least 3 hrs of therapy per day at least 5 days per week? No 6. The potential for patient to make measurable gains while on inpatient rehab is poor 7. Anticipated functional outcomes upon discharge from inpatient rehab are n/a  with PT, n/a with OT, n/a with SLP. 8. Estimated rehab length of stay to reach the above functional goals is: n/a 9. Anticipated D/C setting: Home  10. Anticipated post D/C treatments: N/A 11. Overall Rehab/Functional Prognosis: fair  RECOMMENDATIONS: This patient's condition is appropriate for continued rehabilitative care in the following setting: SNF Patient has agreed to participate in recommended program. Potentially Note that insurance prior authorization may be required for reimbursement for recommended care.  Comment: Spoke with spouse at length. Pt just returned home from stay at [facility]. has been declining for some time. Was requiring substantial assistance at home prior to this admission and was struggling to tolerate HH therapies. Wife is frail and substantial medical issues of her own.   Meredith Staggers, MD, [place] [facility] 07/27/2017    Bary Leriche, PA-C 07/27/2017

## 2017-07-27 NOTE — Evaluation (Signed)
Occupational Therapy Evaluation Patient Details Name: Carl Stephenson MRN: [id number] DOB: April 04, 1945 Today's Date: 07/27/2017    History of Present Illness Patient is a 72 y/o male presenting with generalized weakness and gait difficulty. Noncontrast head CT is negative for definite acute finding. PMH significant for chronic combined systolic/diastolic CHF, insulin-dependent diabetes mellitus, chronic kidney disease stage III, and history of multiple strokes.  Continued neuro work-up.   Clinical Impression   Pt currently presents at a mod to max assist level for selfcare tasks sit to stand as well as toileting.  Min assist for sit to stand and stand pivot transfers with the RW for support.  Feel he will benefit from acute care OT to further progress back to a min to mod assist level for discharge home.  Based on daughters report, pt has 12 hours of assistance per day from the time he gets up until he goes to bed.  Feel he will benefit from short CIR stay for increased therapy intensity in order to continue progression of ADLs before discharging home.      Follow Up Recommendations  CIR    Equipment Recommendations  Other (comment)(TBD next venue of care)       Precautions / Restrictions Precautions Precautions: Fall Restrictions Weight Bearing Restrictions: No      Mobility Bed Mobility Overal bed mobility: Needs Assistance Bed Mobility: Supine to Sit     Supine to sit: Max assist     General bed mobility comments: Max A for LE management; able to initiate bringing LE towards EOB but with physical assist to complete and for upright trunk control with use of bed pad for positioning  Transfers Overall transfer level: Needs assistance Equipment used: Rolling walker (2 wheeled) Transfers: Sit to/from [name] Sit to Stand: Min assist Stand pivot transfers: Min assist       General transfer comment: max cueing for stepping pattern and safety; Mod A +2 to  power up at bedside; Mod A for RW management    Balance Overall balance assessment: Needs assistance Sitting-balance support: No upper extremity supported Sitting balance-[name]: Poor Sitting balance - Comments: Pt with increased LOB posteriorly when sitting unsupported, with LEs staying off of the floor.   Standing balance support: Bilateral upper extremity supported;During functional activity Standing balance-[name]: Poor Standing balance comment: Pt needed use of the walker to maintain standing balance and for functional mobility.                           ADL either performed or assessed with clinical judgement   ADL Overall ADL's : Needs assistance/impaired Eating/Feeding: Minimal assistance Eating/Feeding Details (indicate cue type and reason): To drink from cup Grooming: Wash/dry hands;Wash/dry face;Minimal assistance   Upper Body Bathing: Moderate assistance;Sitting   Lower Body Bathing: Maximal assistance;Sit to/from stand   Upper Body Dressing : Maximal assistance;Sitting   Lower Body Dressing: Total assistance   Toilet Transfer: Minimal assistance;Stand-pivot;RW   Toileting- Clothing Manipulation and Hygiene: Moderate assistance       Functional mobility during ADLs: Minimal assistance;Rolling walker General ADL Comments: Pt able to complete sit to stand from the bedside chair with min assist and then ambulate a few small steps forward and backwards with min facilitation.  Prior to admission pt had caregiver for 12 hours a day to assist with ADL function.  Discussed importance for aide to allow pt to participate in as much of ADL  tasks as possible  and not just complete them for him.  They also agree that this would be most beneficial and that pt has not been doing this.       Vision Patient Visual Report: No change from baseline Vision Assessment?: Yes Eye Alignment: Within Functional Limits Ocular Range of Motion: Within Functional  Limits Alignment/Gaze Preference: Within Defined Limits Tracking/Visual Pursuits: Decreased smoothness of horizontal tracking;Decreased smoothness of vertical tracking(jerky and delayed tracking overall, not consistent area noted) Visual Fields: Left visual field deficit(Pt with decrease ability identifying therapists hand in the far lateral left visual field.)            Pertinent Vitals/Pain Pain Assessment: No/denies pain     Hand Dominance Right   Extremity/Trunk Assessment Upper Extremity Assessment Upper Extremity Assessment: LUE deficits/detail;RUE deficits/detail RUE Deficits / Details: AROM grossly WFLS for selfcare tasks, strenght overall 3+/5 throughout.  Decreased FM and cross motor coordinatino and speed noted with finger to nose testing and when holding cup to drink from LUE Deficits / Details: History of left hemiparesis currently Brunnstrum stage V in the arm and hand.  Gross shoulder flexion with slight synergy noted 0-60 degrees, AAROM WFLS for continued shoulder flexion.  Gross grasp present but overall strength in the hand.  Elbow flexion 3/5 as well   Lower Extremity Assessment Lower Extremity Assessment: Generalized weakness   Cervical / Trunk Assessment Cervical / Trunk Assessment: Kyphotic   Communication Communication Communication: Other (comment)(Delayed initiation at times)   Cognition Arousal/Alertness: Awake/alert Behavior During Therapy: Flat affect Overall Cognitive Status: History of cognitive impairments - at baseline Area of Impairment: Orientation;Memory;Problem solving;Awareness;Safety/judgement                 Orientation Level: Disoriented to;Place;Time;Situation Current Attention Level: Sustained   Following Commands: Follows one step commands with increased time Safety/Judgement: Decreased awareness of safety   Problem Solving: Slow processing;Decreased initiation;Requires verbal cues;Requires tactile cues General Comments: Pt  with delayed initiation to verbal questioning.              Home Living Family/patient expects to be discharged to:: Private residence Living Arrangements: Spouse/significant other Available Help at Discharge: Family;Personal care attendant(Pt has hired caregiver 12 hours a day) Type of Home: [place]: One level     Bathroom Shower/Tub: Occupational psychologist: Handicapped height(bars on the toilet)     Home Equipment: Environmental consultant - 2 wheels;Shower seat;Grab bars - toilet   Additional Comments: unsure of home environment as patient is a poor historian with no family present   Lives With: Spouse    Prior Functioning/Environment Level of Independence: Needs assistance  Gait / Transfers Assistance Needed: ambulates with RW/rollator with min assist ADL's / Homemaking Assistance Needed: Has assistance from aide for 12 hours a day.  She gets him up and then helps him with all ADLs and for transfers until she puts him to bed in the evening.              OT Problem List: Decreased strength;Decreased range of motion;Impaired balance (sitting and/or standing);Decreased activity tolerance;Decreased cognition;Decreased knowledge of use of DME or AE;Impaired UE functional use;Decreased coordination;Decreased safety awareness      OT Treatment/Interventions: Self-care/ADL training;Therapeutic exercise;Neuromuscular education;DME and/or AE instruction;Manual therapy;Therapeutic activities;Balance training;Patient/family education    OT Goals(Current goals can be found in the care plan section) Acute Rehab OT Goals Patient Stated Goal: Pt did not state but agreeable to participate in session OT Goal Formulation: With  patient/family Time For Goal Achievement: 08/10/17 Potential to Achieve Goals: Good  OT Frequency: Min 2X/week              AM-PAC PT "6 Clicks" Daily Activity     Outcome Measure Help from another person eating meals?: A Little Help from  another person taking care of personal grooming?: A Little Help from another person toileting, which includes using toliet, bedpan, or urinal?: A Lot Help from another person bathing (including washing, rinsing, drying)?: A Lot Help from another person to put on and taking off regular upper body clothing?: A Lot Help from another person to put on and taking off regular lower body clothing?: Total 6 Click Score: 13   End of Session Equipment Utilized During Treatment: Rolling walker Nurse Communication: Mobility status  Activity Tolerance: Patient tolerated treatment well Patient left: in chair;with call bell/phone within reach;with family/visitor present  OT Visit Diagnosis: Unsteadiness on feet (R26.81);Muscle weakness (generalized) (M62.81);Hemiplegia and hemiparesis Hemiplegia - Right/Left: Left Hemiplegia - dominant/non-dominant: Non-Dominant Hemiplegia - caused by: Cerebral infarction                Time: 1007-1040 OT Time Calculation (min): 33 min Charges:  OT General Charges $OT Visit: 1 Visit OT Evaluation $OT Eval Moderate Complexity: 1 Mod OT Treatments $Self Care/Home Management : 8-22 mins G-Codes: OT G-codes **NOT FOR INPATIENT CLASS** Functional Assessment Tool Used: Clinical judgement Functional Limitation: Self care Self Care Current Status (N1833): At least 60 percent but less than 80 percent impaired, limited or restricted Self Care Goal Status (P8251): At least 20 percent but less than 40 percent impaired, limited or restricted     Alivia Cimino OTR/L 07/27/2017, 12:48 PM

## 2017-07-27 NOTE — Progress Notes (Signed)
PROGRESS NOTE    Carl Stephenson  OVF:[id number] DOB: [birth year] DOA: 07/26/2017 PCP: Myer Peer, MD     Brief Narrative:  72 y.o. male with medical history significant for chronic combined systolic/diastolic CHF, insulin-dependent diabetes mellitus, chronic kidney disease stage III, and history of multiple strokes, and presenting to the emergency department with 2 days of generalized weakness with gait difficulty. Patient was admitted to the hospital in May with similar symptoms, new areas of ischemia were noted on MRI brain despite dual antiplatelet therapy. He was discharged home on Eliquis at that time and had been fairly stable until developing a poor appetite and generalized weakness with a past 2 days. He is typically able to ambulate with a walker, but has been unable to do this for the past 2 days. There has not been any significant cough, no diarrhea or vomiting, and the patient has not complained of anything.  Patient found with 3 new small infarcts on repeat MRI this time; after discussing with neurology who are on board of the case, will continue Elequis and Plavix, will give a trial of a low-dose statins and will check CT chest abdomen and pelvis to try to rule out any underlying malignancy.   Assessment & Plan: 1-acute/subacute subcentimeter infarction in left frontal and parietal lobes and also in right putamen: -Patient chronically on Eliquis and Plavix prior to this admission. -No hemorrhage or mass effect appreciated. -Neurology consulted -Very recent extensive work-up including echo, vascular patency and loop recorder -Following recommendations by neurology will check CT chest, abdomen and pelvis in order to try to screen any underlying malignancy that could be responsible of increased viscosity and embolic source. -Patient cholesterol continues to be out of goal, will start low-dose Pravachol and Lovaza -A1c 5.8 recently check. -Physical therapy recommending CIR;  inpatient rehabilitation service has been consulted and will follow recommendations.  2-Insulin-requiring or dependent type II diabetes mellitus ([place]) -last A1C 5.8 -will continue SSI and long acting while inpatient -monitor for low CBG's  3-Coronary artery disease involving native coronary artery of native heart without angina pectoris -stable and denying CP -continue plavix.  4-HTN -allowing permissive hypertension -continue holding lisinopril and lasix  5-acute on chronic renal failure: stage 3 at baseline  -will continue holding nephrotoxic agents -patient Cr improved with gentle IVF's -follow Cr trend   6-hx of seizure  -no sizure activity appreciated -will continue keppra.  7-GERD -continue PPI  8-depression -continue Zoloft -no SI or hallucinations.   DVT prophylaxis: Chronically on Eliquis. Code Status: Full code Family Communication: Daughter over the phone. Disposition Plan: Remains inpatient, complete work-up as recommended by neurology service and follow CIR recommendation for potential inpatient rehabilitation prior to discharge.  Continue Eliquis and Plavix for secondary prevention.  Consultants:   Neurology  CIR  Procedures:   See below for x-ray reports.  Antimicrobials:  Anti-infectives (From admission, onward)   None      Subjective: Oriented x1, no chest pain, no shortness of breath.  Patient is slow to respond and is still generally weak.  Objective: Vitals:   07/27/17 0140 07/27/17 0340 07/27/17 0540 07/27/17 0723  BP: (!) 159/58 (!) 144/60 (!) 159/67 (!) 173/62  Pulse: (!) 52 66 66   Resp: 12 17 (!) 23   Temp:    98 F (36.7 C)  TempSrc:    Oral  SpO2: 96% 95% 95%   Weight:      Height:        Intake/Output Summary (Last  24 hours) at 07/27/2017 0920 Last data filed at 07/27/2017 0548 Gross per 24 hour  Intake 320 ml  Output 150 ml  Net 170 ml   Filed Weights   07/26/17 2110 07/27/17 0132  Weight: 74.4 kg (164 lb 0.4  oz) 74.4 kg (164 lb 0.4 oz)    Examination: General exam: Alert, awake, oriented x 1; patient able to follow commands and reports no acute distress.  He is slow to respond; denies chest pain, shortness of breath, nausea, vomiting and abdominal pain. Respiratory system: Clear to auscultation. Respiratory effort normal. Cardiovascular system:S1 and S2. No rubs or gallops.  No JVD. Gastrointestinal system: Abdomen is nondistended, soft and nontender. No organomegaly or masses felt. Normal bowel sounds heard. Central nervous system: Alert and oriented x 1.  No pronation drift, generally weak (but L > R); positive dysmetria, muscle strength 3 out of 5. Extremities: No cyanosis or clubbing, trace edema appreciated bilaterally. Skin: No rashes, lesions or ulcers Psychiatry: No agitation, no hallucinations, oriented x1.   Data Reviewed: I have personally reviewed following labs and imaging studies  CBC: Recent Labs  Lab 07/26/17 1700  WBC 8.5  NEUTROABS 6.3  HGB 12.7*  HCT 38.8*  MCV 82.4  PLT 638   Basic Metabolic Panel: Recent Labs  Lab 07/26/17 1700 07/27/17 0410  NA 141 143  K 4.0 3.7  CL 108 109  CO2 24 26  GLUCOSE 139* 132*  BUN 23 20  CREATININE 1.50* 1.28*  CALCIUM 9.0 9.1   GFR: Estimated Creatinine Clearance: 46.4 mL/min (A) (by C-G formula based on SCr of 1.28 mg/dL (H)).   Liver Function Tests: Recent Labs  Lab 07/26/17 1700  AST 18  ALT 12  ALKPHOS 67  BILITOT 0.7  PROT 5.8*  ALBUMIN 3.3*   Coagulation Profile: Recent Labs  Lab 07/26/17 1700  INR 1.46   Cardiac Enzymes: Recent Labs  Lab 07/26/17 1700  CKTOTAL 36*   CBG: Recent Labs  Lab 07/27/17 0041 07/27/17 0403 07/27/17 0809  GLUCAP 118* 127* 117*   Thyroid Function Tests: Recent Labs    07/27/17 0410  TSH 1.819   Anemia Panel: Recent Labs    07/27/17 0410  VITAMINB12 458   Urine analysis:    Component Value Date/Time   COLORURINE YELLOW 07/26/2017 1901   APPEARANCEUR  CLEAR 07/26/2017 1901   LABSPEC 1.013 07/26/2017 1901   PHURINE 5.0 07/26/2017 1901   GLUCOSEU NEGATIVE 07/26/2017 1901   HGBUR NEGATIVE 07/26/2017 1901   BILIRUBINUR NEGATIVE 07/26/2017 1901   KETONESUR NEGATIVE 07/26/2017 1901   PROTEINUR 30 (A) 07/26/2017 1901   UROBILINOGEN 0.2 12/29/2013 0040   NITRITE NEGATIVE 07/26/2017 1901   LEUKOCYTESUR NEGATIVE 07/26/2017 1901    Radiology Studies: Ct Head Wo Contrast  Result Date: 07/26/2017 CLINICAL DATA:  Increasing weakness over the past few days. Unable to walk due to weakness. EXAM: CT HEAD WITHOUT CONTRAST TECHNIQUE: Contiguous axial images were obtained from the base of the skull through the vertex without intravenous contrast. COMPARISON:  CT head 06/04/2017. MR head 06/04/2017. FINDINGS: Brain: No definite acute stroke, acute hemorrhage, mass lesion, hydrocephalus, or extra-axial fluid. Advanced cerebral and cerebellar atrophy. Hypoattenuation of white matter, likely small vessel disease. Evolutionary change of the previously noted acute infarction to subacute/chronic appearance in the RIGHT frontal deep white matter and LEFT internal capsular regions. Continued hypodensity of the brainstem representing chronic ischemia. Vascular: Calcification of the cavernous internal carotid arteries consistent with cerebrovascular atherosclerotic disease. No signs of intracranial large vessel  occlusion. Skull: Calvarium intact. Sinuses/Orbits: Clear sinuses. Other: None. IMPRESSION: Advanced atrophy and small vessel disease. No definite acute intracranial findings. Electronically Signed   By: Staci Righter M.D.   On: 07/26/2017 18:07   Mr Brain Wo Contrast  Result Date: 07/26/2017 CLINICAL DATA:  72 y/o M; history of multiple strokes. Patient presents with 2 days of generalized weakness and gait difficulty. EXAM: MRI HEAD WITHOUT CONTRAST TECHNIQUE: Multiplanar, multiecho pulse sequences of the brain and surrounding structures were obtained without  intravenous contrast. COMPARISON:  07/26/2017 CT head.  06/04/2017 MRI head. FINDINGS: Brain: Subcentimeter foci of reduced diffusion are present within the left posteromedial frontal lobe, left parietal white matter, and the right putamen compatible with acute/early subacute infarction. No associated hemorrhage or mass effect. Small chronic infarcts are present within the right cerebellar hemisphere, pons, bilateral thalami, bilateral lentiform nuclei, and bilateral caudate heads. Advanced chronic microvascular ischemic changes of white matter parenchymal volume loss of the brain. No extra-axial collection, hydrocephalus, or herniation. Small foci of susceptibility hypointensity are present scattered throughout white matter as well as in the left putamen and right medulla compatible hemosiderin deposition of chronic microhemorrhage. Vascular: Normal flow voids. Skull and upper cervical spine: Normal marrow signal. Sinuses/Orbits: Negative. Other: None. IMPRESSION: 1. Three subcentimeter foci of acute/early subacute infarction in left frontal and parietal lobes as well as the right putamen. No hemorrhage or mass effect. 2. Advanced chronic microvascular ischemic changes, volume loss, and numerous small chronic infarcts of the brain. These results will be called to the ordering clinician or representative by the Radiologist Assistant, and communication documented in the PACS or zVision Dashboard. Electronically Signed   By: Kristine Garbe M.D.   On: 07/26/2017 23:05    Scheduled Meds: . apixaban  5 mg Oral BID  . clopidogrel  75 mg Oral Q breakfast  . ezetimibe  10 mg Oral Daily  . insulin aspart  0-9 Units Subcutaneous Q4H  . levETIRAcetam  500 mg Oral BID  . omega-3 acid ethyl esters  1 g Oral BID  . pantoprazole  40 mg Oral Daily  . pravastatin  20 mg Oral q1800  . sertraline  100 mg Oral Daily   Continuous Infusions:   LOS: 0 days    Time spent: 30 minutes    Barton Dubois,  MD [facility] Pager [phone]  If 7PM-7AM, please contact night-coverage [email or web address] Password [email or web address] 07/27/2017, 9:20 AM

## 2017-07-27 NOTE — Progress Notes (Signed)
Asked pt to swallow a sip of water. Pt swallowed and immediately coughed. Pt given pills in applesauce due to risk of aspiration until SLP consult can be completed.

## 2017-07-27 NOTE — Progress Notes (Signed)
Inpatient Rehabilitation Admissions Coordinator  Patient is not a candidate for an inpt rehab admit. Dr. Naaman Plummer is recommending SNF rehab. I have notified RN CM, Jackelyn Poling and SW, Judson Roch. We will sign off at this time.  Danne Baxter, RN, MSN Rehab Admissions Coordinator [phone] 07/27/2017 4:02 PM

## 2017-07-27 NOTE — Clinical Social Work Note (Signed)
CIR is recommending SNF. Patient was just switched to inpatient status. He would need to meet medical necessity for 3 midnights under inpatient status in order for Medicare to cover SNF placement. Patient was at [facility] from 5/27-6/27. Patient is in his copay days but has a secondary insurance that should cover it. Will follow patient's progress and present SNF option if it appears patient will meet 3-midnight inpatient requirement.  Dayton Scrape, [place]

## 2017-07-27 NOTE — Evaluation (Signed)
Clinical/Bedside Swallow Evaluation Patient Details  Name: Carl Stephenson MRN: [id number] Date of Birth: November 03, 1945  Today's Date: 07/27/2017 Time: SLP Start Time (ACUTE ONLY): 0815 SLP Stop Time (ACUTE ONLY): 0825 SLP Time Calculation (min) (ACUTE ONLY): 10 min  Past Medical History:  Past Medical History:  Diagnosis Date  . Acute encephalopathy admitted 12/28/2013  . Bladder tumor    Recurrent  . Bradycardia 01/16/2016  . Cancer [facility])    "had TURBT; cancer free since ~ 11/2013/MD" (12/27/2013)  . Carotid artery disease ([place])    [place] 12/15: Bilateral ICA 1-39 // [place] 1/18: Bilateral ICA 1-39  . Chronic lower back pain   . Coronary artery disease    a. Cath 12/27/2013 EF 35-40%, 80-90% prox LAD treated with balloon angioplasty and 3.0x28 mm Xience DES, LCx with anomalous origin, 50-60% stenosis in OM, 50% stenosis in PDA and PLA // b. [place] 1/18: mLAD stent ok, dLAD 80, RI stent ok, oLCx stent ok, OM1 85, oRCA stent ok, mRA 40, dRCA 40, RPDA 60/60 >> med Rx  . Diabetes mellitus type II   . GERD (gastroesophageal reflux disease)   . History of echocardiogram    Echo 12/15: Mild LVH, EF 45-50, inf HK, Gr 1 diastolic dysfunction  // Echo 1/18: Moderate LVH, EF 45-50, diffuse HK, mild LAE, RA upper limits of normal // TEE 1/18:  EF 45, diffuse HK, frequent PVCs, trivial MR, mild LAE, normal RVSF, no LA or RA clot, + PFO   . History of hiatal hernia   . Hyperlipidemia   . Hypertension   . 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 Kidney stones    "passed them all" (12/27/2013)  . Obesity   . Seizure disorder as sequela of cerebrovascular accident ([place])   . 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 TIA (transient ischemic attack) "several"  . Tobacco user    Quit 1994  . Wide-complex tachycardia ([place])    Past Surgical History:  Past Surgical History:  Procedure Laterality Date  .  CARDIAC CATHETERIZATION     "he's had a couple before today where they just looked around" (12/27/2013)  . CARDIAC CATHETERIZATION N/A 01/15/2016   Procedure: Left Heart Cath and Coronary Angiography;  Surgeon: Peter M Martinique, MD;  Location: [place] CV LAB;  Service: Cardiovascular;  Laterality: N/A;  . CORONARY ANGIOPLASTY WITH STENT PLACEMENT     "he's had 3-4 stents put in before today"   . CORONARY ANGIOPLASTY WITH STENT PLACEMENT  12/27/2013   "1"  . EP IMPLANTABLE DEVICE N/A 01/20/2016   Procedure: Loop Recorder Insertion;  Surgeon: Evans Lance, MD;  Location: [place] CV LAB;  Service: Cardiovascular;  Laterality: N/A;  . LEFT HEART CATHETERIZATION WITH CORONARY ANGIOGRAM N/A 10/02/2011   Procedure: LEFT HEART CATHETERIZATION WITH CORONARY ANGIOGRAM;  Surgeon: [name] Bow, MD;  Location: [facility] CATH LAB;  Service: Cardiovascular;  Laterality: N/A;  . LEFT HEART CATHETERIZATION WITH CORONARY ANGIOGRAM N/A 12/27/2013   Procedure: LEFT HEART CATHETERIZATION WITH CORONARY ANGIOGRAM;  Surgeon: Blane Ohara, MD;  Location: [facility] CATH LAB;  Service: Cardiovascular;  Laterality: N/A;  . TEE WITHOUT CARDIOVERSION N/A 01/20/2016   Procedure: TRANSESOPHAGEAL ECHOCARDIOGRAM (TEE);  Surgeon: Larey Dresser, MD;  Location: [place];  Service: Cardiovascular;  Laterality: N/A;  . TRANSURETHRAL RESECTION OF BLADDER TUMOR WITH GYRUS (TURBT-GYRUS)  "several times"   HPI:  Carl Stephenson is a 72 y.o. male with medical history significant for chronic combined systolic/diastolic CHF, insulin-dependent diabetes mellitus, chronic kidney disease stage III, and history of multiple strokes (May 2019), and presenting to the emergency department with 2 days of generalized weakness with gait difficulty. Patient was admitted to the hospital in May with similar symptoms, new areas of ischemia were noted on MRI brain despite dual antiplatelet therapy. He was discharged home with wife on Eliquis at that time  and had been fairly stable until developing a poor appetite and generalized weakness with a past 2 days. He is typically able to ambulate with a walker, but has been unable to do this for the past 2 days. There has not been any significant cough, no diarrhea or vomiting, and the patient has not complained of anything.  MRI revealed three subcentimeter foci of acute/early subacute infarction in left frontal and parietal lobes as well as the right putamen. No hemorrhage or mass effect, advanced chronic microvascular ischemic changes, volume loss, and numerous small chronic infarcts of the brain.    Assessment / Plan / Recommendation Clinical Impression  Pt with acute on chronic dysphagia. Pt has history of dysphagia and MBS completed on 06/04/17 revealing mild oropharyngeal dysphagia with need for throat clear and reswallow with thin liquids to decrease risk of aspriation. Additionally, pt continues to present with many of the same chronic symptoms of previous admission - decreased lingual manipulation of bolus, cough with use of straw likely d/t delayed swallow initiation. Daughter present later in morning and states that he has chronic cough with consumption but has not experienced any respiratory decline. Recommend pt continue to consume thin liquids VIA CUP ONLY, with oral care prior to and after PO intake with throat clear and reswallow when sipping thin liquids. Additionally, diet downgraded to dysphagia 2 (diet pt discharged on during previous admission). ST to continue to follow for use of compensatory strategies and possibility of repeat instrumental study should respiratory issues arise. Education provided to daughter and nursing as well as posted in room.  SLP Visit Diagnosis: Dysphagia, oropharyngeal phase (R13.12);Cognitive communication deficit (R41.841)    Aspiration Risk  Mild aspiration risk    Diet Recommendation Dysphagia 2 (Fine chop);Thin liquid   Liquid Administration via:  Cup Medication Administration: Whole meds with puree Supervision: Staff to assist with self feeding;Full supervision/cueing for compensatory strategies Compensations: Slow rate;Small sips/bites;Follow solids with liquid(throat clear, reswallow) Postural Changes: Seated upright at 90 degrees    Other  Recommendations Oral Care Recommendations: Oral care before and after PO   Follow up Recommendations Skilled Nursing facility      Frequency and Duration min 2x/week  2 weeks       Prognosis Prognosis for Safe Diet Advancement: Fair Barriers to Reach Goals: Cognitive deficits      Swallow Study   General Date of Onset: 07/26/17 HPI: Carl Stephenson is a 72 y.o. male with medical history significant for chronic combined systolic/diastolic CHF, insulin-dependent diabetes mellitus, chronic kidney disease stage III, and history of multiple strokes (May 2019), and presenting to the emergency department with 2 days of generalized weakness with gait difficulty. Patient was admitted to the hospital in May with similar symptoms, new areas of ischemia were noted on MRI brain despite dual antiplatelet therapy. He was discharged home with wife on Eliquis at that time and had been fairly stable until developing a poor appetite and generalized weakness with a past 2 days. He is typically able to ambulate  with a walker, but has been unable to do this for the past 2 days. There has not been any significant cough, no diarrhea or vomiting, and the patient has not complained of anything.  MRI revealed three subcentimeter foci of acute/early subacute infarction in left frontal and parietal lobes as well as the right putamen. No hemorrhage or mass effect, advanced chronic microvascular ischemic changes, volume loss, and numerous small chronic infarcts of the brain.  Type of Study: Bedside Swallow Evaluation Previous Swallow Assessment: (MBS 05/2017 - increased risk of aspiration - D2 with thins) Diet Prior to this  Study: Dysphagia 2 (chopped);Thin liquids Temperature Spikes Noted: No Respiratory Status: Room air History of Recent Intubation: No Behavior/Cognition: Alert;Cooperative Oral Cavity Assessment: Within Functional Limits Oral Care Completed by SLP: No Oral Cavity - Dentition: Adequate natural dentition Vision: Functional for self-feeding Self-Feeding Abilities: Needs assist;Needs set up;Total assist Patient Positioning: Upright in bed Baseline Vocal Quality: Low vocal intensity Volitional Cough: Weak Volitional Swallow: Able to elicit    Oral/Motor/Sensory Function Overall Oral Motor/Sensory Function: Mild impairment(appears chronic from previous CVA) Facial ROM: Reduced left;Suspected CN VII (facial) dysfunction Facial Symmetry: Abnormal symmetry left;Suspected CN VII (facial) dysfunction Facial Strength: Reduced left;Suspected CN VII (facial) dysfunction Lingual ROM: Within Functional Limits Lingual Symmetry: Within Functional Limits Lingual Strength: Reduced;Suspected CN XII (hypoglossal) dysfunction   Ice Chips Ice chips: Not tested   Thin Liquid Thin Liquid: Impaired Presentation: Cup;Self Fed;Straw Pharyngeal  Phase Impairments: Suspected delayed Swallow;Cough - Immediate(With straw) Other Comments: (see impressions statement)    Nectar Thick Nectar Thick Liquid: Not tested   Honey Thick Honey Thick Liquid: Not tested   Puree Puree: Impaired Presentation: Self Fed;Spoon Oral Phase Impairments: Reduced lingual movement/coordination;Poor awareness of bolus Oral Phase Functional Implications: Prolonged oral transit   Solid   GO   Solid: Impaired Presentation: Self Fed;Spoon Oral Phase Impairments: Reduced lingual movement/coordination Oral Phase Functional Implications: Prolonged oral transit Other Comments: Dysphagia 2 diet extures assessed        Peter Daquila 07/27/2017,10:22 AM

## 2017-07-27 NOTE — Evaluation (Signed)
Speech Language Pathology Evaluation Patient Details Name: Carl Stephenson MRN: [id number] DOB: April 25, 1945 Today's Date: 07/27/2017 Time: 0102-7253 SLP Time Calculation (min) (ACUTE ONLY): 10 min  Problem List:  Patient Active Problem List   Diagnosis Date Noted  . CKD (chronic kidney disease), stage III ([place]) 07/26/2017  . Weakness 09/07/2016  . Gait abnormality 09/07/2016  . Chronic combined systolic and diastolic CHF (congestive heart failure) ([place]) 02/18/2016  . History of embolic stroke 66/44/0347  . Bradycardia 01/16/2016  . Abnormal EKG   . LBBB (left bundle branch block)   . Coronary artery disease involving native coronary artery of native heart without angina pectoris   . ECG abnormality   . History of CVA (cerebrovascular accident)   . Seizures ([place])   . Seborrheic dermatitis 10/04/2012  . Aphasia 06/26/2011  . 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 BENIGN NEOPLASM OF BLADDER 03/16/2008  . Hyperlipidemia 03/16/2008  . Obesity 03/16/2008  . Essential hypertension 03/16/2008  . UNSPECIFIED PAROXYSMAL TACHYCARDIA 03/16/2008  . GERD 03/16/2008  . Insulin-requiring or dependent type II diabetes mellitus ([place]) 03/16/2008   Past Medical History:  Past Medical History:  Diagnosis Date  . Acute encephalopathy admitted 12/28/2013  . Bladder tumor    Recurrent  . Bradycardia 01/16/2016  . Cancer [facility])    "had TURBT; cancer free since ~ 11/2013/MD" (12/27/2013)  . Carotid artery disease ([place])    [place] 12/15: Bilateral ICA 1-39 // [place] 1/18: Bilateral ICA 1-39  . Chronic lower back pain   . Coronary artery disease    a. Cath 12/27/2013 EF 35-40%, 80-90% prox LAD treated with balloon angioplasty and 3.0x28 mm Xience DES, LCx with anomalous origin, 50-60% stenosis in OM, 50% stenosis in PDA and PLA // b. [place] 1/18: mLAD stent ok, dLAD 80, RI stent ok, oLCx stent ok, OM1 85, oRCA stent ok, mRA 40, dRCA 40, RPDA 60/60 >> med Rx  . Diabetes mellitus type II   . GERD  (gastroesophageal reflux disease)   . History of echocardiogram    Echo 12/15: Mild LVH, EF 45-50, inf HK, Gr 1 diastolic dysfunction  // Echo 1/18: Moderate LVH, EF 45-50, diffuse HK, mild LAE, RA upper limits of normal // TEE 1/18:  EF 45, diffuse HK, frequent PVCs, trivial MR, mild LAE, normal RVSF, no LA or RA clot, + PFO   . History of hiatal hernia   . Hyperlipidemia   . Hypertension   . Inferior myocardial infarction ([place]) 1994   Inferior, PCI and Streptokinase  . Kidney stones    "passed them all" (12/27/2013)  . Obesity   . Seizure disorder as sequela of cerebrovascular accident ([place])   . Stroke [facility]) ~ 2012; 2018   multiple strokes // embolic CVA 04/2593 >> admx with encephalopathy >> s/p ILR  . TIA (transient ischemic attack) "several"  . Tobacco user    Quit 1994  . Wide-complex tachycardia ([place])    Past Surgical History:  Past Surgical History:  Procedure Laterality Date  . CARDIAC CATHETERIZATION     "he's had a couple before today where they just looked around" (12/27/2013)  . CARDIAC CATHETERIZATION N/A 01/15/2016   Procedure: Left Heart Cath and Coronary Angiography;  Surgeon: Peter M Martinique, MD;  Location: [place] CV LAB;  Service: Cardiovascular;  Laterality: N/A;  . CORONARY ANGIOPLASTY WITH STENT PLACEMENT     "he's had 3-4 stents put in before today"   . CORONARY ANGIOPLASTY WITH STENT PLACEMENT  12/27/2013   "1"  .  EP IMPLANTABLE DEVICE N/A 01/20/2016   Procedure: Loop Recorder Insertion;  Surgeon: Evans Lance, MD;  Location: [place] CV LAB;  Service: Cardiovascular;  Laterality: N/A;  . LEFT HEART CATHETERIZATION WITH CORONARY ANGIOGRAM N/A 10/02/2011   Procedure: LEFT HEART CATHETERIZATION WITH CORONARY ANGIOGRAM;  Surgeon: [name] Bow, MD;  Location: [facility] CATH LAB;  Service: Cardiovascular;  Laterality: N/A;  . LEFT HEART CATHETERIZATION WITH CORONARY ANGIOGRAM N/A 12/27/2013   Procedure: LEFT HEART CATHETERIZATION WITH CORONARY ANGIOGRAM;   Surgeon: Blane Ohara, MD;  Location: [facility] CATH LAB;  Service: Cardiovascular;  Laterality: N/A;  . TEE WITHOUT CARDIOVERSION N/A 01/20/2016   Procedure: TRANSESOPHAGEAL ECHOCARDIOGRAM (TEE);  Surgeon: Larey Dresser, MD;  Location: [place];  Service: Cardiovascular;  Laterality: N/A;  . TRANSURETHRAL RESECTION OF BLADDER TUMOR WITH GYRUS (TURBT-GYRUS)  "several times"   HPI:  Carl Stephenson is a 72 y.o. male with medical history significant for chronic combined systolic/diastolic CHF, insulin-dependent diabetes mellitus, chronic kidney disease stage III, and history of multiple strokes (May 2019), and presenting to the emergency department with 2 days of generalized weakness with gait difficulty. Patient was admitted to the hospital in May with similar symptoms, new areas of ischemia were noted on MRI brain despite dual antiplatelet therapy. He was discharged home with wife on Eliquis at that time and had been fairly stable until developing a poor appetite and generalized weakness with a past 2 days. He is typically able to ambulate with a walker, but has been unable to do this for the past 2 days. There has not been any significant cough, no diarrhea or vomiting, and the patient has not complained of anything.  MRI revealed three subcentimeter foci of acute/early subacute infarction in left frontal and parietal lobes as well as the right putamen. No hemorrhage or mass effect, advanced chronic microvascular ischemic changes, volume loss, and numerous small chronic infarcts of the brain.    Assessment / Plan / Recommendation Clinical Impression  Pt's cognitive linguistic function appears very close to baseline at discharge from previous admission. He presents with signficant deficits in attention, task initiation, problem solving, following 1-2 step directions and safety awareness. Pt is disoriented and unable to recall basic information. ST to follow briefly for cognition and pt is likely to  require more assistance at discharge with possible placement in SNF.     SLP Assessment  SLP Recommendation/Assessment: Patient needs continued Speech Lanaguage Pathology Services SLP Visit Diagnosis: Cognitive communication deficit (R41.841);Attention and concentration deficit    Follow Up Recommendations  Skilled Nursing facility    Frequency and Duration min 2x/week  2 weeks      SLP Evaluation Cognition  Overall Cognitive Status: Difficult to assess(hx of significant baseline impairments) Arousal/Alertness: Awake/alert Orientation Level: Oriented to person Attention: Sustained;Focused Focused Attention: Appears intact Sustained Attention: Impaired Sustained Attention Impairment: Verbal basic;Functional basic Memory: Impaired Memory Impairment: Storage deficit;Decreased recall of new information;Decreased short term memory Decreased Short Term Memory: Verbal basic;Functional basic Awareness: Impaired Awareness Impairment: Intellectual impairment;Emergent impairment;Anticipatory impairment Problem Solving: Impaired Problem Solving Impairment: Verbal basic;Functional basic Executive Function: Reasoning;Organizing;Initiating;Sequencing Reasoning: Impaired Reasoning Impairment: Verbal basic;Functional basic Sequencing: Impaired Sequencing Impairment: Verbal basic;Functional basic Organizing: Impaired Organizing Impairment: Verbal basic;Functional basic Initiating: Impaired Initiating Impairment: Verbal basic;Functional basic Safety/Judgment: Impaired       Comprehension  Auditory Comprehension Overall Auditory Comprehension: Impaired Yes/No Questions: Within Functional Limits Commands: Impaired One Step Basic Commands: 75-100% accurate Two Step Basic Commands: 50-74% accurate Multistep Basic Commands: 0-24% accurate  Conversation: Simple Interfering Components: Attention;Processing speed;Working memory EffectiveTechniques: Slowed speech;Pausing;Repetition;Extra  processing time;Visual/Gestural cues Reading Comprehension Reading Status: Not tested    Expression Expression Primary Mode of Expression: Verbal Verbal Expression Overall Verbal Expression: Impaired at baseline Initiation: Impaired Automatic Speech: Name;Social Response Level of Generative/Spontaneous Verbalization: Phrase Repetition: Impaired Level of Impairment: Sentence level Naming: Impairment Pragmatics: Impairment Impairments: Abnormal affect;Eye contact;Monotone Interfering Components: Attention;Premorbid deficit Effective Techniques: Open ended questions;Semantic cues;Sentence completion Non-Verbal Means of Communication: Not applicable Written Expression Dominant Hand: Right Written Expression: Not tested   Oral / Motor  Oral Motor/Sensory Function Overall Oral Motor/Sensory Function: Mild impairment(appear chronic at this time) Facial ROM: Reduced left;Suspected CN VII (facial) dysfunction Facial Symmetry: Abnormal symmetry left;Suspected CN VII (facial) dysfunction Facial Strength: Reduced left;Suspected CN VII (facial) dysfunction Lingual ROM: Within Functional Limits Lingual Symmetry: Within Functional Limits Lingual Strength: Reduced;Suspected CN XII (hypoglossal) dysfunction Motor Speech Overall Motor Speech: Impaired Respiration: Within functional limits Phonation: Low vocal intensity Resonance: Within functional limits Articulation: Impaired Level of Impairment: Sentence Intelligibility: Intelligible Motor Planning: Witnin functional limits Motor Speech Errors: Not applicable   GO                    Carl Stephenson 07/27/2017, 10:36 AM

## 2017-07-27 NOTE — Progress Notes (Signed)
Rehab Admissions Coordinator Note:  Per PT recommendation, patient was screened by Jhonnie Garner for appropriateness for an Inpatient Acute Rehab Consult.  At this time, we are recommending Inpatient Rehab consult. AC will contact MD for order placement. Please call if questions.   Jhonnie Garner 07/27/2017, 11:58 AM  I can be reached at [phone].

## 2017-07-27 NOTE — Progress Notes (Signed)
STROKE TEAM CONSULT NOTE   Chief Complaint:   Generalized weakness and difficulty speech  HPI Carl Stephenson is a 72 y.o. male with history of multiple previous cryptogenic strokes since 2012 on eliquis and plavix, HTN, HLD, DM, loop recorder placement but neg for AF, obesity, CAD/MI w/ stent, CKD, bladder cancer admitted for above chief complains.  As per wife, pt has been lethargic for the last 3 days with generalized weakness but still able to get out from bed with help. However, yesterday morning, he was leaning towards left in the bed and can not get up even with 2 people help. Once put into chair, he was leaning to right and not able to hold himself in chair. When asking him, he was not able to answer questions, and with difficulty speaking. EMS was called and he was sent to [facility] for evaluation.   He had multiple embolic strokes in the past, etiology not clear, concerning for afib but loop recorder did not show clear cut afib. He was put on eliquis 5mg  bid and ASA 81mg  on discharge in 05/2016. However, currently at home he is taking eliquis 5mg  bid and plavix 75mg  daily. He has gradual decline over the years and currently he is just returned home for rehab and needs help for ADLs.   Review of Symptoms ROS was attempted today and was able to be performed.  Systems assessed include - Constitutional, Eyes, HENT, Respiratory, Cardiovascular, Gastrointestinal, Genitourinary, Integument/breast, Hematologic/lymphatic, Musculoskeletal, Neurological, Behavioral/Psych, Endocrine,  Allergic/Immunologic - the patient complains of only the following symptoms, and all other reviewed systems are negative.    Vitals:   07/27/17 1134 07/27/17 1135 07/27/17 1340 07/27/17 1500  BP: (!) 142/77 (!) 142/77 (!) 143/65 (!) 167/62  Pulse:  (!) 55 (!) 55   Resp:  17 16 20   Temp: 98 F (36.7 C)     TempSrc: Oral     SpO2:  100% 97% 98%  Weight:      Height:        CBC:  Recent Labs  Lab  07/26/17 1700  WBC 8.5  NEUTROABS 6.3  HGB 12.7*  HCT 38.8*  MCV 82.4  PLT 381    Basic Metabolic Panel:  Recent Labs  Lab 07/26/17 1700 07/27/17 0410  NA 141 143  K 4.0 3.7  CL 108 109  CO2 24 26  GLUCOSE 139* 132*  BUN 23 20  CREATININE 1.50* 1.28*  CALCIUM 9.0 9.1   Lipid Panel:     Component Value Date/Time   CHOL 218 (H) 06/05/2017 0233   CHOL 231 (H) 02/09/2017 1420   TRIG 236 (H) 06/05/2017 0233   HDL 28 (L) 06/05/2017 0233   HDL 30 (L) 02/09/2017 1420   CHOLHDL 7.8 06/05/2017 0233   VLDL 47 (H) 06/05/2017 0233   LDLCALC 143 (H) 06/05/2017 0233   LDLCALC 123 (H) 02/09/2017 1420   HgbA1c:  Lab Results  Component Value Date   HGBA1C 5.8 (H) 06/05/2017   Urine Drug Screen:     Component Value Date/Time   LABOPIA NONE DETECTED 07/26/2017 1900   COCAINSCRNUR NONE DETECTED 07/26/2017 1900   LABBENZ NONE DETECTED 07/26/2017 1900   AMPHETMU NONE DETECTED 07/26/2017 1900   THCU NONE DETECTED 07/26/2017 1900   LABBARB (A) 07/26/2017 1900    Result not available. Reagent lot number recalled by manufacturer.    Alcohol Level     Component Value Date/Time   ETH <10 07/26/2017 1700    IMAGING I  have personally reviewed the radiological images below and agree with the radiology interpretations.  Ct Head Wo Contrast  Result Date: 07/26/2017 CLINICAL DATA:  Increasing weakness over the past few days. Unable to walk due to weakness. EXAM: CT HEAD WITHOUT CONTRAST TECHNIQUE: Contiguous axial images were obtained from the base of the skull through the vertex without intravenous contrast. COMPARISON:  CT head 06/04/2017. MR head 06/04/2017. FINDINGS: Brain: No definite acute stroke, acute hemorrhage, mass lesion, hydrocephalus, or extra-axial fluid. Advanced cerebral and cerebellar atrophy. Hypoattenuation of white matter, likely small vessel disease. Evolutionary change of the previously noted acute infarction to subacute/chronic appearance in the RIGHT frontal deep  white matter and LEFT internal capsular regions. Continued hypodensity of the brainstem representing chronic ischemia. Vascular: Calcification of the cavernous internal carotid arteries consistent with cerebrovascular atherosclerotic disease. No signs of intracranial large vessel occlusion. Skull: Calvarium intact. Sinuses/Orbits: Clear sinuses. Other: None. IMPRESSION: Advanced atrophy and small vessel disease. No definite acute intracranial findings. Electronically Signed   By: Staci Righter M.D.   On: 07/26/2017 18:07   Ct Chest W Contrast  Result Date: 07/27/2017 CLINICAL DATA:  72 year old with personal history of bladder carcinoma. Restaging evaluation. EXAM: CT CHEST, ABDOMEN, AND PELVIS WITH CONTRAST TECHNIQUE: Multidetector CT imaging of the chest, abdomen and pelvis was performed following the standard protocol during bolus administration of intravenous contrast. CONTRAST:  137mL OMNIPAQUE IOHEXOL 300 MG/ML IV. Oral contrast was also administered. COMPARISON:  CT abdomen and pelvis 05/17/2013, 11/04/2012. CT chest 11/02/2012. FINDINGS: Beam hardening streak artifact is present in the lower chest and upper abdomen as the patient was unable to raise the arms over the head. CT CHEST FINDINGS Cardiovascular: Heart mildly enlarged, increased in size since 2014. Three-vessel coronary artery disease with prior LAD and RIGHT coronary artery stenting. No pericardial effusion. Mild atherosclerosis involving the thoracic aorta without evidence of aneurysm. Central pulmonary arteries widely patent. Mediastinum/Nodes: No pathologically enlarged mediastinal, hilar or axillary lymph nodes. No mediastinal masses. Normal-appearing esophagus. Normal-appearing thyroid gland. Lungs/Pleura: Pleural based nodule involving the RIGHT MIDDLE LOBE adjacent to the major fissure measuring approximately 13 x 7 mm (mean 10 mm), unchanged since the 2014 CT. No new, enlarging or suspicious lung nodules. Emphysematous changes  throughout both lungs with peripheral blebs involving the UPPER lobes bilaterally. No confluent airspace consolidation. No pleural effusions. Central airways patent without significant bronchial wall thickening. Musculoskeletal: Degenerative disc disease and spondylosis involving the visualized LOWER cervical spine. 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 Exaggeration of the usual thoracic kyphosis. No acute findings. No evidence of osseous metastatic disease. CT ABDOMEN PELVIS FINDINGS Hepatobiliary: Liver normal in size and appearance. Gallbladder normal in appearance without calcified gallstones. No biliary ductal dilation. Pancreas: Atrophic. No pancreatic mass or peripancreatic edema/inflammation. Spleen: Splenomegaly, the spleen measuring approximately 11.4 x 6.4 x 15.0 cm, yielding a volume of approximately 547 mL. No focal splenic parenchymal abnormality. Adrenals/Urinary Tract: Normal appearing adrenal glands. Kidneys normal in size and appearance without focal parenchymal abnormality. No hydronephrosis. No evidence of urinary tract calculi. Urinary bladder decompressed without evidence of focal wall thickening or mass. Stomach/Bowel: Stomach decompressed and unremarkable by CT. Normal-appearing small bowel. Diverticulosis involving the cecum, descending colon and sigmoid colon without evidence of acute diverticulitis. Mobile cecum positioned in the RIGHT UPPER QUADRANT. Large amount of stool in the mildly dilated rectum. Normal-appearing short appendix in the RIGHT mid abdomen. Vascular/Lymphatic: Severe aortoiliofemoral atherosclerosis without evidence of aneurysm. No pathologic lymphadenopathy. Reproductive: Upper normal sized prostate gland for age,  containing calcifications. Normal seminal vesicles. Other: Small RIGHT inguinal hernia containing fat. Musculoskeletal: Multilevel degenerative disc disease, spondylosis and facet degenerative changes throughout the lumbar spine. No  acute findings. No evidence of osseous metastatic disease. IMPRESSION: CT Chest: 1. No acute cardiopulmonary disease. No evidence of metastatic disease in the thorax. 2. Stable benign subpleural RIGHT MIDDLE LOBE nodule dating back to 2010, therefore likely a benign subpleural lymph node. 3. Mild cardiomegaly. Three-vessel coronary artery disease with prior LAD and RIGHT coronary artery stenting. CT Abdomen Pelvis: 1. No acute abnormalities involving the abdomen or pelvis. No evidence of metastatic disease in the abdomen or pelvis. 2. Colonic diverticulosis without evidence of acute diverticulitis. 3. Mild splenomegaly, with slight interval increase in splenic size since 2015. Aortic Atherosclerosis (ICD10-I70.0) and Emphysema (ICD10-J43.9). Electronically Signed   By: Evangeline Dakin M.D.   On: 07/27/2017 14:34   Mr Brain Wo Contrast  Result Date: 07/26/2017 CLINICAL DATA:  72 y/o M; history of multiple strokes. Patient presents with 2 days of generalized weakness and gait difficulty. EXAM: MRI HEAD WITHOUT CONTRAST TECHNIQUE: Multiplanar, multiecho pulse sequences of the brain and surrounding structures were obtained without intravenous contrast. COMPARISON:  07/26/2017 CT head.  06/04/2017 MRI head. FINDINGS: Brain: Subcentimeter foci of reduced diffusion are present within the left posteromedial frontal lobe, left parietal white matter, and the right putamen compatible with acute/early subacute infarction. No associated hemorrhage or mass effect. Small chronic infarcts are present within the right cerebellar hemisphere, pons, bilateral thalami, bilateral lentiform nuclei, and bilateral caudate heads. Advanced chronic microvascular ischemic changes of white matter parenchymal volume loss of the brain. No extra-axial collection, hydrocephalus, or herniation. Small foci of susceptibility hypointensity are present scattered throughout white matter as well as in the left putamen and right medulla compatible  hemosiderin deposition of chronic microhemorrhage. Vascular: Normal flow voids. Skull and upper cervical spine: Normal marrow signal. Sinuses/Orbits: Negative. Other: None. IMPRESSION: 1. Three subcentimeter foci of acute/early subacute infarction in left frontal and parietal lobes as well as the right putamen. No hemorrhage or mass effect. 2. Advanced chronic microvascular ischemic changes, volume loss, and numerous small chronic infarcts of the brain. These results will be called to the ordering clinician or representative by the Radiologist Assistant, and communication documented in the PACS or zVision Dashboard. Electronically Signed   By: Kristine Garbe M.D.   On: 07/26/2017 23:05   Ct Abdomen Pelvis W Contrast  Result Date: 07/27/2017 CLINICAL DATA:  72 year old with personal history of bladder carcinoma. Restaging evaluation. EXAM: CT CHEST, ABDOMEN, AND PELVIS WITH CONTRAST TECHNIQUE: Multidetector CT imaging of the chest, abdomen and pelvis was performed following the standard protocol during bolus administration of intravenous contrast. CONTRAST:  140mL OMNIPAQUE IOHEXOL 300 MG/ML IV. Oral contrast was also administered. COMPARISON:  CT abdomen and pelvis 05/17/2013, 11/04/2012. CT chest 11/02/2012. FINDINGS: Beam hardening streak artifact is present in the lower chest and upper abdomen as the patient was unable to raise the arms over the head. CT CHEST FINDINGS Cardiovascular: Heart mildly enlarged, increased in size since 2014. Three-vessel coronary artery disease with prior LAD and RIGHT coronary artery stenting. No pericardial effusion. Mild atherosclerosis involving the thoracic aorta without evidence of aneurysm. Central pulmonary arteries widely patent. Mediastinum/Nodes: No pathologically enlarged mediastinal, hilar or axillary lymph nodes. No mediastinal masses. Normal-appearing esophagus. Normal-appearing thyroid gland. Lungs/Pleura: Pleural based nodule involving the RIGHT MIDDLE  LOBE adjacent to the major fissure measuring approximately 13 x 7 mm (mean 10 mm), unchanged since the 2014 CT. No  new, enlarging or suspicious lung nodules. Emphysematous changes throughout both lungs with peripheral blebs involving the UPPER lobes bilaterally. No confluent airspace consolidation. No pleural effusions. Central airways patent without significant bronchial wall thickening. Musculoskeletal: Degenerative disc disease and spondylosis involving the visualized LOWER cervical spine. 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 Exaggeration of the usual thoracic kyphosis. No acute findings. No evidence of osseous metastatic disease. CT ABDOMEN PELVIS FINDINGS Hepatobiliary: Liver normal in size and appearance. Gallbladder normal in appearance without calcified gallstones. No biliary ductal dilation. Pancreas: Atrophic. No pancreatic mass or peripancreatic edema/inflammation. Spleen: Splenomegaly, the spleen measuring approximately 11.4 x 6.4 x 15.0 cm, yielding a volume of approximately 547 mL. No focal splenic parenchymal abnormality. Adrenals/Urinary Tract: Normal appearing adrenal glands. Kidneys normal in size and appearance without focal parenchymal abnormality. No hydronephrosis. No evidence of urinary tract calculi. Urinary bladder decompressed without evidence of focal wall thickening or mass. Stomach/Bowel: Stomach decompressed and unremarkable by CT. Normal-appearing small bowel. Diverticulosis involving the cecum, descending colon and sigmoid colon without evidence of acute diverticulitis. Mobile cecum positioned in the RIGHT UPPER QUADRANT. Large amount of stool in the mildly dilated rectum. Normal-appearing short appendix in the RIGHT mid abdomen. Vascular/Lymphatic: Severe aortoiliofemoral atherosclerosis without evidence of aneurysm. No pathologic lymphadenopathy. Reproductive: Upper normal sized prostate gland for age, containing calcifications. Normal seminal vesicles.  Other: Small RIGHT inguinal hernia containing fat. Musculoskeletal: Multilevel degenerative disc disease, spondylosis and facet degenerative changes throughout the lumbar spine. No acute findings. No evidence of osseous metastatic disease. IMPRESSION: CT Chest: 1. No acute cardiopulmonary disease. No evidence of metastatic disease in the thorax. 2. Stable benign subpleural RIGHT MIDDLE LOBE nodule dating back to 2010, therefore likely a benign subpleural lymph node. 3. Mild cardiomegaly. Three-vessel coronary artery disease with prior LAD and RIGHT coronary artery stenting. CT Abdomen Pelvis: 1. No acute abnormalities involving the abdomen or pelvis. No evidence of metastatic disease in the abdomen or pelvis. 2. Colonic diverticulosis without evidence of acute diverticulitis. 3. Mild splenomegaly, with slight interval increase in splenic size since 2015. Aortic Atherosclerosis (ICD10-I70.0) and Emphysema (ICD10-J43.9). Electronically Signed   By: Evangeline Dakin M.D.   On: 07/27/2017 14:34    PHYSICAL EXAM  Temp:  [97.8 F (36.6 C)-98 F (36.7 C)] 98 F (36.7 C) (07/16 1134) Pulse Rate:  [25-158] 55 (07/16 1340) Resp:  [12-23] 20 (07/16 1500) BP: (125-173)/(49-79) 167/62 (07/16 1500) SpO2:  [86 %-100 %] 98 % (07/16 1500) Weight:  [164 lb 0.4 oz (74.4 kg)] 164 lb 0.4 oz (74.4 kg) (07/16 0132)  General - Well nourished, well developed, in no apparent distress.  Ophthalmologic - Fundi not visualized due to noncooperation.  Cardiovascular - Regular rate and rhythm.  Mental Status -  Level of arousal and orientation to self and wife, but not to time, age and place. Language including expression, repeating and comprehension was assessed and found intact, however, 1/3 naming, paucity of speech and significant psychomotor slowing. Fund of Knowledge was assessed and was severely impaired.  Cranial Nerves II - XII - II - Visual field intact OU. III, IV, VI - Extraocular movements intact. V -  Facial sensation intact bilaterally. VII - left facial droop. VIII - Hearing & vestibular intact bilaterally. X - Palate elevates symmetrically. XI - Chin turning & shoulder shrug intact bilaterally. XII - Tongue protrusion intact.  Motor Strength - The patient's strength was 4/5 BUEs and 3/5 BLEs.  Bulk was normal and fasciculations were absent.   Motor Tone -  Muscle tone was assessed at the neck and appendages and was normal.  Reflexes - The patient's reflexes were 1+ in all extremities and he had no pathological reflexes.  Sensory - Light touch, temperature/pinprick were assessed and were symmetrical.    Coordination - The patient had normal movements in the hands with no ataxia or dysmetria, but slow in action.  Tremor was absent.  Gait and Station - deferred.   ASSESSMENT/PLAN Carl Stephenson is a 72 y.o. male with history of chronic combined systolic/diastolic CHF, IDDM, CKD stage III, prior strokes, loop recorder neg for AF, obesity, CAD/MI w/ stent, band bladder cancer presenting with generalized weakness and gait instability x 2 days, acute worsening with speech difficulty yesterday.   Stroke:  3 punctate in different vascular territories, felt to be embolic secondary to unknown source   CT head No acute stroke. Small vessel disease. Atrophy.   MRI  2 punctate L frontal and parietal and one R putamen infarcts. Small vessel disease. Atrophy.  CTA head & neck 05/2017 high grade R M1 and prox B PCA stenosis, tandem L P2 high grade stenosis. atherosclerosis at ICA bifurcations and cavernous.   Pan CT - no malignancy  Loop recorder - confirmed with EP in May all reported AF episodes were NOT AF but SR w/ ectopy and PACs  LDL 143 in 05/2016  HgbA1c 5.8 in 05/2016   eliquis for VTE prophylaxis  D2 thin liquids  clopidogrel 75 mg daily and Eliquis (apixaban) daily prior to admission, now on clopidogrel 75 mg daily and Eliquis (apixaban) daily. Continue eliquis and  plavix. This is already maximized stroke prevention. No further recommendations at this time. Will have him follow up with [name] at [facility] for further opinion.   Therapy recommendations:  SNF  Disposition:  pending  Hx stroke/TIA ? 10/2010 - posterior corpus callosum on the left and felt to be a white matter lacune. ? 12/2013 - MRI left frontal and right PICA punctate infarcts, old left thalamic and b/l BG lacunar infarcts. 2 days post cardiac cath and stent placement. CTA h&n bilateral siphon and ICA proximal athero. LDL 68 and A1C 7.3. put on DAPT. TEE and loop not completed. ? 01/2016 - R brain, anterior and posterior circulation punctate infarcts, felt to be cardioembolic. 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 CUS neg, TT EF 45-50%, TEE small PFO, LDL 140 and A1C 7.7. Loop placed. Pt discharged with DAPT and crestor and zetia.   ? 07/2016 - R pontine, L corona radiata infarcts. CTA h/n R M1 stenosis. Admitted to [facility] hospital ? 05/2017 - Acute L basal ganglia infarct and acute/subacute R MCA/ACA x 2 infarcts in setting of multiple previous cryptogenic infarcts. All infarcts felt to be embolic secondary to unknown source. Discussed with family, put pt on eliquis 5mg  bid and ASA 81mg  for empiric treatment  History of seizure  01/2016 during admission - GTC - EEG showed left focal slowing - pt on keppra   Continue keppra  Hypertension  Stable on the high end . gradually normalize in 3-5 days . Long-term BP goal normotensive  Hyperlipidemia  Home meds:  zetia 10 (intolerant to statins), continued in hospital  LDL 143 in 05/2016, goal < 70  Add pravastatin 20mg  (hx of statin intolerance)  Continue statin at discharge  Diabetes type II  HgbA1c 5.8 in 05/2016, goal < 7.0  Controlled  SSI   CBG monitoring  Other Stroke Risk Factors  Advanced age  Former  Cigarette smoker  Coronary artery disease s/p stent  Additional problems  GERD  Depression  Cognitive  impairment  Gradual decline  Hospital day # 0  Neurology will sign off. Please call with questions. Pt will follow up with [name] at [facility] in about 6 weeks. Thanks for the consult.  Rosalin Hawking, MD PhD Stroke Neurology 07/27/2017 4:51 PM    To contact Stroke Continuity provider, please refer to [email or web address] After hours, contact General Neurology

## 2017-07-27 NOTE — Evaluation (Signed)
Physical Therapy Evaluation Patient Details Name: Carl Stephenson MRN: [id number] DOB: 03-20-1945 Today's Date: 07/27/2017   History of Present Illness  Patient is a 72 y/o male presenting with generalized weakness and gait difficulty. Noncontrast head CT is negative for definite acute finding. PMH significant for chronic combined systolic/diastolic CHF, insulin-dependent diabetes mellitus, chronic kidney disease stage III, and history of multiple strokes.  Continued neuro work-up.  Clinical Impression  Carl Stephenson admitted with the above listed diagnosis. PLOF gleaned from chart review as patient is a poor historian and no family present to confirm baseline. Was ambulatory with RW prior to admission. Patient today requiring Mod-Max A for bed mobility and Mod A +2 for sit to stand and stand pivot to recliner. Due to patients current presentation PT to recommend PT follow-up to progress functional mobility. PT to continue to follow acutely.     Follow Up Recommendations  CIR    Equipment Recommendations  (TBD)    Recommendations for Other Services   Rehab Consult    Precautions / Restrictions Precautions Precautions: Fall Restrictions Weight Bearing Restrictions: No      Mobility  Bed Mobility Overal bed mobility: Needs Assistance Bed Mobility: Supine to Sit     Supine to sit: Max assist     General bed mobility comments: Max A for LE management; able to initiate bringing LE towards EOB but with physical assist to complete and for upright trunk control with use of bed pad for positioning  Transfers Overall transfer level: Needs assistance Equipment used: Rolling walker (2 wheeled) Transfers: Sit to/from [name] Sit to Stand: Mod assist;+2 physical assistance Stand pivot transfers: Mod assist;+2 physical assistance       General transfer comment: max cueing for stepping pattern and safety; Mod A +2 to power up at bedside; Mod A for RW  management  Ambulation/Gait                Stairs            Wheelchair Mobility    Modified Rankin (Stroke Patients Only) Modified Rankin (Stroke Patients Only) Pre-Morbid Rankin Score: Moderately severe disability Modified Rankin: Severe disability     Balance Overall balance assessment: Needs assistance Sitting-balance support: Feet supported;Bilateral upper extremity supported;Single extremity supported Sitting balance-[name]: Poor Sitting balance - Comments: forward lean with patient picking up B LE from floor   Standing balance support: Bilateral upper extremity supported;During functional activity Standing balance-[name]: Poor Standing balance comment: relies on external assistance                             Pertinent Vitals/Pain Pain Assessment: No/denies pain    Home Living Family/patient expects to be discharged to:: Private residence Living Arrangements: Spouse/significant other Available Help at Discharge: Family Type of Home: House         Home Equipment: [name] - 2 wheels Additional Comments: unsure of home environment as patient is a poor historian with no family present     Prior Function Level of Independence: Needs assistance   Gait / Transfers Assistance Needed: ambulates with RW/rollator           Hand Dominance   Dominant Hand: Right    Extremity/Trunk Assessment   Upper Extremity Assessment Upper Extremity Assessment: Defer to OT evaluation    Lower Extremity Assessment Lower Extremity Assessment: Generalized weakness    Cervical / Trunk Assessment Cervical / Trunk Assessment: Kyphotic  Communication  Communication: No difficulties  Cognition Arousal/Alertness: Awake/alert Behavior During Therapy: Flat affect Overall Cognitive Status: Difficult to assess(hx of significant baseline impairments) Area of Impairment: Orientation;Following commands;Safety/judgement;Problem solving                  Orientation Level: Disoriented to;Place;Time;Situation     Following Commands: Follows one step commands with increased time Safety/Judgement: Decreased awareness of safety;Decreased awareness of deficits   Problem Solving: Slow processing;Decreased initiation;Difficulty sequencing;Requires verbal cues;Requires tactile cues General Comments: increased time for conversation, slow processing      General Comments      Exercises     Assessment/Plan    PT Assessment Patient needs continued PT services  PT Problem List Decreased strength;Decreased range of motion;Decreased activity tolerance;Decreased balance;Decreased mobility;Decreased coordination;Decreased cognition;Decreased knowledge of use of DME;Decreased safety awareness;Decreased knowledge of precautions       PT Treatment Interventions DME instruction;Stair training;Gait training;Functional mobility training;Therapeutic activities;Therapeutic exercise;Balance training;Neuromuscular re-education;Patient/family education    PT Goals (Current goals can be found in the Care Plan section)  Acute Rehab PT Goals Patient Stated Goal: none stated PT Goal Formulation: With patient Time For Goal Achievement: 08/10/17 Potential to Achieve Goals: Fair    Frequency Min 4X/week   Barriers to discharge        Co-evaluation               AM-PAC PT "6 Clicks" Daily Activity  Outcome Measure Difficulty turning over in bed (including adjusting bedclothes, sheets and blankets)?: Unable Difficulty moving from lying on back to sitting on the side of the bed? : Unable Difficulty sitting down on and standing up from a chair with arms (e.g., wheelchair, bedside commode, etc,.)?: Unable Help needed moving to and from a bed to chair (including a wheelchair)?: A Lot Help needed walking in hospital room?: Total Help needed climbing 3-5 steps with a railing? : Total 6 Click Score: 7    End of Session Equipment Utilized  During Treatment: Gait belt Activity Tolerance: Patient tolerated treatment well Patient left: in chair;with call bell/phone within reach;with chair alarm set;with family/visitor present Nurse Communication: Mobility status PT Visit Diagnosis: Other abnormalities of gait and mobility (R26.89);Muscle weakness (generalized) (M62.81);Other symptoms and signs involving the nervous system (R00.762)    Time: 2633-3545 PT Time Calculation (min) (ACUTE ONLY): 39 min   Charges:   PT Evaluation $PT Eval Moderate Complexity: 1 Mod PT Treatments $Therapeutic Activity: 8-22 mins   PT G Codes:       Lanney Gins, PT, DPT 07/27/17 12:47 PM Pager: [phone]

## 2017-07-28 LAB — GLUCOSE, CAPILLARY
GLUCOSE-CAPILLARY: 115 mg/dL — AB (ref 70–99)
GLUCOSE-CAPILLARY: 119 mg/dL — AB (ref 70–99)
GLUCOSE-CAPILLARY: 129 mg/dL — AB (ref 70–99)
Glucose-Capillary: 109 mg/dL — ABNORMAL HIGH (ref 70–99)
Glucose-Capillary: 90 mg/dL (ref 70–99)
Glucose-Capillary: 119 mg/dL — ABNORMAL HIGH (ref 70–99)

## 2017-07-28 LAB — BASIC METABOLIC PANEL
ANION GAP: 8 (ref 5–15)
BUN: 15 mg/dL (ref 8–23)
CHLORIDE: 108 mmol/L (ref 98–111)
CO2: 24 mmol/L (ref 22–32)
Calcium: 9 mg/dL (ref 8.9–10.3)
Creatinine, Ser: 1.21 mg/dL (ref 0.61–1.24)
GFR calc non Af Amer: 58 mL/min — ABNORMAL LOW (ref >60)
GLUCOSE: 119 mg/dL — AB (ref 70–99)
Potassium: 3.7 mmol/L (ref 3.5–5.1)
Sodium: 140 mmol/L (ref 135–145)

## 2017-07-28 LAB — CBC
HCT: 39.7 % (ref 39.0–52.0)
Hemoglobin: 13.1 g/dL (ref 13.0–17.0)
MCH: 26.8 pg (ref 26.0–34.0)
MCHC: 33 g/dL (ref 30.0–36.0)
MCV: 81.2 fL (ref 78.0–100.0)
PLATELETS: 173 10*3/uL (ref 150–400)
RBC: 4.89 MIL/uL (ref 4.22–5.81)
RDW: 14.1 % (ref 11.5–15.5)
WBC: 7.3 10*3/uL (ref 4.0–10.5)

## 2017-07-28 LAB — FOLATE RBC
FOLATE, HEMOLYSATE: 575.8 ng/mL
FOLATE, RBC: 1499 ng/mL (ref >498)
Hematocrit: 38.4 % (ref 37.5–51.0)

## 2017-07-28 NOTE — Clinical Social Work Placement (Signed)
   CLINICAL SOCIAL WORK PLACEMENT  NOTE  Date:  07/28/2017  Patient Details  Name: Carl Stephenson MRN: [id number] Date of Birth: 26-Feb-1945  Clinical Social Work is seeking post-discharge placement for this patient at the [place] level of care (*CSW will initial, date and re-position this form in  chart as items are completed):  Yes   Patient/family provided with [place] [facility] Work Department's list of facilities offering this level of care within the geographic area requested by the patient (or if unable, by the patient's family).  Yes   Patient/family informed of their freedom to choose among providers that offer the needed level of care, that participate in Medicare, Medicaid or managed care program needed by the patient, have an available bed and are willing to accept the patient.  Yes   Patient/family informed of [facility] ownership interest in [facility] and [facility], as well as of the fact that they are under no obligation to receive care at these facilities.  PASRR submitted to EDS on 07/28/17     PASRR number received on       Existing PASRR number confirmed on 07/28/17     FL2 transmitted to all facilities in geographic area requested by pt/family on 07/28/17     FL2 transmitted to all facilities within larger geographic area on       Patient informed that his/her managed care company has contracts with or will negotiate with certain facilities, including the following:            Patient/family informed of bed offers received.  Patient chooses bed at       Physician recommends and patient chooses bed at      Patient to be transferred to   on  .  Patient to be transferred to facility by       Patient family notified on   of transfer.  Name of family member notified:        PHYSICIAN Please sign FL2     Additional Comment:    _______________________________________________ Candie Chroman, LCSW 07/28/2017,  3:34 PM

## 2017-07-28 NOTE — Progress Notes (Signed)
  Speech Language Pathology Treatment: Dysphagia;Cognitive-Linquistic  Patient Details Name: Carl Stephenson MRN: [id number] DOB: 11-25-1945 Today's Date: 07/28/2017 Time: 0802-0826 SLP Time Calculation (min) (ACUTE ONLY): 24 min  Assessment / Plan / Recommendation Clinical Impression  Skilled treatment session focused on dysphagia, cognition and education with daughter. SLP received pt severely coughing with nursing and daughter in room. Unknown cause of coughing but significant widespread oral residue of ground sausage present in mouth. Pt is not able to follow directions for lingual sweep to create cohesive bolus. Small sips of thin liquids via cup helpful in clearing residue but creates immediate cough. At baseline, pt with increased coughing at home with and without PO intake (per daughter). Daughter also provides that he had significant decline in consumption of liquids prior to hospitalization that is related to his overall chronic decline.  Previous MBS (05/2017) reviewed with pt and daughter with trials of nectar thick and honey thick administered at bedside. Immediate coughing was eliminated with honey thick via spoon. With nectar via spoon and cup sips, pt with appearance of delayed swallow initiation and coughing with audible reside heard. Per MBS honey thick was recommended if thin liquids was not tolerated. Education provided to daughter that most conservative diet would be dysphagia 1 with honey thick liquids via spoon, medicine crushed with puree. Another instrumental study is not clinically indicated at this time d/t decline and inability to utilize compensatory strategies. This Probation officer spoke with MD, provided information on diet downgrade and possibility of receiving palliative care consult.    HPI HPI: Carl Stephenson is a 72 y.o. male with medical history significant for chronic combined systolic/diastolic CHF, insulin-dependent diabetes mellitus, chronic kidney disease stage III, and  history of multiple strokes (May 2019), and presenting to the emergency department with 2 days of generalized weakness with gait difficulty. Patient was admitted to the hospital in May with similar symptoms, new areas of ischemia were noted on MRI brain despite dual antiplatelet therapy. He was discharged home with wife on Eliquis at that time and had been fairly stable until developing a poor appetite and generalized weakness with a past 2 days. He is typically able to ambulate with a walker, but has been unable to do this for the past 2 days. There has not been any significant cough, no diarrhea or vomiting, and the patient has not complained of anything.  MRI revealed three subcentimeter foci of acute/early subacute infarction in left frontal and parietal lobes as well as the right putamen. No hemorrhage or mass effect, advanced chronic microvascular ischemic changes, volume loss, and numerous small chronic infarcts of the brain.       SLP Plan  Continue with current plan of care       Recommendations  Diet recommendations: Dysphagia 1 (puree);Honey-thick liquid Liquids provided via: Teaspoon Medication Administration: Crushed with puree Supervision: Trained caregiver to feed patient;Staff to assist with self feeding;Full supervision/cueing for compensatory strategies Compensations: Minimize environmental distractions;Slow rate;Small sips/bites Postural Changes and/or Swallow Maneuvers: Seated upright 90 degrees                Oral Care Recommendations: Oral care before and after PO Follow up Recommendations: Skilled Nursing facility SLP Visit Diagnosis: Dysphagia, oropharyngeal phase (R13.12);Cognitive communication deficit (R41.841);Attention and concentration deficit Plan: Continue with current plan of care       [place] 07/28/2017, 9:53 AM

## 2017-07-28 NOTE — Progress Notes (Signed)
Physical Therapy Treatment Patient Details Name: Carl Stephenson MRN: [id number] DOB: 1945-12-03 Today's Date: 07/28/2017    History of Present Illness Patient is a 72 y/o male presenting with generalized weakness and gait difficulty. Noncontrast head CT is negative for definite acute finding. PMH significant for chronic combined systolic/diastolic CHF, insulin-dependent diabetes mellitus, chronic kidney disease stage III, and history of multiple strokes.  Continued neuro work-up.    PT Comments    Patient agreeable to participate in therapy. Patient requires mod +2 assist for safe OOB mobility. Pt 's daughter present throughout session. Pt will continue to benefit from further skilled PT services both acute and post acute to maximize independence and safety with mobility.   Follow Up Recommendations  SNF     Equipment Recommendations  (TBD)    Recommendations for Other Services       Precautions / Restrictions Precautions Precautions: Fall Restrictions Weight Bearing Restrictions: No    Mobility  Bed Mobility Overal bed mobility: Needs Assistance Bed Mobility: Supine to Sit     Supine to sit: Max assist;HOB elevated     General bed mobility comments: assistance required to bring bilat LE all the way to EOB, scoot hips with bed pad, and elevate trunk into sitting; cues for sequencing   Transfers Overall transfer level: Needs assistance Equipment used: Rolling walker (2 wheeled) Transfers: Sit to/from [name] Sit to Stand: Mod assist;+2 physical assistance Stand pivot transfers: Mod assist;+2 physical assistance       General transfer comment: assist to power up into standing from EOB X2 and able to stand for pericare prior to pivoting to chair; assistance required for standing balance with RW and management of RW to turn; cues for sequencing of and use of AD when taking steps to chair   Ambulation/Gait                 Stairs             Wheelchair Mobility    Modified Rankin (Stroke Patients Only) Modified Rankin (Stroke Patients Only) Pre-Morbid Rankin Score: Moderately severe disability Modified Rankin: Severe disability     Balance Overall balance assessment: Needs assistance Sitting-balance support: Feet supported;Bilateral upper extremity supported;Single extremity supported Sitting balance-[name]: Poor   Postural control: Right lateral lean Standing balance support: Bilateral upper extremity supported;During functional activity Standing balance-[name]: Poor Standing balance comment: relies on external assistance                            Cognition Arousal/Alertness: Awake/alert Behavior During Therapy: Flat affect Overall Cognitive Status: Impaired/Different from baseline Area of Impairment: Following commands;Safety/judgement;Problem solving                       Following Commands: Follows one step commands with increased time Safety/Judgement: Decreased awareness of safety;Decreased awareness of deficits   Problem Solving: Slow processing;Decreased initiation;Difficulty sequencing;Requires verbal cues;Requires tactile cues        Exercises      General Comments        Pertinent Vitals/Pain Pain Assessment: No/denies pain    Home Living                      Prior Function            PT Goals (current goals can now be found in the care plan section) Acute Rehab PT Goals PT Goal Formulation: With  patient Time For Goal Achievement: 08/10/17 Potential to Achieve Goals: Fair Progress towards PT goals: Progressing toward goals    Frequency    Min 4X/week      PT Plan Discharge plan needs to be updated    Co-evaluation              AM-PAC PT "6 Clicks" Daily Activity  Outcome Measure  Difficulty turning over in bed (including adjusting bedclothes, sheets and blankets)?: Unable Difficulty moving from lying on back to  sitting on the side of the bed? : Unable Difficulty sitting down on and standing up from a chair with arms (e.g., wheelchair, bedside commode, etc,.)?: Unable Help needed moving to and from a bed to chair (including a wheelchair)?: A Lot Help needed walking in hospital room?: A Lot Help needed climbing 3-5 steps with a railing? : Total 6 Click Score: 8    End of Session Equipment Utilized During Treatment: Gait belt Activity Tolerance: Patient tolerated treatment well Patient left: in chair;with call bell/phone within reach;with chair alarm set;with family/visitor present Nurse Communication: Mobility status PT Visit Diagnosis: Other abnormalities of gait and mobility (R26.89);Muscle weakness (generalized) (M62.81);Other symptoms and signs involving the nervous system (H79.810)     Time: 2548-6282 PT Time Calculation (min) (ACUTE ONLY): 30 min  Charges:  $Therapeutic Activity: 23-37 mins                    G Codes:       Earney Navy, PTA Pager: [phone]     Darliss Cheney 07/28/2017, 10:21 AM

## 2017-07-28 NOTE — Progress Notes (Addendum)
PROGRESS NOTE    Carl Stephenson  DQQ:[id number] DOB: [birth year] DOA: 07/26/2017 PCP: Myer Peer, MD   Brief Narrative: Patient is a 72 year old male with past medical history significant for combined systolic/diastolic CHF, insulin-dependent diabetes mellitus, CKD stage III, history of multiple strokes who presented to the emergency department with 2-day history of generalized weakness and gait difficulty.  He was admitted in May with similar symptoms during which new areas of ischemia were noted on his MRI despite dual antiplatelet therapy.  He was discharged home on Eliquis.  Patient found with 3 new small infarcts on repeat MRI this time.  Neurology was following.  Plan is to continue Eliquis and Plavix.  Current plan is to discharge to skilled nursing facility.  Waiting for bed/authorisation.  Stable for discharge as soon as the bed is available.  Assessment & Plan:   Principal Problem:   Weakness Active Problems:   Insulin-requiring or dependent type II diabetes mellitus ([place])   Coronary artery disease involving native coronary artery of native heart without angina pectoris   Seizures (HCC)   Chronic combined systolic and diastolic CHF (congestive heart failure) (HCC)   History of embolic stroke   CKD (chronic kidney disease), stage III (HCC)   Cerebral embolism with cerebral infarction   Ischemic stroke ([place])   Acute renal failure superimposed on stage 3 chronic kidney disease (HCC)   Ataxia  Acute ischemic stroke: MRI showed acute/subacute subcentimeter infarction in left frontal/parietal lobes and also in right putamen.  Patient already on Eliquis and Plavix.  No hemorrhage or mass-effect seen.  Neurology was following.  Patient had recent extensive work-up including echo, vascular potential loop recorder.  Neurology also recommended CT chest/abdomen/pelvis in order to rule out underlying malignancy which could be responsible for increased viscosity and embolic source but the  imagings did not show any significant findings. Physical therapy was initially recommending CIR but the recommendation has been changed to skilled nursing facility now.  Social worker following. Patient will follow with [facility] neurology as an outpatient after discharge.  Coronary artery disease: Denies any chest pain.  Stable.  Continue Plavix  Hyperlipidemia: Started on low-dose Pravachol and Zetia  .  Insulin dependent diabetes type 2: Last hemoglobin A1c 5.8.  Continue sliding-scale insulin and long-acting insulin.  Hypertension: On lisinopril and Lasix at home.  CKD stage III: At baseline.  Hold nephrotoxic agents.  History of seizure: No seizure activity noted during this admission.  Continue Keppra  History of GERD: Continue PPI  Depression: Continue Zoloft  Chronic dysphagia:Associated with stroke. Speech therapy evaluated the patient and recommended dysphagia 1, honey thick liquid.    DVT prophylaxis: eliquis Code Status: Full Family Communication: Discussed with the daughter at the bedside Disposition Plan: Skilled nursing facility as soon as the bed is available   Consultants: Neurology  Procedures: None  Antimicrobials: None  Subjective: Patient seen and examined the bedside this morning.  Remains comfortable.  No new issues/events.  Daughter present at the bedside.  Explained discharge planning to skilled nursing facility.  Objective: Vitals:   07/27/17 2014 07/28/17 0058 07/28/17 0441 07/28/17 0746  BP: (!) 127/53 (!) 126/53 (!) 163/74   Pulse: 61 (!) 57 64   Resp:      Temp: 98.5 F (36.9 C)  (!) 97.5 F (36.4 C) 98.6 F (37 C)  TempSrc: Oral  Oral Oral  SpO2:  96% 99%   Weight:      Height:  Intake/Output Summary (Last 24 hours) at 07/28/2017 0902 Last data filed at 07/28/2017 3716 Gross per 24 hour  Intake 0 ml  Output 2250 ml  Net -2250 ml   Filed Weights   07/26/17 2110 07/27/17 0132  Weight: 74.4 kg (164 lb 0.4 oz) 74.4 kg (164  lb 0.4 oz)    Examination:  General exam: Appears calm and comfortable ,Not in distress,average built HEENT:PERRL,Oral mucosa moist, Ear/Nose normal on gross exam, left facial droop Respiratory system: Bilateral equal air entry, normal vesicular breath sounds, no wheezes or crackles  Cardiovascular system: S1 & S2 heard, RRR. No JVD, murmurs, rubs, gallops or clicks. No pedal edema. Gastrointestinal system: Abdomen is nondistended, soft and nontender. No organomegaly or masses felt. Normal bowel sounds heard. Central nervous system: Alert and oriented to self only.  Motor: 4/5 on bilateral upper extremities and 3/5 on bilateral lower extremities .Slow in response extremities: No edema, no clubbing ,no cyanosis, distal peripheral pulses palpable. Skin: No rashes, lesions or ulcers,no icterus ,no pallor MSK: Normal muscle bulk,tone ,power Psychiatry: Judgement and insight appear impaired    Data Reviewed: I have personally reviewed following labs and imaging studies  CBC: Recent Labs  Lab 07/26/17 1700 07/28/17 0415  WBC 8.5 7.3  NEUTROABS 6.3  --   HGB 12.7* 13.1  HCT 38.8* 39.7  MCV 82.4 81.2  PLT 178 967   Basic Metabolic Panel: Recent Labs  Lab 07/26/17 1700 07/27/17 0410 07/28/17 0415  NA 141 143 140  K 4.0 3.7 3.7  CL 108 109 108  CO2 24 26 24   GLUCOSE 139* 132* 119*  BUN 23 20 15   CREATININE 1.50* 1.28* 1.21  CALCIUM 9.0 9.1 9.0   GFR: Estimated Creatinine Clearance: 49.1 mL/min (by C-G formula based on SCr of 1.21 mg/dL). Liver Function Tests: Recent Labs  Lab 07/26/17 1700  AST 18  ALT 12  ALKPHOS 67  BILITOT 0.7  PROT 5.8*  ALBUMIN 3.3*   No results for input(s): LIPASE, AMYLASE in the last 168 hours. No results for input(s): AMMONIA in the last 168 hours. Coagulation Profile: Recent Labs  Lab 07/26/17 1700  INR 1.46   Cardiac Enzymes: Recent Labs  Lab 07/26/17 1700  CKTOTAL 36*   BNP (last 3 results) No results for input(s): PROBNP  in the last 8760 hours. HbA1C: No results for input(s): HGBA1C in the last 72 hours. CBG: Recent Labs  Lab 07/27/17 1638 07/27/17 2014 07/28/17 0109 07/28/17 0406 07/28/17 0754  GLUCAP 125* 114* 109* 115* 119*   Lipid Profile: No results for input(s): CHOL, HDL, LDLCALC, TRIG, CHOLHDL, LDLDIRECT in the last 72 hours. Thyroid Function Tests: Recent Labs    07/27/17 0410  TSH 1.819   Anemia Panel: Recent Labs    07/27/17 0410  VITAMINB12 458   Sepsis Labs: No results for input(s): PROCALCITON, LATICACIDVEN in the last 168 hours.  No results found for this or any previous visit (from the past 240 hour(s)).       Radiology Studies: Ct Head Wo Contrast  Result Date: 07/26/2017 CLINICAL DATA:  Increasing weakness over the past few days. Unable to walk due to weakness. EXAM: CT HEAD WITHOUT CONTRAST TECHNIQUE: Contiguous axial images were obtained from the base of the skull through the vertex without intravenous contrast. COMPARISON:  CT head 06/04/2017. MR head 06/04/2017. FINDINGS: Brain: No definite acute stroke, acute hemorrhage, mass lesion, hydrocephalus, or extra-axial fluid. Advanced cerebral and cerebellar atrophy. Hypoattenuation of white matter, likely small vessel disease. Evolutionary change  of the previously noted acute infarction to subacute/chronic appearance in the RIGHT frontal deep white matter and LEFT internal capsular regions. Continued hypodensity of the brainstem representing chronic ischemia. Vascular: Calcification of the cavernous internal carotid arteries consistent with cerebrovascular atherosclerotic disease. No signs of intracranial large vessel occlusion. Skull: Calvarium intact. Sinuses/Orbits: Clear sinuses. Other: None. IMPRESSION: Advanced atrophy and small vessel disease. No definite acute intracranial findings. Electronically Signed   By: Staci Righter M.D.   On: 07/26/2017 18:07   Ct Chest W Contrast  Result Date: 07/27/2017 CLINICAL DATA:   72 year old with personal history of bladder carcinoma. Restaging evaluation. EXAM: CT CHEST, ABDOMEN, AND PELVIS WITH CONTRAST TECHNIQUE: Multidetector CT imaging of the chest, abdomen and pelvis was performed following the standard protocol during bolus administration of intravenous contrast. CONTRAST:  13mL OMNIPAQUE IOHEXOL 300 MG/ML IV. Oral contrast was also administered. COMPARISON:  CT abdomen and pelvis 05/17/2013, 11/04/2012. CT chest 11/02/2012. FINDINGS: Beam hardening streak artifact is present in the lower chest and upper abdomen as the patient was unable to raise the arms over the head. CT CHEST FINDINGS Cardiovascular: Heart mildly enlarged, increased in size since 2014. Three-vessel coronary artery disease with prior LAD and RIGHT coronary artery stenting. No pericardial effusion. Mild atherosclerosis involving the thoracic aorta without evidence of aneurysm. Central pulmonary arteries widely patent. Mediastinum/Nodes: No pathologically enlarged mediastinal, hilar or axillary lymph nodes. No mediastinal masses. Normal-appearing esophagus. Normal-appearing thyroid gland. Lungs/Pleura: Pleural based nodule involving the RIGHT MIDDLE LOBE adjacent to the major fissure measuring approximately 13 x 7 mm (mean 10 mm), unchanged since the 2014 CT. No new, enlarging or suspicious lung nodules. Emphysematous changes throughout both lungs with peripheral blebs involving the UPPER lobes bilaterally. No confluent airspace consolidation. No pleural effusions. Central airways patent without significant bronchial wall thickening. Musculoskeletal: Degenerative disc disease and spondylosis involving the visualized LOWER cervical spine. 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 Exaggeration of the usual thoracic kyphosis. No acute findings. No evidence of osseous metastatic disease. CT ABDOMEN PELVIS FINDINGS Hepatobiliary: Liver normal in size and appearance. Gallbladder normal in appearance  without calcified gallstones. No biliary ductal dilation. Pancreas: Atrophic. No pancreatic mass or peripancreatic edema/inflammation. Spleen: Splenomegaly, the spleen measuring approximately 11.4 x 6.4 x 15.0 cm, yielding a volume of approximately 547 mL. No focal splenic parenchymal abnormality. Adrenals/Urinary Tract: Normal appearing adrenal glands. Kidneys normal in size and appearance without focal parenchymal abnormality. No hydronephrosis. No evidence of urinary tract calculi. Urinary bladder decompressed without evidence of focal wall thickening or mass. Stomach/Bowel: Stomach decompressed and unremarkable by CT. Normal-appearing small bowel. Diverticulosis involving the cecum, descending colon and sigmoid colon without evidence of acute diverticulitis. Mobile cecum positioned in the RIGHT UPPER QUADRANT. Large amount of stool in the mildly dilated rectum. Normal-appearing short appendix in the RIGHT mid abdomen. Vascular/Lymphatic: Severe aortoiliofemoral atherosclerosis without evidence of aneurysm. No pathologic lymphadenopathy. Reproductive: Upper normal sized prostate gland for age, containing calcifications. Normal seminal vesicles. Other: Small RIGHT inguinal hernia containing fat. Musculoskeletal: Multilevel degenerative disc disease, spondylosis and facet degenerative changes throughout the lumbar spine. No acute findings. No evidence of osseous metastatic disease. IMPRESSION: CT Chest: 1. No acute cardiopulmonary disease. No evidence of metastatic disease in the thorax. 2. Stable benign subpleural RIGHT MIDDLE LOBE nodule dating back to 2010, therefore likely a benign subpleural lymph node. 3. Mild cardiomegaly. Three-vessel coronary artery disease with prior LAD and RIGHT coronary artery stenting. CT Abdomen Pelvis: 1. No acute abnormalities involving the abdomen or pelvis. No  evidence of metastatic disease in the abdomen or pelvis. 2. Colonic diverticulosis without evidence of acute  diverticulitis. 3. Mild splenomegaly, with slight interval increase in splenic size since 2015. Aortic Atherosclerosis (ICD10-I70.0) and Emphysema (ICD10-J43.9). Electronically Signed   By: Evangeline Dakin M.D.   On: 07/27/2017 14:34   Mr Brain Wo Contrast  Result Date: 07/26/2017 CLINICAL DATA:  72 y/o M; history of multiple strokes. Patient presents with 2 days of generalized weakness and gait difficulty. EXAM: MRI HEAD WITHOUT CONTRAST TECHNIQUE: Multiplanar, multiecho pulse sequences of the brain and surrounding structures were obtained without intravenous contrast. COMPARISON:  07/26/2017 CT head.  06/04/2017 MRI head. FINDINGS: Brain: Subcentimeter foci of reduced diffusion are present within the left posteromedial frontal lobe, left parietal white matter, and the right putamen compatible with acute/early subacute infarction. No associated hemorrhage or mass effect. Small chronic infarcts are present within the right cerebellar hemisphere, pons, bilateral thalami, bilateral lentiform nuclei, and bilateral caudate heads. Advanced chronic microvascular ischemic changes of white matter parenchymal volume loss of the brain. No extra-axial collection, hydrocephalus, or herniation. Small foci of susceptibility hypointensity are present scattered throughout white matter as well as in the left putamen and right medulla compatible hemosiderin deposition of chronic microhemorrhage. Vascular: Normal flow voids. Skull and upper cervical spine: Normal marrow signal. Sinuses/Orbits: Negative. Other: None. IMPRESSION: 1. Three subcentimeter foci of acute/early subacute infarction in left frontal and parietal lobes as well as the right putamen. No hemorrhage or mass effect. 2. Advanced chronic microvascular ischemic changes, volume loss, and numerous small chronic infarcts of the brain. These results will be called to the ordering clinician or representative by the Radiologist Assistant, and communication documented in  the PACS or zVision Dashboard. Electronically Signed   By: Kristine Garbe M.D.   On: 07/26/2017 23:05   Ct Abdomen Pelvis W Contrast  Result Date: 07/27/2017 CLINICAL DATA:  72 year old with personal history of bladder carcinoma. Restaging evaluation. EXAM: CT CHEST, ABDOMEN, AND PELVIS WITH CONTRAST TECHNIQUE: Multidetector CT imaging of the chest, abdomen and pelvis was performed following the standard protocol during bolus administration of intravenous contrast. CONTRAST:  168mL OMNIPAQUE IOHEXOL 300 MG/ML IV. Oral contrast was also administered. COMPARISON:  CT abdomen and pelvis 05/17/2013, 11/04/2012. CT chest 11/02/2012. FINDINGS: Beam hardening streak artifact is present in the lower chest and upper abdomen as the patient was unable to raise the arms over the head. CT CHEST FINDINGS Cardiovascular: Heart mildly enlarged, increased in size since 2014. Three-vessel coronary artery disease with prior LAD and RIGHT coronary artery stenting. No pericardial effusion. Mild atherosclerosis involving the thoracic aorta without evidence of aneurysm. Central pulmonary arteries widely patent. Mediastinum/Nodes: No pathologically enlarged mediastinal, hilar or axillary lymph nodes. No mediastinal masses. Normal-appearing esophagus. Normal-appearing thyroid gland. Lungs/Pleura: Pleural based nodule involving the RIGHT MIDDLE LOBE adjacent to the major fissure measuring approximately 13 x 7 mm (mean 10 mm), unchanged since the 2014 CT. No new, enlarging or suspicious lung nodules. Emphysematous changes throughout both lungs with peripheral blebs involving the UPPER lobes bilaterally. No confluent airspace consolidation. No pleural effusions. Central airways patent without significant bronchial wall thickening. Musculoskeletal: Degenerative disc disease and spondylosis involving the visualized LOWER cervical spine. 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 Exaggeration of the usual thoracic  kyphosis. No acute findings. No evidence of osseous metastatic disease. CT ABDOMEN PELVIS FINDINGS Hepatobiliary: Liver normal in size and appearance. Gallbladder normal in appearance without calcified gallstones. No biliary ductal dilation. Pancreas: Atrophic. No pancreatic mass or peripancreatic  edema/inflammation. Spleen: Splenomegaly, the spleen measuring approximately 11.4 x 6.4 x 15.0 cm, yielding a volume of approximately 547 mL. No focal splenic parenchymal abnormality. Adrenals/Urinary Tract: Normal appearing adrenal glands. Kidneys normal in size and appearance without focal parenchymal abnormality. No hydronephrosis. No evidence of urinary tract calculi. Urinary bladder decompressed without evidence of focal wall thickening or mass. Stomach/Bowel: Stomach decompressed and unremarkable by CT. Normal-appearing small bowel. Diverticulosis involving the cecum, descending colon and sigmoid colon without evidence of acute diverticulitis. Mobile cecum positioned in the RIGHT UPPER QUADRANT. Large amount of stool in the mildly dilated rectum. Normal-appearing short appendix in the RIGHT mid abdomen. Vascular/Lymphatic: Severe aortoiliofemoral atherosclerosis without evidence of aneurysm. No pathologic lymphadenopathy. Reproductive: Upper normal sized prostate gland for age, containing calcifications. Normal seminal vesicles. Other: Small RIGHT inguinal hernia containing fat. Musculoskeletal: Multilevel degenerative disc disease, spondylosis and facet degenerative changes throughout the lumbar spine. No acute findings. No evidence of osseous metastatic disease. IMPRESSION: CT Chest: 1. No acute cardiopulmonary disease. No evidence of metastatic disease in the thorax. 2. Stable benign subpleural RIGHT MIDDLE LOBE nodule dating back to 2010, therefore likely a benign subpleural lymph node. 3. Mild cardiomegaly. Three-vessel coronary artery disease with prior LAD and RIGHT coronary artery stenting. CT Abdomen Pelvis:  1. No acute abnormalities involving the abdomen or pelvis. No evidence of metastatic disease in the abdomen or pelvis. 2. Colonic diverticulosis without evidence of acute diverticulitis. 3. Mild splenomegaly, with slight interval increase in splenic size since 2015. Aortic Atherosclerosis (ICD10-I70.0) and Emphysema (ICD10-J43.9). Electronically Signed   By: Evangeline Dakin M.D.   On: 07/27/2017 14:34        Scheduled Meds: . apixaban  5 mg Oral BID  . clopidogrel  75 mg Oral Q breakfast  . ezetimibe  10 mg Oral Daily  . insulin aspart  0-9 Units Subcutaneous Q4H  . levETIRAcetam  500 mg Oral BID  . omega-3 acid ethyl esters  1 g Oral BID  . pantoprazole  40 mg Oral Daily  . pravastatin  20 mg Oral q1800  . sertraline  100 mg Oral Daily   Continuous Infusions:   LOS: 1 day    Time spent: 35 mins.More than 50% of that time was spent in counseling and/or coordination of care.      Shelly Coss, MD [facility] Pager [phone]  If 7PM-7AM, please contact night-coverage [email or web address] Password [email or web address] 07/28/2017, 9:02 AM

## 2017-07-28 NOTE — NC FL2 (Signed)
[place] LEVEL OF CARE SCREENING TOOL     IDENTIFICATION  Patient Name: Carl Stephenson Birthdate: [birth year] Sex: male Admission Date (Current Location): 07/26/2017  [facility] and [place] Number:  [employer] and Address:  [facility] [place], [place] [address] [place], [place] [address]      Provider Number: [id number]  Attending Physician Name and Address:  Shelly Coss, MD  Relative Name and Phone Number:       Current Level of Care: Hospital Recommended Level of Care: [place] Prior Approval Number:    Date Approved/Denied:   PASRR Number: [id number]  Discharge Plan: SNF    Current Diagnoses: Patient Active Problem List   Diagnosis Date Noted  . Cerebral embolism with cerebral infarction 07/27/2017  . Ischemic stroke ([place]) 07/27/2017  . Acute renal failure superimposed on stage 3 chronic kidney disease ([place])   . Ataxia   . CKD (chronic kidney disease), stage III ([place]) 07/26/2017  . Weakness 09/07/2016  . Gait abnormality 09/07/2016  . Chronic combined systolic and diastolic CHF (congestive heart failure) ([place]) 02/18/2016  . History of embolic stroke 58/52/7782  . Bradycardia 01/16/2016  . Abnormal EKG   . LBBB (left bundle branch block)   . Coronary artery disease involving native coronary artery of native heart without angina pectoris   . ECG abnormality   . History of CVA (cerebrovascular accident)   . Seizures ([place])   . Seborrheic dermatitis 10/04/2012  . Aphasia 06/26/2011  . 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 BENIGN NEOPLASM OF BLADDER 03/16/2008  . Hyperlipidemia 03/16/2008  . Obesity 03/16/2008  . Essential hypertension 03/16/2008  . UNSPECIFIED PAROXYSMAL TACHYCARDIA 03/16/2008  . GERD 03/16/2008  . Insulin-requiring or dependent type II diabetes mellitus ([place]) 03/16/2008    Orientation RESPIRATION BLADDER Height & Weight     Self  Normal Incontinent, External  catheter Weight: 164 lb 0.4 oz (74.4 kg) Height:  5' 5.98" (167.6 cm)  BEHAVIORAL SYMPTOMS/MOOD NEUROLOGICAL BOWEL NUTRITION STATUS  (None) Convulsions/Seizures Incontinent Diet(DYS 1. Fluid honey thick. Honey thick by SPOON, medicine crushed in puree, full supervision.)  AMBULATORY STATUS COMMUNICATION OF NEEDS Skin   Extensive Assist Verbally Bruising, Other (Comment)(MASD.)                       Personal Care Assistance Level of Assistance  Bathing, Feeding, Dressing Bathing Assistance: Maximum assistance Feeding assistance: Limited assistance Dressing Assistance: Maximum assistance     Functional Limitations Info  Sight, Hearing, Speech Sight Info: Adequate Hearing Info: Adequate Speech Info: Adequate    SPECIAL CARE FACTORS FREQUENCY  PT (By licensed PT), OT (By licensed OT), Speech therapy     PT Frequency: 5 x week OT Frequency: 5 x week     Speech Therapy Frequency: 5 x week      Contractures Contractures Info: Not present    Additional Factors Info  Code Status, Allergies Code Status Info: Full code Allergies Info: Statins, Atorvastatin, Nitrofurantoin Monohyd Macro.           Current Medications (07/28/2017):  This is the current hospital active medication list Current Facility-Administered Medications  Medication Dose Route Frequency Provider Last Rate Last Dose  . acetaminophen (TYLENOL) tablet 650 mg  650 mg Oral Q4H PRN Opyd, Ilene Qua, MD   650 mg at 07/27/17 1117   Or  . acetaminophen (TYLENOL) solution 650 mg  650 mg Per Tube Q4H PRN Opyd, Ilene Qua, MD  Or  . acetaminophen (TYLENOL) suppository 650 mg  650 mg Rectal Q4H PRN Opyd, Ilene Qua, MD      . apixaban (ELIQUIS) tablet 5 mg  5 mg Oral BID Opyd, Ilene Qua, MD   5 mg at 07/28/17 1118  . clopidogrel (PLAVIX) tablet 75 mg  75 mg Oral Q breakfast Opyd, Ilene Qua, MD   75 mg at 07/28/17 0807  . ezetimibe (ZETIA) tablet 10 mg  10 mg Oral Daily Opyd, Ilene Qua, MD   10 mg at 07/28/17 1118   . insulin aspart (novoLOG) injection 0-9 Units  0-9 Units Subcutaneous Q4H Opyd, Ilene Qua, MD   1 Units at 07/27/17 1721  . levETIRAcetam (KEPPRA) tablet 500 mg  500 mg Oral BID Opyd, Ilene Qua, MD   500 mg at 07/28/17 1118  . omega-3 acid ethyl esters (LOVAZA) capsule 1 g  1 g Oral BID Barton Dubois, MD   1 g at 07/28/17 1118  . pantoprazole (PROTONIX) EC tablet 40 mg  40 mg Oral Daily Opyd, Ilene Qua, MD   40 mg at 07/28/17 1119  . pravastatin (PRAVACHOL) tablet 20 mg  20 mg Oral q1800 Barton Dubois, MD   20 mg at 07/27/17 1721  . senna-docusate (Senokot-S) tablet 1 tablet  1 tablet Oral QHS PRN Opyd, Ilene Qua, MD      . sertraline (ZOLOFT) tablet 100 mg  100 mg Oral Daily Opyd, Ilene Qua, MD   100 mg at 07/28/17 1118   Facility-Administered Medications Ordered in Other Encounters  Medication Dose Route Frequency Provider Last Rate Last Dose  . heparin infusion 2 units/mL in 0.9 % sodium chloride    Continuous PRN [place], Peter M, MD   1,000 mL at 01/15/16 0514  . iopamidol (ISOVUE-370) 76 % injection    PRN [place], Peter M, MD   105 mL at 01/15/16 0514  . lidocaine (PF) (XYLOCAINE) 1 % injection    PRN [place], Peter M, MD   2 mL at 01/15/16 0514  . Radial Cocktail/Verapamil only    PRN [place], Peter M, MD   10 mL at 01/15/16 0451     Discharge Medications: Please see discharge summary for a list of discharge medications.  Relevant Imaging Results:  Relevant Lab Results:   Additional Information SS#: [id number]. Was at [facility] 5/27-6/27.  Candie Chroman, LCSW

## 2017-07-28 NOTE — Plan of Care (Signed)
  Problem: Pain Managment: Goal: General experience of comfort will improve Outcome: Progressing   Problem: Safety: Goal: Ability to remain free from injury will improve Outcome: Progressing   Problem: Skin Integrity: Goal: Risk for impaired skin integrity will decrease Outcome: Progressing   Problem: Ischemic Stroke/TIA Tissue Perfusion: Goal: Complications of ischemic stroke/TIA will be minimized Outcome: Progressing

## 2017-07-28 NOTE — Clinical Social Work Note (Signed)
Clinical Social Work Assessment  Patient Details  Name: Carl Stephenson MRN: [id number] Date of Birth: [birth year]  Date of referral:  07/28/17               Reason for consult:  Facility Placement, Discharge Planning                Permission sought to share information with:  Facility Sport and exercise psychologist, Family Supports Permission granted to share information::     Name::     Nasiah Lehenbauer and [place]::  SNF's  Relationship::  Wife and daughter  Sport and exercise psychologist Information:  Earlie Server: [phone]: [phone]  Housing/Transportation Living arrangements for the past 2 months:  [place], [place] of Information:  Medical Team, Facility, Adult Children, Spouse Patient Interpreter Needed:  None Criminal Activity/Legal Involvement Pertinent to Current Situation/Hospitalization:  No - Comment as needed Significant Relationships:  Adult Children, Spouse Lives with:  Spouse Do you feel safe going back to the place where you live?  Yes Need for family participation in patient care:  Yes (Comment)  Care giving concerns:  PT recommending SNF once medically stable for discharge.   Social Worker assessment / plan:  Patient only oriented to self. No supports at bedside. CSW called patient's wife, introduced role, and explained that PT recommendations would be discussed. Patient's wife is agreeable to SNF. [facility] is first preference. She asked that [place] notify her daughter as well. CSW called patient's daughter and provided same information. She stated patient has been to [facility] multiple times in the past. They are both aware patient is in his copay days but has a secondary insurance that should cover it. CSW also notified them that patient will have to be here until Friday at the earliest before insurance would cover SNF. CSW left voicemail for [employer] admissions coordinator. No further concerns. CSW encouraged patient's wife and  daughter to contact CSW as needed. CSW will continue to follow patient and his family for support and facilitate discharge to SNF once medically stable.  Employment status:  Retired Forensic scientist:  Medicare PT Recommendations:  [place] / Referral to community resources:  [place]  Patient/Family's Response to care:  Patient only oriented to self. Patient's wife and daughter agreeable to SNF placement. Patient's family supportive and involved in patient's care. Patient's wife and daughter appreciated social work intervention.  Patient/Family's Understanding of and Emotional Response to Diagnosis, Current Treatment, and Prognosis:  Patient only oriented to self. Patient's wife and daughter have a good understanding of the reason for admission and his need for rehab prior to returning home. Patient's wife and daughter appear happy with hospital care.  Emotional Assessment Appearance:  Appears stated age Attitude/Demeanor/Rapport:  Unable to Assess Affect (typically observed):  Unable to Assess Orientation:  Oriented to Self Alcohol / Substance use:  Never Used Psych involvement (Current and /or in the community):  No (Comment)  Discharge Needs  Concerns to be addressed:  Care Coordination Readmission within the last 30 days:  No Current discharge risk:  Cognitively Impaired, Dependent with Mobility Barriers to Discharge:  Continued Medical Work up, [employer] inpatient day 1/3.)   Candie Chroman, LCSW 07/28/2017, 3:29 PM

## 2017-07-28 NOTE — Progress Notes (Signed)
Initial Nutrition Assessment  DOCUMENTATION CODES:   Non-severe (moderate) malnutrition in context of chronic illness  INTERVENTION:  Continue Magic cup TID with meals, each supplement provides 290 kcal and 9 grams of protein  Monitor PO, question his ability to meet PO needs in his current state  NUTRITION DIAGNOSIS:   Moderate Malnutrition related to chronic illness as evidenced by moderate fat depletion, mild fat depletion, mild muscle depletion, percent weight loss.  GOAL:   Patient will meet greater than or equal to 90% of their needs  MONITOR:   PO intake, Weight trends, Supplement acceptance  REASON FOR ASSESSMENT:   Malnutrition Screening Tool    ASSESSMENT:   Patient with PMH of CHF, IDDM, CKD III, multiple strokes, presents with generalized weakness, gait difficulty, and poor appetite.   Attempted to speak with patient at bedside but he was unable to provide any history. According to the chart, he had a CVA back in May, was discharged from [facility] on 5/27. He has lost 18 pounds since then.  Prior to this weight loss, he exhibited fluctuations between 185-192 pounds. No documented meal completion at this time. Had a lunch tray at bedside he had not eaten.Will monitor PO intake.  Labs reviewed:  CBGs 109-119 Medications reviewed and include:  Insulin, Omega-3s  NUTRITION - FOCUSED PHYSICAL EXAM:    Most Recent Value  Orbital Region  Moderate depletion  Upper Arm Region  Mild depletion  Thoracic and Lumbar Region  No depletion  Buccal Region  No depletion  Temple Region  Moderate depletion  Clavicle Bone Region  No depletion  Clavicle and Acromion Bone Region  No depletion  Scapular Bone Region  Mild depletion  Dorsal Hand  Mild depletion  Patellar Region  Mild depletion  Anterior Thigh Region  Mild depletion  Posterior Calf Region  Mild depletion  Edema (RD Assessment)  Mild  Hair  Reviewed  Eyes  Reviewed  Mouth  Reviewed  Skin  Reviewed  Nails   Reviewed       Diet Order:   Diet Order           DIET - DYS 1 Room service appropriate? Yes; Fluid consistency: Honey Thick  Diet effective now          EDUCATION NEEDS:   Not appropriate for education at this time  Skin:  Skin Assessment: Reviewed RN Assessment  Last BM:  07/27/2017  Height:   Ht Readings from Last 1 Encounters:  07/27/17 5' 5.98" (1.676 m)    Weight:   Wt Readings from Last 1 Encounters:  07/27/17 164 lb 0.4 oz (74.4 kg)    Ideal Body Weight:  64.54 kg  BMI:  Body mass index is 26.49 kg/m.  Estimated Nutritional Needs:   Kcal:  1700-2000 calories  Protein:  98-115 grams  Fluid:  >1.5L    Satira Anis. Anita Mcadory, MS, RD LDN Inpatient Clinical Dietitian Pager [phone]

## 2017-07-29 ENCOUNTER — Ambulatory Visit (INDEPENDENT_AMBULATORY_CARE_PROVIDER_SITE_OTHER): Payer: Medicare Other | Admitting: *Deleted

## 2017-07-29 ENCOUNTER — Ambulatory Visit: Payer: Medicare Other | Admitting: Neurology

## 2017-07-29 ENCOUNTER — Other Ambulatory Visit: Payer: Self-pay | Admitting: Internal Medicine

## 2017-07-29 DIAGNOSIS — E440 Moderate protein-calorie malnutrition: Secondary | ICD-10-CM

## 2017-07-29 DIAGNOSIS — I6389 Other cerebral infarction: Secondary | ICD-10-CM | POA: Diagnosis not present

## 2017-07-29 LAB — BASIC METABOLIC PANEL
ANION GAP: 9 (ref 5–15)
BUN: 15 mg/dL (ref 8–23)
CALCIUM: 9 mg/dL (ref 8.9–10.3)
CO2: 25 mmol/L (ref 22–32)
Chloride: 108 mmol/L (ref 98–111)
Creatinine, Ser: 1.11 mg/dL (ref 0.61–1.24)
GFR calc Af Amer: >60 mL/min (ref >60)
Glucose, Bld: 118 mg/dL — ABNORMAL HIGH (ref 70–99)
Potassium: 3.7 mmol/L (ref 3.5–5.1)
Sodium: 142 mmol/L (ref 135–145)

## 2017-07-29 LAB — COMPREHENSIVE METABOLIC PANEL
ALK PHOS: 75 U/L (ref 38–126)
ALT: 15 U/L (ref 0–44)
ANION GAP: 9 (ref 5–15)
AST: 21 U/L (ref 15–41)
Albumin: 3.4 g/dL — ABNORMAL LOW (ref 3.5–5.0)
BILIRUBIN TOTAL: 0.9 mg/dL (ref 0.3–1.2)
BUN: 19 mg/dL (ref 8–23)
CALCIUM: 9.1 mg/dL (ref 8.9–10.3)
CO2: 25 mmol/L (ref 22–32)
Chloride: 106 mmol/L (ref 98–111)
Creatinine, Ser: 1.31 mg/dL — ABNORMAL HIGH (ref 0.61–1.24)
GFR calc non Af Amer: 52 mL/min — ABNORMAL LOW (ref >60)
Glucose, Bld: 172 mg/dL — ABNORMAL HIGH (ref 70–99)
Potassium: 3.6 mmol/L (ref 3.5–5.1)
Sodium: 140 mmol/L (ref 135–145)
Total Protein: 5.9 g/dL — ABNORMAL LOW (ref 6.5–8.1)

## 2017-07-29 LAB — GLUCOSE, CAPILLARY
GLUCOSE-CAPILLARY: 111 mg/dL — AB (ref 70–99)
GLUCOSE-CAPILLARY: 137 mg/dL — AB (ref 70–99)
GLUCOSE-CAPILLARY: 134 mg/dL — AB (ref 70–99)
GLUCOSE-CAPILLARY: 165 mg/dL — AB (ref 70–99)
Glucose-Capillary: 113 mg/dL — ABNORMAL HIGH (ref 70–99)
Glucose-Capillary: 165 mg/dL — ABNORMAL HIGH (ref 70–99)

## 2017-07-29 LAB — MAGNESIUM
Magnesium: 2 mg/dL (ref 1.7–2.4)
Magnesium: 2.2 mg/dL (ref 1.7–2.4)

## 2017-07-29 NOTE — Progress Notes (Signed)
Pt began coughing with nectar thick liquids during lunch. Lung sounds clear, o2 sats 96% room air.  Speech therapy notified, will reassess swallow in AM. MD notified.

## 2017-07-29 NOTE — Progress Notes (Signed)
Carelink Summary Report / Loop Recorder 

## 2017-07-29 NOTE — Progress Notes (Signed)
PROGRESS NOTE    Carl Stephenson  GUY:[id number] DOB: 04/30/1945 DOA: 07/26/2017 PCP: Myer Peer, MD   Brief Narrative: Patient is a 72 year old male with past medical history significant for combined systolic/diastolic CHF, insulin-dependent diabetes mellitus, CKD stage III, history of multiple strokes who presented to the emergency department with 2-day history of generalized weakness and gait difficulty.  He was admitted in May with similar symptoms during which new areas of ischemia were noted on his MRI despite dual antiplatelet therapy.  He was discharged home on Eliquis.  Patient found with 3 new small infarcts on repeat MRI this time.  Neurology was following.  Plan is to continue Eliquis and Plavix.  Current plan is to discharge to skilled nursing facility.  Waiting for bed/authorisation.  Stable for discharge as soon as the bed is available.  Assessment & Plan:   Principal Problem:   Weakness Active Problems:   Insulin-requiring or dependent type II diabetes mellitus ([place])   Coronary artery disease involving native coronary artery of native heart without angina pectoris   Seizures (HCC)   Chronic combined systolic and diastolic CHF (congestive heart failure) (HCC)   History of embolic stroke   CKD (chronic kidney disease), stage III (HCC)   Cerebral embolism with cerebral infarction   Ischemic stroke ([place])   Acute renal failure superimposed on stage 3 chronic kidney disease (HCC)   Ataxia   Malnutrition of moderate degree  Acute ischemic stroke: MRI showed acute/subacute subcentimeter infarction in left frontal/parietal lobes and also in right putamen.  Patient already on Eliquis and Plavix.  No hemorrhage or mass-effect seen.  Neurology was following.  Patient had recent extensive work-up including echo.  Neurology also recommended CT chest/abdomen/pelvis in order to rule out underlying malignancy which could be responsible for increased viscosity and embolic source but  the imagings did not show any significant findings. Physical therapy was initially recommending CIR but the recommendation has been changed to skilled nursing facility now.  Social worker following. Patient will follow with [facility] neurology as an outpatient after discharge.  Coronary artery disease: Denies any chest pain.  Stable.  Continue Plavix  Hyperlipidemia: Started on low-dose Pravachol and Zetia  .  Insulin dependent diabetes type 2: Last hemoglobin A1c 5.8.  Continue sliding-scale insulin and long-acting insulin.  Hypertension: On lisinopril and Lasix at home.  CKD stage III: At baseline.  Hold nephrotoxic agents.  History of seizure: No seizure activity noted during this admission.  Continue Keppra  History of GERD: Continue PPI  Depression: Continue Zoloft  Chronic dysphagia:Associated with stroke. Speech therapy evaluated the patient and recommended dysphagia 1, honey thick liquid.    DVT prophylaxis: eliquis Code Status: Full Family Communication: Discussed with the daughter at the bedside Disposition Plan: Skilled nursing facility as soon as the bed is available.Waiting for PASRR   Consultants: Neurology  Procedures: None  Antimicrobials: None  Subjective: Patient seen and examined the bedside this morning.  Remains comfortable.  There are no  new issues/events.  Daughter present at the bedside.    Objective: Vitals:   07/28/17 1546 07/28/17 1954 07/28/17 2324 07/29/17 0338  BP:  (!) 152/91 (!) 147/84 (!) 148/73  Pulse:  62 60 (!) 54  Resp:  18 16 18   Temp: 98 F (36.7 C) 98.2 F (36.8 C) 98.1 F (36.7 C) 98 F (36.7 C)  TempSrc: Oral Oral Oral Oral  SpO2:  97% 96% 97%  Weight:      Height:  Intake/Output Summary (Last 24 hours) at 07/29/2017 0846 Last data filed at 07/29/2017 0556 Gross per 24 hour  Intake -  Output 400 ml  Net -400 ml   Filed Weights   07/26/17 2110 07/27/17 0132  Weight: 74.4 kg (164 lb 0.4 oz) 74.4 kg (164 lb 0.4  oz)    Examination:  General exam: Appears calm and comfortable ,Not in distress,average built HEENT:PERRL,Oral mucosa moist, Ear/Nose normal on gross exam, left facial droop Respiratory system: Bilateral equal air entry, normal vesicular breath sounds, no wheezes or crackles  Cardiovascular system: S1 & S2 heard, RRR. No JVD, murmurs, rubs, gallops or clicks. No pedal edema. Gastrointestinal system: Abdomen is nondistended, soft and nontender. No organomegaly or masses felt. Normal bowel sounds heard. Central nervous system: Alert and oriented to self only.  Motor: 4/5 on bilateral upper extremities and 3/5 on bilateral lower extremities .Slow in response extremities: No edema, no clubbing ,no cyanosis, distal peripheral pulses palpable. Skin: No rashes, lesions or ulcers,no icterus ,no pallor Psychiatry: Judgement and insight appear impaired  Data Reviewed: I have personally reviewed following labs and imaging studies  CBC: Recent Labs  Lab 07/26/17 1700 07/27/17 0410 07/28/17 0415  WBC 8.5  --  7.3  NEUTROABS 6.3  --   --   HGB 12.7*  --  13.1  HCT 38.8* 38.4 39.7  MCV 82.4  --  81.2  PLT 178  --  124   Basic Metabolic Panel: Recent Labs  Lab 07/26/17 1700 07/27/17 0410 07/28/17 0415 07/29/17 0412  NA 141 143 140 142  K 4.0 3.7 3.7 3.7  CL 108 109 108 108  CO2 24 26 24 25   GLUCOSE 139* 132* 119* 118*  BUN 23 20 15 15   CREATININE 1.50* 1.28* 1.21 1.11  CALCIUM 9.0 9.1 9.0 9.0  MG  --   --   --  2.0   GFR: Estimated Creatinine Clearance: 53.5 mL/min (by C-G formula based on SCr of 1.11 mg/dL). Liver Function Tests: Recent Labs  Lab 07/26/17 1700  AST 18  ALT 12  ALKPHOS 67  BILITOT 0.7  PROT 5.8*  ALBUMIN 3.3*   No results for input(s): LIPASE, AMYLASE in the last 168 hours. No results for input(s): AMMONIA in the last 168 hours. Coagulation Profile: Recent Labs  Lab 07/26/17 1700  INR 1.46   Cardiac Enzymes: Recent Labs  Lab 07/26/17 1700    CKTOTAL 36*   BNP (last 3 results) No results for input(s): PROBNP in the last 8760 hours. HbA1C: No results for input(s): HGBA1C in the last 72 hours. CBG: Recent Labs  Lab 07/28/17 1603 07/28/17 1952 07/28/17 2328 07/29/17 0335 07/29/17 0803  GLUCAP 90 129* 119* 111* 113*   Lipid Profile: No results for input(s): CHOL, HDL, LDLCALC, TRIG, CHOLHDL, LDLDIRECT in the last 72 hours. Thyroid Function Tests: Recent Labs    07/27/17 0410  TSH 1.819   Anemia Panel: Recent Labs    07/27/17 0410  VITAMINB12 458   Sepsis Labs: No results for input(s): PROCALCITON, LATICACIDVEN in the last 168 hours.  No results found for this or any previous visit (from the past 240 hour(s)).       Radiology Studies: Ct Chest W Contrast  Result Date: 07/27/2017 CLINICAL DATA:  72 year old with personal history of bladder carcinoma. Restaging evaluation. EXAM: CT CHEST, ABDOMEN, AND PELVIS WITH CONTRAST TECHNIQUE: Multidetector CT imaging of the chest, abdomen and pelvis was performed following the standard protocol during bolus administration of intravenous contrast. CONTRAST:  158mL OMNIPAQUE IOHEXOL 300 MG/ML IV. Oral contrast was also administered. COMPARISON:  CT abdomen and pelvis 05/17/2013, 11/04/2012. CT chest 11/02/2012. FINDINGS: Beam hardening streak artifact is present in the lower chest and upper abdomen as the patient was unable to raise the arms over the head. CT CHEST FINDINGS Cardiovascular: Heart mildly enlarged, increased in size since 2014. Three-vessel coronary artery disease with prior LAD and RIGHT coronary artery stenting. No pericardial effusion. Mild atherosclerosis involving the thoracic aorta without evidence of aneurysm. Central pulmonary arteries widely patent. Mediastinum/Nodes: No pathologically enlarged mediastinal, hilar or axillary lymph nodes. No mediastinal masses. Normal-appearing esophagus. Normal-appearing thyroid gland. Lungs/Pleura: Pleural based nodule  involving the RIGHT MIDDLE LOBE adjacent to the major fissure measuring approximately 13 x 7 mm (mean 10 mm), unchanged since the 2014 CT. No new, enlarging or suspicious lung nodules. Emphysematous changes throughout both lungs with peripheral blebs involving the UPPER lobes bilaterally. No confluent airspace consolidation. No pleural effusions. Central airways patent without significant bronchial wall thickening. Musculoskeletal: Degenerative disc disease and spondylosis involving the visualized LOWER cervical spine. 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 Exaggeration of the usual thoracic kyphosis. No acute findings. No evidence of osseous metastatic disease. CT ABDOMEN PELVIS FINDINGS Hepatobiliary: Liver normal in size and appearance. Gallbladder normal in appearance without calcified gallstones. No biliary ductal dilation. Pancreas: Atrophic. No pancreatic mass or peripancreatic edema/inflammation. Spleen: Splenomegaly, the spleen measuring approximately 11.4 x 6.4 x 15.0 cm, yielding a volume of approximately 547 mL. No focal splenic parenchymal abnormality. Adrenals/Urinary Tract: Normal appearing adrenal glands. Kidneys normal in size and appearance without focal parenchymal abnormality. No hydronephrosis. No evidence of urinary tract calculi. Urinary bladder decompressed without evidence of focal wall thickening or mass. Stomach/Bowel: Stomach decompressed and unremarkable by CT. Normal-appearing small bowel. Diverticulosis involving the cecum, descending colon and sigmoid colon without evidence of acute diverticulitis. Mobile cecum positioned in the RIGHT UPPER QUADRANT. Large amount of stool in the mildly dilated rectum. Normal-appearing short appendix in the RIGHT mid abdomen. Vascular/Lymphatic: Severe aortoiliofemoral atherosclerosis without evidence of aneurysm. No pathologic lymphadenopathy. Reproductive: Upper normal sized prostate gland for age, containing calcifications.  Normal seminal vesicles. Other: Small RIGHT inguinal hernia containing fat. Musculoskeletal: Multilevel degenerative disc disease, spondylosis and facet degenerative changes throughout the lumbar spine. No acute findings. No evidence of osseous metastatic disease. IMPRESSION: CT Chest: 1. No acute cardiopulmonary disease. No evidence of metastatic disease in the thorax. 2. Stable benign subpleural RIGHT MIDDLE LOBE nodule dating back to 2010, therefore likely a benign subpleural lymph node. 3. Mild cardiomegaly. Three-vessel coronary artery disease with prior LAD and RIGHT coronary artery stenting. CT Abdomen Pelvis: 1. No acute abnormalities involving the abdomen or pelvis. No evidence of metastatic disease in the abdomen or pelvis. 2. Colonic diverticulosis without evidence of acute diverticulitis. 3. Mild splenomegaly, with slight interval increase in splenic size since 2015. Aortic Atherosclerosis (ICD10-I70.0) and Emphysema (ICD10-J43.9). Electronically Signed   By: Evangeline Dakin M.D.   On: 07/27/2017 14:34   Ct Abdomen Pelvis W Contrast  Result Date: 07/27/2017 CLINICAL DATA:  72 year old with personal history of bladder carcinoma. Restaging evaluation. EXAM: CT CHEST, ABDOMEN, AND PELVIS WITH CONTRAST TECHNIQUE: Multidetector CT imaging of the chest, abdomen and pelvis was performed following the standard protocol during bolus administration of intravenous contrast. CONTRAST:  162mL OMNIPAQUE IOHEXOL 300 MG/ML IV. Oral contrast was also administered. COMPARISON:  CT abdomen and pelvis 05/17/2013, 11/04/2012. CT chest 11/02/2012. FINDINGS: Beam hardening streak artifact is present in the lower chest  and upper abdomen as the patient was unable to raise the arms over the head. CT CHEST FINDINGS Cardiovascular: Heart mildly enlarged, increased in size since 2014. Three-vessel coronary artery disease with prior LAD and RIGHT coronary artery stenting. No pericardial effusion. Mild atherosclerosis involving  the thoracic aorta without evidence of aneurysm. Central pulmonary arteries widely patent. Mediastinum/Nodes: No pathologically enlarged mediastinal, hilar or axillary lymph nodes. No mediastinal masses. Normal-appearing esophagus. Normal-appearing thyroid gland. Lungs/Pleura: Pleural based nodule involving the RIGHT MIDDLE LOBE adjacent to the major fissure measuring approximately 13 x 7 mm (mean 10 mm), unchanged since the 2014 CT. No new, enlarging or suspicious lung nodules. Emphysematous changes throughout both lungs with peripheral blebs involving the UPPER lobes bilaterally. No confluent airspace consolidation. No pleural effusions. Central airways patent without significant bronchial wall thickening. Musculoskeletal: Degenerative disc disease and spondylosis involving the visualized LOWER cervical spine. 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 Exaggeration of the usual thoracic kyphosis. No acute findings. No evidence of osseous metastatic disease. CT ABDOMEN PELVIS FINDINGS Hepatobiliary: Liver normal in size and appearance. Gallbladder normal in appearance without calcified gallstones. No biliary ductal dilation. Pancreas: Atrophic. No pancreatic mass or peripancreatic edema/inflammation. Spleen: Splenomegaly, the spleen measuring approximately 11.4 x 6.4 x 15.0 cm, yielding a volume of approximately 547 mL. No focal splenic parenchymal abnormality. Adrenals/Urinary Tract: Normal appearing adrenal glands. Kidneys normal in size and appearance without focal parenchymal abnormality. No hydronephrosis. No evidence of urinary tract calculi. Urinary bladder decompressed without evidence of focal wall thickening or mass. Stomach/Bowel: Stomach decompressed and unremarkable by CT. Normal-appearing small bowel. Diverticulosis involving the cecum, descending colon and sigmoid colon without evidence of acute diverticulitis. Mobile cecum positioned in the RIGHT UPPER QUADRANT. Large amount of  stool in the mildly dilated rectum. Normal-appearing short appendix in the RIGHT mid abdomen. Vascular/Lymphatic: Severe aortoiliofemoral atherosclerosis without evidence of aneurysm. No pathologic lymphadenopathy. Reproductive: Upper normal sized prostate gland for age, containing calcifications. Normal seminal vesicles. Other: Small RIGHT inguinal hernia containing fat. Musculoskeletal: Multilevel degenerative disc disease, spondylosis and facet degenerative changes throughout the lumbar spine. No acute findings. No evidence of osseous metastatic disease. IMPRESSION: CT Chest: 1. No acute cardiopulmonary disease. No evidence of metastatic disease in the thorax. 2. Stable benign subpleural RIGHT MIDDLE LOBE nodule dating back to 2010, therefore likely a benign subpleural lymph node. 3. Mild cardiomegaly. Three-vessel coronary artery disease with prior LAD and RIGHT coronary artery stenting. CT Abdomen Pelvis: 1. No acute abnormalities involving the abdomen or pelvis. No evidence of metastatic disease in the abdomen or pelvis. 2. Colonic diverticulosis without evidence of acute diverticulitis. 3. Mild splenomegaly, with slight interval increase in splenic size since 2015. Aortic Atherosclerosis (ICD10-I70.0) and Emphysema (ICD10-J43.9). Electronically Signed   By: Evangeline Dakin M.D.   On: 07/27/2017 14:34        Scheduled Meds: . apixaban  5 mg Oral BID  . clopidogrel  75 mg Oral Q breakfast  . ezetimibe  10 mg Oral Daily  . insulin aspart  0-9 Units Subcutaneous Q4H  . levETIRAcetam  500 mg Oral BID  . omega-3 acid ethyl esters  1 g Oral BID  . pantoprazole  40 mg Oral Daily  . pravastatin  20 mg Oral q1800  . sertraline  100 mg Oral Daily   Continuous Infusions:   LOS: 2 days    Time spent: 25 mins.More than 50% of that time was spent in counseling and/or coordination of care.      Shelly Coss,  MD [facility] Pager [phone]  If 7PM-7AM, please contact  night-coverage [email or web address] Password [facility] 07/29/2017, 8:46 AM

## 2017-07-29 NOTE — Progress Notes (Signed)
RN noticed that pt's PVC's are more frequent and Ventricular Trigeminy alarm going off consistently in room. RN called CMT to confirm. Dr. Kennon Holter notified and stated she will put in labs. Pt denies chest pain, dizziness.

## 2017-07-29 NOTE — Progress Notes (Signed)
Physical Therapy Treatment Patient Details Name: Carl Stephenson MRN: [id number] DOB: [birth year] Today's Date: 07/29/2017    History of Present Illness Patient is a 72 y/o male presenting with generalized weakness and gait difficulty. Noncontrast head CT is negative for definite acute finding. PMH significant for chronic combined systolic/diastolic CHF, insulin-dependent diabetes mellitus, chronic kidney disease stage III, and history of multiple strokes.  Continued neuro work-up.    PT Comments    Patient is making gradual progress toward PT goals. Pt tolerated increased activity tolerance this session and vitals WNL. Pt requires mod/max A +2 for mobility. Continue to progress as tolerated with anticipated d/c to SNF for further skilled PT services.     Follow Up Recommendations  SNF     Equipment Recommendations  None recommended by PT    Recommendations for Other Services       Precautions / Restrictions Precautions Precautions: Fall Restrictions Weight Bearing Restrictions: No    Mobility  Bed Mobility Overal bed mobility: Needs Assistance Bed Mobility: Supine to Sit     Supine to sit: Max assist;+2 for physical assistance     General bed mobility comments: Pt able to assist with advancing BLE to EOB with multimodal cues provided. Assist posteriorly for trunk elevation. Ultilized bed pad.   Transfers Overall transfer level: Needs assistance Equipment used: Rolling walker (2 wheeled) Transfers: Sit to/from [name] Sit to Stand: Mod assist;+2 physical assistance Stand pivot transfers: Mod assist;+2 physical assistance       General transfer comment: assist to power up into standing, maintain balance in standing, and to manage RW  Ambulation/Gait Ambulation/Gait assistance: Mod assist;+2 physical assistance   Assistive device: Rolling walker (2 wheeled) Gait Pattern/deviations: Step-to pattern;Decreased step length - right;Decreased step  length - left;Trunk flexed     General Gait Details: pt required step by step cues for sequencing and assistance to guide RW and for balance; pt able to take steps toward window from EOB (~63ft) and then pivot to recliner   Stairs             Wheelchair Mobility    Modified Rankin (Stroke Patients Only)       Balance Overall balance assessment: Needs assistance Sitting-balance support: Feet supported;Bilateral upper extremity supported;Single extremity supported Sitting balance-[name]: Poor Sitting balance - Comments: Pt mod to min guard A. 1 sudden LOB requiring max A to correct. Fatigues quickly.   Standing balance support: Bilateral upper extremity supported;During functional activity Standing balance-[name]: Poor Standing balance comment: relies on external assistance                            Cognition Arousal/Alertness: Awake/alert Behavior During Therapy: Flat affect Overall Cognitive Status: Impaired/Different from baseline Area of Impairment: Orientation;Attention;Following commands;Safety/judgement;Problem solving                 Orientation Level: Time;Situation("hospital") Current Attention Level: Sustained   Following Commands: Follows one step commands with increased time Safety/Judgement: Decreased awareness of safety;Decreased awareness of deficits   Problem Solving: Slow processing;Decreased initiation;Difficulty sequencing;Requires verbal cues;Requires tactile cues        Exercises      General Comments General comments (skin integrity, edema, etc.): Pt had BM upon last standing trial       Pertinent Vitals/Pain Pain Assessment: No/denies pain    Home Living  Prior Function            PT Goals (current goals can now be found in the care plan section) Progress towards PT goals: Progressing toward goals    Frequency    Min 3X/week      PT Plan Current plan remains  appropriate;Frequency needs to be updated    Co-evaluation PT/OT/SLP Co-Evaluation/Treatment: Yes Reason for Co-Treatment: Complexity of the patient's impairments (multi-system involvement);For patient/therapist safety;To address functional/ADL transfers PT goals addressed during session: Mobility/safety with mobility;Proper use of DME;Balance OT goals addressed during session: ADL's and self-care      AM-PAC PT "6 Clicks" Daily Activity  Outcome Measure  Difficulty turning over in bed (including adjusting bedclothes, sheets and blankets)?: Unable Difficulty moving from lying on back to sitting on the side of the bed? : Unable Difficulty sitting down on and standing up from a chair with arms (e.g., wheelchair, bedside commode, etc,.)?: Unable Help needed moving to and from a bed to chair (including a wheelchair)?: A Lot Help needed walking in hospital room?: A Lot Help needed climbing 3-5 steps with a railing? : Total 6 Click Score: 8    End of Session Equipment Utilized During Treatment: Gait belt Activity Tolerance: Patient tolerated treatment well Patient left: in chair;with call bell/phone within reach;with chair alarm set Nurse Communication: Mobility status PT Visit Diagnosis: Other abnormalities of gait and mobility (R26.89);Muscle weakness (generalized) (M62.81);Other symptoms and signs involving the nervous system (R29.898)     Time: 9191-6606 PT Time Calculation (min) (ACUTE ONLY): 37 min  Charges:  $Gait Training: 8-22 mins $Therapeutic Activity: 8-22 mins                    G Codes:       Earney Navy, PTA Pager: [phone]     Darliss Cheney 07/29/2017, 1:03 PM

## 2017-07-29 NOTE — Progress Notes (Signed)
Dr. Neena Rhymes notified of pt's more frequent PVC's, CMT's report of PVC run of 3 beats; and trigeminy since the beginning of shift. No new orders at this time.

## 2017-07-29 NOTE — Clinical Social Work Note (Addendum)
No response from [facility]. CSW left another voicemail for admissions coordinator.  Dayton Scrape, CSW [phone]  1:58 pm [facility] declined bed offer. [facility] offered a bed. [facility] still pending. CSW called patient's daughter to notify. She will discuss with her mother and call CSW back.  Dayton Scrape, [place] [phone]  4:07 pm Received call back from patient's daughter. She expressed interest in [facility] (previously called [facility] and [facility]. CSW spoke with admissions coordinator for [facility]. He will review referral and let CSW know answer.  Dayton Scrape, [place]  5:17 pm [facility] is able to offer a bed. Patient's daughter has an appt to tour the facility tomorrow at 8:00 am. She will call CSW with decision.  Dayton Scrape, [place]

## 2017-07-29 NOTE — Telephone Encounter (Signed)
Spoke with patient's wife regarding sending transmission. Patient's wife states patient is currently admitted with recurrent CVA on Monday 07/26/17 and unsure when he will be discharged. She states he will be moving to a SNF at discharge and inquired about taking the Carelink monitor. Advised to take the Carelink monitor to the SNF when he is discharged and gave [facility] phone number to call so we can advise on sending a manual transmission. Patient's wife verbalizes understanding and appreciation.

## 2017-07-29 NOTE — Progress Notes (Signed)
Occupational Therapy Treatment Patient Details Name: Carl Stephenson MRN: [id number] DOB: 04/12/1945 Today's Date: 07/29/2017    History of present illness Patient is a 72 y/o male presenting with generalized weakness and gait difficulty. Noncontrast head CT is negative for definite acute finding. PMH significant for chronic combined systolic/diastolic CHF, insulin-dependent diabetes mellitus, chronic kidney disease stage III, and history of multiple strokes.  Continued neuro work-up.   OT comments  Pt progressing towards acute OT goals. Focus of session was functional transfers and mobility. Pt tolerated session well, seated rest break incorporated into session prior to ambulation. D/c recommendation remains appropriate from OT standpoint but noted from chart review plan is SNF.    Follow Up Recommendations  CIR    Equipment Recommendations  Other (comment)(TBD at next venue of care)    Recommendations for Other Services      Precautions / Restrictions Precautions Precautions: Fall Restrictions Weight Bearing Restrictions: No       Mobility Bed Mobility Overal bed mobility: Needs Assistance Bed Mobility: Supine to Sit     Supine to sit: Max assist;+2 for physical assistance     General bed mobility comments: Pt able to assist with advancing BLE to EOB with multimodal cues provided. Assist posteriorly for trunk elevation. Ultilized bed pad.   Transfers Overall transfer level: Needs assistance Equipment used: Rolling walker (2 wheeled) Transfers: Sit to/from [name] Sit to Stand: Mod assist;+2 physical assistance Stand pivot transfers: Mod assist;+2 physical assistance       General transfer comment: assist to powerup into standing. Narrow BOS. Better on second attempt. Verbal cues for sequencing pivotal steps.    Balance Overall balance assessment: Needs assistance Sitting-balance support: Feet supported;Bilateral upper extremity  supported;Single extremity supported Sitting balance-[name]: Poor Sitting balance - Comments: Pt mod to min guard A. 1 sudden LOB requiring max A to correct. Fatigues quickly.   Standing balance support: Bilateral upper extremity supported;During functional activity Standing balance-[name]: Poor Standing balance comment: relies on external assistance                           ADL either performed or assessed with clinical judgement   ADL Overall ADL's : Needs assistance/impaired                         Toilet Transfer: Moderate assistance;Maximal assistance;Stand-pivot;BSC;RW Armed forces technical officer Details (indicate cue type and reason): simulated EOB>recliner Toileting- Clothing Manipulation and Hygiene: Maximal assistance;+2 for physical assistance;Sit to/from stand Toileting - Clothing Manipulation Details (indicate cue type and reason): pt had bowel movement in standing. Pt stood with +1 assist while second person completed pericare.       General ADL Comments: Pt completed bed mobility, sat EOB 1-2 minutes (quickly fatigued). Pt walked from EOB to window, fatigued and chair brought to pt. SPT EOB>recliner.     Vision       Perception     Praxis      Cognition Arousal/Alertness: Awake/alert Behavior During Therapy: Flat affect Overall Cognitive Status: Impaired/Different from baseline Area of Impairment: Orientation;Attention;Following commands;Safety/judgement;Problem solving                 Orientation Level: Time;Situation("hospital") Current Attention Level: Sustained   Following Commands: Follows one step commands with increased time Safety/Judgement: Decreased awareness of safety;Decreased awareness of deficits   Problem Solving: Slow processing;Decreased initiation;Difficulty sequencing;Requires verbal cues;Requires tactile cues  Exercises     Shoulder Instructions       General Comments Pt had bowel movement  during session.     Pertinent Vitals/ Pain       Pain Assessment: No/denies pain  Home Living                                          Prior Functioning/Environment              Frequency  Min 2X/week        Progress Toward Goals  OT Goals(current goals can now be found in the care plan section)  Progress towards OT goals: Progressing toward goals  Acute Rehab OT Goals OT Goal Formulation: With patient/family Time For Goal Achievement: 08/10/17 Potential to Achieve Goals: Good ADL Goals Pt Will Perform Eating: with supervision;with adaptive utensils Pt Will Perform Grooming: with supervision;sitting Pt Will Perform Upper Body Bathing: with min assist;sitting Pt Will Perform Lower Body Bathing: with min assist;sit to/from stand Pt Will Transfer to Toilet: with supervision;bedside commode;ambulating Pt Will Perform Toileting - Clothing Manipulation and hygiene: with min assist;sit to/from stand Additional ADL Goal #1: Pt will demonstrate selective attention to task in a minimally distracting environment during grooming tasks.  Plan Discharge plan remains appropriate    Co-evaluation    PT/OT/SLP Co-Evaluation/Treatment: Yes Reason for Co-Treatment: Complexity of the patient's impairments (multi-system involvement);For patient/therapist safety;To address functional/ADL transfers   OT goals addressed during session: ADL's and self-care      AM-PAC PT "6 Clicks" Daily Activity     Outcome Measure   Help from another person eating meals?: A Lot Help from another person taking care of personal grooming?: A Lot Help from another person toileting, which includes using toliet, bedpan, or urinal?: A Lot Help from another person bathing (including washing, rinsing, drying)?: A Lot Help from another person to put on and taking off regular upper body clothing?: A Lot Help from another person to put on and taking off regular lower body clothing?: Total 6  Click Score: 11    End of Session Equipment Utilized During Treatment: Rolling walker  OT Visit Diagnosis: Unsteadiness on feet (R26.81);Muscle weakness (generalized) (M62.81);Hemiplegia and hemiparesis Hemiplegia - Right/Left: Left Hemiplegia - dominant/non-dominant: Non-Dominant Hemiplegia - caused by: Cerebral infarction   Activity Tolerance Patient tolerated treatment well   Patient Left in chair;with call bell/phone within reach;with chair alarm set   Nurse Communication          Time: 2010-0712 OT Time Calculation (min): 32 min  Charges: OT General Charges $OT Visit: 1 Visit OT Treatments $Self Care/Home Management : 8-22 mins  Hortencia Pilar 07/29/2017, 12:36 PM

## 2017-07-30 ENCOUNTER — Inpatient Hospital Stay (HOSPITAL_COMMUNITY): Payer: Medicare Other

## 2017-07-30 DIAGNOSIS — R1312 Dysphagia, oropharyngeal phase: Secondary | ICD-10-CM | POA: Diagnosis not present

## 2017-07-30 DIAGNOSIS — K219 Gastro-esophageal reflux disease without esophagitis: Secondary | ICD-10-CM | POA: Diagnosis not present

## 2017-07-30 DIAGNOSIS — I6340 Cerebral infarction due to embolism of unspecified cerebral artery: Secondary | ICD-10-CM | POA: Diagnosis not present

## 2017-07-30 DIAGNOSIS — G4089 Other seizures: Secondary | ICD-10-CM | POA: Diagnosis not present

## 2017-07-30 DIAGNOSIS — R279 Unspecified lack of coordination: Secondary | ICD-10-CM | POA: Diagnosis not present

## 2017-07-30 DIAGNOSIS — R2689 Other abnormalities of gait and mobility: Secondary | ICD-10-CM | POA: Diagnosis not present

## 2017-07-30 DIAGNOSIS — I6930 Unspecified sequelae of cerebral infarction: Secondary | ICD-10-CM | POA: Diagnosis not present

## 2017-07-30 DIAGNOSIS — R5383 Other fatigue: Secondary | ICD-10-CM | POA: Diagnosis not present

## 2017-07-30 DIAGNOSIS — Z743 Need for continuous supervision: Secondary | ICD-10-CM | POA: Diagnosis not present

## 2017-07-30 DIAGNOSIS — R278 Other lack of coordination: Secondary | ICD-10-CM | POA: Diagnosis not present

## 2017-07-30 DIAGNOSIS — I10 Essential (primary) hypertension: Secondary | ICD-10-CM | POA: Diagnosis not present

## 2017-07-30 DIAGNOSIS — M6281 Muscle weakness (generalized): Secondary | ICD-10-CM | POA: Diagnosis not present

## 2017-07-30 DIAGNOSIS — E1159 Type 2 diabetes mellitus with other circulatory complications: Secondary | ICD-10-CM | POA: Diagnosis not present

## 2017-07-30 DIAGNOSIS — I5042 Chronic combined systolic (congestive) and diastolic (congestive) heart failure: Secondary | ICD-10-CM | POA: Diagnosis not present

## 2017-07-30 DIAGNOSIS — N183 Chronic kidney disease, stage 3 (moderate): Secondary | ICD-10-CM | POA: Diagnosis not present

## 2017-07-30 DIAGNOSIS — I2510 Atherosclerotic heart disease of native coronary artery without angina pectoris: Secondary | ICD-10-CM | POA: Diagnosis not present

## 2017-07-30 DIAGNOSIS — E119 Type 2 diabetes mellitus without complications: Secondary | ICD-10-CM | POA: Diagnosis not present

## 2017-07-30 DIAGNOSIS — R41841 Cognitive communication deficit: Secondary | ICD-10-CM | POA: Diagnosis not present

## 2017-07-30 DIAGNOSIS — R2681 Unsteadiness on feet: Secondary | ICD-10-CM | POA: Diagnosis not present

## 2017-07-30 DIAGNOSIS — E440 Moderate protein-calorie malnutrition: Secondary | ICD-10-CM | POA: Diagnosis not present

## 2017-07-30 DIAGNOSIS — R531 Weakness: Secondary | ICD-10-CM | POA: Diagnosis not present

## 2017-07-30 LAB — GLUCOSE, CAPILLARY
GLUCOSE-CAPILLARY: 136 mg/dL — AB (ref 70–99)
GLUCOSE-CAPILLARY: 152 mg/dL — AB (ref 70–99)
Glucose-Capillary: 117 mg/dL — ABNORMAL HIGH (ref 70–99)
Glucose-Capillary: 156 mg/dL — ABNORMAL HIGH (ref 70–99)

## 2017-07-30 MED ORDER — PRAVASTATIN SODIUM 20 MG PO TABS: 20.0000 mg | ORAL_TABLET | Freq: Every day | ORAL | 0 refills | Status: AC

## 2017-07-30 NOTE — Clinical Social Work Placement (Signed)
   CLINICAL SOCIAL WORK PLACEMENT  NOTE  Date:  07/30/2017  Patient Details  Name: Carl Stephenson MRN: [id number] Date of Birth: 05-10-1945  Clinical Social Work is seeking post-discharge placement for this patient at the [employer] level of care (*CSW will initial, date and re-position this form in  chart as items are completed):  Yes   Patient/family provided with [facility] Work Department's list of facilities offering this level of care within the geographic area requested by the patient (or if unable, by the patient's family).  Yes   Patient/family informed of their freedom to choose among providers that offer the needed level of care, that participate in Medicare, Medicaid or managed care program needed by the patient, have an available bed and are willing to accept the patient.  Yes   Patient/family informed of [facility]'s ownership interest in [facility] and [facility], as well as of the fact that they are under no obligation to receive care at these facilities.  PASRR submitted to EDS on 07/28/17     PASRR number received on       Existing PASRR number confirmed on 07/28/17     FL2 transmitted to all facilities in geographic area requested by pt/family on 07/28/17     FL2 transmitted to all facilities within larger geographic area on       Patient informed that his/her managed care company has contracts with or will negotiate with certain facilities, including the following:        Yes   Patient/family informed of bed offers received.  Patient chooses bed at [facility]     Physician recommends and patient chooses bed at      Patient to be transferred to [facility] on 07/30/17.  Patient to be transferred to facility by [facility]     Patient family notified on 07/30/17 of transfer.  Name of family member notified:  Angelia Mould     PHYSICIAN Please prepare prescriptions     Additional  Comment:    _______________________________________________ Candie Chroman, LCSW 07/30/2017, 10:52 AM

## 2017-07-30 NOTE — Clinical Social Work Note (Signed)
CSW facilitated patient discharge including contacting patient family and facility to confirm patient discharge plans. Clinical information faxed to facility and family agreeable with plan. CSW arranged ambulance transport via [facility] to [facility]. RN to call report prior to discharge ([phone]).  CSW will sign off for now as social work intervention is no longer needed. Please consult us again if new needs arise.  Kristene Liberati, CSW [phone]   

## 2017-07-30 NOTE — Consult Note (Signed)
   [facility] CM Inpatient Consult   07/30/2017  JOANN KULPA 1945/03/08 [id number]  Patient screened for [facility] services in [facility] plan. Chart review from inpatient social worker that patient lives in [place] prior to admission and now for skilled nursing facility at [facility] in [place] for transition there today noted.  No community care management needs noted at current  Patient has a low score for unplanned re-admission [14%].  For questions contact:   Natividad Brood, RN BSN [facility] Liaison  [phone] business mobile phone Toll free office [phone]

## 2017-07-30 NOTE — Discharge Summary (Signed)
Physician Discharge Summary  Carl Stephenson EQA:[id number] DOB: [birth year] DOA: 07/26/2017  PCP: Myer Peer, MD  Admit date: 07/26/2017 Discharge date: 07/30/2017  Admitted From: Home Disposition:  Home  Discharge Condition:Stable CODE STATUS:FULL Diet recommendation: Dysphagia 1,Honey thick Liquid  Brief/Interim Summary:  Patient is a 72 year old male with past medical history significant for combined systolic/diastolic CHF, insulin-dependent diabetes mellitus, CKD stage III, history of multiple strokes who presented to the emergency department with 2-day history of generalized weakness and gait difficulty.  He was admitted in May with similar symptoms during which new areas of ischemia were noted on his MRI despite dual antiplatelet therapy.  He was discharged home on Eliquis.  Patient found with 3 new small infarcts on repeat MRI this time.  Neurology was following.  Plan is to continue Eliquis and Plavix.  Current plan is to discharge to skilled nursing facility.  He will follow-up with neurology as an outpatient.   Following problems were addressed during his hospitalization:  Acute ischemic stroke: MRI showed acute/subacute subcentimeter infarction in left frontal/parietal lobes and also in right putamen.  Patient already on Eliquis and Plavix.  No hemorrhage or mass-effect seen.  Neurology was following.  Patient had recent extensive work-up including echo.  Neurology also recommended CT chest/abdomen/pelvis in order to rule out underlying malignancy which could be responsible for increased viscosity and embolic source but the imagings did not show any significant findings. Physical therapy was initially recommending CIR but the recommendation has been changed to skilled nursing facility now.  Social worker following. Patient will follow with Neurology as an outpatient after discharge.  Coronary artery disease: Denies any chest pain.  Stable.  Continue Plavix  Hyperlipidemia:  Started on low-dose Pravachol and Zetia  .  Insulin dependent diabetes type 2: Last hemoglobin A1c 5.8.  Continue sliding-scale insulin and long-acting insulin.  Hypertension: On lisinopril and Lasix at home.  CKD stage III: At baseline.  Hold nephrotoxic agents.Check BMP in a week.  History of seizure: No seizure activity noted during this admission.  Continue Keppra  History of GERD: Continue PPI  Depression: Continue Zoloft  Chronic dysphagia:Associated with stroke. Speech therapy evaluated the patient and recommended dysphagia 1, honey thick liquid.     Discharge Diagnoses:  Principal Problem:   Weakness Active Problems:   Insulin-requiring or dependent type II diabetes mellitus ([place])   Coronary artery disease involving native coronary artery of native heart without angina pectoris   Seizures (HCC)   Chronic combined systolic and diastolic CHF (congestive heart failure) (HCC)   History of embolic stroke   CKD (chronic kidney disease), stage III (HCC)   Cerebral embolism with cerebral infarction   Ischemic stroke ([place])   Acute renal failure superimposed on stage 3 chronic kidney disease (HCC)   Ataxia   Malnutrition of moderate degree    Discharge Instructions  Discharge Instructions    Ambulatory referral to Neurology   Complete by:  As directed    Follow up with [name] at [facility] in 6-8 weeks. Too complicated for RN to follow. Thanks.   Diet - low sodium heart healthy   Complete by:  As directed    Discharge instructions   Complete by:  As directed    1) Take prescribed medication as instructed. 2) Do BMP and CBC tests in a week. 3) Follow up with neurology as an outpatient in 6 weeks.  Name and number of the provider has been attached.   Increase activity slowly   Complete  by:  As directed      Allergies as of 07/30/2017      Reactions   Statins Nausea Only   mylgia   Atorvastatin Other (See Comments)   Leg weakness   Nitrofurantoin Monohyd  Macro Hives, Rash      Medication List    TAKE these medications   acetaminophen 325 MG tablet Commonly known as:  TYLENOL Take 2 tablets (650 mg total) by mouth every 4 (four) hours as needed for headache or mild pain.   apixaban 5 MG Tabs tablet Commonly known as:  ELIQUIS Take 1 tablet (5 mg total) by mouth 2 (two) times daily.   clopidogrel 75 MG tablet Commonly known as:  PLAVIX Take 1 tablet (75 mg total) by mouth daily with breakfast.   ezetimibe 10 MG tablet Commonly known as:  ZETIA Take 1 tablet (10 mg total) by mouth daily.   fexofenadine 180 MG tablet Commonly known as:  ALLEGRA Take 180 mg by mouth daily.   furosemide 40 MG tablet Commonly known as:  LASIX TAKE 1/2 TABLET BY MOUTH DAILY   insulin detemir 100 UNIT/ML injection Commonly known as:  LEVEMIR Inject 0.35 mLs (35 Units total) into the skin daily with supper.   levETIRAcetam 500 MG tablet Commonly known as:  KEPPRA Take 1 tablet (500 mg total) by mouth 2 (two) times daily.   lisinopril 10 MG tablet Commonly known as:  PRINIVIL,ZESTRIL Take 1 tablet (10 mg total) by mouth daily.   multivitamin with minerals Tabs tablet Take 1 tablet by mouth daily.   nitroGLYCERIN 0.4 MG SL tablet Commonly known as:  NITROSTAT PLACE 1 TABLET UNDER THE TONGUE EVERY 5 MINUTES AS NEEDED FOR CHEST PAIN   pantoprazole 40 MG tablet Commonly known as:  PROTONIX Take 40 mg by mouth daily.   potassium chloride SA 20 MEQ tablet Commonly known as:  K-DUR,KLOR-CON Take 1 tablet (20 mEq total) by mouth daily.   pravastatin 20 MG tablet Commonly known as:  PRAVACHOL Take 1 tablet (20 mg total) by mouth daily at 6 PM.   sertraline 100 MG tablet Commonly known as:  ZOLOFT Take 1.5 tablets (150 mg total) by mouth daily. What changed:  how much to take       Contact information for follow-up providers    Garvin Fila, MD. Schedule an appointment as soon as possible for a visit in 6 week(s).   Specialties:   Neurology, Radiology Contact information: [address] [place] [address] [phone]            Contact information for after-discharge care    Destination    [facility] .   Service:  Skilled Nursing Contact information: [address] [place] [address] [phone]                 Allergies  Allergen Reactions  . Statins Nausea Only    mylgia  . Atorvastatin Other (See Comments)    Leg weakness  . Nitrofurantoin Monohyd Macro Hives and Rash    Consultations: Neurology  Procedures/Studies: Ct Head Wo Contrast  Result Date: 07/26/2017 CLINICAL DATA:  Increasing weakness over the past few days. Unable to walk due to weakness. EXAM: CT HEAD WITHOUT CONTRAST TECHNIQUE: Contiguous axial images were obtained from the base of the skull through the vertex without intravenous contrast. COMPARISON:  CT head 06/04/2017. MR head 06/04/2017. FINDINGS: Brain: No definite acute stroke, acute hemorrhage, mass lesion, hydrocephalus, or extra-axial  fluid. Advanced cerebral and cerebellar atrophy. Hypoattenuation of white matter, likely small vessel disease. Evolutionary change of the previously noted acute infarction to subacute/chronic appearance in the RIGHT frontal deep white matter and LEFT internal capsular regions. Continued hypodensity of the brainstem representing chronic ischemia. Vascular: Calcification of the cavernous internal carotid arteries consistent with cerebrovascular atherosclerotic disease. No signs of intracranial large vessel occlusion. Skull: Calvarium intact. Sinuses/Orbits: Clear sinuses. Other: None. IMPRESSION: Advanced atrophy and small vessel disease. No definite acute intracranial findings. Electronically Signed   By: Staci Righter M.D.   On: 07/26/2017 18:07   Ct Chest W Contrast  Result Date: 07/27/2017 CLINICAL DATA:  72 year old with personal history of bladder carcinoma. Restaging  evaluation. EXAM: CT CHEST, ABDOMEN, AND PELVIS WITH CONTRAST TECHNIQUE: Multidetector CT imaging of the chest, abdomen and pelvis was performed following the standard protocol during bolus administration of intravenous contrast. CONTRAST:  166mL OMNIPAQUE IOHEXOL 300 MG/ML IV. Oral contrast was also administered. COMPARISON:  CT abdomen and pelvis 05/17/2013, 11/04/2012. CT chest 11/02/2012. FINDINGS: Beam hardening streak artifact is present in the lower chest and upper abdomen as the patient was unable to raise the arms over the head. CT CHEST FINDINGS Cardiovascular: Heart mildly enlarged, increased in size since 2014. Three-vessel coronary artery disease with prior LAD and RIGHT coronary artery stenting. No pericardial effusion. Mild atherosclerosis involving the thoracic aorta without evidence of aneurysm. Central pulmonary arteries widely patent. Mediastinum/Nodes: No pathologically enlarged mediastinal, hilar or axillary lymph nodes. No mediastinal masses. Normal-appearing esophagus. Normal-appearing thyroid gland. Lungs/Pleura: Pleural based nodule involving the RIGHT MIDDLE LOBE adjacent to the major fissure measuring approximately 13 x 7 mm (mean 10 mm), unchanged since the 2014 CT. No new, enlarging or suspicious lung nodules. Emphysematous changes throughout both lungs with peripheral blebs involving the UPPER lobes bilaterally. No confluent airspace consolidation. No pleural effusions. Central airways patent without significant bronchial wall thickening. Musculoskeletal: Degenerative disc disease and spondylosis involving the visualized LOWER cervical spine. 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 Exaggeration of the usual thoracic kyphosis. No acute findings. No evidence of osseous metastatic disease. CT ABDOMEN PELVIS FINDINGS Hepatobiliary: Liver normal in size and appearance. Gallbladder normal in appearance without calcified gallstones. No biliary ductal dilation. Pancreas:  Atrophic. No pancreatic mass or peripancreatic edema/inflammation. Spleen: Splenomegaly, the spleen measuring approximately 11.4 x 6.4 x 15.0 cm, yielding a volume of approximately 547 mL. No focal splenic parenchymal abnormality. Adrenals/Urinary Tract: Normal appearing adrenal glands. Kidneys normal in size and appearance without focal parenchymal abnormality. No hydronephrosis. No evidence of urinary tract calculi. Urinary bladder decompressed without evidence of focal wall thickening or mass. Stomach/Bowel: Stomach decompressed and unremarkable by CT. Normal-appearing small bowel. Diverticulosis involving the cecum, descending colon and sigmoid colon without evidence of acute diverticulitis. Mobile cecum positioned in the RIGHT UPPER QUADRANT. Large amount of stool in the mildly dilated rectum. Normal-appearing short appendix in the RIGHT mid abdomen. Vascular/Lymphatic: Severe aortoiliofemoral atherosclerosis without evidence of aneurysm. No pathologic lymphadenopathy. Reproductive: Upper normal sized prostate gland for age, containing calcifications. Normal seminal vesicles. Other: Small RIGHT inguinal hernia containing fat. Musculoskeletal: Multilevel degenerative disc disease, spondylosis and facet degenerative changes throughout the lumbar spine. No acute findings. No evidence of osseous metastatic disease. IMPRESSION: CT Chest: 1. No acute cardiopulmonary disease. No evidence of metastatic disease in the thorax. 2. Stable benign subpleural RIGHT MIDDLE LOBE nodule dating back to 2010, therefore likely a benign subpleural lymph node. 3. Mild cardiomegaly. Three-vessel coronary artery disease with prior LAD and RIGHT  coronary artery stenting. CT Abdomen Pelvis: 1. No acute abnormalities involving the abdomen or pelvis. No evidence of metastatic disease in the abdomen or pelvis. 2. Colonic diverticulosis without evidence of acute diverticulitis. 3. Mild splenomegaly, with slight interval increase in splenic  size since 2015. Aortic Atherosclerosis (ICD10-I70.0) and Emphysema (ICD10-J43.9). Electronically Signed   By: Evangeline Dakin M.D.   On: 07/27/2017 14:34   Mr Brain Wo Contrast  Result Date: 07/26/2017 CLINICAL DATA:  72 y/o M; history of multiple strokes. Patient presents with 2 days of generalized weakness and gait difficulty. EXAM: MRI HEAD WITHOUT CONTRAST TECHNIQUE: Multiplanar, multiecho pulse sequences of the brain and surrounding structures were obtained without intravenous contrast. COMPARISON:  07/26/2017 CT head.  06/04/2017 MRI head. FINDINGS: Brain: Subcentimeter foci of reduced diffusion are present within the left posteromedial frontal lobe, left parietal white matter, and the right putamen compatible with acute/early subacute infarction. No associated hemorrhage or mass effect. Small chronic infarcts are present within the right cerebellar hemisphere, pons, bilateral thalami, bilateral lentiform nuclei, and bilateral caudate heads. Advanced chronic microvascular ischemic changes of white matter parenchymal volume loss of the brain. No extra-axial collection, hydrocephalus, or herniation. Small foci of susceptibility hypointensity are present scattered throughout white matter as well as in the left putamen and right medulla compatible hemosiderin deposition of chronic microhemorrhage. Vascular: Normal flow voids. Skull and upper cervical spine: Normal marrow signal. Sinuses/Orbits: Negative. Other: None. IMPRESSION: 1. Three subcentimeter foci of acute/early subacute infarction in left frontal and parietal lobes as well as the right putamen. No hemorrhage or mass effect. 2. Advanced chronic microvascular ischemic changes, volume loss, and numerous small chronic infarcts of the brain. These results will be called to the ordering clinician or representative by the Radiologist Assistant, and communication documented in the PACS or zVision Dashboard. Electronically Signed   By: Kristine Garbe M.D.   On: 07/26/2017 23:05   Ct Abdomen Pelvis W Contrast  Result Date: 07/27/2017 CLINICAL DATA:  71 year old with personal history of bladder carcinoma. Restaging evaluation. EXAM: CT CHEST, ABDOMEN, AND PELVIS WITH CONTRAST TECHNIQUE: Multidetector CT imaging of the chest, abdomen and pelvis was performed following the standard protocol during bolus administration of intravenous contrast. CONTRAST:  196mL OMNIPAQUE IOHEXOL 300 MG/ML IV. Oral contrast was also administered. COMPARISON:  CT abdomen and pelvis 05/17/2013, 11/04/2012. CT chest 11/02/2012. FINDINGS: Beam hardening streak artifact is present in the lower chest and upper abdomen as the patient was unable to raise the arms over the head. CT CHEST FINDINGS Cardiovascular: Heart mildly enlarged, increased in size since 2014. Three-vessel coronary artery disease with prior LAD and RIGHT coronary artery stenting. No pericardial effusion. Mild atherosclerosis involving the thoracic aorta without evidence of aneurysm. Central pulmonary arteries widely patent. Mediastinum/Nodes: No pathologically enlarged mediastinal, hilar or axillary lymph nodes. No mediastinal masses. Normal-appearing esophagus. Normal-appearing thyroid gland. Lungs/Pleura: Pleural based nodule involving the RIGHT MIDDLE LOBE adjacent to the major fissure measuring approximately 13 x 7 mm (mean 10 mm), unchanged since the 2014 CT. No new, enlarging or suspicious lung nodules. Emphysematous changes throughout both lungs with peripheral blebs involving the UPPER lobes bilaterally. No confluent airspace consolidation. No pleural effusions. Central airways patent without significant bronchial wall thickening. Musculoskeletal: Degenerative disc disease and spondylosis involving the visualized LOWER cervical spine. 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 Exaggeration of the usual thoracic kyphosis. No acute findings. No evidence of osseous metastatic  disease. CT ABDOMEN PELVIS FINDINGS Hepatobiliary: Liver normal in size and appearance. Gallbladder normal  in appearance without calcified gallstones. No biliary ductal dilation. Pancreas: Atrophic. No pancreatic mass or peripancreatic edema/inflammation. Spleen: Splenomegaly, the spleen measuring approximately 11.4 x 6.4 x 15.0 cm, yielding a volume of approximately 547 mL. No focal splenic parenchymal abnormality. Adrenals/Urinary Tract: Normal appearing adrenal glands. Kidneys normal in size and appearance without focal parenchymal abnormality. No hydronephrosis. No evidence of urinary tract calculi. Urinary bladder decompressed without evidence of focal wall thickening or mass. Stomach/Bowel: Stomach decompressed and unremarkable by CT. Normal-appearing small bowel. Diverticulosis involving the cecum, descending colon and sigmoid colon without evidence of acute diverticulitis. Mobile cecum positioned in the RIGHT UPPER QUADRANT. Large amount of stool in the mildly dilated rectum. Normal-appearing short appendix in the RIGHT mid abdomen. Vascular/Lymphatic: Severe aortoiliofemoral atherosclerosis without evidence of aneurysm. No pathologic lymphadenopathy. Reproductive: Upper normal sized prostate gland for age, containing calcifications. Normal seminal vesicles. Other: Small RIGHT inguinal hernia containing fat. Musculoskeletal: Multilevel degenerative disc disease, spondylosis and facet degenerative changes throughout the lumbar spine. No acute findings. No evidence of osseous metastatic disease. IMPRESSION: CT Chest: 1. No acute cardiopulmonary disease. No evidence of metastatic disease in the thorax. 2. Stable benign subpleural RIGHT MIDDLE LOBE nodule dating back to 2010, therefore likely a benign subpleural lymph node. 3. Mild cardiomegaly. Three-vessel coronary artery disease with prior LAD and RIGHT coronary artery stenting. CT Abdomen Pelvis: 1. No acute abnormalities involving the abdomen or pelvis. No  evidence of metastatic disease in the abdomen or pelvis. 2. Colonic diverticulosis without evidence of acute diverticulitis. 3. Mild splenomegaly, with slight interval increase in splenic size since 2015. Aortic Atherosclerosis (ICD10-I70.0) and Emphysema (ICD10-J43.9). Electronically Signed   By: Evangeline Dakin M.D.   On: 07/27/2017 14:34       Subjective: Patient seen and examined the bedside this morning.  Remains comfortable.  No change in his mental status since yesterday.  No acute issues or events.  Stable for discharge to skilled nursing facility today.  Discharge Exam: Vitals:   07/29/17 2341 07/30/17 0354  BP: (!) 141/54 (!) 177/64  Pulse: 63 67  Resp: 15 15  Temp: 98.2 F (36.8 C) 98 F (36.7 C)  SpO2: 94% 94%   Vitals:   07/29/17 1647 07/29/17 1936 07/29/17 2341 07/30/17 0354  BP: 136/70 (!) 148/75 (!) 141/54 (!) 177/64  Pulse: 66 68 63 67  Resp: 15 15 15 15   Temp:  98 F (36.7 C) 98.2 F (36.8 C) 98 F (36.7 C)  TempSrc:  Oral Oral Oral  SpO2: 95% 94% 94% 94%  Weight:      Height:        General: Pt is alert, awake, not in acute distress Cardiovascular: RRR, S1/S2 +, no rubs, no gallops Respiratory: CTA bilaterally, no wheezing, no rhonchi Abdominal: Soft, NT, ND, bowel sounds + Extremities: no edema, no cyanosis    The results of significant diagnostics from this hospitalization (including imaging, microbiology, ancillary and laboratory) are listed below for reference.     Microbiology: No results found for this or any previous visit (from the past 240 hour(s)).   Labs: BNP (last 3 results) No results for input(s): BNP in the last 8760 hours. Basic Metabolic Panel: Recent Labs  Lab 07/26/17 1700 07/27/17 0410 07/28/17 0415 07/29/17 0412 07/29/17 2203  NA 141 143 140 142 140  K 4.0 3.7 3.7 3.7 3.6  CL 108 109 108 108 106  CO2 24 26 24 25 25   GLUCOSE 139* 132* 119* 118* 172*  BUN 23 20 15  15  19  CREATININE 1.50* 1.28* 1.21 1.11 1.31*   CALCIUM 9.0 9.1 9.0 9.0 9.1  MG  --   --   --  2.0 2.2   Liver Function Tests: Recent Labs  Lab 07/26/17 1700 07/29/17 2203  AST 18 21  ALT 12 15  ALKPHOS 67 75  BILITOT 0.7 0.9  PROT 5.8* 5.9*  ALBUMIN 3.3* 3.4*   No results for input(s): LIPASE, AMYLASE in the last 168 hours. No results for input(s): AMMONIA in the last 168 hours. CBC: Recent Labs  Lab 07/26/17 1700 07/27/17 0410 07/28/17 0415  WBC 8.5  --  7.3  NEUTROABS 6.3  --   --   HGB 12.7*  --  13.1  HCT 38.8* 38.4 39.7  MCV 82.4  --  81.2  PLT 178  --  173   Cardiac Enzymes: Recent Labs  Lab 07/26/17 1700  CKTOTAL 36*   BNP: Invalid input(s): POCBNP CBG: Recent Labs  Lab 07/29/17 1933 07/29/17 2150 07/29/17 2359 07/30/17 0353 07/30/17 0722  GLUCAP 134* 165* 156* 117* 136*   D-Dimer No results for input(s): DDIMER in the last 72 hours. Hgb A1c No results for input(s): HGBA1C in the last 72 hours. Lipid Profile No results for input(s): CHOL, HDL, LDLCALC, TRIG, CHOLHDL, LDLDIRECT in the last 72 hours. Thyroid function studies No results for input(s): TSH, T4TOTAL, T3FREE, THYROIDAB in the last 72 hours.  Invalid input(s): FREET3 Anemia work up No results for input(s): VITAMINB12, FOLATE, FERRITIN, TIBC, IRON, RETICCTPCT in the last 72 hours. Urinalysis    Component Value Date/Time   COLORURINE YELLOW 07/26/2017 1901   APPEARANCEUR CLEAR 07/26/2017 1901   LABSPEC 1.013 07/26/2017 1901   PHURINE 5.0 07/26/2017 1901   GLUCOSEU NEGATIVE 07/26/2017 1901   HGBUR NEGATIVE 07/26/2017 1901   BILIRUBINUR NEGATIVE 07/26/2017 1901   KETONESUR NEGATIVE 07/26/2017 1901   PROTEINUR 30 (A) 07/26/2017 1901   UROBILINOGEN 0.2 12/29/2013 0040   NITRITE NEGATIVE 07/26/2017 1901   LEUKOCYTESUR NEGATIVE 07/26/2017 1901   Sepsis Labs Invalid input(s): PROCALCITONIN,  WBC,  LACTICIDVEN Microbiology No results found for this or any previous visit (from the past 240 hour(s)).  Please note: You were  cared for by a hospitalist during your hospital stay. Once you are discharged, your primary care physician will handle any further medical issues. Please note that NO REFILLS for any discharge medications will be authorized once you are discharged, as it is imperative that you return to your primary care physician (or establish a relationship with a primary care physician if you do not have one) for your post hospital discharge needs so that they can reassess your need for medications and monitor your lab values.    Time coordinating discharge: 40 minutes  SIGNED:   Shelly Coss, MD  [facility] 07/30/2017, 10:07 AM Pager [phone]  If 7PM-7AM, please contact night-coverage [email or web address] Password [email or web address]

## 2017-07-30 NOTE — Clinical Social Work Note (Signed)
Patient's daughter has accepted bed offer from [facility] [place]. They have a bed available today. CSW paged MD to notify.  Dayton Scrape, [place]

## 2017-07-30 NOTE — Progress Notes (Signed)
Patient D/C w/[facility], daughter at bedside, tol well.

## 2017-07-30 NOTE — Progress Notes (Signed)
Physical Therapy Treatment Patient Details Name: Carl Stephenson MRN: [id number] DOB: 12-Aug-1945 Today's Date: 07/30/2017    History of Present Illness Patient is a 72 y/o male presenting with generalized weakness and gait difficulty. Noncontrast head CT is negative for definite acute finding. PMH significant for chronic combined systolic/diastolic CHF, insulin-dependent diabetes mellitus, chronic kidney disease stage III, and history of multiple strokes.  Continued neuro work-up.    PT Comments    Patient continues to make gradual progress toward PT goals. Pt demonstrated increased activity tolerance, gait progression, and improved sitting balance. Continue to progress as tolerated with anticipated d/c to SNF for further skilled PT services.     Follow Up Recommendations  SNF     Equipment Recommendations  None recommended by PT    Recommendations for Other Services       Precautions / Restrictions Precautions Precautions: Fall Restrictions Weight Bearing Restrictions: No    Mobility  Bed Mobility Overal bed mobility: Needs Assistance Bed Mobility: Supine to Sit     Supine to sit: Max assist;+2 for physical assistance     General bed mobility comments: cues for sequencing and assistance to bring bilat LE and hips to EOB and to elevate trunk into sitting; pt used rail to assist in getting EOB   Transfers Overall transfer level: Needs assistance Equipment used: Rolling walker (2 wheeled) Transfers: Sit to/from [name] Sit to Stand: Mod assist;+2 physical assistance;From elevated surface         General transfer comment: cues for hand/foot positioning prior to standing; assistance needed to power up into standing and gait balance upon stand  Ambulation/Gait Ambulation/Gait assistance: Mod assist Gait Distance (Feet): 16 Feet Assistive device: Rolling walker (2 wheeled) Gait Pattern/deviations: Step-to pattern;Decreased step length -  right;Decreased step length - left;Trunk flexed     General Gait Details: step by step cues for sequencing, directional cues, and for RW management needed; assistance needed to guide RW and for balance   Stairs             Wheelchair Mobility    Modified Rankin (Stroke Patients Only)       Balance Overall balance assessment: Needs assistance Sitting-balance support: Feet supported;Bilateral upper extremity supported Sitting balance-[name]: Poor     Standing balance support: Bilateral upper extremity supported;During functional activity Standing balance-[name]: Poor                              Cognition Arousal/Alertness: Awake/alert Behavior During Therapy: Flat affect Overall Cognitive Status: Impaired/Different from baseline Area of Impairment: Attention;Following commands;Safety/judgement;Problem solving                   Current Attention Level: Sustained   Following Commands: Follows one step commands with increased time Safety/Judgement: Decreased awareness of safety;Decreased awareness of deficits   Problem Solving: Slow processing;Decreased initiation;Difficulty sequencing;Requires verbal cues;Requires tactile cues        Exercises      General Comments        Pertinent Vitals/Pain Pain Assessment: No/denies pain    Home Living                      Prior Function            PT Goals (current goals can now be found in the care plan section) Progress towards PT goals: Progressing toward goals    Frequency    Min  3X/week      PT Plan Current plan remains appropriate;Frequency needs to be updated    Co-evaluation              AM-PAC PT "6 Clicks" Daily Activity  Outcome Measure  Difficulty turning over in bed (including adjusting bedclothes, sheets and blankets)?: Unable Difficulty moving from lying on back to sitting on the side of the bed? : Unable Difficulty sitting down on and  standing up from a chair with arms (e.g., wheelchair, bedside commode, etc,.)?: Unable Help needed moving to and from a bed to chair (including a wheelchair)?: A Lot Help needed walking in hospital room?: A Lot Help needed climbing 3-5 steps with a railing? : Total 6 Click Score: 8    End of Session Equipment Utilized During Treatment: Gait belt Activity Tolerance: Patient tolerated treatment well Patient left: in chair;with call bell/phone within reach;with chair alarm set Nurse Communication: Mobility status PT Visit Diagnosis: Other abnormalities of gait and mobility (R26.89);Muscle weakness (generalized) (M62.81);Other symptoms and signs involving the nervous system (R29.898)     Time: 0900-0930 PT Time Calculation (min) (ACUTE ONLY): 30 min  Charges:  $Gait Training: 8-22 mins $Therapeutic Activity: 8-22 mins                    G Codes:       Earney Navy, PTA Pager: [phone]     Darliss Cheney 07/30/2017, 12:17 PM

## 2017-07-30 NOTE — Progress Notes (Addendum)
Report given to [facility], LPN at [facility] to assume care of patient, notified of repeat swallow eval per ST and MD recommendation, nurse will follow up. Neuro Charge RN given report, denies questions/concerns at this time.

## 2017-07-30 NOTE — Progress Notes (Signed)
Dr. Neena Rhymes notified pt had a run of VT for 10 beats up to rate of 140 bpm. Pt asleep with normal BP. Mag 2.2 K of 3.6. No new orders at this time. Dr. Kennon Holter paged as well.

## 2017-07-30 NOTE — Progress Notes (Signed)
  Speech Language Pathology Treatment: Dysphagia  Patient Details Name: Carl Stephenson MRN: [id number] DOB: 01/05/1946 Today's Date: 07/30/2017 Time: 6468-0321 SLP Time Calculation (min) (ACUTE ONLY): 17 min  Assessment / Plan / Recommendation Clinical Impression  Pt has been having waxing and waning toleration of POs since this admission and new CVAs - he is currently on restrictive diet of dysphagia 1, honey-thick liquids from spoon only.  Assisted pt with breakfast - if spoon is loaded with food, pt is able to bring to mouth.  Would encourage him to do as much of the self-feeding as possible to allow anticipation of arrival of spoon to mouth and optimize neuro readiness/preparation for swallow.  Pt requires verbal cues to swallow and attend to task; his toleration appeared generally adequate.  I do think he is appropriate for a repeat MBS given new neurological dx, current restrictive diet,  and pending D/C to facility today. D/W RN. Scheduled for 12:30 today.  HPI HPI: ALEXSANDER CAVINS is a 72 y.o. male with medical history significant for chronic combined systolic/diastolic CHF, insulin-dependent diabetes mellitus, chronic kidney disease stage III, and history of multiple strokes (May 2019), and presenting to the emergency department with 2 days of generalized weakness with gait difficulty. Patient was admitted to the hospital in May with similar symptoms, new areas of ischemia were noted on MRI brain despite dual antiplatelet therapy. He was discharged home with wife on Eliquis at that time and had been fairly stable until developing a poor appetite and generalized weakness with a past 2 days. He is typically able to ambulate with a walker, but has been unable to do this for the past 2 days. There has not been any significant cough, no diarrhea or vomiting, and the patient has not complained of anything.  MRI revealed three subcentimeter foci of acute/early subacute infarction in left frontal and  parietal lobes as well as the right putamen. No hemorrhage or mass effect, advanced chronic microvascular ischemic changes, volume loss, and numerous small chronic infarcts of the brain.       SLP Plan  MBS       Recommendations  Diet recommendations: Dysphagia 1 (puree);Honey-thick liquid Liquids provided via: Teaspoon Medication Administration: Crushed with puree                Oral Care Recommendations: Oral care before and after PO SLP Visit Diagnosis: Dysphagia, oropharyngeal phase (R13.12) Plan: MBS       GO                Juan Quam Laurice 07/30/2017, 10:08 AM

## 2017-07-30 NOTE — Plan of Care (Signed)
Patient stable, discussed POC with patient and family, denies question/concerns at this time. D/C instructions provided no questions/concerns at this time.

## 2017-07-30 NOTE — Progress Notes (Signed)
Dr. Kennon Holter notified of pt's status after two pages. No new orders. Will continue to monitor closely.

## 2017-08-04 NOTE — Telephone Encounter (Signed)
LMOM requesting call back to the [facility].  Gave direct number for return call.  Per hospital d/c notes, patient was d/c to [facility].  Carelink monitor has not updated since 07/26/17.

## 2017-08-12 ENCOUNTER — Telehealth: Payer: Self-pay

## 2017-08-12 NOTE — Telephone Encounter (Signed)
Confirmed remote transmission w/ pt wife.  Pt is in the hospital. Pt wife states that she will take the monitor to do the transmission tomorrow. I gave the pt wife instructions on how to do the transmission manually.

## 2017-08-16 ENCOUNTER — Other Ambulatory Visit: Payer: Self-pay | Admitting: *Deleted

## 2017-08-16 NOTE — Patient Outreach (Signed)
[facility]) Care Management  08/16/2017  Carl Stephenson 02-06-1945 [id number]   Met with Haze Boyden, Admissions at facility, she reports patient continues to work with therapy.  They were hoping for a few more days. She reports patient discharge plan is to go home with wife and caregivers.  Patient was in his room, he was in his w/c, he was able to give verbal consent to speak with wife.  Left a packet in the room for wife. Plan to coordinate with [facility] and will speak with wife regarding [facility] CM program services.   Royetta Crochet. Laymond Purser, RN, BSN, [place] [phone]) Business Cell  [phone]) Toll Free Office

## 2017-08-19 ENCOUNTER — Other Ambulatory Visit: Payer: Self-pay | Admitting: *Deleted

## 2017-08-19 NOTE — Patient Outreach (Signed)
[facility]) Care Management  08/19/2017  Carl Stephenson 18-Mar-1945 [id number]   Attempted to reach out to patient's spouse [name] to follow up on [name] left in patient's room.  Spouse did not answer, but was able to leave a voice mail requesting a call back.  Will await a call back from spouse.   Rutherford Limerick RN, BSN [place] Acute Care Coordinator [phone]) Business Mobile [phone]) Toll free office

## 2017-08-20 ENCOUNTER — Other Ambulatory Visit: Payer: Self-pay | Admitting: *Deleted
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
## 2017-08-24 ENCOUNTER — Telehealth: Payer: Self-pay | Admitting: Cardiology

## 2017-08-24 NOTE — Telephone Encounter (Signed)
LMOVM requesting that pt send manual transmission b/c home monitor has not updated in at least 14 days.    

## 2017-08-27 ENCOUNTER — Encounter: Payer: Self-pay | Admitting: *Deleted

## 2017-08-27 NOTE — Telephone Encounter (Signed)
LMOM requesting manual transmission. [place] phone number for questions/concerns.

## 2017-08-27 NOTE — Telephone Encounter (Signed)
Opened in error

## 2017-08-30 ENCOUNTER — Ambulatory Visit: Payer: Medicare Other | Admitting: *Deleted

## 2017-08-31 ENCOUNTER — Other Ambulatory Visit: Payer: Self-pay | Admitting: Internal Medicine

## 2017-08-31 NOTE — Progress Notes (Signed)
Carelink Summary Report / Loop Recorder 

## 2017-09-01 ENCOUNTER — Encounter: Payer: Self-pay | Admitting: Cardiology

## 2017-09-01 NOTE — Progress Notes (Signed)
Letter  

## 2017-09-02 ENCOUNTER — Other Ambulatory Visit: Payer: Self-pay | Admitting: *Deleted
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
## 2017-09-02 NOTE — Telephone Encounter (Signed)
Spoke with Belenda Cruise, patient's nurse at [facility] in [place]. She reports that patient does not have his home monitor with him, but she will ask his daughter to bring it. Gave direct DC phone number and requested that she call our office when they get the monitor so that we can force a manual transmission. Advised that monitor should be automatic after that point as long as it has adequate cell signal and remains within 52ft of patient's bed. Belenda Cruise verbalizes understanding and denies additional questions or concerns at this time.

## 2017-09-10 NOTE — Telephone Encounter (Signed)
Spoke with patient's daughter, Abigail Butts. She brought home monitor to patient's SNF last week. She will plan to force a manual transmission this evening when she goes to visit the patient. Gave transmission instructions, advised I will call back next week if not received. Abigail Butts is appreciative of call and denies additional questions or concerns at this time.

## 2017-09-15 NOTE — Telephone Encounter (Signed)
Manual transmission received, ECGs given to Dr. Lovena Le for review.

## 2017-09-16 LAB — CUP PACEART REMOTE DEVICE CHECK
Date Time Interrogation Session: 20190718023534
MDC IDC PG IMPLANT DT: 20180108

## 2017-09-17 NOTE — Telephone Encounter (Signed)
Dr. Lovena Le reviewed episodes. Continue current treatment plan (Eliquis and Plavix), episodes do not definitively show AF.

## 2017-09-20 ENCOUNTER — Other Ambulatory Visit: Payer: Self-pay | Admitting: Internal Medicine

## 2017-09-21 ENCOUNTER — Ambulatory Visit: Payer: Medicare Other | Admitting: Neurology

## 2017-09-22 ENCOUNTER — Ambulatory Visit (INDEPENDENT_AMBULATORY_CARE_PROVIDER_SITE_OTHER): Payer: Medicare Other | Admitting: Neurology

## 2017-09-22 ENCOUNTER — Encounter: Payer: Self-pay | Admitting: Neurology

## 2017-09-22 VITALS — BP 114/56 | HR 64 | Ht 66.0 in | Wt 149.0 lb

## 2017-09-22 DIAGNOSIS — F0150 Vascular dementia without behavioral disturbance: Secondary | ICD-10-CM

## 2017-09-22 DIAGNOSIS — I6330 Cerebral infarction due to thrombosis of unspecified cerebral artery: Secondary | ICD-10-CM

## 2017-09-22 NOTE — Patient Instructions (Signed)
I had a long discussion the patient and his daughter regarding his multiple recent lacunar infarcts and multi-infarct dementia. Unfortunately patient's condition has deteriorated and his requires 24-hour care and supervision. Recommend continued physical occupational therapy and present medication regime. Family needs to consider CODE STATUS and goals of care. Continue eliquis for stroke prevention and Keppra for seizures. Strict control of hypertension with blood pressure goal below 140/90, lipids with LDL cholesterol goal below 70 mg percent and diabetes with hemoglobin A1c goal below 6.5%. Follow-up in the future only if necessary and no routine scheduled appointment was made

## 2017-09-22 NOTE — Progress Notes (Signed)
[facility]  PATIENT: Carl Stephenson DOB: 1945-05-31   REASON FOR VISIT: Follow-up for history of stroke and recent stroke according to patient HISTORY FROM: Patient wife and daughter    HISTORY OF PRESENT ILLNESS:UPDATE 08/27/2018CM [name], 72 year old male returns for follow-up. He was last seen in this office by Dr. Leonie Stephenson in February for stroke and seizure. Since that time he had a admission to [facility] in July for stroke and spent a month in [facility] for rehabilitation. He is now getting physical therapy and occupational therapy at home. We do not have any information from his [place] hospitalization. He remains on Plavix and aspirin for secondary stroke prevention. He has not had further stroke or TIA symptoms since his most recent admission. He remains on Keppra for seizure prophylaxis is on Crestor for hyperlipidemia without complaints of myalgias . Blood pressure the office today 118/78. Loop recorder without evidence of atrial fibrillation. He returns for reevaluation 03/09/16 Dr. Michaele Stephenson is a 74 year caucasian male seen today for the first office follow-up visit following hospital admission for seizure and stroke in January 2018. He is accompanied by his wife and daughter. I have personally reviewed the hospital admission summary, imaging studies and evaluation.Carl Stephenson an 48 y.o.malewith no past medical history of seizure, head trauma, or febrile seizure. 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 Family and wife, patient had gotten up to go to the bathroom when suddenly he was noticed to fall down with both arms extended andlegs extended, eyes rolling back, foaming at the mouth and bleeding at the mouth. Per wife this  lasted for approximately 15 minutes and then patient was postictal and confused afterwards. EMS was called and patient was brought to the ED. On arrival to [facility] was taken to the cardiac cath lab which showed two-vessel obstructive CAD with diffuse mild LAD disease and diffuse disease in the first OM, multiple stents patent, and moderately elevated LVEDP.After discussing with family patient it was noted that  did not smoke, patient does not drink, patient did not drink on New Year's Eve, patient does not do any illicit drugs. Patient does have issues with fluid behind the eyes and now having some disconjugate gaze twitches was to see a neurologist in the future for an MRI. As stated patient has never had a seizure in the past. Currently patient is alert, he is able to come ease at the hospital but does not have any recollection of what happened. He is having some difficulty following commands secondary to postictal state. EEG was obtained which did not show any overt seizure activity however formal reading is pending . Patient  only had 1 seizure .Marland KitchenPatient was not considered for IV t-PA secondary to non-stroke presentation.  MRI scan of the brain showed 4 small punctate infarcts in the right MCA, right PCN right cerebellar territories. MRI of the brain showed posterior separation atherosclerotic moderate changes involving distal right vertebral artery only. Transthoracic echo showed normal ejection fraction with slight diminished 45-50% ejection and diffuse hypokinesis. EEG showed moderate diffuse slowing and focal slowingwith additional focal slowing over the right hemispheire. LDL cholesterol was elevated at 140. Hemoglobin A1c was 7.7. Patient was started on Keppra 500 twice daily. He states  his drinking while he has had no recurrent seizures or stroke or TIA symptoms. His meds are full physical recovery but does feel tired and sleepy a lot. He is currently on Keppra 500 twice daily. The family is  concerned and wonders if he needs to stay on it. His blood pressure is well controlled today it is 136/61. His fasting sugars yet ranging in the 140 range. Patient has been eating healthy he states his activity has not had any weight loss yet. He is on aspirin and Plavix and tolerating it well with only minor bruising and no bleeding. Patient had not been driving but is interested in driving soon.  Update 10/20/2016 : He returns for follow-up after last visit with the Carl Stephenson, nurse practitioner on 09/07/16. He is accompanied by his mother and daughter. Patient states is doing well is finished home physical and occupation therapy 2 weeks ago. He is walking with a walker he states his balance is good his had no falls. He is tolerating aspirin and Plavix well with only minor bruising and no bleeding. His blood pressure is usually well controlled and today it is elevated in office at 153/73. His sugar is doing better and lasted about an A1c was down to 6.8. He has seen his primary physician Dr. Nicki Stephenson who is considering recommending the new PCS 9 inhibitor injections for his statin intolerance. Patient is taking Zoloft 100 mg daily for depression but the family feels his depression is not yet optimally controlled. I have personally reviewed his hospitalization records from [facility] for a stroke in July 2018. MRI scan of the brain on 08/05/16 shows small acute infarct in the right paramedian pons as well as small late subacute infarct in the left parietal white matter with extensive changes of chronic small vessel disease CT angiogram of the brain showed no emergent large vessel occlusion but moderate multifocal intracranial atherosclerotic changes involving distal right vertebral artery, proximal left posterior cerebral artery and mid right posterior cerebral artery as well as from small right M1 middle cerebral artery stenosis. Update 09/22/2017 :patient is seen for follow-up after last visit a year  ago. He had multiple hospitalizations and then trimmed at recurrent left subcortical lacunar infarct in May 2019 as well as bilateral lacunar infarcts in July 2019. He is had significant cognitive decline as well as overall decline in his health. He is currently living in a nursing home. Is unable to walk even with assistance with physical therapy and after a few weeks they've stopped working with him. He requires 24-hour care and supervision. He spends most of his time in the wheelchair. His impulsive at times and tries to get up and falls down. Remains on eliquis and Plavix and has multiple bruising over his skin but fortunately no bleeding episodes. His blood pressure is well controlled and today it is 114/86. I have personally reviewed imaging for lemons from his 2 recent hospitalizations.his LDL cholesterol was elevated at 143 and pravastatin 20 mg is added Hx stroke/TIA ? 10/2010 -posterior corpus callosum on the left and felt to be a white matter lacune. ? 12/2013 -MRI left frontal and rightPICApunctate infarcts, old left thalamic and b/l BGlacunar infarcts.2 days post cardiac cath and stent placement.CTA h&n bilateral siphon and ICA proximal athero. LDL 68 and A1C 7.3. put on DAPT.TEE and loop not completed. ? 01/2016 -R brain, anterior and posterior circulationpunctateinfarcts, felt to be cardioembolic. 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 CUS neg, TT EF 45-50%, TEE small  PFO, LDL 140 and A1C 7.7. Loop placed. Pt discharged with DAPT and crestor and zetia. ? 07/2016 - R pontine, L corona radiata infarcts.CTA h/nR M1 stenosis.Admitted to [facility] hospital ? 05/2017 - Acute L basal ganglia infarct andacute/subacute R MCA/ACAx 2infarctsin setting of multiple previous cryptogenic infarcts. All infarctsfelt to beembolic secondary tounknownsource. Discussed with family, put pt on eliquis 5mg  bid and ASA 81mg  for empiric treatment  History of seizure  01/2016 during admission  - [facility] - EEG showed left focal slowing - pt on keppra   Continue keppra REVIEW OF SYSTEMS: Full 14 system review of systems performed and notable only for those listed, all others are neg:    Runny nose, light sensitivity, incontinence of bladder, memory loss, agitation, confusion and all other systems negative and all the systems negative ALLERGIES: Allergies  Allergen Reactions  . Statins Nausea Only    mylgia  . Atorvastatin Other (See Comments)    Leg weakness  . Nitrofurantoin Monohyd Macro Hives and Rash    HOME MEDICATIONS: Outpatient Medications Prior to Visit  Medication Sig Dispense Refill  . acetaminophen (TYLENOL) 325 MG tablet Take 2 tablets (650 mg total) by mouth every 4 (four) hours as needed for headache or mild pain. 30 tablet 0  . apixaban (ELIQUIS) 5 MG TABS tablet Take 1 tablet (5 mg total) by mouth 2 (two) times daily. 60 tablet 0  . clopidogrel (PLAVIX) 75 MG tablet Take 1 tablet (75 mg total) by mouth daily with breakfast. 90 tablet 3  . ezetimibe (ZETIA) 10 MG tablet Take 1 tablet (10 mg total) by mouth daily. 30 tablet 0  . fexofenadine (ALLEGRA) 180 MG tablet Take 180 mg by mouth daily.    . furosemide (LASIX) 40 MG tablet TAKE 1/2 TABLET BY MOUTH DAILY 45 tablet 2  . insulin aspart (NOVOLOG FLEXPEN) 100 UNIT/ML FlexPen     . insulin detemir (LEVEMIR) 100 UNIT/ML injection Inject 0.35 mLs (35 Units total) into the skin daily with supper. 10 mL 11  . levETIRAcetam (KEPPRA) 500 MG tablet Take 1 tablet (500 mg total) by mouth 2 (two) times daily. 60 tablet 0  . lisinopril (PRINIVIL,ZESTRIL) 10 MG tablet Take 1 tablet (10 mg total) by mouth daily. 30 tablet 0  . Multiple Vitamin (MULTIVITAMIN WITH MINERALS) TABS tablet Take 1 tablet by mouth daily.    . nitroGLYCERIN (NITROSTAT) 0.4 MG SL tablet PLACE 1 TABLET UNDER THE TONGUE EVERY 5 MINUTES AS NEEDED FOR CHEST PAIN 75 tablet 0  . omeprazole (PRILOSEC) 40 MG capsule     . pantoprazole (PROTONIX) 40 MG tablet Take  40 mg by mouth daily.      . potassium chloride SA (K-DUR,KLOR-CON) 20 MEQ tablet Take 1 tablet (20 mEq total) by mouth daily. 90 tablet 3  . pravastatin (PRAVACHOL) 20 MG tablet Take 1 tablet (20 mg total) by mouth daily at 6 PM. 30 tablet 0  . sertraline (ZOLOFT) 100 MG tablet Take 1.5 tablets (150 mg total) by mouth daily. (Patient taking differently: Take 100 mg by mouth daily. ) 90 tablet 3   Facility-Administered Medications Prior to Visit  Medication Dose Route Frequency Provider Last Rate Last Dose  . heparin infusion 2 units/mL in 0.9 % sodium chloride    Continuous PRN Martinique, Peter M, MD   1,000 mL at 01/15/16 0514  . iopamidol (ISOVUE-370) 76 % injection    PRN Martinique, Peter M, MD   105 mL at 01/15/16 0514  . lidocaine (PF) (XYLOCAINE) 1 %  injection    PRN Martinique, Peter M, MD   2 mL at 01/15/16 0514  . Radial Cocktail/Verapamil only    PRN Martinique, Peter M, MD   10 mL at 01/15/16 0451    PAST MEDICAL HISTORY: Past Medical History:  Diagnosis Date  . Acute encephalopathy admitted 12/28/2013  . Bladder tumor    Recurrent  . Bradycardia 01/16/2016  . Cancer [facility])    "had TURBT; cancer free since ~ 11/2013/MD" (12/27/2013)  . Carotid artery disease ([place])    [place] 12/15: Bilateral ICA 1-39 // [place] 1/18: Bilateral ICA 1-39  . Chronic lower back pain   . Coronary artery disease    a. Cath 12/27/2013 EF 35-40%, 80-90% prox LAD treated with balloon angioplasty and 3.0x28 mm Xience DES, LCx with anomalous origin, 50-60% stenosis in OM, 50% stenosis in PDA and PLA // b. [place] 1/18: mLAD stent ok, dLAD 80, RI stent ok, oLCx stent ok, OM1 85, oRCA stent ok, mRA 40, dRCA 40, RPDA 60/60 >> med Rx  . Diabetes mellitus type II   . GERD (gastroesophageal reflux disease)   . History of echocardiogram    Echo 12/15: Mild LVH, EF 45-50, inf HK, Gr 1 diastolic dysfunction  // Echo 1/18: Moderate LVH, EF 45-50, diffuse HK, mild LAE, RA upper limits of normal // TEE 1/18:  EF 45, diffuse HK, frequent PVCs,  trivial MR, mild LAE, normal RVSF, no LA or RA clot, + PFO   . History of hiatal hernia   . Hyperlipidemia   . Hypertension   . Inferior myocardial infarction ([place]) 1994   Inferior, PCI and Streptokinase  . Kidney stones    "passed them all" (12/27/2013)  . Obesity   . Seizure disorder as sequela of cerebrovascular accident ([place])   . Stroke [facility]) ~ 2012; 2018   multiple strokes // embolic CVA 04/979 >> admx with encephalopathy >> s/p ILR  . TIA (transient ischemic attack) "several"  . Tobacco user    Quit 1994  . Wide-complex tachycardia ([place])     PAST SURGICAL HISTORY: Past Surgical History:  Procedure Laterality Date  . CARDIAC CATHETERIZATION     "he's had a couple before today where they just looked around" (12/27/2013)  . CARDIAC CATHETERIZATION N/A 01/15/2016   Procedure: Left Heart Cath and Coronary Angiography;  Surgeon: Peter M Martinique, MD;  Location: [place] CV [facility];  Service: Cardiovascular;  Laterality: N/A;  . CORONARY ANGIOPLASTY WITH STENT PLACEMENT     "he's had 3-4 stents put in before today"   . CORONARY ANGIOPLASTY WITH STENT PLACEMENT  12/27/2013   "1"  . EP IMPLANTABLE DEVICE N/A 01/20/2016   Procedure: Loop Recorder Insertion;  Surgeon: Evans Lance, MD;  Location: [place] [facility];  Service: Cardiovascular;  Laterality: N/A;  . LEFT HEART CATHETERIZATION WITH CORONARY ANGIOGRAM N/A 10/02/2011   Procedure: LEFT HEART CATHETERIZATION WITH CORONARY ANGIOGRAM;  Surgeon: [name] Bow, MD;  Location: [facility] CATH LAB;  Service: Cardiovascular;  Laterality: N/A;  . LEFT HEART CATHETERIZATION WITH CORONARY ANGIOGRAM N/A 12/27/2013   Procedure: LEFT HEART CATHETERIZATION WITH CORONARY ANGIOGRAM;  Surgeon: Blane Ohara, MD;  Location: [facility] CATH LAB;  Service: Cardiovascular;  Laterality: N/A;  . TEE WITHOUT CARDIOVERSION N/A 01/20/2016   Procedure: TRANSESOPHAGEAL ECHOCARDIOGRAM (TEE);  Surgeon: Larey Dresser, MD;  Location: [place];  Service: Cardiovascular;   Laterality: N/A;  . TRANSURETHRAL RESECTION OF BLADDER TUMOR WITH GYRUS (TURBT-GYRUS)  "several times"    FAMILY HISTORY: Family  History  Problem Relation Age of Onset  . Coronary artery disease Other   . Heart failure Father   . Heart disease Father     SOCIAL HISTORY: Social History   Socioeconomic History  . Marital status: Married    Spouse name: Not on file  . Number of children: Not on file  . Years of education: Not on file  . Highest education level: Not on file  Occupational History  . 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 Financial resource strain: Not on file  . Food insecurity:    Worry: Not on file    Inability: Not on file  . Transportation needs:    Medical: Not on file    Non-medical: Not on file  Tobacco Use  . Smoking status: Former Smoker    Packs/day: 3.00    Years: 22.00    Pack years: 66.00    Types: Cigarettes    Last attempt to quit: 08/12/1992    Years since quitting: 25.1  . Smokeless tobacco: Never Used  Substance and Sexual Activity  . Alcohol use: No  . Drug use: No  . Sexual activity: Not on file  Lifestyle  . Physical activity:    Days per week: Not on file    Minutes per session: Not on file  . Stress: Not on file  Relationships  . Social connections:    Talks on phone: Not on file    Gets together: Not on file    Attends religious service: Not on file    Active member of club or organization: Not on file    Attends meetings of clubs or organizations: Not on file    Relationship status: Not on file  . Intimate partner violence:    Fear of current or ex partner: Not on file    Emotionally abused: Not on file    Physically abused: Not on file    Forced sexual activity: Not on file  Other Topics Concern  . Not on file  Social History Narrative   Married    2 grown children     PHYSICAL EXAM  Vitals:   09/22/17 1106  BP: (!) 114/56  Pulse: 64  Weight: 149 lb  (67.6 kg)  Height: 5\' 6"  (1.676 m)   Body mass index is 24.05 kg/m.  Generalized:  Frail elderly Caucasian Caucasian male in no acute distress  Head: normocephalic and atraumatic,.   Neck: Supple, no carotid bruits  Cardiac: Regular rate rhythm, no murmur  Musculoskeletal: No deformity Skin multiple ecchymosis bilateral forearms and hands   Neurological examination   Mentation: Alert oriented to place,   Attention span and concentration poor Recent and remote memory poor.  Follows simple commands .soft voice. Hesitant speech. Poor insight into his condition. Cranial nerve II-XII: .Pupils were equal round reactive to light extraocular movements were full, visual field were full on confrontational test. Mild left facial weakness . hearing was intact to finger rubbing bilaterally. Uvula tongue midline. head turning and shoulder shrug were normal and symmetric.Tongue protrusion into cheek strength was normal.jaw jerk is brisk. Motor: normal bulk and tone, full strength in the BUE, BLE, on the right, 4 out of 5 left upper and left lower extremity Increased tone in the left lower extremity. Diminished fine finger movements on the left. Orbits right over left upper extremity. Sensory: normal and symmetric to light touch, pinprick, and  Vibration, in the upper  and lower extremities  Coordination: finger-nose-finger, no dysmetria Reflexes: 1+ upper lower and symmetric plantar responses were flexor bilaterally. Gait and Station: deferred as patient has difficulty walking even with physical therapy. DIAGNOSTIC DATA (LABS, IMAGING, TESTING) - I reviewed patient records, labs, notes, testing and imaging myself where available.  Lab Results  Component Value Date   WBC 7.3 07/28/2017   HGB 13.1 07/28/2017   HCT 39.7 07/28/2017   MCV 81.2 07/28/2017   PLT 173 07/28/2017      Component Value Date/Time   NA 140 07/29/2017 2203   NA 142 02/09/2017 1420   K 3.6 07/29/2017 2203   CL 106 07/29/2017  2203   CO2 25 07/29/2017 2203   GLUCOSE 172 (H) 07/29/2017 2203   BUN 19 07/29/2017 2203   BUN 19 02/09/2017 1420   CREATININE 1.31 (H) 07/29/2017 2203   CREATININE 1.00 09/25/2011 1648   CALCIUM 9.1 07/29/2017 2203   PROT 5.9 (L) 07/29/2017 2203   PROT 6.0 02/09/2017 1420   ALBUMIN 3.4 (L) 07/29/2017 2203   ALBUMIN 3.7 02/09/2017 1420   AST 21 07/29/2017 2203   ALT 15 07/29/2017 2203   ALKPHOS 75 07/29/2017 2203   BILITOT 0.9 07/29/2017 2203   BILITOT 0.4 02/09/2017 1420   GFRNONAA 52 (L) 07/29/2017 2203   GFRAA >60 07/29/2017 2203   Lab Results  Component Value Date   CHOL 218 (H) 06/05/2017   HDL 28 (L) 06/05/2017   LDLCALC 143 (H) 06/05/2017   TRIG 236 (H) 06/05/2017   CHOLHDL 7.8 06/05/2017   Lab Results  Component Value Date   HGBA1C 5.8 (H) 06/05/2017   Lab Results  Component Value Date   VITAMINB12 458 07/27/2017   Lab Results  Component Value Date   TSH 1.819 07/27/2017      ASSESSMENT AND PLAN 51 year Caucasian male with small right anterior and posterior circulation embolic infarcts in January 2018 with symptomatic generalized seizure. The strokes occurred after cardiac catheterization procedure. Admission to [facility] July 4,2018 recurrent  Lacunar infarcts due to small vessel disease but he does have large vessel intracranial atherosclerosis as well.recent left subcortical infarct in May 2019 as well as bilateral subcortical recurrent lacunar infarcts in July 2019 now with resultant vascular dementia Multiple vascular risk factors of diabetes, hypertension, hyperlipidemia and s    PLAN: I had a long discussion the patient and his daughter regarding his multiple recent lacunar infarcts and multi-infarct dementia. Unfortunately patient's condition has deteriorated and his requires 24-hour care and supervision. Recommend continued physical occupational therapy and present medication regime. Family needs to consider CODE STATUS and goals of care.  Continue eliquis for stroke prevention and Keppra for seizures. Strict control of hypertension with blood pressure goal below 140/90, lipids with LDL cholesterol goal below 70 mg percent and diabetes with hemoglobin A1c goal below 6.5%. Follow-up in the future only if necessary and no routine scheduled appointment was made I spent 25 minutes in total face to face time with the patient more than 50% of which was spent counseling and coordination of care, reviewing test results reviewing medications and discussing and reviewing the diagnosis of stroke and management of risk factors and further treatment options. ,   Antony Contras, MD [facility] [address], [place], [place] [address] [phone]

## 2017-09-26 DIAGNOSIS — D689 Coagulation defect, unspecified: Secondary | ICD-10-CM | POA: Diagnosis present

## 2017-09-26 DIAGNOSIS — Z452 Encounter for adjustment and management of vascular access device: Secondary | ICD-10-CM | POA: Diagnosis not present

## 2017-09-26 DIAGNOSIS — J9601 Acute respiratory failure with hypoxia: Secondary | ICD-10-CM | POA: Diagnosis present

## 2017-09-26 DIAGNOSIS — R8290 Unspecified abnormal findings in urine: Secondary | ICD-10-CM | POA: Diagnosis not present

## 2017-09-26 DIAGNOSIS — I252 Old myocardial infarction: Secondary | ICD-10-CM | POA: Diagnosis not present

## 2017-09-26 DIAGNOSIS — I472 Ventricular tachycardia: Secondary | ICD-10-CM | POA: Diagnosis not present

## 2017-09-26 DIAGNOSIS — A419 Sepsis, unspecified organism: Secondary | ICD-10-CM | POA: Diagnosis not present

## 2017-09-26 DIAGNOSIS — R278 Other lack of coordination: Secondary | ICD-10-CM | POA: Diagnosis not present

## 2017-09-26 DIAGNOSIS — Z7401 Bed confinement status: Secondary | ICD-10-CM | POA: Diagnosis not present

## 2017-09-26 DIAGNOSIS — R7989 Other specified abnormal findings of blood chemistry: Secondary | ICD-10-CM | POA: Diagnosis not present

## 2017-09-26 DIAGNOSIS — K8000 Calculus of gallbladder with acute cholecystitis without obstruction: Secondary | ICD-10-CM | POA: Diagnosis present

## 2017-09-26 DIAGNOSIS — E860 Dehydration: Secondary | ICD-10-CM | POA: Diagnosis not present

## 2017-09-26 DIAGNOSIS — I5032 Chronic diastolic (congestive) heart failure: Secondary | ICD-10-CM | POA: Diagnosis present

## 2017-09-26 DIAGNOSIS — R0902 Hypoxemia: Secondary | ICD-10-CM | POA: Diagnosis not present

## 2017-09-26 DIAGNOSIS — I959 Hypotension, unspecified: Secondary | ICD-10-CM | POA: Diagnosis not present

## 2017-09-26 DIAGNOSIS — R001 Bradycardia, unspecified: Secondary | ICD-10-CM | POA: Diagnosis not present

## 2017-09-26 DIAGNOSIS — I69319 Unspecified symptoms and signs involving cognitive functions following cerebral infarction: Secondary | ICD-10-CM | POA: Diagnosis not present

## 2017-09-26 DIAGNOSIS — I25119 Atherosclerotic heart disease of native coronary artery with unspecified angina pectoris: Secondary | ICD-10-CM | POA: Diagnosis not present

## 2017-09-26 DIAGNOSIS — R188 Other ascites: Secondary | ICD-10-CM | POA: Diagnosis present

## 2017-09-26 DIAGNOSIS — K8080 Other cholelithiasis without obstruction: Secondary | ICD-10-CM | POA: Diagnosis not present

## 2017-09-26 DIAGNOSIS — M7989 Other specified soft tissue disorders: Secondary | ICD-10-CM | POA: Diagnosis not present

## 2017-09-26 DIAGNOSIS — G40909 Epilepsy, unspecified, not intractable, without status epilepticus: Secondary | ICD-10-CM | POA: Diagnosis present

## 2017-09-26 DIAGNOSIS — Z794 Long term (current) use of insulin: Secondary | ICD-10-CM | POA: Diagnosis not present

## 2017-09-26 DIAGNOSIS — I6389 Other cerebral infarction: Secondary | ICD-10-CM | POA: Diagnosis not present

## 2017-09-26 DIAGNOSIS — Z8673 Personal history of transient ischemic attack (TIA), and cerebral infarction without residual deficits: Secondary | ICD-10-CM | POA: Diagnosis not present

## 2017-09-26 DIAGNOSIS — R740 Nonspecific elevation of levels of transaminase and lactic acid dehydrogenase [LDH]: Secondary | ICD-10-CM | POA: Diagnosis not present

## 2017-09-26 DIAGNOSIS — K810 Acute cholecystitis: Secondary | ICD-10-CM | POA: Diagnosis not present

## 2017-09-26 DIAGNOSIS — R2681 Unsteadiness on feet: Secondary | ICD-10-CM | POA: Diagnosis not present

## 2017-09-26 DIAGNOSIS — R6521 Severe sepsis with septic shock: Secondary | ICD-10-CM | POA: Diagnosis not present

## 2017-09-26 DIAGNOSIS — K8001 Calculus of gallbladder with acute cholecystitis with obstruction: Secondary | ICD-10-CM | POA: Diagnosis not present

## 2017-09-26 DIAGNOSIS — M6281 Muscle weakness (generalized): Secondary | ICD-10-CM | POA: Diagnosis not present

## 2017-09-26 DIAGNOSIS — G9341 Metabolic encephalopathy: Secondary | ICD-10-CM | POA: Diagnosis present

## 2017-09-26 DIAGNOSIS — I110 Hypertensive heart disease with heart failure: Secondary | ICD-10-CM | POA: Diagnosis present

## 2017-09-26 DIAGNOSIS — I6340 Cerebral infarction due to embolism of unspecified cerebral artery: Secondary | ICD-10-CM | POA: Diagnosis not present

## 2017-09-26 DIAGNOSIS — I69320 Aphasia following cerebral infarction: Secondary | ICD-10-CM | POA: Diagnosis not present

## 2017-09-26 DIAGNOSIS — N179 Acute kidney failure, unspecified: Secondary | ICD-10-CM | POA: Diagnosis present

## 2017-09-26 DIAGNOSIS — J9811 Atelectasis: Secondary | ICD-10-CM | POA: Diagnosis not present

## 2017-09-26 DIAGNOSIS — I69391 Dysphagia following cerebral infarction: Secondary | ICD-10-CM | POA: Diagnosis not present

## 2017-09-26 DIAGNOSIS — R293 Abnormal posture: Secondary | ICD-10-CM | POA: Diagnosis not present

## 2017-09-26 DIAGNOSIS — R1111 Vomiting without nausea: Secondary | ICD-10-CM | POA: Diagnosis not present

## 2017-09-26 DIAGNOSIS — Z955 Presence of coronary angioplasty implant and graft: Secondary | ICD-10-CM | POA: Diagnosis not present

## 2017-09-26 DIAGNOSIS — I10 Essential (primary) hypertension: Secondary | ICD-10-CM | POA: Diagnosis not present

## 2017-09-26 DIAGNOSIS — R6520 Severe sepsis without septic shock: Secondary | ICD-10-CM | POA: Diagnosis not present

## 2017-09-26 DIAGNOSIS — R0689 Other abnormalities of breathing: Secondary | ICD-10-CM | POA: Diagnosis not present

## 2017-09-26 DIAGNOSIS — A4189 Other specified sepsis: Secondary | ICD-10-CM | POA: Diagnosis present

## 2017-09-26 DIAGNOSIS — I4891 Unspecified atrial fibrillation: Secondary | ICD-10-CM | POA: Diagnosis present

## 2017-09-26 DIAGNOSIS — R5381 Other malaise: Secondary | ICD-10-CM | POA: Diagnosis not present

## 2017-09-26 DIAGNOSIS — E119 Type 2 diabetes mellitus without complications: Secondary | ICD-10-CM | POA: Diagnosis present

## 2017-09-26 DIAGNOSIS — R1110 Vomiting, unspecified: Secondary | ICD-10-CM | POA: Diagnosis not present

## 2017-09-26 DIAGNOSIS — R404 Transient alteration of awareness: Secondary | ICD-10-CM | POA: Diagnosis not present

## 2017-09-26 DIAGNOSIS — Z7902 Long term (current) use of antithrombotics/antiplatelets: Secondary | ICD-10-CM | POA: Diagnosis not present

## 2017-09-26 DIAGNOSIS — E43 Unspecified severe protein-calorie malnutrition: Secondary | ICD-10-CM | POA: Diagnosis not present

## 2017-09-26 DIAGNOSIS — E785 Hyperlipidemia, unspecified: Secondary | ICD-10-CM | POA: Diagnosis not present

## 2017-09-26 DIAGNOSIS — I69354 Hemiplegia and hemiparesis following cerebral infarction affecting left non-dominant side: Secondary | ICD-10-CM | POA: Diagnosis not present

## 2017-09-26 DIAGNOSIS — R1310 Dysphagia, unspecified: Secondary | ICD-10-CM | POA: Diagnosis present

## 2017-09-26 DIAGNOSIS — J690 Pneumonitis due to inhalation of food and vomit: Secondary | ICD-10-CM | POA: Diagnosis not present

## 2017-09-26 DIAGNOSIS — I2510 Atherosclerotic heart disease of native coronary artery without angina pectoris: Secondary | ICD-10-CM | POA: Diagnosis not present

## 2017-09-26 DIAGNOSIS — R17 Unspecified jaundice: Secondary | ICD-10-CM | POA: Diagnosis not present

## 2017-09-27 ENCOUNTER — Other Ambulatory Visit: Payer: Self-pay | Admitting: Internal Medicine

## 2017-09-27 DIAGNOSIS — A419 Sepsis, unspecified organism: Secondary | ICD-10-CM | POA: Diagnosis not present

## 2017-09-27 DIAGNOSIS — R17 Unspecified jaundice: Secondary | ICD-10-CM | POA: Diagnosis not present

## 2017-09-27 DIAGNOSIS — Z8673 Personal history of transient ischemic attack (TIA), and cerebral infarction without residual deficits: Secondary | ICD-10-CM

## 2017-09-27 DIAGNOSIS — R8290 Unspecified abnormal findings in urine: Secondary | ICD-10-CM

## 2017-09-27 DIAGNOSIS — G9341 Metabolic encephalopathy: Secondary | ICD-10-CM

## 2017-09-27 DIAGNOSIS — J9601 Acute respiratory failure with hypoxia: Secondary | ICD-10-CM

## 2017-09-27 DIAGNOSIS — K8000 Calculus of gallbladder with acute cholecystitis without obstruction: Secondary | ICD-10-CM | POA: Diagnosis not present

## 2017-09-28 DIAGNOSIS — R17 Unspecified jaundice: Secondary | ICD-10-CM | POA: Diagnosis not present

## 2017-09-28 DIAGNOSIS — K8080 Other cholelithiasis without obstruction: Secondary | ICD-10-CM

## 2017-09-28 DIAGNOSIS — K8000 Calculus of gallbladder with acute cholecystitis without obstruction: Secondary | ICD-10-CM | POA: Diagnosis not present

## 2017-09-28 DIAGNOSIS — A419 Sepsis, unspecified organism: Secondary | ICD-10-CM | POA: Diagnosis not present

## 2017-09-29 DIAGNOSIS — K8000 Calculus of gallbladder with acute cholecystitis without obstruction: Secondary | ICD-10-CM | POA: Diagnosis not present

## 2017-09-29 DIAGNOSIS — A419 Sepsis, unspecified organism: Secondary | ICD-10-CM | POA: Diagnosis not present

## 2017-09-29 DIAGNOSIS — R17 Unspecified jaundice: Secondary | ICD-10-CM | POA: Diagnosis not present

## 2017-09-29 DIAGNOSIS — I69319 Unspecified symptoms and signs involving cognitive functions following cerebral infarction: Secondary | ICD-10-CM | POA: Diagnosis not present

## 2017-09-30 DIAGNOSIS — A419 Sepsis, unspecified organism: Secondary | ICD-10-CM | POA: Diagnosis not present

## 2017-09-30 DIAGNOSIS — I472 Ventricular tachycardia: Secondary | ICD-10-CM

## 2017-09-30 DIAGNOSIS — K8000 Calculus of gallbladder with acute cholecystitis without obstruction: Secondary | ICD-10-CM | POA: Diagnosis not present

## 2017-09-30 DIAGNOSIS — R17 Unspecified jaundice: Secondary | ICD-10-CM | POA: Diagnosis not present

## 2017-10-01 DIAGNOSIS — K8000 Calculus of gallbladder with acute cholecystitis without obstruction: Secondary | ICD-10-CM | POA: Diagnosis not present

## 2017-10-01 DIAGNOSIS — A419 Sepsis, unspecified organism: Secondary | ICD-10-CM | POA: Diagnosis not present

## 2017-10-01 DIAGNOSIS — R17 Unspecified jaundice: Secondary | ICD-10-CM | POA: Diagnosis not present

## 2017-10-03 DIAGNOSIS — K8000 Calculus of gallbladder with acute cholecystitis without obstruction: Secondary | ICD-10-CM

## 2017-10-04 ENCOUNTER — Ambulatory Visit (INDEPENDENT_AMBULATORY_CARE_PROVIDER_SITE_OTHER): Payer: Medicare Other | Admitting: *Deleted

## 2017-10-04 DIAGNOSIS — Z7401 Bed confinement status: Secondary | ICD-10-CM | POA: Diagnosis not present

## 2017-10-04 DIAGNOSIS — G9341 Metabolic encephalopathy: Secondary | ICD-10-CM | POA: Diagnosis not present

## 2017-10-04 DIAGNOSIS — E1169 Type 2 diabetes mellitus with other specified complication: Secondary | ICD-10-CM | POA: Diagnosis not present

## 2017-10-04 DIAGNOSIS — R8290 Unspecified abnormal findings in urine: Secondary | ICD-10-CM | POA: Diagnosis not present

## 2017-10-04 DIAGNOSIS — R293 Abnormal posture: Secondary | ICD-10-CM | POA: Diagnosis not present

## 2017-10-04 DIAGNOSIS — R5381 Other malaise: Secondary | ICD-10-CM | POA: Diagnosis not present

## 2017-10-04 DIAGNOSIS — E119 Type 2 diabetes mellitus without complications: Secondary | ICD-10-CM | POA: Diagnosis not present

## 2017-10-04 DIAGNOSIS — I4891 Unspecified atrial fibrillation: Secondary | ICD-10-CM | POA: Diagnosis not present

## 2017-10-04 DIAGNOSIS — I2510 Atherosclerotic heart disease of native coronary artery without angina pectoris: Secondary | ICD-10-CM | POA: Diagnosis not present

## 2017-10-04 DIAGNOSIS — R740 Nonspecific elevation of levels of transaminase and lactic acid dehydrogenase [LDH]: Secondary | ICD-10-CM | POA: Diagnosis not present

## 2017-10-04 DIAGNOSIS — I25119 Atherosclerotic heart disease of native coronary artery with unspecified angina pectoris: Secondary | ICD-10-CM | POA: Diagnosis not present

## 2017-10-04 DIAGNOSIS — E860 Dehydration: Secondary | ICD-10-CM | POA: Diagnosis not present

## 2017-10-04 DIAGNOSIS — J690 Pneumonitis due to inhalation of food and vomit: Secondary | ICD-10-CM | POA: Diagnosis not present

## 2017-10-04 DIAGNOSIS — R278 Other lack of coordination: Secondary | ICD-10-CM | POA: Diagnosis not present

## 2017-10-04 DIAGNOSIS — I6389 Other cerebral infarction: Secondary | ICD-10-CM | POA: Diagnosis not present

## 2017-10-04 DIAGNOSIS — R2681 Unsteadiness on feet: Secondary | ICD-10-CM | POA: Diagnosis not present

## 2017-10-04 DIAGNOSIS — K8000 Calculus of gallbladder with acute cholecystitis without obstruction: Secondary | ICD-10-CM | POA: Diagnosis not present

## 2017-10-04 DIAGNOSIS — N179 Acute kidney failure, unspecified: Secondary | ICD-10-CM | POA: Diagnosis not present

## 2017-10-04 DIAGNOSIS — M6281 Muscle weakness (generalized): Secondary | ICD-10-CM | POA: Diagnosis not present

## 2017-10-04 DIAGNOSIS — K8001 Calculus of gallbladder with acute cholecystitis with obstruction: Secondary | ICD-10-CM | POA: Diagnosis not present

## 2017-10-04 DIAGNOSIS — J9601 Acute respiratory failure with hypoxia: Secondary | ICD-10-CM | POA: Diagnosis not present

## 2017-10-04 DIAGNOSIS — I69354 Hemiplegia and hemiparesis following cerebral infarction affecting left non-dominant side: Secondary | ICD-10-CM | POA: Diagnosis not present

## 2017-10-04 DIAGNOSIS — I6340 Cerebral infarction due to embolism of unspecified cerebral artery: Secondary | ICD-10-CM | POA: Diagnosis not present

## 2017-10-04 DIAGNOSIS — K828 Other specified diseases of gallbladder: Secondary | ICD-10-CM | POA: Diagnosis not present

## 2017-10-04 DIAGNOSIS — R17 Unspecified jaundice: Secondary | ICD-10-CM | POA: Diagnosis not present

## 2017-10-04 DIAGNOSIS — R531 Weakness: Secondary | ICD-10-CM | POA: Diagnosis not present

## 2017-10-04 DIAGNOSIS — R110 Nausea: Secondary | ICD-10-CM | POA: Diagnosis not present

## 2017-10-04 NOTE — Progress Notes (Signed)
Carelink Summary Report / Loop Recorder 

## 2017-10-05 DIAGNOSIS — R531 Weakness: Secondary | ICD-10-CM | POA: Diagnosis not present

## 2017-10-05 DIAGNOSIS — K828 Other specified diseases of gallbladder: Secondary | ICD-10-CM | POA: Diagnosis not present

## 2017-10-05 DIAGNOSIS — E1169 Type 2 diabetes mellitus with other specified complication: Secondary | ICD-10-CM | POA: Diagnosis not present

## 2017-10-05 DIAGNOSIS — R110 Nausea: Secondary | ICD-10-CM | POA: Diagnosis not present

## 2017-10-11 ENCOUNTER — Telehealth: Payer: Self-pay | Admitting: *Deleted

## 2017-10-11 LAB — CUP PACEART REMOTE DEVICE CHECK
Date Time Interrogation Session: 20190922094206
Implantable Pulse Generator Implant Date: 20180108

## 2017-10-11 NOTE — Telephone Encounter (Signed)
Called patient's daughter to make her aware. She recalls that patient has been on Plavix for a number of years (pre-dating his CVAs). She states she was told that patient needs both Plavix and Eliquis to reduce stroke risk by a neurologist. She reports that he has had stents placed by Dr. Lia Foyer in the past. She asks if patient should remain on both Plavix and Eliquis. Advised I will route this message to Dr. Lovena Le and [name] for further recommendations.

## 2017-10-11 NOTE — Telephone Encounter (Addendum)
Received alert for "AF" episode on 09/28/17, duration 4hr 4min. Reviewed alert with Dr. Lovena Le. Per Dr. Lovena Le, plan to CONTINUE Eliquis 5mg  BID and STOP Plavix. Initially, plan was to start Toprol Xl 25mg  daily and f/u in 2-3 weeks in office. Spoke with patient's daughter, who reports that patient was admitted to [facility] at the time of episode with sepsis due to a gallbladder infection. AF was reportedly noted during that admission. Patient was started on carvedilol per daughter. She is aware of Dr. Tanna Furry recommendations. Advised I will call back after confirming med list with SNF and orders with Dr. Lovena Le.  Spoke with Curt Bears, patient's nurse at [facility] [phone]). She confirmed that patient is taking Eliquis and Plavix, as well as carvedilol 6.25mg  BID. She requests faxed order to d/c Plavix. Fax number [phone].  Confirmed orders with Dr. Lovena Le. Per Dr. Lovena Le: canceled orders for Toprol, no in-office f/u necessary at this time as patient is followed by facility MD. Order faxed to Curt Bears, called to make her aware. Fax confirmation received.

## 2017-10-12 DIAGNOSIS — Z8673 Personal history of transient ischemic attack (TIA), and cerebral infarction without residual deficits: Secondary | ICD-10-CM | POA: Diagnosis not present

## 2017-10-12 DIAGNOSIS — I491 Atrial premature depolarization: Secondary | ICD-10-CM | POA: Diagnosis not present

## 2017-10-12 DIAGNOSIS — E119 Type 2 diabetes mellitus without complications: Secondary | ICD-10-CM | POA: Diagnosis not present

## 2017-10-12 DIAGNOSIS — I959 Hypotension, unspecified: Secondary | ICD-10-CM | POA: Diagnosis not present

## 2017-10-12 DIAGNOSIS — Z85828 Personal history of other malignant neoplasm of skin: Secondary | ICD-10-CM | POA: Diagnosis not present

## 2017-10-12 DIAGNOSIS — I252 Old myocardial infarction: Secondary | ICD-10-CM | POA: Diagnosis not present

## 2017-10-12 DIAGNOSIS — I10 Essential (primary) hypertension: Secondary | ICD-10-CM | POA: Diagnosis not present

## 2017-10-12 DIAGNOSIS — Z8551 Personal history of malignant neoplasm of bladder: Secondary | ICD-10-CM | POA: Diagnosis not present

## 2017-10-12 DIAGNOSIS — R001 Bradycardia, unspecified: Secondary | ICD-10-CM | POA: Diagnosis not present

## 2017-10-12 DIAGNOSIS — I2510 Atherosclerotic heart disease of native coronary artery without angina pectoris: Secondary | ICD-10-CM | POA: Diagnosis not present

## 2017-10-12 NOTE — Telephone Encounter (Signed)
From the stroke standpoint there is no proven advantage of adding Plavix to the eliquis and agree with Dr. Lovena Le that patient should stay on eliquis alone and stop the Plavix

## 2017-10-12 NOTE — Telephone Encounter (Signed)
CVM left per DPR.  Advised family that per [name] and Dr. Lovena Le Pt should discontinue Plavix.  Left message to call office if any further questions.

## 2017-10-14 ENCOUNTER — Other Ambulatory Visit: Payer: Self-pay | Admitting: Internal Medicine

## 2017-10-21 ENCOUNTER — Ambulatory Visit: Payer: Medicare Other | Admitting: Nurse Practitioner

## 2017-10-26 ENCOUNTER — Telehealth: Payer: Self-pay | Admitting: Cardiology

## 2017-10-26 NOTE — Telephone Encounter (Signed)
LMOVM requesting that pt send manual transmission b/c home monitor has not updated in at least 14 days.    

## 2017-11-05 ENCOUNTER — Ambulatory Visit (INDEPENDENT_AMBULATORY_CARE_PROVIDER_SITE_OTHER): Payer: Medicare Other | Admitting: *Deleted

## 2017-11-05 DIAGNOSIS — I6389 Other cerebral infarction: Secondary | ICD-10-CM

## 2017-11-06 NOTE — Progress Notes (Signed)
Carelink Summary Report / Loop Recorder 

## 2017-11-08 ENCOUNTER — Encounter: Payer: Self-pay | Admitting: Cardiology

## 2017-11-12 DEATH — deceased

## 2017-11-19 LAB — CUP PACEART REMOTE DEVICE CHECK
Date Time Interrogation Session: 20191025104107
Implantable Pulse Generator Implant Date: 20180108

## 2017-11-24 ENCOUNTER — Encounter: Payer: Self-pay | Admitting: Cardiology

## 2018-01-13 ENCOUNTER — Other Ambulatory Visit: Payer: Self-pay | Admitting: Internal Medicine

## 2020-04-06 IMAGING — MR MR HEAD W/O CM
10 of 11 series · 42 of 48 positions shown · non-contrast
Comparison: CT HEAD June 04, 2017 and MRI of the head August 05, 2016

CLINICAL DATA: Weakness, bradycardia. History of seizures, carotid
artery disease, diabetes, hypertension, hyperlipidemia and cancer.

EXAM:
MRI HEAD WITHOUT CONTRAST
TECHNIQUE: Multiplanar, multiecho pulse sequences of the brain and surrounding
structures were obtained without intravenous contrast.

[Series 5: ax dwi_tracew · axial · 3.0mm · 1.50mm/px · z∈[-49,+89]mm · 8 of 80 slices shown]
[im 1/80]
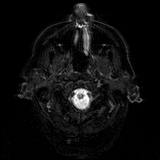
[im 12/80]
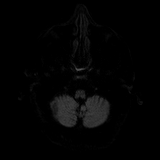
[im 23/80]
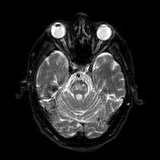
[im 34/80]
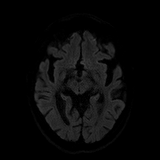
[im 46/80]
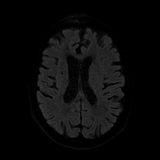
[im 57/80]
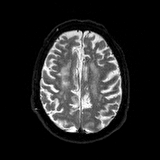
[im 68/80]
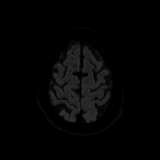
[im 80/80]
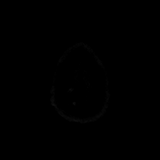

[Series 6: ax dwi_adc · axial · 3.0mm · 1.50mm/px · z∈[-49,+89]mm · 4 of 40 slices shown]
[im 1/40]
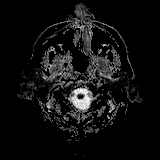
[im 14/40]
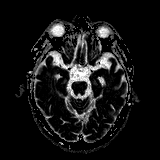
[im 27/40]
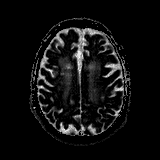
[im 40/40]
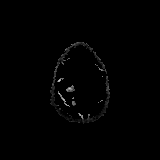

[Series 7: DWI · coronal · 4.0mm · 0.88mm/px · 7 of 70 slices shown (1 of 2)]
[im 1/70]
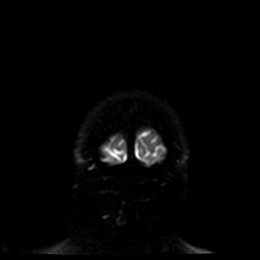
[im 12/70]
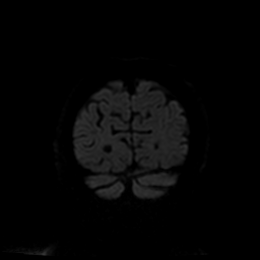
[im 24/70]
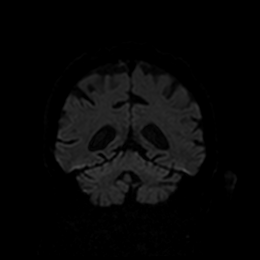
[im 35/70]
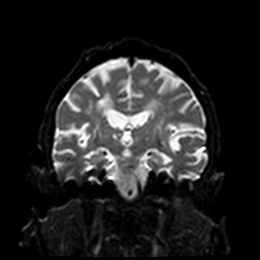
[im 47/70]
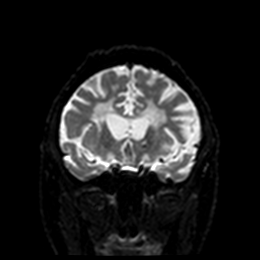
[im 58/70]
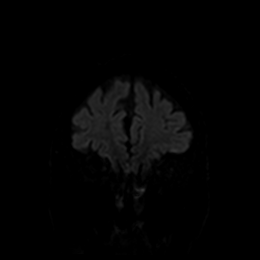
[im 70/70]
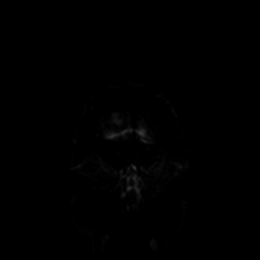

[Series 8: DWI · coronal · 4.0mm · 0.88mm/px · 3 of 35 slices shown (2 of 2)]
[im 1/35]
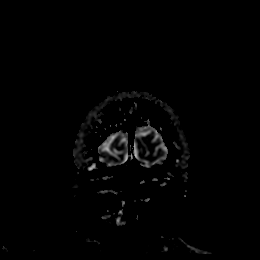
[im 18/35]
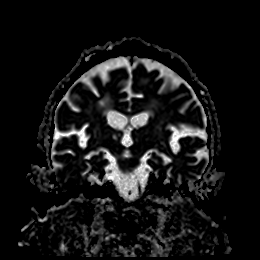
[im 35/35]
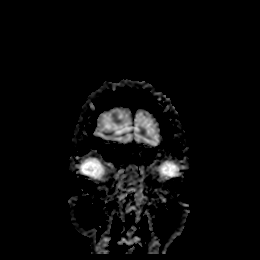

[Series 9: T1 · sagittal · 5.0mm · 0.75mm/px · 2 of 25 slices shown]
[im 1/25]
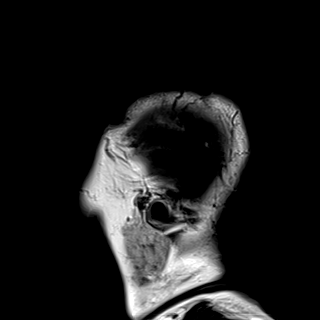
[im 25/25]
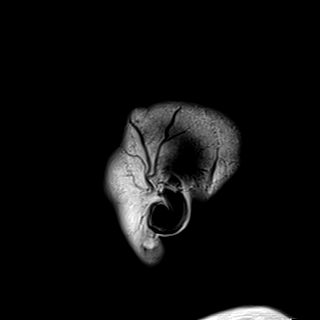

[Series 10: T2 · axial · 5.0mm · 0.69mm/px · z∈[-55,+86]mm · 2 of 25 slices shown]
[im 1/25]
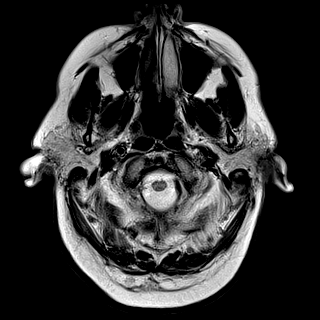
[im 25/25]
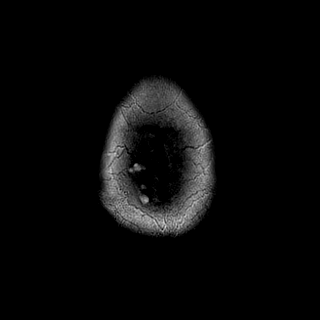

[Series 11: FLAIR · axial · 5.0mm · 0.43mm/px · z∈[-54,+87]mm · 2 of 25 slices shown]
[im 1/25]
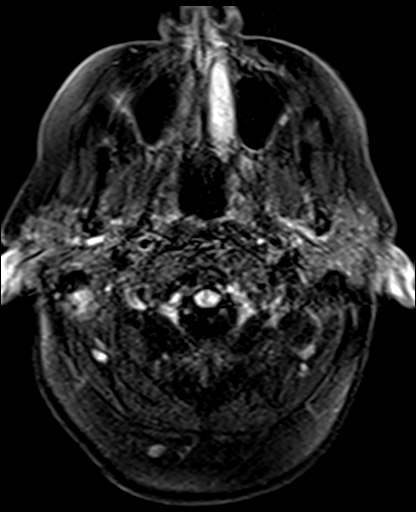
[im 25/25]
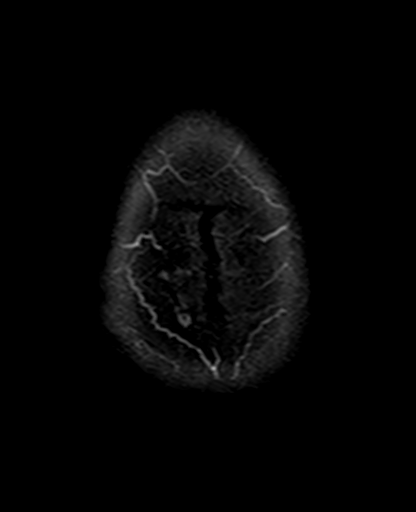

[Series 12: swi_images · axial · 3.0mm · 0.86mm/px · z∈[-64,+109]mm · 6 of 60 slices shown]
[im 1/60]
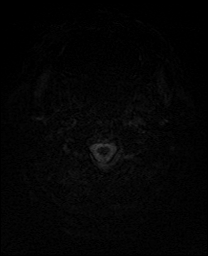
[im 12/60]
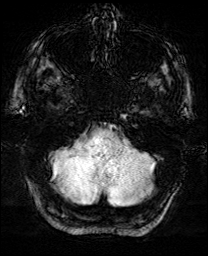
[im 24/60]
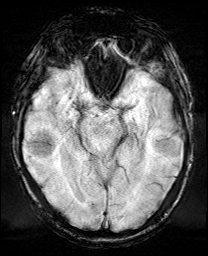
[im 36/60]
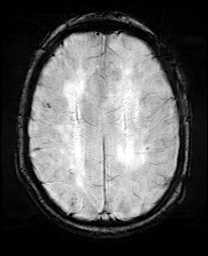
[im 48/60]
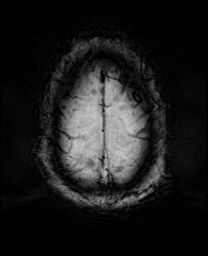
[im 60/60]
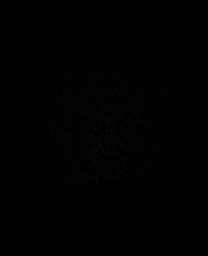

[Series 13: mip_images(sw) · axial · 24.0mm · 0.86mm/px · z∈[-54,+99]mm · 5 of 53 slices shown]
[im 1/53]
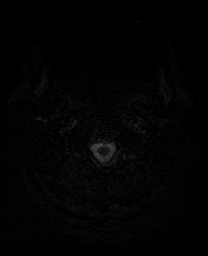
[im 14/53]
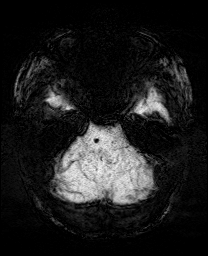
[im 27/53]
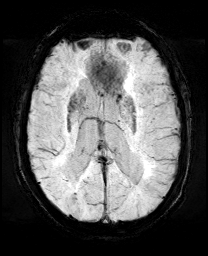
[im 40/53]
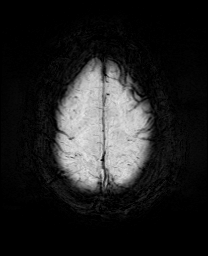
[im 53/53]
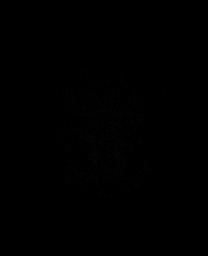

[Series 15: t2_blade_cor_p2 · coronal · 5.0mm · 0.72mm/px · 3 of 28 slices shown]
[im 1/28]
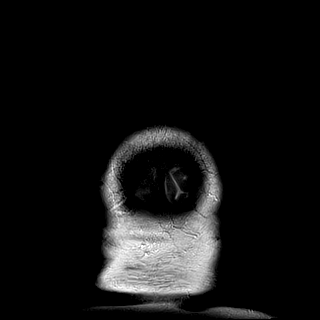
[im 14/28]
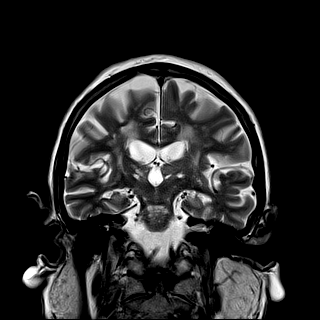
[im 28/28]
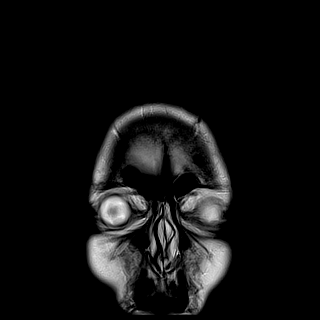

[42 of 48 positions shown; findings below may reference images not displayed]

FINDINGS: INTRACRANIAL CONTENTS: Subcentimeter reduced diffusion RIGHT
posterior frontal lobe and genu LEFT capsule with low ADC values.
Punctate RIGHT frontal reduced diffusion, difficult to confirm ADC
values due to small size.Chronic microhemorrhage RIGHT pons.
Multiple old small pontine, basal ganglia and thalami infarcts.
Confluent supratentorial pontine white matter T2 hyperintensities.
Prominent basal ganglia perivascular spaces associated with chronic
small vessel ischemic changes. No midline shift, mass effect or
masses. Moderate parenchymal brain volume loss. No hydrocephalus. No
abnormal extra-axial fluid collections.

VASCULAR: Normal major intracranial vascular flow voids present at
skull base.

SKULL AND UPPER CERVICAL SPINE: No abnormal sellar expansion. No
suspicious calvarial bone marrow signal. Craniocervical junction
maintained.

SINUSES/ORBITS: The mastoid air-cells and included paranasal sinuses
are well-aerated.The included ocular globes and orbital contents are
non-suspicious. Status post LEFT ocular lens implant.

OTHER: None.
IMPRESSION: 1. Acute subcentimeter RIGHT frontal LEFT internal capsule infarct.
Punctate RIGHT frontal acute versus subacute infarct.
2. Numerous old small supra-and infratentorial infarcts.
3. Moderate to severe chronic small vessel ischemic changes.
